# Patient Record
Sex: Male | Born: 1940
Health system: Southern US, Community
[De-identification: ages and names within clinical notes are randomized; demographics above are authoritative.]

## PROBLEM LIST (undated history)

## (undated) DIAGNOSIS — R030 Elevated blood-pressure reading, without diagnosis of hypertension: Secondary | ICD-10-CM

## (undated) DIAGNOSIS — J45909 Unspecified asthma, uncomplicated: Secondary | ICD-10-CM

## (undated) DIAGNOSIS — IMO0001 Reserved for inherently not codable concepts without codable children: Secondary | ICD-10-CM

## (undated) DIAGNOSIS — Z860100 Personal history of colon polyps, unspecified: Secondary | ICD-10-CM

## (undated) DIAGNOSIS — H409 Unspecified glaucoma: Secondary | ICD-10-CM

## (undated) DIAGNOSIS — R0600 Dyspnea, unspecified: Secondary | ICD-10-CM

## (undated) DIAGNOSIS — D72819 Decreased white blood cell count, unspecified: Secondary | ICD-10-CM

## (undated) DIAGNOSIS — Z860101 Personal history of adenomatous and serrated colon polyps: Secondary | ICD-10-CM

## (undated) DIAGNOSIS — K219 Gastro-esophageal reflux disease without esophagitis: Secondary | ICD-10-CM

## (undated) DIAGNOSIS — N400 Enlarged prostate without lower urinary tract symptoms: Secondary | ICD-10-CM

## (undated) DIAGNOSIS — E049 Nontoxic goiter, unspecified: Secondary | ICD-10-CM

## (undated) DIAGNOSIS — I10 Essential (primary) hypertension: Secondary | ICD-10-CM

## (undated) DIAGNOSIS — H269 Unspecified cataract: Secondary | ICD-10-CM

## (undated) DIAGNOSIS — D1803 Hemangioma of intra-abdominal structures: Secondary | ICD-10-CM

## (undated) HISTORY — DX: Nontoxic goiter, unspecified: E04.9

## (undated) HISTORY — DX: Personal history of colon polyps, unspecified: Z86.0100

## (undated) HISTORY — DX: Decreased white blood cell count, unspecified: D72.819

## (undated) HISTORY — DX: Unspecified cataract: H26.9

## (undated) HISTORY — PX: ESOPHAGOGASTRODUODENOSCOPY: SHX1529

## (undated) HISTORY — DX: Unspecified glaucoma: H40.9

## (undated) HISTORY — DX: Personal history of colonic polyps: Z86.010

## (undated) HISTORY — DX: Benign prostatic hyperplasia without lower urinary tract symptoms: N40.0

## (undated) HISTORY — PX: COLONOSCOPY W/ POLYPECTOMY: SHX1380

## (undated) HISTORY — DX: Gastro-esophageal reflux disease without esophagitis: K21.9

## (undated) HISTORY — DX: Elevated blood-pressure reading, without diagnosis of hypertension: R03.0

## (undated) HISTORY — DX: Reserved for inherently not codable concepts without codable children: IMO0001

---

## 1966-10-09 HISTORY — PX: THYROID SURGERY: SHX805

## 2000-10-09 HISTORY — PX: BACK SURGERY: SHX140

## 2007-10-10 HISTORY — PX: EYE SURGERY: SHX253

## 2008-10-27 ENCOUNTER — Emergency Department: Payer: Self-pay | Admitting: Emergency Medicine

## 2010-07-06 ENCOUNTER — Ambulatory Visit: Payer: Self-pay | Admitting: Unknown Physician Specialty

## 2010-07-11 LAB — PATHOLOGY REPORT

## 2011-10-11 DIAGNOSIS — N401 Enlarged prostate with lower urinary tract symptoms: Secondary | ICD-10-CM | POA: Diagnosis not present

## 2011-10-11 DIAGNOSIS — R972 Elevated prostate specific antigen [PSA]: Secondary | ICD-10-CM | POA: Diagnosis not present

## 2011-10-11 DIAGNOSIS — R310 Gross hematuria: Secondary | ICD-10-CM | POA: Diagnosis not present

## 2011-10-11 DIAGNOSIS — R339 Retention of urine, unspecified: Secondary | ICD-10-CM | POA: Diagnosis not present

## 2011-10-19 ENCOUNTER — Ambulatory Visit: Payer: Self-pay | Admitting: Urology

## 2011-10-19 DIAGNOSIS — N281 Cyst of kidney, acquired: Secondary | ICD-10-CM | POA: Diagnosis not present

## 2011-10-19 DIAGNOSIS — N400 Enlarged prostate without lower urinary tract symptoms: Secondary | ICD-10-CM | POA: Diagnosis not present

## 2011-10-19 DIAGNOSIS — R310 Gross hematuria: Secondary | ICD-10-CM | POA: Diagnosis not present

## 2011-11-20 DIAGNOSIS — K219 Gastro-esophageal reflux disease without esophagitis: Secondary | ICD-10-CM | POA: Diagnosis not present

## 2011-11-21 DIAGNOSIS — K219 Gastro-esophageal reflux disease without esophagitis: Secondary | ICD-10-CM | POA: Diagnosis not present

## 2011-11-21 DIAGNOSIS — N400 Enlarged prostate without lower urinary tract symptoms: Secondary | ICD-10-CM | POA: Diagnosis not present

## 2011-11-21 DIAGNOSIS — E049 Nontoxic goiter, unspecified: Secondary | ICD-10-CM | POA: Diagnosis not present

## 2011-11-21 DIAGNOSIS — H409 Unspecified glaucoma: Secondary | ICD-10-CM | POA: Diagnosis not present

## 2011-11-22 DIAGNOSIS — N400 Enlarged prostate without lower urinary tract symptoms: Secondary | ICD-10-CM | POA: Diagnosis not present

## 2011-11-22 DIAGNOSIS — E039 Hypothyroidism, unspecified: Secondary | ICD-10-CM | POA: Diagnosis not present

## 2011-11-27 DIAGNOSIS — H02209 Unspecified lagophthalmos unspecified eye, unspecified eyelid: Secondary | ICD-10-CM | POA: Diagnosis not present

## 2011-12-04 DIAGNOSIS — M129 Arthropathy, unspecified: Secondary | ICD-10-CM | POA: Diagnosis not present

## 2011-12-04 DIAGNOSIS — K2990 Gastroduodenitis, unspecified, without bleeding: Secondary | ICD-10-CM | POA: Diagnosis not present

## 2011-12-04 DIAGNOSIS — I10 Essential (primary) hypertension: Secondary | ICD-10-CM | POA: Diagnosis not present

## 2011-12-04 DIAGNOSIS — K2970 Gastritis, unspecified, without bleeding: Secondary | ICD-10-CM | POA: Diagnosis not present

## 2011-12-11 DIAGNOSIS — N400 Enlarged prostate without lower urinary tract symptoms: Secondary | ICD-10-CM | POA: Diagnosis not present

## 2011-12-19 DIAGNOSIS — K2940 Chronic atrophic gastritis without bleeding: Secondary | ICD-10-CM | POA: Diagnosis not present

## 2011-12-19 DIAGNOSIS — R198 Other specified symptoms and signs involving the digestive system and abdomen: Secondary | ICD-10-CM | POA: Diagnosis not present

## 2012-01-01 DIAGNOSIS — I10 Essential (primary) hypertension: Secondary | ICD-10-CM | POA: Diagnosis not present

## 2012-01-01 DIAGNOSIS — R079 Chest pain, unspecified: Secondary | ICD-10-CM | POA: Diagnosis not present

## 2012-01-03 DIAGNOSIS — H43399 Other vitreous opacities, unspecified eye: Secondary | ICD-10-CM | POA: Diagnosis not present

## 2012-01-03 DIAGNOSIS — H25099 Other age-related incipient cataract, unspecified eye: Secondary | ICD-10-CM | POA: Diagnosis not present

## 2012-01-03 DIAGNOSIS — H4011X0 Primary open-angle glaucoma, stage unspecified: Secondary | ICD-10-CM | POA: Diagnosis not present

## 2012-01-10 DIAGNOSIS — R972 Elevated prostate specific antigen [PSA]: Secondary | ICD-10-CM | POA: Diagnosis not present

## 2012-01-10 DIAGNOSIS — N138 Other obstructive and reflux uropathy: Secondary | ICD-10-CM | POA: Diagnosis not present

## 2012-01-10 DIAGNOSIS — R339 Retention of urine, unspecified: Secondary | ICD-10-CM | POA: Diagnosis not present

## 2012-01-10 DIAGNOSIS — R310 Gross hematuria: Secondary | ICD-10-CM | POA: Diagnosis not present

## 2012-04-05 DIAGNOSIS — H4010X0 Unspecified open-angle glaucoma, stage unspecified: Secondary | ICD-10-CM | POA: Diagnosis not present

## 2012-07-24 DIAGNOSIS — R339 Retention of urine, unspecified: Secondary | ICD-10-CM | POA: Insufficient documentation

## 2012-07-24 DIAGNOSIS — N529 Male erectile dysfunction, unspecified: Secondary | ICD-10-CM | POA: Insufficient documentation

## 2012-07-24 DIAGNOSIS — R972 Elevated prostate specific antigen [PSA]: Secondary | ICD-10-CM | POA: Insufficient documentation

## 2012-07-24 DIAGNOSIS — R310 Gross hematuria: Secondary | ICD-10-CM | POA: Diagnosis not present

## 2012-07-24 DIAGNOSIS — N138 Other obstructive and reflux uropathy: Secondary | ICD-10-CM | POA: Diagnosis not present

## 2012-07-24 DIAGNOSIS — N401 Enlarged prostate with lower urinary tract symptoms: Secondary | ICD-10-CM | POA: Diagnosis not present

## 2012-08-02 DIAGNOSIS — J301 Allergic rhinitis due to pollen: Secondary | ICD-10-CM | POA: Diagnosis not present

## 2012-08-02 DIAGNOSIS — H6120 Impacted cerumen, unspecified ear: Secondary | ICD-10-CM | POA: Diagnosis not present

## 2012-08-02 DIAGNOSIS — H9319 Tinnitus, unspecified ear: Secondary | ICD-10-CM | POA: Diagnosis not present

## 2012-08-30 DIAGNOSIS — H0019 Chalazion unspecified eye, unspecified eyelid: Secondary | ICD-10-CM | POA: Diagnosis not present

## 2012-09-26 DIAGNOSIS — H4010X0 Unspecified open-angle glaucoma, stage unspecified: Secondary | ICD-10-CM | POA: Diagnosis not present

## 2012-10-22 DIAGNOSIS — R12 Heartburn: Secondary | ICD-10-CM | POA: Diagnosis not present

## 2012-11-22 DIAGNOSIS — H04129 Dry eye syndrome of unspecified lacrimal gland: Secondary | ICD-10-CM | POA: Diagnosis not present

## 2012-11-25 DIAGNOSIS — N400 Enlarged prostate without lower urinary tract symptoms: Secondary | ICD-10-CM | POA: Diagnosis not present

## 2012-11-25 DIAGNOSIS — Z0000 Encounter for general adult medical examination without abnormal findings: Secondary | ICD-10-CM | POA: Diagnosis not present

## 2012-11-25 DIAGNOSIS — J392 Other diseases of pharynx: Secondary | ICD-10-CM | POA: Diagnosis not present

## 2012-11-25 DIAGNOSIS — E079 Disorder of thyroid, unspecified: Secondary | ICD-10-CM | POA: Diagnosis not present

## 2012-11-25 DIAGNOSIS — H409 Unspecified glaucoma: Secondary | ICD-10-CM | POA: Diagnosis not present

## 2012-11-25 DIAGNOSIS — D709 Neutropenia, unspecified: Secondary | ICD-10-CM | POA: Diagnosis not present

## 2012-12-04 DIAGNOSIS — L723 Sebaceous cyst: Secondary | ICD-10-CM | POA: Diagnosis not present

## 2013-01-08 DIAGNOSIS — H4011X0 Primary open-angle glaucoma, stage unspecified: Secondary | ICD-10-CM | POA: Diagnosis not present

## 2013-01-08 DIAGNOSIS — H43399 Other vitreous opacities, unspecified eye: Secondary | ICD-10-CM | POA: Diagnosis not present

## 2013-01-20 DIAGNOSIS — K120 Recurrent oral aphthae: Secondary | ICD-10-CM | POA: Diagnosis not present

## 2013-01-20 DIAGNOSIS — J301 Allergic rhinitis due to pollen: Secondary | ICD-10-CM | POA: Diagnosis not present

## 2013-01-20 DIAGNOSIS — J060 Acute laryngopharyngitis: Secondary | ICD-10-CM | POA: Diagnosis not present

## 2013-02-03 DIAGNOSIS — J060 Acute laryngopharyngitis: Secondary | ICD-10-CM | POA: Diagnosis not present

## 2013-02-03 DIAGNOSIS — J301 Allergic rhinitis due to pollen: Secondary | ICD-10-CM | POA: Diagnosis not present

## 2013-02-12 DIAGNOSIS — K219 Gastro-esophageal reflux disease without esophagitis: Secondary | ICD-10-CM | POA: Diagnosis not present

## 2013-03-04 ENCOUNTER — Ambulatory Visit: Payer: Self-pay | Admitting: Gastroenterology

## 2013-03-04 DIAGNOSIS — N400 Enlarged prostate without lower urinary tract symptoms: Secondary | ICD-10-CM | POA: Diagnosis not present

## 2013-03-04 DIAGNOSIS — H409 Unspecified glaucoma: Secondary | ICD-10-CM | POA: Diagnosis not present

## 2013-03-04 DIAGNOSIS — K2900 Acute gastritis without bleeding: Secondary | ICD-10-CM | POA: Diagnosis not present

## 2013-03-04 DIAGNOSIS — K2990 Gastroduodenitis, unspecified, without bleeding: Secondary | ICD-10-CM | POA: Diagnosis not present

## 2013-03-04 DIAGNOSIS — Z79899 Other long term (current) drug therapy: Secondary | ICD-10-CM | POA: Diagnosis not present

## 2013-03-04 DIAGNOSIS — R12 Heartburn: Secondary | ICD-10-CM | POA: Diagnosis not present

## 2013-03-04 DIAGNOSIS — K2970 Gastritis, unspecified, without bleeding: Secondary | ICD-10-CM | POA: Diagnosis not present

## 2013-03-04 DIAGNOSIS — H9190 Unspecified hearing loss, unspecified ear: Secondary | ICD-10-CM | POA: Diagnosis not present

## 2013-03-04 DIAGNOSIS — Z8249 Family history of ischemic heart disease and other diseases of the circulatory system: Secondary | ICD-10-CM | POA: Diagnosis not present

## 2013-03-04 DIAGNOSIS — Z8489 Family history of other specified conditions: Secondary | ICD-10-CM | POA: Diagnosis not present

## 2013-03-05 LAB — PATHOLOGY REPORT

## 2013-03-27 DIAGNOSIS — H4010X0 Unspecified open-angle glaucoma, stage unspecified: Secondary | ICD-10-CM | POA: Diagnosis not present

## 2013-05-08 DIAGNOSIS — D1039 Benign neoplasm of other parts of mouth: Secondary | ICD-10-CM | POA: Diagnosis not present

## 2013-05-08 DIAGNOSIS — J060 Acute laryngopharyngitis: Secondary | ICD-10-CM | POA: Diagnosis not present

## 2013-05-08 DIAGNOSIS — D367 Benign neoplasm of other specified sites: Secondary | ICD-10-CM | POA: Diagnosis not present

## 2013-05-08 DIAGNOSIS — K1370 Unspecified lesions of oral mucosa: Secondary | ICD-10-CM | POA: Diagnosis not present

## 2013-05-26 DIAGNOSIS — R220 Localized swelling, mass and lump, head: Secondary | ICD-10-CM | POA: Diagnosis not present

## 2013-05-26 DIAGNOSIS — L723 Sebaceous cyst: Secondary | ICD-10-CM | POA: Diagnosis not present

## 2013-06-02 DIAGNOSIS — D1039 Benign neoplasm of other parts of mouth: Secondary | ICD-10-CM | POA: Diagnosis not present

## 2013-06-02 DIAGNOSIS — K120 Recurrent oral aphthae: Secondary | ICD-10-CM | POA: Diagnosis not present

## 2013-06-19 DIAGNOSIS — N138 Other obstructive and reflux uropathy: Secondary | ICD-10-CM | POA: Diagnosis not present

## 2013-06-19 DIAGNOSIS — T8389XA Other specified complication of genitourinary prosthetic devices, implants and grafts, initial encounter: Secondary | ICD-10-CM | POA: Diagnosis not present

## 2013-06-19 DIAGNOSIS — R972 Elevated prostate specific antigen [PSA]: Secondary | ICD-10-CM | POA: Diagnosis not present

## 2013-06-19 DIAGNOSIS — R339 Retention of urine, unspecified: Secondary | ICD-10-CM | POA: Diagnosis not present

## 2013-06-30 DIAGNOSIS — D709 Neutropenia, unspecified: Secondary | ICD-10-CM | POA: Diagnosis not present

## 2013-06-30 DIAGNOSIS — Z23 Encounter for immunization: Secondary | ICD-10-CM | POA: Diagnosis not present

## 2013-06-30 DIAGNOSIS — W458XXA Other foreign body or object entering through skin, initial encounter: Secondary | ICD-10-CM | POA: Diagnosis not present

## 2013-06-30 DIAGNOSIS — Z0000 Encounter for general adult medical examination without abnormal findings: Secondary | ICD-10-CM | POA: Diagnosis not present

## 2013-08-07 DIAGNOSIS — H4010X0 Unspecified open-angle glaucoma, stage unspecified: Secondary | ICD-10-CM | POA: Diagnosis not present

## 2013-10-13 DIAGNOSIS — H6120 Impacted cerumen, unspecified ear: Secondary | ICD-10-CM | POA: Diagnosis not present

## 2013-10-13 DIAGNOSIS — D106 Benign neoplasm of nasopharynx: Secondary | ICD-10-CM | POA: Diagnosis not present

## 2013-11-10 DIAGNOSIS — D1039 Benign neoplasm of other parts of mouth: Secondary | ICD-10-CM | POA: Diagnosis not present

## 2014-01-21 DIAGNOSIS — H2510 Age-related nuclear cataract, unspecified eye: Secondary | ICD-10-CM | POA: Diagnosis not present

## 2014-01-21 DIAGNOSIS — H4011X0 Primary open-angle glaucoma, stage unspecified: Secondary | ICD-10-CM | POA: Diagnosis not present

## 2014-01-21 DIAGNOSIS — H43399 Other vitreous opacities, unspecified eye: Secondary | ICD-10-CM | POA: Diagnosis not present

## 2014-01-21 DIAGNOSIS — H409 Unspecified glaucoma: Secondary | ICD-10-CM | POA: Diagnosis not present

## 2014-01-29 DIAGNOSIS — D1039 Benign neoplasm of other parts of mouth: Secondary | ICD-10-CM | POA: Diagnosis not present

## 2014-01-29 DIAGNOSIS — J301 Allergic rhinitis due to pollen: Secondary | ICD-10-CM | POA: Diagnosis not present

## 2014-02-13 DIAGNOSIS — H4010X0 Unspecified open-angle glaucoma, stage unspecified: Secondary | ICD-10-CM | POA: Diagnosis not present

## 2014-02-19 DIAGNOSIS — K219 Gastro-esophageal reflux disease without esophagitis: Secondary | ICD-10-CM | POA: Diagnosis not present

## 2014-02-19 DIAGNOSIS — D1039 Benign neoplasm of other parts of mouth: Secondary | ICD-10-CM | POA: Diagnosis not present

## 2014-03-22 DIAGNOSIS — R509 Fever, unspecified: Secondary | ICD-10-CM | POA: Diagnosis not present

## 2014-03-22 DIAGNOSIS — R5381 Other malaise: Secondary | ICD-10-CM | POA: Diagnosis not present

## 2014-03-22 DIAGNOSIS — R5383 Other fatigue: Secondary | ICD-10-CM | POA: Diagnosis not present

## 2014-03-22 DIAGNOSIS — N390 Urinary tract infection, site not specified: Secondary | ICD-10-CM | POA: Diagnosis not present

## 2014-03-22 LAB — CBC WITH DIFFERENTIAL/PLATELET
Basophil #: 0 10*3/uL (ref 0.0–0.1)
Basophil %: 0.5 %
Eosinophil #: 0.1 10*3/uL (ref 0.0–0.7)
Eosinophil %: 0.9 %
HCT: 40.3 % (ref 40.0–52.0)
HGB: 13.1 g/dL (ref 13.0–18.0)
Lymphocyte #: 0.6 10*3/uL — ABNORMAL LOW (ref 1.0–3.6)
Lymphocyte %: 9.2 %
MCH: 27.5 pg (ref 26.0–34.0)
MCHC: 32.5 g/dL (ref 32.0–36.0)
MCV: 85 fL (ref 80–100)
MONOS PCT: 8.5 %
Monocyte #: 0.6 x10 3/mm (ref 0.2–1.0)
Neutrophil #: 5.7 10*3/uL (ref 1.4–6.5)
Neutrophil %: 80.9 %
Platelet: 130 10*3/uL — ABNORMAL LOW (ref 150–440)
RBC: 4.77 10*6/uL (ref 4.40–5.90)
RDW: 15 % — ABNORMAL HIGH (ref 11.5–14.5)
WBC: 7 10*3/uL (ref 3.8–10.6)

## 2014-03-22 LAB — BASIC METABOLIC PANEL
Anion Gap: 5 — ABNORMAL LOW (ref 7–16)
BUN: 17 mg/dL (ref 7–18)
CALCIUM: 9.5 mg/dL (ref 8.5–10.1)
CHLORIDE: 103 mmol/L (ref 98–107)
Co2: 27 mmol/L (ref 21–32)
Creatinine: 1.56 mg/dL — ABNORMAL HIGH (ref 0.60–1.30)
EGFR (African American): 51 — ABNORMAL LOW
EGFR (Non-African Amer.): 44 — ABNORMAL LOW
GLUCOSE: 103 mg/dL — AB (ref 65–99)
Osmolality: 272 (ref 275–301)
Potassium: 3.8 mmol/L (ref 3.5–5.1)
Sodium: 135 mmol/L — ABNORMAL LOW (ref 136–145)

## 2014-03-22 LAB — TROPONIN I

## 2014-03-22 LAB — URINALYSIS, COMPLETE
Bilirubin,UR: NEGATIVE
Glucose,UR: NEGATIVE mg/dL (ref 0–75)
NITRITE: POSITIVE
Ph: 5 (ref 4.5–8.0)
Protein: NEGATIVE
RBC,UR: 2 /HPF (ref 0–5)
Specific Gravity: 1.016 (ref 1.003–1.030)
Squamous Epithelial: NONE SEEN

## 2014-03-23 ENCOUNTER — Inpatient Hospital Stay: Payer: Self-pay | Admitting: Internal Medicine

## 2014-03-23 DIAGNOSIS — N400 Enlarged prostate without lower urinary tract symptoms: Secondary | ICD-10-CM | POA: Diagnosis not present

## 2014-03-23 DIAGNOSIS — A419 Sepsis, unspecified organism: Secondary | ICD-10-CM | POA: Diagnosis present

## 2014-03-23 DIAGNOSIS — R5383 Other fatigue: Secondary | ICD-10-CM | POA: Diagnosis not present

## 2014-03-23 DIAGNOSIS — R5381 Other malaise: Secondary | ICD-10-CM | POA: Diagnosis not present

## 2014-03-23 DIAGNOSIS — N390 Urinary tract infection, site not specified: Secondary | ICD-10-CM | POA: Diagnosis present

## 2014-03-23 DIAGNOSIS — K219 Gastro-esophageal reflux disease without esophagitis: Secondary | ICD-10-CM | POA: Diagnosis not present

## 2014-03-23 DIAGNOSIS — H409 Unspecified glaucoma: Secondary | ICD-10-CM | POA: Diagnosis not present

## 2014-03-23 LAB — HEPATIC FUNCTION PANEL A (ARMC)
ALBUMIN: 3.7 g/dL (ref 3.4–5.0)
ALK PHOS: 57 U/L
ALT: 30 U/L (ref 12–78)
Bilirubin, Direct: 0.1 mg/dL (ref 0.00–0.20)
Bilirubin,Total: 0.6 mg/dL (ref 0.2–1.0)
SGOT(AST): 43 U/L — ABNORMAL HIGH (ref 15–37)
Total Protein: 7.2 g/dL (ref 6.4–8.2)

## 2014-03-25 LAB — URINE CULTURE

## 2014-03-28 LAB — CULTURE, BLOOD (SINGLE)

## 2014-04-15 DIAGNOSIS — N390 Urinary tract infection, site not specified: Secondary | ICD-10-CM | POA: Diagnosis not present

## 2014-04-15 DIAGNOSIS — R300 Dysuria: Secondary | ICD-10-CM | POA: Diagnosis not present

## 2014-04-16 ENCOUNTER — Emergency Department: Payer: Self-pay | Admitting: Emergency Medicine

## 2014-04-16 DIAGNOSIS — R319 Hematuria, unspecified: Secondary | ICD-10-CM | POA: Diagnosis not present

## 2014-04-16 DIAGNOSIS — Z79899 Other long term (current) drug therapy: Secondary | ICD-10-CM | POA: Diagnosis not present

## 2014-04-16 LAB — COMPREHENSIVE METABOLIC PANEL
ALK PHOS: 62 U/L
ALT: 38 U/L (ref 12–78)
Albumin: 3.5 g/dL (ref 3.4–5.0)
Anion Gap: 9 (ref 7–16)
BUN: 16 mg/dL (ref 7–18)
Bilirubin,Total: 1.2 mg/dL — ABNORMAL HIGH (ref 0.2–1.0)
CALCIUM: 9.8 mg/dL (ref 8.5–10.1)
CO2: 26 mmol/L (ref 21–32)
Chloride: 95 mmol/L — ABNORMAL LOW (ref 98–107)
Creatinine: 1.6 mg/dL — ABNORMAL HIGH (ref 0.60–1.30)
EGFR (Non-African Amer.): 42 — ABNORMAL LOW
GFR CALC AF AMER: 49 — AB
GLUCOSE: 117 mg/dL — AB (ref 65–99)
Osmolality: 263 (ref 275–301)
Potassium: 3.8 mmol/L (ref 3.5–5.1)
SGOT(AST): 42 U/L — ABNORMAL HIGH (ref 15–37)
Sodium: 130 mmol/L — ABNORMAL LOW (ref 136–145)
TOTAL PROTEIN: 7.3 g/dL (ref 6.4–8.2)

## 2014-04-16 LAB — URINALYSIS, COMPLETE
BACTERIA: NONE SEEN
Bilirubin,UR: NEGATIVE
Glucose,UR: NEGATIVE mg/dL (ref 0–75)
KETONE: NEGATIVE
LEUKOCYTE ESTERASE: NEGATIVE
Nitrite: NEGATIVE
PH: 6 (ref 4.5–8.0)
Protein: NEGATIVE
RBC,UR: 1 /HPF (ref 0–5)
SQUAMOUS EPITHELIAL: NONE SEEN
Specific Gravity: 1.002 (ref 1.003–1.030)

## 2014-04-16 LAB — CBC
HCT: 39.8 % — ABNORMAL LOW (ref 40.0–52.0)
HGB: 12.8 g/dL — AB (ref 13.0–18.0)
MCH: 26.9 pg (ref 26.0–34.0)
MCHC: 32.2 g/dL (ref 32.0–36.0)
MCV: 84 fL (ref 80–100)
Platelet: 91 10*3/uL — ABNORMAL LOW (ref 150–440)
RBC: 4.76 10*6/uL (ref 4.40–5.90)
RDW: 15.1 % — AB (ref 11.5–14.5)
WBC: 5.3 10*3/uL (ref 3.8–10.6)

## 2014-04-18 LAB — URINE CULTURE

## 2014-04-22 DIAGNOSIS — N401 Enlarged prostate with lower urinary tract symptoms: Secondary | ICD-10-CM | POA: Diagnosis not present

## 2014-04-22 DIAGNOSIS — R310 Gross hematuria: Secondary | ICD-10-CM | POA: Diagnosis not present

## 2014-04-22 DIAGNOSIS — N138 Other obstructive and reflux uropathy: Secondary | ICD-10-CM | POA: Diagnosis not present

## 2014-04-22 DIAGNOSIS — N390 Urinary tract infection, site not specified: Secondary | ICD-10-CM | POA: Diagnosis not present

## 2014-04-22 DIAGNOSIS — R339 Retention of urine, unspecified: Secondary | ICD-10-CM | POA: Diagnosis not present

## 2014-05-13 DIAGNOSIS — S90569A Insect bite (nonvenomous), unspecified ankle, initial encounter: Secondary | ICD-10-CM | POA: Diagnosis not present

## 2014-05-13 DIAGNOSIS — I10 Essential (primary) hypertension: Secondary | ICD-10-CM | POA: Diagnosis not present

## 2014-05-13 DIAGNOSIS — W57XXXA Bitten or stung by nonvenomous insect and other nonvenomous arthropods, initial encounter: Secondary | ICD-10-CM | POA: Diagnosis not present

## 2014-05-13 DIAGNOSIS — Y929 Unspecified place or not applicable: Secondary | ICD-10-CM | POA: Diagnosis not present

## 2014-05-19 DIAGNOSIS — R030 Elevated blood-pressure reading, without diagnosis of hypertension: Secondary | ICD-10-CM | POA: Diagnosis not present

## 2014-06-10 DIAGNOSIS — K2100 Gastro-esophageal reflux disease with esophagitis, without bleeding: Secondary | ICD-10-CM | POA: Diagnosis not present

## 2014-06-10 DIAGNOSIS — K5900 Constipation, unspecified: Secondary | ICD-10-CM | POA: Diagnosis not present

## 2014-06-10 DIAGNOSIS — D126 Benign neoplasm of colon, unspecified: Secondary | ICD-10-CM | POA: Diagnosis not present

## 2014-06-10 DIAGNOSIS — Z86010 Personal history of colonic polyps: Secondary | ICD-10-CM | POA: Diagnosis not present

## 2014-06-24 ENCOUNTER — Encounter (INDEPENDENT_AMBULATORY_CARE_PROVIDER_SITE_OTHER): Payer: Self-pay

## 2014-06-24 ENCOUNTER — Encounter: Payer: Self-pay | Admitting: Internal Medicine

## 2014-06-24 ENCOUNTER — Ambulatory Visit (INDEPENDENT_AMBULATORY_CARE_PROVIDER_SITE_OTHER): Payer: Medicare Other | Admitting: Internal Medicine

## 2014-06-24 VITALS — BP 110/80 | HR 53 | Temp 97.9°F | Ht 70.0 in | Wt 162.2 lb

## 2014-06-24 DIAGNOSIS — R030 Elevated blood-pressure reading, without diagnosis of hypertension: Secondary | ICD-10-CM

## 2014-06-24 DIAGNOSIS — K219 Gastro-esophageal reflux disease without esophagitis: Secondary | ICD-10-CM | POA: Diagnosis not present

## 2014-06-24 DIAGNOSIS — Z86010 Personal history of colonic polyps: Secondary | ICD-10-CM | POA: Diagnosis not present

## 2014-06-24 DIAGNOSIS — E049 Nontoxic goiter, unspecified: Secondary | ICD-10-CM | POA: Diagnosis not present

## 2014-06-24 DIAGNOSIS — D72819 Decreased white blood cell count, unspecified: Secondary | ICD-10-CM

## 2014-06-24 DIAGNOSIS — K5900 Constipation, unspecified: Secondary | ICD-10-CM

## 2014-06-24 DIAGNOSIS — IMO0001 Reserved for inherently not codable concepts without codable children: Secondary | ICD-10-CM

## 2014-06-24 DIAGNOSIS — N400 Enlarged prostate without lower urinary tract symptoms: Secondary | ICD-10-CM | POA: Diagnosis not present

## 2014-06-24 NOTE — Progress Notes (Signed)
Pre visit review using our clinic review tool, if applicable. No additional management support is needed unless otherwise documented below in the visit note. 

## 2014-06-26 DIAGNOSIS — R972 Elevated prostate specific antigen [PSA]: Secondary | ICD-10-CM | POA: Diagnosis not present

## 2014-06-26 DIAGNOSIS — N139 Obstructive and reflux uropathy, unspecified: Secondary | ICD-10-CM | POA: Diagnosis not present

## 2014-06-26 DIAGNOSIS — Z79899 Other long term (current) drug therapy: Secondary | ICD-10-CM | POA: Diagnosis not present

## 2014-06-26 DIAGNOSIS — N138 Other obstructive and reflux uropathy: Secondary | ICD-10-CM | POA: Diagnosis not present

## 2014-06-26 DIAGNOSIS — Z86010 Personal history of colonic polyps: Secondary | ICD-10-CM | POA: Insufficient documentation

## 2014-06-26 DIAGNOSIS — E119 Type 2 diabetes mellitus without complications: Secondary | ICD-10-CM | POA: Diagnosis not present

## 2014-06-26 DIAGNOSIS — N401 Enlarged prostate with lower urinary tract symptoms: Secondary | ICD-10-CM | POA: Diagnosis not present

## 2014-06-26 DIAGNOSIS — K289 Gastrojejunal ulcer, unspecified as acute or chronic, without hemorrhage or perforation: Secondary | ICD-10-CM

## 2014-06-26 DIAGNOSIS — M545 Low back pain, unspecified: Secondary | ICD-10-CM | POA: Diagnosis not present

## 2014-06-26 DIAGNOSIS — R339 Retention of urine, unspecified: Secondary | ICD-10-CM | POA: Diagnosis not present

## 2014-06-26 DIAGNOSIS — R310 Gross hematuria: Secondary | ICD-10-CM | POA: Diagnosis not present

## 2014-06-26 DIAGNOSIS — B9681 Helicobacter pylori [H. pylori] as the cause of diseases classified elsewhere: Secondary | ICD-10-CM | POA: Insufficient documentation

## 2014-06-28 ENCOUNTER — Encounter: Payer: Self-pay | Admitting: Internal Medicine

## 2014-06-28 DIAGNOSIS — K5900 Constipation, unspecified: Secondary | ICD-10-CM | POA: Insufficient documentation

## 2014-06-28 DIAGNOSIS — N400 Enlarged prostate without lower urinary tract symptoms: Secondary | ICD-10-CM | POA: Insufficient documentation

## 2014-06-28 DIAGNOSIS — D72819 Decreased white blood cell count, unspecified: Secondary | ICD-10-CM | POA: Insufficient documentation

## 2014-06-28 DIAGNOSIS — I10 Essential (primary) hypertension: Secondary | ICD-10-CM | POA: Insufficient documentation

## 2014-06-28 DIAGNOSIS — E049 Nontoxic goiter, unspecified: Secondary | ICD-10-CM | POA: Insufficient documentation

## 2014-06-28 DIAGNOSIS — K219 Gastro-esophageal reflux disease without esophagitis: Secondary | ICD-10-CM | POA: Insufficient documentation

## 2014-06-28 DIAGNOSIS — Z86010 Personal history of colonic polyps: Secondary | ICD-10-CM | POA: Insufficient documentation

## 2014-06-28 NOTE — Assessment & Plan Note (Signed)
Worked up by hematology.  Had bone marrow, etc.  W/up unrevealing.  Follow.

## 2014-06-28 NOTE — Progress Notes (Signed)
Subjective:    Patient ID: Jose Perry, male    DOB: 07/18/1941, 73 y.o.   MRN: [id number]  HPI 73 year old male with past history of GERD, leukopenia, colon polyps, enlarged prostate and thyroid goiter.  He comes in today to follow up on these issues as well as to establish care.  States moved here 5 years ago.  Has been seen at [facility].  Had a thyroid goiter.  Had 80% of thyroid removed - 30 years ago.  Was on thyroid replacement for 15 years and felt worse.  Labs checked and found to be not needed.  Blood pressure borderline previously.  Has exercised and adjusted diet (decreased salt intake).  Blood pressure doing better after this change on no meds.  Has a history of low white blood cell count.  Worked up by hematology.  Had extensive w/up including bone marrow.  Has had two uti's recently.  Was on abx.  Was constipated.  Laxative not working.  Saw Dawson Bills.  Recommended yogurt and fiber.  This has helped.  Bowels better.  Planning for colonoscopy at the end of this month.  Has had a few scopes.  Has a history of polyps.  Sees urology for his enlarged prostate.  He has been following his blood pressure.  Blood pressure in the am 140/75 and pm blood pressure 137/71-75.  He exercises daily.  Overall feels he is doing well.     Past Medical History  Diagnosis Date  . GERD (gastroesophageal reflux disease)   . Glaucoma   . Thyroid goiter   . Hx of colonic polyps   . Enlarged prostate   . Elevated blood pressure   . Leukopenia     has had an extensive w/up.      Outpatient Encounter Prescriptions as of 06/24/2014  Medication Sig  . bimatoprost (LUMIGAN) 0.03 % ophthalmic solution Place 1 drop into the right eye at bedtime.   Marland Kitchen omeprazole (PRILOSEC) 40 MG capsule Take 40 mg by mouth daily.  Marland Kitchen TAMSULOSIN HCL PO Take 0.4 mg by mouth daily.     Review of Systems Patient denies any headache, lightheadedness or dizziness.  No sinus or allergy issues.  No chest pain, tightness or  palpatations.  No increased shortness of breath, cough or congestion.  No nausea or vomiting.  No acid reflux.  No abdominal pain or cramping.  No bowel change, such as diarrhea, constipation, BRBPR or melana now.  Bowels better.  No urine change.  Exercises regularly.  Blood pressure as outlined.  Has been worked up by hematology for low wbc's.        Objective:   Physical Exam Filed Vitals:   06/24/14 1003  BP: 110/80  Pulse: 53  Temp: 97.9 F (36.6 C)   Blood pressure recheck:  17/44  73 year old male in no acute distress.  HEENT:  Nares - clear.  Oropharynx - without lesions. NECK:  Supple.  Nontender.  No audible carotid bruit.  HEART:  Appears to be regular.   LUNGS:  No crackles or wheezing audible.  Respirations even and unlabored.   RADIAL PULSE:  Equal bilaterally.  ABDOMEN:  Soft.  Nontender.  Bowel sounds present and normal.  No audible abdominal bruit.    EXTREMITIES:  No increased edema present.  DP pulses palpable and equal bilaterally.      Assessment & Plan:  HEALTH MAINTENANCE.  Schedule him for a physical.  Has had colonoscopy.  Is followed by  Dr Tiffany Kocher.  Obtain records.  Prostate and psa followed by urology.    I spent 45 minutes with the patient and more than 50% of the time was spent in consultation regarding the above.

## 2014-06-28 NOTE — Assessment & Plan Note (Signed)
Had 80% of thyroid removed.  On no medications.  Follow tsh.

## 2014-06-28 NOTE — Assessment & Plan Note (Signed)
Followed by urology.  Currently doing well.

## 2014-06-28 NOTE — Assessment & Plan Note (Signed)
Reflux controlled on no medications.  Follow.

## 2014-06-28 NOTE — Assessment & Plan Note (Addendum)
Blood pressure as outlined.  Elevated.  Have him spot check his pressure.  Continue diet adjustment and exercise.  Follow.  Get him back in soon to reassess.

## 2014-06-28 NOTE — Assessment & Plan Note (Signed)
Better since starting fiber and eating yogurt.  Planning for colonoscopy in 9/15.

## 2014-06-28 NOTE — Assessment & Plan Note (Signed)
Followed by Dr Tiffany Kocher.  Up to date.

## 2014-07-06 ENCOUNTER — Telehealth: Payer: Self-pay | Admitting: *Deleted

## 2014-07-06 ENCOUNTER — Other Ambulatory Visit: Payer: Self-pay | Admitting: Internal Medicine

## 2014-07-06 NOTE — Telephone Encounter (Signed)
Pt states that he dropped off BP readings on Friday. He is scheduled for a colonoscopy on Wed. morning & was told that if his BP was high the morning of, they would cancel the procedure. Pt wants to know if you feels that he should go ahead & cancel now based on his BP readings. He states that his BP is always high in the mornings. This mornings reading was: 156/91 & today @ 2pm: 134/75. Please advise

## 2014-07-06 NOTE — Telephone Encounter (Signed)
Pt.notified

## 2014-07-06 NOTE — Telephone Encounter (Signed)
I spoke to GI.  I reviewed blood pressures with them  If this is the highest, this should not keep him from having his colonoscopy.  They will monitor his blood pressure.  They did ask if he would bring a list of his readings (like he gave to me) - to the appt.  Let me know if any other questions.

## 2014-07-08 ENCOUNTER — Ambulatory Visit: Payer: Self-pay | Admitting: Unknown Physician Specialty

## 2014-07-08 DIAGNOSIS — N400 Enlarged prostate without lower urinary tract symptoms: Secondary | ICD-10-CM | POA: Diagnosis not present

## 2014-07-08 DIAGNOSIS — K5900 Constipation, unspecified: Secondary | ICD-10-CM | POA: Diagnosis not present

## 2014-07-08 DIAGNOSIS — D126 Benign neoplasm of colon, unspecified: Secondary | ICD-10-CM | POA: Diagnosis not present

## 2014-07-08 DIAGNOSIS — Z860100 Personal history of colon polyps, unspecified: Secondary | ICD-10-CM | POA: Diagnosis not present

## 2014-07-08 DIAGNOSIS — R198 Other specified symptoms and signs involving the digestive system and abdomen: Secondary | ICD-10-CM | POA: Diagnosis not present

## 2014-07-08 DIAGNOSIS — H409 Unspecified glaucoma: Secondary | ICD-10-CM | POA: Diagnosis not present

## 2014-07-08 DIAGNOSIS — K648 Other hemorrhoids: Secondary | ICD-10-CM | POA: Diagnosis not present

## 2014-07-08 DIAGNOSIS — Z09 Encounter for follow-up examination after completed treatment for conditions other than malignant neoplasm: Secondary | ICD-10-CM | POA: Diagnosis not present

## 2014-07-08 DIAGNOSIS — K5730 Diverticulosis of large intestine without perforation or abscess without bleeding: Secondary | ICD-10-CM | POA: Diagnosis not present

## 2014-07-08 LAB — HM COLONOSCOPY: HM Colonoscopy: 2

## 2014-07-11 LAB — PATHOLOGY REPORT

## 2014-07-15 ENCOUNTER — Encounter: Payer: Self-pay | Admitting: Internal Medicine

## 2014-08-06 ENCOUNTER — Ambulatory Visit (INDEPENDENT_AMBULATORY_CARE_PROVIDER_SITE_OTHER): Payer: Medicare Other | Admitting: Internal Medicine

## 2014-08-06 ENCOUNTER — Encounter: Payer: Self-pay | Admitting: Internal Medicine

## 2014-08-06 VITALS — BP 120/80 | HR 61 | Temp 98.0°F | Ht 70.0 in | Wt 163.5 lb

## 2014-08-06 DIAGNOSIS — K219 Gastro-esophageal reflux disease without esophagitis: Secondary | ICD-10-CM | POA: Diagnosis not present

## 2014-08-06 DIAGNOSIS — IMO0001 Reserved for inherently not codable concepts without codable children: Secondary | ICD-10-CM

## 2014-08-06 DIAGNOSIS — N400 Enlarged prostate without lower urinary tract symptoms: Secondary | ICD-10-CM | POA: Diagnosis not present

## 2014-08-06 DIAGNOSIS — R030 Elevated blood-pressure reading, without diagnosis of hypertension: Secondary | ICD-10-CM | POA: Diagnosis not present

## 2014-08-06 DIAGNOSIS — Z86010 Personal history of colonic polyps: Secondary | ICD-10-CM | POA: Diagnosis not present

## 2014-08-06 DIAGNOSIS — D72819 Decreased white blood cell count, unspecified: Secondary | ICD-10-CM

## 2014-08-06 NOTE — Progress Notes (Signed)
Pre visit review using our clinic review tool, if applicable. No additional management support is needed unless otherwise documented below in the visit note. 

## 2014-08-10 ENCOUNTER — Telehealth: Payer: Self-pay | Admitting: *Deleted

## 2014-08-10 DIAGNOSIS — E049 Nontoxic goiter, unspecified: Secondary | ICD-10-CM

## 2014-08-10 DIAGNOSIS — D72819 Decreased white blood cell count, unspecified: Secondary | ICD-10-CM

## 2014-08-10 DIAGNOSIS — Z13220 Encounter for screening for lipoid disorders: Secondary | ICD-10-CM

## 2014-08-10 DIAGNOSIS — N400 Enlarged prostate without lower urinary tract symptoms: Secondary | ICD-10-CM

## 2014-08-10 DIAGNOSIS — K1233 Oral mucositis (ulcerative) due to radiation: Secondary | ICD-10-CM | POA: Diagnosis not present

## 2014-08-10 DIAGNOSIS — IMO0001 Reserved for inherently not codable concepts without codable children: Secondary | ICD-10-CM

## 2014-08-10 DIAGNOSIS — K219 Gastro-esophageal reflux disease without esophagitis: Secondary | ICD-10-CM

## 2014-08-10 DIAGNOSIS — R030 Elevated blood-pressure reading, without diagnosis of hypertension: Secondary | ICD-10-CM

## 2014-08-10 DIAGNOSIS — H6123 Impacted cerumen, bilateral: Secondary | ICD-10-CM | POA: Diagnosis not present

## 2014-08-10 NOTE — Telephone Encounter (Signed)
Pt is coming in tomorrow what labs and dx?  

## 2014-08-10 NOTE — Telephone Encounter (Signed)
Orders placed for labs

## 2014-08-11 ENCOUNTER — Other Ambulatory Visit (INDEPENDENT_AMBULATORY_CARE_PROVIDER_SITE_OTHER): Payer: Medicare Other

## 2014-08-11 DIAGNOSIS — E049 Nontoxic goiter, unspecified: Secondary | ICD-10-CM

## 2014-08-11 DIAGNOSIS — IMO0001 Reserved for inherently not codable concepts without codable children: Secondary | ICD-10-CM

## 2014-08-11 DIAGNOSIS — N400 Enlarged prostate without lower urinary tract symptoms: Secondary | ICD-10-CM | POA: Diagnosis not present

## 2014-08-11 DIAGNOSIS — D72819 Decreased white blood cell count, unspecified: Secondary | ICD-10-CM | POA: Diagnosis not present

## 2014-08-11 DIAGNOSIS — R030 Elevated blood-pressure reading, without diagnosis of hypertension: Secondary | ICD-10-CM

## 2014-08-11 DIAGNOSIS — Z13220 Encounter for screening for lipoid disorders: Secondary | ICD-10-CM

## 2014-08-11 LAB — CBC WITH DIFFERENTIAL/PLATELET
BASOS ABS: 0 10*3/uL (ref 0.0–0.1)
BASOS PCT: 0.9 % (ref 0.0–3.0)
EOS ABS: 0.2 10*3/uL (ref 0.0–0.7)
Eosinophils Relative: 7.1 % — ABNORMAL HIGH (ref 0.0–5.0)
HCT: 41.9 % (ref 39.0–52.0)
HEMOGLOBIN: 13.6 g/dL (ref 13.0–17.0)
LYMPHS ABS: 1 10*3/uL (ref 0.7–4.0)
Lymphocytes Relative: 39.2 % (ref 12.0–46.0)
MCHC: 32.4 g/dL (ref 30.0–36.0)
MCV: 83.9 fl (ref 78.0–100.0)
Monocytes Absolute: 0.2 10*3/uL (ref 0.1–1.0)
Monocytes Relative: 8.8 % (ref 3.0–12.0)
NEUTROS ABS: 1.1 10*3/uL — AB (ref 1.4–7.7)
Neutrophils Relative %: 44 % (ref 43.0–77.0)
Platelets: 129 10*3/uL — ABNORMAL LOW (ref 150.0–400.0)
RBC: 4.99 Mil/uL (ref 4.22–5.81)
RDW: 15 % (ref 11.5–15.5)
WBC: 2.6 10*3/uL — ABNORMAL LOW (ref 4.0–10.5)

## 2014-08-11 LAB — COMPREHENSIVE METABOLIC PANEL
ALT: 25 U/L (ref 0–53)
AST: 40 U/L — ABNORMAL HIGH (ref 0–37)
Albumin: 3.7 g/dL (ref 3.5–5.2)
Alkaline Phosphatase: 60 U/L (ref 39–117)
BILIRUBIN TOTAL: 0.5 mg/dL (ref 0.2–1.2)
BUN: 18 mg/dL (ref 6–23)
CO2: 24 mEq/L (ref 19–32)
Calcium: 9.6 mg/dL (ref 8.4–10.5)
Chloride: 109 mEq/L (ref 96–112)
Creatinine, Ser: 1.4 mg/dL (ref 0.4–1.5)
GFR: 62.42 mL/min (ref >60.00)
Glucose, Bld: 82 mg/dL (ref 70–99)
Potassium: 4.3 mEq/L (ref 3.5–5.1)
Sodium: 142 mEq/L (ref 135–145)
Total Protein: 6.7 g/dL (ref 6.0–8.3)

## 2014-08-11 LAB — LIPID PANEL
CHOL/HDL RATIO: 2
CHOLESTEROL: 196 mg/dL (ref 0–200)
HDL: 84.2 mg/dL (ref >39.00)
LDL Cholesterol: 107 mg/dL — ABNORMAL HIGH (ref 0–99)
NonHDL: 111.8
TRIGLYCERIDES: 22 mg/dL (ref 0.0–149.0)
VLDL: 4.4 mg/dL (ref 0.0–40.0)

## 2014-08-11 LAB — TSH: TSH: 1.95 u[IU]/mL (ref 0.35–4.50)

## 2014-08-11 LAB — PSA, MEDICARE: PSA: 2.75 ng/ml (ref 0.10–4.00)

## 2014-08-12 ENCOUNTER — Other Ambulatory Visit: Payer: Self-pay | Admitting: Internal Medicine

## 2014-08-12 DIAGNOSIS — R7989 Other specified abnormal findings of blood chemistry: Secondary | ICD-10-CM

## 2014-08-12 DIAGNOSIS — D696 Thrombocytopenia, unspecified: Secondary | ICD-10-CM

## 2014-08-12 DIAGNOSIS — D72819 Decreased white blood cell count, unspecified: Secondary | ICD-10-CM

## 2014-08-12 DIAGNOSIS — R945 Abnormal results of liver function studies: Secondary | ICD-10-CM

## 2014-08-12 NOTE — Progress Notes (Signed)
Order placed for f/u labs.  

## 2014-08-13 DIAGNOSIS — H4011X3 Primary open-angle glaucoma, severe stage: Secondary | ICD-10-CM | POA: Diagnosis not present

## 2014-08-15 ENCOUNTER — Encounter: Payer: Self-pay | Admitting: Internal Medicine

## 2014-08-15 NOTE — Progress Notes (Signed)
Subjective:    Patient ID: Jose Perry, male    DOB: April 17, 1941, 73 y.o.   MRN: [id number]  HPI 73 year old male with past history of GERD, leukopenia, colon polyps, enlarged prostate and thyroid goiter.  He comes in today for a scheduled follow up.  Had a thyroid goiter.  Had 80% of thyroid removed - 30 years ago.  Was on thyroid replacement for 15 years and felt worse.  Labs checked and found to be not needed.  Blood pressure has been borderline previously.  Elevated last visit.  Comes in today to f/u on his blood pressure. See attached list.   Has a history of low white blood cell count.  Worked up by hematology.  Had extensive w/up including bone marrow.   Bowels better.  Has a history of polyps.  Sees urology for his enlarged prostate.      Past Medical History  Diagnosis Date  . GERD (gastroesophageal reflux disease)   . Glaucoma   . Thyroid goiter   . Hx of colonic polyps   . Enlarged prostate   . Elevated blood pressure   . Leukopenia     has had an extensive w/up.      Outpatient Encounter Prescriptions as of 08/06/2014  Medication Sig  . bimatoprost (LUMIGAN) 0.03 % ophthalmic solution Place 1 drop into the right eye at bedtime.   Marland Kitchen omeprazole (PRILOSEC) 40 MG capsule Take 40 mg by mouth daily.  Marland Kitchen TAMSULOSIN HCL PO Take 0.4 mg by mouth daily.     Review of Systems Patient denies any headache, lightheadedness or dizziness.  No sinus or allergy issues.  No chest pain, tightness or palpatations.  No increased shortness of breath, cough or congestion.  No nausea or vomiting.  No acid reflux.  No abdominal pain or cramping.  No bowel change, such as diarrhea, constipation, BRBPR or melana now.  Bowels better.  No urine change.  Exercises regularly.  Blood pressures attached.  Recently blood pressures better - averaging 130-140/70s.   Has been worked up by hematology for low wbc's. Previous sciatic pain better.         Objective:   Physical Exam  Filed Vitals:   08/06/14  1335  BP: 120/80  Pulse: 61  Temp: 98 F (82.58 C)   73 year old male in no acute distress.  HEENT:  Nares - clear.  Oropharynx - without lesions. NECK:  Supple.  Nontender.  No audible carotid bruit.  HEART:  Appears to be regular.   LUNGS:  No crackles or wheezing audible.  Respirations even and unlabored.   RADIAL PULSE:  Equal bilaterally.  ABDOMEN:  Soft.  Nontender.  Bowel sounds present and normal.  No audible abdominal bruit.    EXTREMITIES:  No increased edema present.  DP pulses palpable and equal bilaterally.      Assessment & Plan:  1. Gastroesophageal reflux disease, esophagitis presence not specified Controlled on omeprazole.  Follow.   2. Elevated blood pressure Blood pressure as outlined.  Doing better.  Follow.   3. Leukopenia Has been worked up extensively.  See above.  Recheck cbc.    4. History of colonic polyps Colonoscopy as outlined.  See above.  Recommended f/u colonoscopy 06/2019.    5. Enlarged prostate Followed by urology.    HEALTH MAINTENANCE.  Schedule him for a physical.  Has had colonoscopy.  Is followed by Dr Tiffany Kocher.  Colonoscopy 07/08/14 - two small polyps and internal hemorrhoids. Recommended  f/u 06/2019.   Prostate and psa followed by urology.    I spent 25 minutes with the patient and more than 50% of the time was spent in consultation regarding the above.

## 2014-08-24 ENCOUNTER — Telehealth: Payer: Self-pay | Admitting: *Deleted

## 2014-08-24 NOTE — Telephone Encounter (Signed)
Pt called states yesterday his BP was 150/87 and this morning his BP is 160/94.  Pt further states he is out of town and will return on Wed. 08/26/14 unless he needs to be seen.  Please advise

## 2014-08-24 NOTE — Telephone Encounter (Signed)
Confirm he is doing ok - no acute problems.  Have him continue to monitor bp.  It had been better.  I can see him on 08/26/14 12:30 - work in for blood pressure.  If any acute problems or issues, I recommend evaluation wherever he is.

## 2014-08-24 NOTE — Telephone Encounter (Signed)
Spoke with pt, he is not having any acute issues just elevated BP in the mornings.  Appoint scheduled.

## 2014-08-26 ENCOUNTER — Encounter: Payer: Self-pay | Admitting: Internal Medicine

## 2014-08-26 ENCOUNTER — Encounter (INDEPENDENT_AMBULATORY_CARE_PROVIDER_SITE_OTHER): Payer: Self-pay

## 2014-08-26 ENCOUNTER — Ambulatory Visit (INDEPENDENT_AMBULATORY_CARE_PROVIDER_SITE_OTHER): Payer: Medicare Other | Admitting: Internal Medicine

## 2014-08-26 ENCOUNTER — Other Ambulatory Visit (INDEPENDENT_AMBULATORY_CARE_PROVIDER_SITE_OTHER): Payer: Medicare Other

## 2014-08-26 VITALS — BP 118/78 | HR 56 | Temp 97.6°F | Ht 70.0 in | Wt 163.2 lb

## 2014-08-26 DIAGNOSIS — R7989 Other specified abnormal findings of blood chemistry: Secondary | ICD-10-CM

## 2014-08-26 DIAGNOSIS — K219 Gastro-esophageal reflux disease without esophagitis: Secondary | ICD-10-CM | POA: Diagnosis not present

## 2014-08-26 DIAGNOSIS — IMO0001 Reserved for inherently not codable concepts without codable children: Secondary | ICD-10-CM

## 2014-08-26 DIAGNOSIS — D696 Thrombocytopenia, unspecified: Secondary | ICD-10-CM

## 2014-08-26 DIAGNOSIS — D72819 Decreased white blood cell count, unspecified: Secondary | ICD-10-CM | POA: Diagnosis not present

## 2014-08-26 DIAGNOSIS — N400 Enlarged prostate without lower urinary tract symptoms: Secondary | ICD-10-CM

## 2014-08-26 DIAGNOSIS — R030 Elevated blood-pressure reading, without diagnosis of hypertension: Secondary | ICD-10-CM

## 2014-08-26 DIAGNOSIS — R945 Abnormal results of liver function studies: Secondary | ICD-10-CM

## 2014-08-26 LAB — HEPATIC FUNCTION PANEL
ALBUMIN: 4.3 g/dL (ref 3.5–5.2)
ALT: 28 U/L (ref 0–53)
AST: 46 U/L — ABNORMAL HIGH (ref 0–37)
Alkaline Phosphatase: 64 U/L (ref 39–117)
BILIRUBIN DIRECT: 0.1 mg/dL (ref 0.0–0.3)
Total Bilirubin: 0.8 mg/dL (ref 0.2–1.2)
Total Protein: 6.9 g/dL (ref 6.0–8.3)

## 2014-08-26 LAB — CBC WITH DIFFERENTIAL/PLATELET
BASOS PCT: 0.7 % (ref 0.0–3.0)
Basophils Absolute: 0 10*3/uL (ref 0.0–0.1)
Eosinophils Absolute: 0.2 10*3/uL (ref 0.0–0.7)
Eosinophils Relative: 5.9 % — ABNORMAL HIGH (ref 0.0–5.0)
HEMATOCRIT: 43.4 % (ref 39.0–52.0)
Hemoglobin: 13.9 g/dL (ref 13.0–17.0)
Lymphocytes Relative: 40.5 % (ref 12.0–46.0)
Lymphs Abs: 1.3 10*3/uL (ref 0.7–4.0)
MCHC: 32 g/dL (ref 30.0–36.0)
MCV: 83.8 fl (ref 78.0–100.0)
MONO ABS: 0.3 10*3/uL (ref 0.1–1.0)
Monocytes Relative: 8.5 % (ref 3.0–12.0)
Neutro Abs: 1.4 10*3/uL (ref 1.4–7.7)
Neutrophils Relative %: 44.4 % (ref 43.0–77.0)
Platelets: 139 10*3/uL — ABNORMAL LOW (ref 150.0–400.0)
RBC: 5.18 Mil/uL (ref 4.22–5.81)
RDW: 14.9 % (ref 11.5–15.5)
WBC: 3.2 10*3/uL — AB (ref 4.0–10.5)

## 2014-08-26 LAB — CREATININE, SERUM: Creatinine, Ser: 1.3 mg/dL (ref 0.4–1.5)

## 2014-08-26 NOTE — Progress Notes (Signed)
Pre visit review using our clinic review tool, if applicable. No additional management support is needed unless otherwise documented below in the visit note. 

## 2014-08-27 ENCOUNTER — Other Ambulatory Visit: Payer: Self-pay | Admitting: Internal Medicine

## 2014-08-27 ENCOUNTER — Encounter: Payer: Self-pay | Admitting: *Deleted

## 2014-08-27 DIAGNOSIS — R7989 Other specified abnormal findings of blood chemistry: Secondary | ICD-10-CM

## 2014-08-27 DIAGNOSIS — R945 Abnormal results of liver function studies: Secondary | ICD-10-CM

## 2014-08-27 NOTE — Progress Notes (Signed)
Order placed for f/u liver panel.  

## 2014-08-30 ENCOUNTER — Encounter: Payer: Self-pay | Admitting: Internal Medicine

## 2014-08-30 NOTE — Progress Notes (Signed)
   Subjective:    Patient ID: Jose Perry, male    DOB: Oct 16, 1940, 73 y.o.   MRN: [id number]  Hypertension  73 year old male with past history of GERD, leukopenia, colon polyps, enlarged prostate and thyroid goiter.  He comes in today for a scheduled follow up.  Had a thyroid goiter.  Had 80% of thyroid removed - 30 years ago.  Was on thyroid replacement for 15 years and felt worse.  Labs checked and found to be not needed.  Blood pressure has been elevated recently.  Comes in today to f/u on his blood pressure.  Has noticed in the am - elevated more.  States >150/90.   Has a history of low white blood cell count.  Worked up by hematology.  Had extensive w/up including bone marrow.   Sees urology for his enlarged prostate.      Past Medical History  Diagnosis Date  . GERD (gastroesophageal reflux disease)   . Glaucoma   . Thyroid goiter   . Hx of colonic polyps   . Enlarged prostate   . Elevated blood pressure   . Leukopenia     has had an extensive w/up.      Outpatient Encounter Prescriptions as of 08/26/2014  Medication Sig  . bimatoprost (LUMIGAN) 0.03 % ophthalmic solution Place 1 drop into the right eye at bedtime.   Marland Kitchen omeprazole (PRILOSEC) 40 MG capsule Take 40 mg by mouth daily.  Marland Kitchen TAMSULOSIN HCL PO Take 0.4 mg by mouth daily.     Review of Systems Patient denies any headache, lightheadedness or dizziness.  No sinus or allergy issues.  No chest pain, tightness or palpitations.  No increased shortness of breath, cough or congestion.  No nausea or vomiting.  No acid reflux.  No abdominal pain or cramping.   No urine change.  Exercises regularly.  Blood pressures as outlined.           Objective:   Physical Exam  Filed Vitals:   08/26/14 1152  BP: 118/78  Pulse: 56  Temp: 97.6 F (36.4 C)   Blood pressure recheck:  130/72, 20/46  73 year old male in no acute distress.  HEENT:  Nares - clear.  Oropharynx - without lesions. NECK:  Supple.  Nontender.  No audible  carotid bruit.  HEART:  Appears to be regular.   LUNGS:  No crackles or wheezing audible.  Respirations even and unlabored.   RADIAL PULSE:  Equal bilaterally.  ABDOMEN:  Soft.  Nontender.  Bowel sounds present and normal.  No audible abdominal bruit.    EXTREMITIES:  No increased edema present.  DP pulses palpable and equal bilaterally.      Assessment & Plan:  1. Elevated blood pressure Blood pressure as outlined.  Doing better.  Follow.  He desires not to take medication.  Follow.    2. Leukopenia Has been worked up extensively.  See above.  Follow cbc.   3. Enlarged prostate Followed by urology.  Forward PSA.    HEALTH MAINTENANCE.  Scheduled him for a physical.  Has had colonoscopy.  Is followed by Dr Tiffany Kocher.  Colonoscopy 07/08/14 - two small polyps and internal hemorrhoids. Recommended f/u 06/2019.   Prostate and psa followed by urology.

## 2014-08-31 ENCOUNTER — Other Ambulatory Visit: Payer: Commercial Managed Care - PPO

## 2014-09-17 ENCOUNTER — Other Ambulatory Visit: Payer: Commercial Managed Care - PPO

## 2014-09-21 ENCOUNTER — Encounter: Payer: Self-pay | Admitting: Nurse Practitioner

## 2014-09-21 ENCOUNTER — Ambulatory Visit (INDEPENDENT_AMBULATORY_CARE_PROVIDER_SITE_OTHER): Payer: Medicare Other | Admitting: Nurse Practitioner

## 2014-09-21 VITALS — BP 161/93 | HR 58 | Temp 97.8°F | Resp 14 | Ht 70.0 in | Wt 165.0 lb

## 2014-09-21 DIAGNOSIS — R972 Elevated prostate specific antigen [PSA]: Secondary | ICD-10-CM | POA: Diagnosis not present

## 2014-09-21 DIAGNOSIS — H409 Unspecified glaucoma: Secondary | ICD-10-CM | POA: Diagnosis not present

## 2014-09-21 DIAGNOSIS — R030 Elevated blood-pressure reading, without diagnosis of hypertension: Secondary | ICD-10-CM | POA: Diagnosis not present

## 2014-09-21 DIAGNOSIS — IMO0001 Reserved for inherently not codable concepts without codable children: Secondary | ICD-10-CM

## 2014-09-21 NOTE — Progress Notes (Signed)
Subjective:    Patient ID: Jose Perry, male    DOB: 1941-04-27, 73 y.o.   MRN: [id number]  HPI Jose Perry is a 73 yo male here with a CC of elevated BP.   1) Elevated BP: 1-2 days of spiking in am and then goes down during daytime and is borderline to normal. Reading at home this am was 138/91. Pt states is was lower after working out 116/72.  3 days of consistent elevation was concerning to pt. Dr. Nicki Reaper and he had discussed watching and seeing any differences with diet and exercise.  Diet- low salt diet, no pork or beef, healthy Exercise- 7 days a week  Eye- Glaucoma hx  Pt would not like to try medication at this time. He is not opposed, but would like to see if this trend changes.   Review of Systems  Constitutional: Negative for fever, chills and diaphoresis.  Cardiovascular: Negative for chest pain, palpitations and leg swelling.  Gastrointestinal: Negative for nausea, vomiting and diarrhea.  Musculoskeletal: Negative for neck pain and neck stiffness.  Skin: Negative for rash.  Neurological: Negative for dizziness, syncope, weakness and headaches.   Past Medical History  Diagnosis Date  . GERD (gastroesophageal reflux disease)   . Glaucoma   . Thyroid goiter   . Hx of colonic polyps   . Enlarged prostate   . Elevated blood pressure   . Leukopenia     has had an extensive w/up.      History   Social History  . Marital Status: Married    Spouse Name: N/A    Number of Children: N/A  . Years of Education: N/A   Occupational History  . Not on file.   Social History Main Topics  . Smoking status: Never Smoker   . Smokeless tobacco: Never Used  . Alcohol Use: No  . Drug Use: No  . Sexual Activity: Not on file   Other Topics Concern  . Not on file   Social History Narrative    Past Surgical History  Procedure Laterality Date  . Eye surgery  2009    relieve pressure  . Back surgery  2002    ruptured disc  . Thyroid surgery  1968    goiter     Family History  Problem Relation Age of Onset  . Heart disease Mother   . Alcohol abuse Father   . Hyperlipidemia Father   . Stroke Father     No Known Allergies  Current Outpatient Prescriptions on File Prior to Visit  Medication Sig Dispense Refill  . bimatoprost (LUMIGAN) 0.03 % ophthalmic solution Place 1 drop into the right eye at bedtime.     Marland Kitchen omeprazole (PRILOSEC) 40 MG capsule Take 40 mg by mouth daily.    Marland Kitchen TAMSULOSIN HCL PO Take 0.4 mg by mouth daily.      No current facility-administered medications on file prior to visit.       Objective:   Physical Exam  Constitutional: He is oriented to person, place, and time. He appears well-developed and well-nourished. No distress.  HENT:  Head: Normocephalic and atraumatic.  Eyes: Conjunctivae are normal. Pupils are equal, round, and reactive to light. Right eye exhibits no discharge. Left eye exhibits no discharge. No scleral icterus.  Neck: Normal range of motion.  Cardiovascular: Normal rate and regular rhythm.   Pulmonary/Chest: Effort normal and breath sounds normal.  Musculoskeletal: He exhibits no edema or tenderness.  Neurological: He is alert and oriented  to person, place, and time.  Skin: Skin is warm and dry. He is not diaphoretic.  Psychiatric: He has a normal mood and affect. His behavior is normal. Judgment and thought content normal.    BP 161/93 mmHg  Pulse 58  Temp(Src) 97.8 F (36.6 C) (Oral)  Resp 14  Ht 5\' 10"  (1.778 m)  Wt 165 lb (74.844 kg)  BMI 23.68 kg/m2  SpO2 100%      Assessment & Plan:

## 2014-09-21 NOTE — Patient Instructions (Signed)
Please call [phone] and give me your before and after BP readings.   I can call you in a medication tomorrow if we need.   Follow up with Dr. Nicki Reaper in March.   Happy Holidays!

## 2014-09-21 NOTE — Assessment & Plan Note (Signed)
Reassessed BP today. Pt has elevated BP continuing and becoming more frequent. He is not thrilled at the possibility of starting a BP medication, but understands what continued HTN would do to his body. Discussed and pt plans to call tomorrow with a pre and post work out BP. We can start him on a med tomorrow if still high. He would like something that would keep him from going to the bathroom frequently due to existing prostate issues.

## 2014-09-22 ENCOUNTER — Telehealth: Payer: Self-pay

## 2014-09-22 ENCOUNTER — Other Ambulatory Visit (INDEPENDENT_AMBULATORY_CARE_PROVIDER_SITE_OTHER): Payer: Medicare Other

## 2014-09-22 DIAGNOSIS — R945 Abnormal results of liver function studies: Secondary | ICD-10-CM

## 2014-09-22 DIAGNOSIS — R7989 Other specified abnormal findings of blood chemistry: Secondary | ICD-10-CM

## 2014-09-22 LAB — HEPATIC FUNCTION PANEL
ALT: 19 U/L (ref 0–53)
AST: 39 U/L — ABNORMAL HIGH (ref 0–37)
Albumin: 4.2 g/dL (ref 3.5–5.2)
Alkaline Phosphatase: 58 U/L (ref 39–117)
BILIRUBIN TOTAL: 0.4 mg/dL (ref 0.2–1.2)
Bilirubin, Direct: 0 mg/dL (ref 0.0–0.3)
Total Protein: 6.9 g/dL (ref 6.0–8.3)

## 2014-09-22 NOTE — Telephone Encounter (Signed)
The patient called and is calling back. He stated he was instructed to call back and report his blood pressure.   Pt callback [phone]

## 2014-09-23 ENCOUNTER — Encounter: Payer: Self-pay | Admitting: *Deleted

## 2014-09-23 ENCOUNTER — Other Ambulatory Visit: Payer: Self-pay | Admitting: Internal Medicine

## 2014-09-23 DIAGNOSIS — R7989 Other specified abnormal findings of blood chemistry: Secondary | ICD-10-CM

## 2014-09-23 DIAGNOSIS — R945 Abnormal results of liver function studies: Secondary | ICD-10-CM

## 2014-09-23 NOTE — Progress Notes (Signed)
Order placed for f/u lab.   

## 2014-10-21 ENCOUNTER — Other Ambulatory Visit (INDEPENDENT_AMBULATORY_CARE_PROVIDER_SITE_OTHER): Payer: Medicare Other

## 2014-10-21 DIAGNOSIS — R945 Abnormal results of liver function studies: Secondary | ICD-10-CM

## 2014-10-21 DIAGNOSIS — R7989 Other specified abnormal findings of blood chemistry: Secondary | ICD-10-CM

## 2014-10-21 LAB — HEPATIC FUNCTION PANEL
ALBUMIN: 4.1 g/dL (ref 3.5–5.2)
ALK PHOS: 58 U/L (ref 39–117)
ALT: 20 U/L (ref 0–53)
AST: 36 U/L (ref 0–37)
Bilirubin, Direct: 0.1 mg/dL (ref 0.0–0.3)
TOTAL PROTEIN: 7 g/dL (ref 6.0–8.3)
Total Bilirubin: 0.6 mg/dL (ref 0.2–1.2)

## 2014-10-22 ENCOUNTER — Encounter: Payer: Self-pay | Admitting: *Deleted

## 2014-11-03 ENCOUNTER — Encounter: Payer: Self-pay | Admitting: Internal Medicine

## 2014-12-08 ENCOUNTER — Ambulatory Visit (INDEPENDENT_AMBULATORY_CARE_PROVIDER_SITE_OTHER): Payer: Medicare Other | Admitting: Internal Medicine

## 2014-12-08 ENCOUNTER — Encounter: Payer: Self-pay | Admitting: Internal Medicine

## 2014-12-08 VITALS — BP 110/80 | HR 63 | Temp 97.5°F | Ht 70.0 in | Wt 158.0 lb

## 2014-12-08 DIAGNOSIS — IMO0001 Reserved for inherently not codable concepts without codable children: Secondary | ICD-10-CM

## 2014-12-08 DIAGNOSIS — K219 Gastro-esophageal reflux disease without esophagitis: Secondary | ICD-10-CM | POA: Diagnosis not present

## 2014-12-08 DIAGNOSIS — Z86010 Personal history of colonic polyps: Secondary | ICD-10-CM | POA: Diagnosis not present

## 2014-12-08 DIAGNOSIS — Z0000 Encounter for general adult medical examination without abnormal findings: Secondary | ICD-10-CM

## 2014-12-08 DIAGNOSIS — N400 Enlarged prostate without lower urinary tract symptoms: Secondary | ICD-10-CM

## 2014-12-08 DIAGNOSIS — D72819 Decreased white blood cell count, unspecified: Secondary | ICD-10-CM | POA: Diagnosis not present

## 2014-12-08 DIAGNOSIS — E049 Nontoxic goiter, unspecified: Secondary | ICD-10-CM

## 2014-12-08 DIAGNOSIS — R030 Elevated blood-pressure reading, without diagnosis of hypertension: Secondary | ICD-10-CM | POA: Diagnosis not present

## 2014-12-08 DIAGNOSIS — K5900 Constipation, unspecified: Secondary | ICD-10-CM | POA: Diagnosis not present

## 2014-12-08 NOTE — Progress Notes (Signed)
Pre visit review using our clinic review tool, if applicable. No additional management support is needed unless otherwise documented below in the visit note. 

## 2014-12-08 NOTE — Progress Notes (Signed)
Patient ID: Jose Perry, male   DOB: 1941/08/29, 74 y.o.   MRN: [id number]   Subjective:    Patient ID: Jose Perry, male    DOB: [birth year], 74 y.o.   MRN: [id number]  HPI  Patient here for a scheduled physical.  States he gets his prostate checks and psa's through urology.  States he is doing well.  Staying active.  No cardiac symptoms with increased activity or exertion.  State his blood pressure is averaging 130/80.  Up to date with prostate checks.  States since his colonoscopy in 06/2014, his bowels have been doing well.  Has been taking fiber and eating a specific yogurt.  States this past week, bowel movements weren't as regular.  Actually he states, he has had a bowel movement daily, just the amount is less.  Took a laxative yesterday.  Had a good bowel movement after this.  No abdominal pain or cramping.     Past Medical History  Diagnosis Date  . GERD (gastroesophageal reflux disease)   . Glaucoma   . Thyroid goiter   . Hx of colonic polyps   . Enlarged prostate   . Elevated blood pressure   . Leukopenia     has had an extensive w/up.      Current Outpatient Prescriptions on File Prior to Visit  Medication Sig Dispense Refill  . bimatoprost (LUMIGAN) 0.03 % ophthalmic solution Place 1 drop into the right eye at bedtime.     . TAMSULOSIN HCL PO Take 0.4 mg by mouth daily.     Marland Kitchen omeprazole (PRILOSEC) 40 MG capsule Take 40 mg by mouth daily.     No current facility-administered medications on file prior to visit.    Review of Systems  Constitutional: Negative for appetite change and unexpected weight change.  HENT: Negative for congestion and sinus pressure.   Respiratory: Negative for cough, chest tightness and shortness of breath.   Cardiovascular: Negative for chest pain, palpitations and leg swelling.  Gastrointestinal: Negative for nausea, vomiting, abdominal pain, diarrhea and constipation.       See above for bowel change.   Genitourinary: Negative for dysuria  and difficulty urinating.  Musculoskeletal: Negative for back pain and joint swelling.  Neurological: Negative for dizziness, light-headedness and headaches.  Psychiatric/Behavioral: Negative for behavioral problems and dysphoric mood.       Objective:     Blood pressure recheck:  124/72, pulse 56  Physical Exam  Constitutional: He is oriented to person, place, and time. He appears well-developed and well-nourished. No distress.  HENT:  Nose: Nose normal.  Mouth/Throat: Oropharynx is clear and moist.  Neck: Neck supple. No thyromegaly present.  Cardiovascular: Normal rate and regular rhythm.   Pulmonary/Chest: Effort normal and breath sounds normal. No respiratory distress.  Abdominal: Soft. Bowel sounds are normal. There is no tenderness.  Genitourinary:  Performed by Dr Jacqlyn Larsen.   Musculoskeletal: He exhibits no edema.  Lymphadenopathy:    He has no cervical adenopathy.  Neurological: He is alert and oriented to person, place, and time.  Skin: No rash noted. No erythema.  Psychiatric: He has a normal mood and affect. His behavior is normal.    BP 110/80 mmHg  Pulse 63  Temp(Src) 97.5 F (36.4 C) (Oral)  Ht 5\' 10"  (1.778 m)  Wt 158 lb (71.668 kg)  BMI 22.67 kg/m2  SpO2 99% Wt Readings from Last 3 Encounters:  12/08/14 158 lb (71.668 kg)  09/21/14 165 lb (74.844 kg)  08/26/14 163 lb  4 oz (74.05 kg)     Lab Results  Component Value Date   WBC 3.2* 08/26/2014   HGB 13.9 08/26/2014   HCT 43.4 08/26/2014   PLT 139.0* 08/26/2014   GLUCOSE 82 08/11/2014   CHOL 196 08/11/2014   TRIG 22.0 08/11/2014   HDL 84.20 08/11/2014   LDLCALC 107* 08/11/2014   ALT 20 10/21/2014   AST 36 10/21/2014   NA 142 08/11/2014   K 4.3 08/11/2014   CL 109 08/11/2014   CREATININE 1.3 08/26/2014   BUN 18 08/11/2014   CO2 24 08/11/2014   TSH 1.95 08/11/2014   PSA 2.75 08/11/2014       Assessment & Plan:   Problem List Items Addressed This Visit    Constipation - Primary    Bowels  as outlined.  Continue to monitor.  Just had colonoscopy 06/11/14.  Recommended f/u colonoscopy in 06/2019.        Elevated blood pressure    Blood pressure appears to be doing better.  Follow pressures.        Enlarged prostate    Followed by urology.  Sees Dr Jacqlyn Larsen.        GERD (gastroesophageal reflux disease)    Reflux controlled on no medication.  Follow.       Goiter    Had 80% of thyroid removed.  Follow tsh.  On no medication.        Health care maintenance    Physical today.  PSA and prostate checks followed by Dr Jacqlyn Larsen.  Colonoscopy 07/08/14 as outlined.        History of colonic polyps    Colonoscopy 07/08/14 as outlined in overview.  Recommended f/u colonoscopy in 06/2019.        Leukopenia    Has been worked up by hematology.  Had bone marrow, etc.  W/up unrevealing.  White count has been stable.  Follow.          I spent 25 minutes with the patient and more than 50% of the time was spent in consultation regarding the above.     Einar Pheasant, MD

## 2014-12-13 ENCOUNTER — Encounter: Payer: Self-pay | Admitting: Internal Medicine

## 2014-12-13 DIAGNOSIS — Z0000 Encounter for general adult medical examination without abnormal findings: Secondary | ICD-10-CM | POA: Insufficient documentation

## 2014-12-13 NOTE — Assessment & Plan Note (Signed)
Has been worked up by hematology.  Had bone marrow, etc.  W/up unrevealing.  White count has been stable.  Follow.

## 2014-12-13 NOTE — Assessment & Plan Note (Signed)
Bowels as outlined.  Continue to monitor.  Just had colonoscopy 06/11/14.  Recommended f/u colonoscopy in 06/2019.

## 2014-12-13 NOTE — Assessment & Plan Note (Signed)
Colonoscopy 07/08/14 as outlined in overview.  Recommended f/u colonoscopy in 06/2019.

## 2014-12-13 NOTE — Assessment & Plan Note (Signed)
Had 80% of thyroid removed.  Follow tsh.  On no medication.

## 2014-12-13 NOTE — Assessment & Plan Note (Signed)
Blood pressure appears to be doing better.  Follow pressures.

## 2014-12-13 NOTE — Assessment & Plan Note (Signed)
Followed by urology.  Sees Dr Jacqlyn Larsen.

## 2014-12-13 NOTE — Assessment & Plan Note (Signed)
Reflux controlled on no medication.  Follow.

## 2014-12-13 NOTE — Assessment & Plan Note (Signed)
Physical today.  PSA and prostate checks followed by Dr Jacqlyn Larsen.  Colonoscopy 07/08/14 as outlined.

## 2015-01-20 DIAGNOSIS — H50112 Monocular exotropia, left eye: Secondary | ICD-10-CM | POA: Diagnosis not present

## 2015-01-20 DIAGNOSIS — H4011X3 Primary open-angle glaucoma, severe stage: Secondary | ICD-10-CM | POA: Diagnosis not present

## 2015-01-20 DIAGNOSIS — H43393 Other vitreous opacities, bilateral: Secondary | ICD-10-CM | POA: Diagnosis not present

## 2015-01-20 DIAGNOSIS — H2513 Age-related nuclear cataract, bilateral: Secondary | ICD-10-CM | POA: Diagnosis not present

## 2015-01-30 NOTE — H&P (Signed)
PATIENT NAME:  Jose Perry, Jose Perry MR#:  [id number] DATE OF BIRTH:  27-Apr-1941  DATE OF ADMISSION:  03/23/2014  REFERRING PHYSICIAN:  Dr. Lavonia Drafts   PRIMARY CARE PHYSICIAN: Currently, patient is transitioning to [facility] internal medicine.   CHIEF COMPLAINT: Weakness, fever, chills.   HISTORY OF PRESENT ILLNESS: This is a 74 year old male with known history of gastroesophageal reflux disease, BPH, and glaucoma who presents with complaints of generalized weakness, fever, and chills for the last 24 hours. He denies any cough, any productive sputum , any rash, but reports mild polyuria. Upon presentation, the patient's maximum temperature was at 100.9.  He was tachycardic at 104. The patient had basic workup done in ED which did show evidence of UTI with no leukocytosis. He had +1 leukocyte esterase with 30 white blood cells as well as creatinine was at 1.56.  The patient denies any focal deficits, any slurred speech, just reports generalized weakness. Hospitalist requested to admit the patient to treat his UTI.   PAST MEDICAL HISTORY:  1. Benign prostatic hypertrophy.  2. GERD.  3. Glaucoma.   SURGICAL HISTORY:  1. Partial thyroidectomy 40 years ago.  2. Back surgery.  3. Glaucoma surgery 10 years ago on the left eye.   SOCIAL HISTORY: The patient denies any smoking, any alcohol, any illicit drug use.   FAMILY HISTORY: Denies any history of coronary artery disease at young age.   ALLERGIES: No known drug allergies.   HOME MEDICATIONS:  1. Tamsulosin 0.4 mg oral daily.  2. Lumigan to the right eye 0.01% one drop. 3. Omeprazole 40 mg oral daily.   REVIEW OF SYSTEMS:  CONSTITUTIONAL:  The patient reports fever, chills, fatigue, weakness.  EYES: Denies blurred vision, double vision, inflammation.  Reports history of glaucoma.  EARS, NOSE, AND THROAT:  Denies tinnitus, ear pain, hearing loss, epistaxis.  RESPIRATORY: Denies cough, wheezing, hemoptysis, dyspnea.  CARDIOVASCULAR: Denies  chest pain, edema, or history of palpitations.  GASTROINTESTINAL: Denies nausea, vomiting, diarrhea, abdominal pain.  GENITOURINARY: Denies dysuria, hematuria, renal colic.  ENDOCRINE: Denies polyuria, polydipsia, heat or cold intolerance.  HEMATOLOGY AND LYMPHATIC: Denies anemia, easy bruising, bleeding diathesis.  INTEGUMENT: Denies any acne, rash or skin lesion.  MUSCULOSKELETAL: Denies any swelling, gout, cramps or arthritis.  NEUROLOGIC: Denies CVA, TIA, tremors, vertigo, focal deficits. Reports generalized weakness.  PSYCHIATRIC: Denies anxiety, insomnia, or depression.   PHYSICAL EXAMINATION:  VITAL SIGNS: Temperature, T max 100.9. Pulse 9, highest pulse was 104. Respiratory rate 20, blood pressure 141/72, saturating 98% percent on room air.  GENERAL: Elderly male sitting on bed, in no apparent distress.  HEENT: Head atraumatic, normocephalic. Pupils equal, reactive to light. Pink conjunctivae. . Moist oral mucosa.  NECK: Supple. No thyromegaly. No JVD.  CHEST: Good air entry bilaterally. No wheezing, rales, rhonchi.  CARDIOVASCULAR: S1, S2 heard. No rubs, murmur or gallops.  ABDOMEN: Soft, nontender, nondistended. Bowel sounds present. No CVA tenderness.  EXTREMITIES: No edema. No clubbing. No cyanosis. Pedal pulses felt bilaterally.  PSYCHIATRIC: Appropriate affect. Awake, alert x 3. Intact judgment and insight.  NEUROLOGIC: Cranial nerves grossly intact. Motor 5/5. No focal deficits.  MUSCULOSKELETAL: No joint effusion or erythema.  LYMPHATIC: No cervical lymphadenopathy could be appreciated.   PERTINENT LABORATORY DATA: Glucose 103, BUN 17, creatinine 1.56, sodium 135, potassium 3.8, chloride 103, CO2 of 27, ALT 30, AST 43, alkaline phosphatase 57. Troponin less than 0.02. White blood cells 7, hemoglobin 13.1, hematocrit 40.3, platelets 130,000. Urinalysis: 30 white blood cells and +1 leukocyte esterase.  ASSESSMENT AND PLAN:  1. Sepsis due to urinary tract infection. The  patient may meet sepsis criteria as his temperature is 100.9 and heart rate is 104. This is most likely related to his urinary tract infection. He will be started on Rocephin for that.  2. Benign prostatic hypertrophy. Continue with Flomax.  3. Glaucoma. Continue with Lumigan eyedrops.  4. Gastroesophageal reflux disease. Continue with PPI.  5. Deep vein thrombosis prophylaxis. Subcutaneous heparin.   CODE STATUS: Full code.   TOTAL TIME SPENT ON ADMISSION AND PATIENT CARE: 45 minutes.     ____________________________ Albertine Patricia, MD dse:dd D: 03/23/2014 03:40:16 ET T: 03/23/2014 04:24:41 ET JOB#: [id number]  cc: Albertine Patricia, MD, <Dictator> Jeter Tomey Graciela Husbands MD ELECTRONICALLY SIGNED 03/24/2014 2:02

## 2015-01-30 NOTE — Discharge Summary (Signed)
PATIENT NAME:  Jose Perry, Jose Perry MR#:  [id number] DATE OF BIRTH:  05-13-1941  DATE OF ADMISSION:  03/23/2014 DATE OF DISCHARGE:  03/23/2014  ADMISSION DIAGNOSES: 1.  Sepsis. 2.  Urinary tract infection.   DISCHARGE DIAGNOSES: 1.  Sepsis secondary to urinary tract infection.  2.  Benign prostatic hyperplasia.   CONSULTATIONS:  None.   Blood cultures negative to date.   Urine culture pending.   HOSPITAL COURSE:  A 74 year old male presented with a fever and chills, was found to have urinary tract infection. For further details, please refer to H and P. 1.  Sepsis.  The patient was admitted for fever, as well as chills and tachycardia. He was afebrile during the hospitalization. His tachycardia resolved after fluids and antibiotics. He feels much better. He is not complaining of any dysuria.  He was started on Rocephin and will be discharged with Keflex.  2.  Urinary tract infection. Plan as outlined below. The patient will need 14 days total of antibiotics due to his history of BPH and UTI in a male. He will follow up with his urologist in 2 weeks.  3.  Benign prostatic hyperplasia.  The patient was continued on tamsulosin and will follow up with Dr. Jacqlyn Larsen as an outpatient.   DISCHARGE MEDICATIONS: 1.  Keflex 500 mg p.o. t.i.d. x 13 days.  2.  Tamsulosin 0.4 mg daily.  3.  Omeprazole 40 mg daily as needed.  4.  Lumigan eye drops 1 drop in affected eye daily, to right eye.  DISCHARGE DIET: Regular diet.   DISCHARGE ACTIVITY:  As tolerated.  DISCHARGE FOLLOWUP:  In 2 weeks with Dr. Jacqlyn Larsen.   TIME SPENT:  35 minutes.  The patient is medically stable for discharge.     ____________________________ Viral Schramm P. Benjie Karvonen, MD spm:dmm D: 03/23/2014 12:16:47 ET T: 03/23/2014 12:28:07 ET JOB#: [id number]  cc: Roshard Rezabek P. Benjie Karvonen, MD, <Dictator> Denice Bors. Jacqlyn Larsen, MD Donell Beers Francina Beery MD ELECTRONICALLY SIGNED 03/24/2014 11:17

## 2015-03-02 DIAGNOSIS — H4011X3 Primary open-angle glaucoma, severe stage: Secondary | ICD-10-CM | POA: Diagnosis not present

## 2015-06-10 ENCOUNTER — Ambulatory Visit (INDEPENDENT_AMBULATORY_CARE_PROVIDER_SITE_OTHER): Payer: Medicare Other | Admitting: Internal Medicine

## 2015-06-10 ENCOUNTER — Encounter (INDEPENDENT_AMBULATORY_CARE_PROVIDER_SITE_OTHER): Payer: Self-pay

## 2015-06-10 ENCOUNTER — Encounter: Payer: Self-pay | Admitting: Internal Medicine

## 2015-06-10 VITALS — BP 110/80 | HR 72 | Temp 97.9°F | Ht 70.0 in | Wt 158.1 lb

## 2015-06-10 DIAGNOSIS — E049 Nontoxic goiter, unspecified: Secondary | ICD-10-CM

## 2015-06-10 DIAGNOSIS — N400 Enlarged prostate without lower urinary tract symptoms: Secondary | ICD-10-CM | POA: Diagnosis not present

## 2015-06-10 DIAGNOSIS — D72819 Decreased white blood cell count, unspecified: Secondary | ICD-10-CM

## 2015-06-10 DIAGNOSIS — Z86010 Personal history of colonic polyps: Secondary | ICD-10-CM

## 2015-06-10 DIAGNOSIS — K219 Gastro-esophageal reflux disease without esophagitis: Secondary | ICD-10-CM

## 2015-06-10 DIAGNOSIS — Z23 Encounter for immunization: Secondary | ICD-10-CM

## 2015-06-10 DIAGNOSIS — R0989 Other specified symptoms and signs involving the circulatory and respiratory systems: Secondary | ICD-10-CM

## 2015-06-10 NOTE — Progress Notes (Signed)
Pre-visit discussion using our clinic review tool. No additional management support is needed unless otherwise documented below in the visit note.  

## 2015-06-10 NOTE — Progress Notes (Signed)
Patient ID: Jose Perry, male   DOB: July 14, 1941, 74 y.o.   MRN: [id number]   Subjective:    Patient ID: Jose Perry, male    DOB: [birth year], 74 y.o.   MRN: [id number]  HPI  Patient here for a scheduled follow up.  States he has been doing well.  Stays active.  No cardiac symptoms with increased activity or exertion.  No sob.  States blood pressure has been doing well.  Highest blood pressure 137/79.  Has adjusted his diet.  Bowels stable.  Has f/u with Dr Jacqlyn Larsen 06/26/15.     Past Medical History  Diagnosis Date  . GERD (gastroesophageal reflux disease)   . Glaucoma   . Thyroid goiter   . Hx of colonic polyps   . Enlarged prostate   . Elevated blood pressure   . Leukopenia     has had an extensive w/up.     Past Surgical History  Procedure Laterality Date  . Eye surgery  2009    relieve pressure  . Back surgery  2002    ruptured disc  . Thyroid surgery  1968    goiter   Family History  Problem Relation Age of Onset  . Heart disease Mother   . Alcohol abuse Father   . Hyperlipidemia Father   . Stroke Father    Social History   Social History  . Marital Status: Married    Spouse Name: N/A  . Number of Children: N/A  . Years of Education: N/A   Social History Main Topics  . Smoking status: Never Smoker   . Smokeless tobacco: Never Used  . Alcohol Use: No  . Drug Use: No  . Sexual Activity: Not Asked   Other Topics Concern  . None   Social History Narrative    Outpatient Encounter Prescriptions as of 06/10/2015  Medication Sig  . bimatoprost (LUMIGAN) 0.03 % ophthalmic solution Place 1 drop into the right eye at bedtime.   Marland Kitchen omeprazole (PRILOSEC) 40 MG capsule Take 40 mg by mouth daily.  Marland Kitchen TAMSULOSIN HCL PO Take 0.4 mg by mouth daily.    No facility-administered encounter medications on file as of 06/10/2015.    Review of Systems  Constitutional: Negative for appetite change and unexpected weight change.  HENT: Negative for congestion and sinus pressure.    Respiratory: Negative for cough, chest tightness and shortness of breath.   Cardiovascular: Negative for chest pain, palpitations and leg swelling.  Gastrointestinal: Negative for nausea, vomiting, abdominal pain and diarrhea.  Neurological: Negative for dizziness, light-headedness and headaches.  Psychiatric/Behavioral: Negative for confusion, decreased concentration and agitation.       Objective:     Blood pressure rechecked by me:  122/76  Physical Exam  Constitutional: He appears well-developed and well-nourished. No distress.  HENT:  Nose: Nose normal.  Mouth/Throat: Oropharynx is clear and moist.  Eyes: Conjunctivae are normal. Right eye exhibits no discharge. Left eye exhibits no discharge.  Neck: Neck supple. No thyromegaly present.  Cardiovascular: Normal rate and regular rhythm.   Aortic bruit audible.   Pulmonary/Chest: Effort normal and breath sounds normal. No respiratory distress.  Abdominal: Soft. Bowel sounds are normal. There is no tenderness.  Musculoskeletal: He exhibits no edema or tenderness.  Lymphadenopathy:    He has no cervical adenopathy.  Skin: No rash noted. No erythema.  Psychiatric: He has a normal mood and affect. His behavior is normal.    BP 110/80 mmHg  Pulse 72  Temp(Src) 97.9  F (36.6 C) (Oral)  Ht 5\' 10"  (1.778 m)  Wt 158 lb 2 oz (71.725 kg)  BMI 22.69 kg/m2  SpO2 98% Wt Readings from Last 3 Encounters:  06/10/15 158 lb 2 oz (71.725 kg)  12/08/14 158 lb (71.668 kg)  09/21/14 165 lb (74.844 kg)     Lab Results  Component Value Date   WBC 3.2* 08/26/2014   HGB 13.9 08/26/2014   HCT 43.4 08/26/2014   PLT 139.0* 08/26/2014   GLUCOSE 82 08/11/2014   CHOL 196 08/11/2014   TRIG 22.0 08/11/2014   HDL 84.20 08/11/2014   LDLCALC 107* 08/11/2014   ALT 20 10/21/2014   AST 36 10/21/2014   NA 142 08/11/2014   K 4.3 08/11/2014   CL 109 08/11/2014   CREATININE 1.3 08/26/2014   BUN 18 08/11/2014   CO2 24 08/11/2014   TSH 1.95  08/11/2014   PSA 2.75 08/11/2014       Assessment & Plan:   Problem List Items Addressed This Visit    Bruit    Audible aortic bruit.  Check aortic ultrasound.        Enlarged prostate    Followed by urology - Dr Jacqlyn Larsen.  Has appt 06/26/15.        GERD (gastroesophageal reflux disease)    No upper symptoms reported.        Goiter    S/p 80% thyroid removed.  Follow tsh.  On no medication.       History of colonic polyps    Colonoscopy 07/08/14 - tubular adenoma x 2.  Recommended f/u colonoscopy in 06/2019.        Leukopenia    Has been worked up by hematology.  Had bone marrow, etc.  W/up unrevealing.  White count has been stable.  Follow.         Other Visit Diagnoses    Abdominal bruit    -  Primary    Relevant Orders    Ambulatory referral to Vascular Surgery    Need for prophylactic vaccination against Streptococcus pneumoniae (pneumococcus)        Relevant Orders    Pneumococcal conjugate vaccine 13-valent (Completed)        Einar Pheasant, MD

## 2015-06-14 ENCOUNTER — Encounter: Payer: Self-pay | Admitting: Internal Medicine

## 2015-06-14 DIAGNOSIS — R0989 Other specified symptoms and signs involving the circulatory and respiratory systems: Secondary | ICD-10-CM | POA: Insufficient documentation

## 2015-06-14 NOTE — Assessment & Plan Note (Signed)
Has been worked up by hematology.  Had bone marrow, etc.  W/up unrevealing.  White count has been stable.  Follow.   

## 2015-06-14 NOTE — Assessment & Plan Note (Signed)
Audible aortic bruit.  Check aortic ultrasound.

## 2015-06-14 NOTE — Assessment & Plan Note (Signed)
Followed by urology - Dr Jacqlyn Larsen.  Has appt 06/26/15.

## 2015-06-14 NOTE — Assessment & Plan Note (Signed)
S/p 80% thyroid removed.  Follow tsh.  On no medication.

## 2015-06-14 NOTE — Assessment & Plan Note (Signed)
No upper symptoms reported.   

## 2015-06-14 NOTE — Assessment & Plan Note (Signed)
Colonoscopy 07/08/14 - tubular adenoma x 2.  Recommended f/u colonoscopy in 06/2019.

## 2015-06-18 DIAGNOSIS — I714 Abdominal aortic aneurysm, without rupture: Secondary | ICD-10-CM | POA: Diagnosis not present

## 2015-07-20 ENCOUNTER — Ambulatory Visit (INDEPENDENT_AMBULATORY_CARE_PROVIDER_SITE_OTHER): Payer: Medicare Other

## 2015-07-20 DIAGNOSIS — Z23 Encounter for immunization: Secondary | ICD-10-CM

## 2015-07-28 DIAGNOSIS — N529 Male erectile dysfunction, unspecified: Secondary | ICD-10-CM | POA: Diagnosis not present

## 2015-07-28 DIAGNOSIS — R3914 Feeling of incomplete bladder emptying: Secondary | ICD-10-CM | POA: Diagnosis not present

## 2015-07-28 DIAGNOSIS — Z79899 Other long term (current) drug therapy: Secondary | ICD-10-CM | POA: Diagnosis not present

## 2015-07-28 DIAGNOSIS — R339 Retention of urine, unspecified: Secondary | ICD-10-CM | POA: Diagnosis not present

## 2015-07-28 DIAGNOSIS — N401 Enlarged prostate with lower urinary tract symptoms: Secondary | ICD-10-CM | POA: Diagnosis not present

## 2015-07-28 DIAGNOSIS — R3915 Urgency of urination: Secondary | ICD-10-CM | POA: Diagnosis not present

## 2015-07-28 DIAGNOSIS — R972 Elevated prostate specific antigen [PSA]: Secondary | ICD-10-CM | POA: Diagnosis not present

## 2015-08-26 DIAGNOSIS — H401123 Primary open-angle glaucoma, left eye, severe stage: Secondary | ICD-10-CM | POA: Diagnosis not present

## 2015-08-30 ENCOUNTER — Emergency Department
Admission: EM | Admit: 2015-08-30 | Discharge: 2015-08-30 | Disposition: A | Discharge status: Home / Self Care | Payer: Medicare Other | Attending: Emergency Medicine | Admitting: Emergency Medicine

## 2015-08-30 ENCOUNTER — Encounter: Payer: Self-pay | Admitting: Medical Oncology

## 2015-08-30 ENCOUNTER — Emergency Department: Payer: Medicare Other

## 2015-08-30 ENCOUNTER — Encounter: Payer: Self-pay | Admitting: Family Medicine

## 2015-08-30 ENCOUNTER — Ambulatory Visit (INDEPENDENT_AMBULATORY_CARE_PROVIDER_SITE_OTHER): Payer: Medicare Other | Admitting: Family Medicine

## 2015-08-30 VITALS — BP 119/80 | HR 54 | Temp 98.8°F | Resp 16 | Wt 158.8 lb

## 2015-08-30 DIAGNOSIS — R079 Chest pain, unspecified: Secondary | ICD-10-CM | POA: Diagnosis not present

## 2015-08-30 DIAGNOSIS — R0602 Shortness of breath: Secondary | ICD-10-CM

## 2015-08-30 DIAGNOSIS — R0600 Dyspnea, unspecified: Secondary | ICD-10-CM | POA: Diagnosis not present

## 2015-08-30 LAB — COMPREHENSIVE METABOLIC PANEL
ALT: 25 U/L (ref 17–63)
AST: 44 U/L — AB (ref 15–41)
Albumin: 4.3 g/dL (ref 3.5–5.0)
Alkaline Phosphatase: 58 U/L (ref 38–126)
Anion gap: 7 (ref 5–15)
BUN: 15 mg/dL (ref 6–20)
CHLORIDE: 104 mmol/L (ref 101–111)
CO2: 28 mmol/L (ref 22–32)
CREATININE: 1.34 mg/dL — AB (ref 0.61–1.24)
Calcium: 10.9 mg/dL — ABNORMAL HIGH (ref 8.9–10.3)
GFR calc non Af Amer: 51 mL/min — ABNORMAL LOW (ref >60)
GFR, EST AFRICAN AMERICAN: 59 mL/min — AB (ref >60)
Glucose, Bld: 74 mg/dL (ref 65–99)
POTASSIUM: 3.6 mmol/L (ref 3.5–5.1)
SODIUM: 139 mmol/L (ref 135–145)
Total Bilirubin: 0.7 mg/dL (ref 0.3–1.2)
Total Protein: 7.3 g/dL (ref 6.5–8.1)

## 2015-08-30 LAB — CBC WITH DIFFERENTIAL/PLATELET
BASOS ABS: 0 10*3/uL (ref 0–0.1)
BASOS PCT: 1 %
Eosinophils Absolute: 0.2 10*3/uL (ref 0–0.7)
Eosinophils Relative: 6 %
HEMATOCRIT: 46.1 % (ref 40.0–52.0)
HEMOGLOBIN: 14.9 g/dL (ref 13.0–18.0)
Lymphocytes Relative: 40 %
Lymphs Abs: 1.4 10*3/uL (ref 1.0–3.6)
MCH: 27.3 pg (ref 26.0–34.0)
MCHC: 32.3 g/dL (ref 32.0–36.0)
MCV: 84.5 fL (ref 80.0–100.0)
Monocytes Absolute: 0.3 10*3/uL (ref 0.2–1.0)
Monocytes Relative: 10 %
NEUTROS ABS: 1.5 10*3/uL (ref 1.4–6.5)
NEUTROS PCT: 43 %
Platelets: 111 10*3/uL — ABNORMAL LOW (ref 150–440)
RBC: 5.46 MIL/uL (ref 4.40–5.90)
RDW: 14.7 % — AB (ref 11.5–14.5)
WBC: 3.4 10*3/uL — ABNORMAL LOW (ref 3.8–10.6)

## 2015-08-30 LAB — BRAIN NATRIURETIC PEPTIDE: B Natriuretic Peptide: 29 pg/mL (ref 0.0–100.0)

## 2015-08-30 LAB — T4, FREE: Free T4: 0.71 ng/dL (ref 0.61–1.12)

## 2015-08-30 LAB — TROPONIN I

## 2015-08-30 MED ORDER — IOHEXOL 350 MG/ML SOLN
100.0000 mL | Freq: Once | INTRAVENOUS | Status: AC | PRN
  Administered 2015-08-30: 100 mL via INTRAVENOUS

## 2015-08-30 NOTE — Progress Notes (Signed)
Pre visit review using our clinic review tool, if applicable. No additional management support is needed unless otherwise documented below in the visit note. 

## 2015-08-30 NOTE — ED Provider Notes (Signed)
[facility] Emergency Department Provider Note     Time seen: ----------------------------------------- 11:09 AM on 08/30/2015 -----------------------------------------    I have reviewed the triage vital signs and the nursing notes.   HISTORY  Chief Complaint No chief complaint on file.    HPI Jose Perry is a 74 y.o. male who presents ER with shortness of breath on exertion. Patient is not had any fevers, chills, chest pain, nausea vomiting or diarrhea. Reportedly he was recently seen by his doctor and had EKG changes as well as the shortness of breath. Was sent to the ER for further evaluation. Patient states shortness of breath is worse when he lies back but is also present on exertion.   Past Medical History  Diagnosis Date  . GERD (gastroesophageal reflux disease)   . Glaucoma   . Thyroid goiter   . Hx of colonic polyps   . Enlarged prostate   . Elevated blood pressure   . Leukopenia     has had an extensive w/up.      Patient Active Problem List   Diagnosis Date Noted  . Shortness of breath 08/30/2015  . Bruit 06/14/2015  . Health care maintenance 12/13/2014  . Glaucoma 09/21/2014  . Goiter 06/28/2014  . Elevated blood pressure 06/28/2014  . Leukopenia 06/28/2014  . Constipation 06/28/2014  . History of colonic polyps 06/28/2014  . Enlarged prostate 06/28/2014  . GERD (gastroesophageal reflux disease) 06/28/2014    Past Surgical History  Procedure Laterality Date  . Eye surgery  2009    relieve pressure  . Back surgery  2002    ruptured disc  . Thyroid surgery  1968    goiter    Allergies Review of patient's allergies indicates no known allergies.  Social History Social History  Substance Use Topics  . Smoking status: Never Smoker   . Smokeless tobacco: Never Used  . Alcohol Use: No    Review of Systems Constitutional: Negative for fever. Eyes: Negative for visual changes. ENT: Negative for sore  throat. Cardiovascular: Negative for chest pain. Respiratory: Positive shortness of breath Gastrointestinal: Negative for abdominal pain, vomiting and diarrhea. Genitourinary: Negative for dysuria. Musculoskeletal: Negative for back pain. Skin: Negative for rash. Neurological: Negative for headaches, focal weakness or numbness.  10-point ROS otherwise negative.  ____________________________________________   PHYSICAL EXAM:  VITAL SIGNS: ED Triage Vitals  Enc Vitals Group     BP --      Pulse --      Resp --      Temp --      Temp src --      SpO2 --      Weight --      Height --      Head Cir --      Peak Flow --      Pain Score --      Pain Loc --      Pain Edu? --      Excl. in [place]? --     Constitutional: Alert and oriented. Well appearing and in no distress. Eyes: Conjunctivae are normal. PERRL. Normal extraocular movements. ENT   Head: Normocephalic and atraumatic.   Nose: No congestion/rhinnorhea.   Mouth/Throat: Mucous membranes are moist.   Neck: No stridor. Cardiovascular: Normal rate, regular rhythm. Normal and symmetric distal pulses are present in all extremities. No murmurs, rubs, or gallops. Respiratory: Normal respiratory effort without tachypnea nor retractions. Breath sounds are clear and equal bilaterally. No wheezes/rales/rhonchi. Gastrointestinal: Soft  and nontender. No distention. No abdominal bruits.  Musculoskeletal: Nontender with normal range of motion in all extremities. No joint effusions.  No lower extremity tenderness nor edema. Neurologic:  Normal speech and language. No gross focal neurologic deficits are appreciated. Speech is normal. No gait instability. Skin:  Skin is warm, dry and intact. No rash noted. Psychiatric: Mood and affect are normal. Speech and behavior are normal. Patient exhibits appropriate insight and judgment. ____________________________________________  EKG: Interpreted by me. Sinus bradycardia with rate  of 49 bpm, mild ST and T-wave changes in the inferior leads, normal PR interval, with, normal QT interval. Normal axis.  ____________________________________________  ED COURSE:  Pertinent labs & imaging results that were available during my care of the patient were reviewed by me and considered in my medical decision making (see chart for details). Patient is in no acute distress, will check basic labs and reevaluate. ____________________________________________    LABS (pertinent positives/negatives)  Labs Reviewed  CBC WITH DIFFERENTIAL/PLATELET - Abnormal; Notable for the following:    WBC 3.4 (*)    RDW 14.7 (*)    Platelets 111 (*)    All other components within normal limits  COMPREHENSIVE METABOLIC PANEL - Abnormal; Notable for the following:    Creatinine, Ser 1.34 (*)    Calcium 10.9 (*)    AST 44 (*)    GFR calc non Af Amer 51 (*)    GFR calc Af Amer 59 (*)    All other components within normal limits  BRAIN NATRIURETIC PEPTIDE  TROPONIN I  T4, FREE    RADIOLOGY Images were viewed by me  Chest x-ray/CT angiogram IMPRESSION: No evidence of pulmonary emboli.  Enlarged left thyroid gland with apparent dominant nodule as described. Nonemergent ultrasound is recommended for further evaluation as clinically indicated.  Large hypervascular area within the left lobe of the liver. 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 ____________________________________________  FINAL ASSESSMENT AND PLAN  Dyspnea  Plan: Patient with labs and imaging as dictated above. No clear etiology for patient's dyspnea. Repeat EKG does not reveal any acute changes. CT angiogram as dictated above. He'll be discharge with close follow-up with cardiology for reevaluation. At this point not sure what caused his symptoms, workup to this point and negative.   Earleen Newport, MD   Earleen Newport, MD 08/30/15 [phone]

## 2015-08-30 NOTE — Progress Notes (Signed)
Patient ID: JAETYN WIGGANS, male   DOB: Dec 07, 1940, 74 y.o.   MRN: [id number]  Tommi Rumps, MD Phone: [phone]  HARUN CHAFFINS is a 74 y.o. male who presents today for same-day visit.  Shortness of breath: Patient notes 2 days ago he woke up and was not breathing normally. He notes it felt like he had just done some heavy lifting and couldn't catch his breath. He notes it feels different in his chest and he feels different overall but he does not have any chest pain. There is some radiation to his shoulders of this sensation. He has only had intermittent cough. It is not been productive. He has not had any fevers or palpitations. He has no history of blood clot. He has not had any surgeries or long trips recently. He does note yesterday he had to stop exercising because he got so short of breath. He has had orthopnea as well. Did not exercise today. He notes he does feel short of breath at this time, though he does not feel under stress. He notes he typically exercises every morning and has no issues with this.  PMH: nonsmoker.   ROS see history of present illness  Objective  Physical Exam Filed Vitals:   08/30/15 1000  BP: 119/80  Pulse: 54  Temp: 98.8 F (37.1 C)  Resp: 16    Physical Exam  Constitutional:  No acute distress, he does report he feels short of breath  HENT:  Head: Normocephalic and atraumatic.  Right Ear: External ear normal.  Left Ear: External ear normal.  Mouth/Throat: Oropharynx is clear and moist. No oropharyngeal exudate.  Eyes: Conjunctivae are normal. Pupils are equal, round, and reactive to light.  Cardiovascular: Normal heart sounds.  Exam reveals no gallop and no friction rub.   No murmur heard. Bradycardic  Pulmonary/Chest: Effort normal and breath sounds normal. No respiratory distress. He has no wheezes. He has no rales.  Musculoskeletal: He exhibits no edema.  Neurological: He is alert. Gait normal.  Skin: Skin is warm and dry.   no calf  swelling. EKG: Sinus bradycardia, rate 50, inverted T-wave in aVL, ST changes of 2 mm in V3 and V4  Assessment/Plan: Please see individual problem list.  Shortness of breath Patient notes acute onset of shortness of breath 2 days ago. He denies any chest pain, though does note his chest and shoulders do not feel right. He has no known cardiac history. His no abnormal lung findings on exam. His wells score is 0. EKG does have changes from prior. Given his constellation of symptoms and EKG changes this is most concerning for cardiac cause of symptoms. Could be ischemic in nature or CHF. Given the acute onset and EKG changes he needs evaluation in the emergency room for further workup. He will be transported by EMS given that he is symptomatic at this time and with EKG changes. CMA called the ED charge nurse and let them know the patient was on his way.    Orders Placed This Encounter  Procedures  . EKG 12-Lead    Tommi Rumps

## 2015-08-30 NOTE — Assessment & Plan Note (Addendum)
Patient notes acute onset of shortness of breath 2 days ago. He denies any chest pain, though does note his chest and shoulders do not feel right. He has no known cardiac history. His no abnormal lung findings on exam. His wells score is 0. EKG does have changes from prior. Given his constellation of symptoms and EKG changes this is most concerning for cardiac cause of symptoms. Could be ischemic in nature or CHF. Given the acute onset and EKG changes he needs evaluation in the emergency room for further workup. He will be transported by EMS given that he is symptomatic at this time and with EKG changes. CMA called the ED charge nurse and let them know the patient was on his way.

## 2015-08-30 NOTE — ED Notes (Signed)
Pt from [place] via ems with reports that pt has been feeling sob since yesterday, denies chest pain/pain. Pt states sob worsens with laying flat. NAD noted at this time.

## 2015-08-30 NOTE — Discharge Instructions (Signed)

## 2015-09-10 DIAGNOSIS — J301 Allergic rhinitis due to pollen: Secondary | ICD-10-CM | POA: Diagnosis not present

## 2015-09-10 DIAGNOSIS — H6123 Impacted cerumen, bilateral: Secondary | ICD-10-CM | POA: Diagnosis not present

## 2015-10-27 ENCOUNTER — Telehealth: Payer: Self-pay | Admitting: Internal Medicine

## 2015-10-27 NOTE — Telephone Encounter (Signed)
Pt called and asked for a referral to Neurosurgery for his sciatica. Thanks!

## 2015-10-28 NOTE — Telephone Encounter (Signed)
Spoke with the patient.  His pain is on the right side.  It started on January 1st when he was cleaning his yard (constant bending picking up leaves and then went inside to vacuum).  He previously saw a neurosurgeon in [place] and had surgery on the left side approximately 10 years ago.  He doesn't have a preference in neurosurgeon here, would like your recommendation and is willing to see you first if needed.

## 2015-10-28 NOTE — Telephone Encounter (Signed)
Need more info.  What side is pain?  When flared?  Has he seen neurosurgery previously for this?  If so, who and if now who does he prefer to see?  May need to be seen here before they will see.  Will have to see based on above info.

## 2015-10-28 NOTE — Telephone Encounter (Signed)
I can see him at 4:00 on 11/02/15 or 12:00 on 11/05/15.  Thanks.  Let me know if this is a problem.

## 2015-10-28 NOTE — Telephone Encounter (Signed)
Spoke with patient and scheduled with you on the 24th at 4pm.

## 2015-10-28 NOTE — Telephone Encounter (Signed)
Please advise, I don't see any notes on this in his chart.

## 2015-11-02 ENCOUNTER — Encounter: Payer: Self-pay | Admitting: Internal Medicine

## 2015-11-02 ENCOUNTER — Ambulatory Visit (INDEPENDENT_AMBULATORY_CARE_PROVIDER_SITE_OTHER): Payer: Medicare Other | Admitting: Internal Medicine

## 2015-11-02 VITALS — BP 110/80 | HR 64 | Temp 97.8°F | Resp 18 | Ht 70.0 in | Wt 161.5 lb

## 2015-11-02 DIAGNOSIS — M5441 Lumbago with sciatica, right side: Secondary | ICD-10-CM

## 2015-11-02 DIAGNOSIS — Z13220 Encounter for screening for lipoid disorders: Secondary | ICD-10-CM | POA: Diagnosis not present

## 2015-11-02 DIAGNOSIS — E049 Nontoxic goiter, unspecified: Secondary | ICD-10-CM | POA: Diagnosis not present

## 2015-11-02 DIAGNOSIS — D72819 Decreased white blood cell count, unspecified: Secondary | ICD-10-CM

## 2015-11-02 DIAGNOSIS — R030 Elevated blood-pressure reading, without diagnosis of hypertension: Secondary | ICD-10-CM

## 2015-11-02 DIAGNOSIS — E7800 Pure hypercholesterolemia, unspecified: Secondary | ICD-10-CM

## 2015-11-02 DIAGNOSIS — IMO0001 Reserved for inherently not codable concepts without codable children: Secondary | ICD-10-CM

## 2015-11-02 NOTE — Progress Notes (Signed)
Patient ID: Jose Perry, male   DOB: 06-17-1941, 75 y.o.   MRN: [id number]   Subjective:    Patient ID: Jose Perry, male    DOB: March 26, 1941, 75 y.o.   MRN: [id number]  HPI  Patient with past history of GERD and previous documented elevated blood pressure.  He comes in today to discuss recent right hip and right leg pain.  States was working [employer] Years Day.  Was up and down.  Developed increased pain right hip and pain radiating down his right leg.  No numbness.  Is better now.  Back is stiff.  States has had problems previously with similar pain.  Saw [facility].  Request referral back to neurosurgery.     Past Medical History  Diagnosis Date  . GERD (gastroesophageal reflux disease)   . Glaucoma   . Thyroid goiter   . Hx of colonic polyps   . Enlarged prostate   . Elevated blood pressure   . Leukopenia     has had an extensive w/up.     Past Surgical History  Procedure Laterality Date  . Eye surgery  2009    relieve pressure  . Back surgery  2002    ruptured disc  . Thyroid surgery  1968    goiter   Family History  Problem Relation Age of Onset  . Heart disease Mother   . Alcohol abuse Father   . Hyperlipidemia Father   . Stroke Father    Social History   Social History  . Marital Status: Married    Spouse Name: N/A  . Number of Children: N/A  . Years of Education: N/A   Social History Main Topics  . Smoking status: Never Smoker   . Smokeless tobacco: Never Used  . Alcohol Use: No  . Drug Use: No  . Sexual Activity: Not Asked   Other Topics Concern  . None   Social History Narrative    Outpatient Encounter Prescriptions as of 11/02/2015  Medication Sig  . bimatoprost (LUMIGAN) 0.03 % ophthalmic solution Place 1 drop into the right eye at bedtime.   . TAMSULOSIN HCL PO Take 0.4 mg by mouth daily.   Marland Kitchen omeprazole (PRILOSEC) 40 MG capsule Take 40 mg by mouth daily. Reported on 11/02/2015   No facility-administered encounter medications on file as of 11/02/2015.      Review of Systems  Constitutional: Negative for appetite change and unexpected weight change.  Respiratory: Negative for cough, chest tightness and shortness of breath.   Cardiovascular: Negative for chest pain, palpitations and leg swelling.  Gastrointestinal: Negative for nausea, vomiting and abdominal pain.  Genitourinary: Negative for dysuria and difficulty urinating.  Musculoskeletal: Positive for back pain.       Right hip and right leg pain.    Skin: Negative for color change and rash.       Objective:    Physical Exam  Constitutional: He appears well-developed and well-nourished. No distress.  HENT:  Nose: Nose normal.  Mouth/Throat: Oropharynx is clear and moist.  Eyes: Conjunctivae are normal. Right eye exhibits no discharge. Left eye exhibits no discharge.  Neck: Neck supple.  Cardiovascular: Normal rate and regular rhythm.   Pulmonary/Chest: Effort normal and breath sounds normal. No respiratory distress.  Abdominal: Soft. Bowel sounds are normal. There is no tenderness.  Musculoskeletal: He exhibits no edema or tenderness.  No pain with straight leg raise.  No pain with resistance against flexion and extension.    Lymphadenopathy:  He has no cervical adenopathy.  Psychiatric: He has a normal mood and affect. His behavior is normal.    BP 110/80 mmHg  Pulse 64  Temp(Src) 97.8 F (36.6 C) (Oral)  Resp 18  Ht 5\' 10"  (1.778 m)  Wt 161 lb 8 oz (73.256 kg)  BMI 23.17 kg/m2  SpO2 97% Wt Readings from Last 3 Encounters:  11/02/15 161 lb 8 oz (73.256 kg)  08/30/15 158 lb (71.668 kg)  08/30/15 158 lb 12.8 oz (72.031 kg)     Lab Results  Component Value Date   WBC 3.4* 08/30/2015   HGB 14.9 08/30/2015   HCT 46.1 08/30/2015   PLT 111* 08/30/2015   GLUCOSE 74 08/30/2015   CHOL 196 08/11/2014   TRIG 22.0 08/11/2014   HDL 84.20 08/11/2014   LDLCALC 107* 08/11/2014   ALT 25 08/30/2015   AST 44* 08/30/2015   NA 139 08/30/2015   K 3.6 08/30/2015   CL  104 08/30/2015   CREATININE 1.34* 08/30/2015   BUN 15 08/30/2015   CO2 28 08/30/2015   TSH 1.95 08/11/2014   PSA 2.75 08/11/2014    Dg Chest 2 View  08/30/2015  CLINICAL DATA:  Shortness of Breath EXAM: CHEST  2 VIEW COMPARISON:  03/22/2014 FINDINGS: The heart size and mediastinal contours are within normal limits. Both lungs are clear. The visualized skeletal structures are unremarkable. IMPRESSION: No active cardiopulmonary disease. Electronically Signed   By: Lahoma Crocker [name]   On: 08/30/2015 11:41   Ct Angio Chest Pe W/cm &/or Wo Cm  08/30/2015  CLINICAL DATA:  Shortness of breath which increases while recumbent EXAM: CT ANGIOGRAPHY CHEST WITH CONTRAST TECHNIQUE: Multidetector CT imaging of the chest was performed using the standard protocol during bolus administration of intravenous contrast. Multiplanar CT image reconstructions and MIPs were obtained to evaluate the vascular anatomy. CONTRAST:  1105mL OMNIPAQUE IOHEXOL 350 MG/ML SOLN COMPARISON:  10/19/2011. FINDINGS: Lungs are well aerated bilaterally without focal infiltrate or sizable effusion. The thoracic inlet shows enlargement of the left lobe of the thyroid with a dominant nodule within which measures at least 3.7 cm. It has an intrathoracic component. The thoracic aorta and its branches are within normal limits. No dissection or aneurysmal dilatation is seen. Mild coronary calcifications are noted. The pulmonary artery is well visualized and shows a normal branching pattern. No filling defects to suggest pulmonary emboli are identified. No significant hilar or mediastinal adenopathy is noted. Scanning into the upper abdomen demonstrates some hypervascularity of the left lobe of the liver which is stable from a previous exam. Some peripheral nodular enhancement is seen most consistent with a large hemangioma. This has increased in size when compared with the prior CT examination and now measures approximately 7.7 by 4.1 cm. The remainder of  the upper abdomen is within normal limits. No acute bony abnormality is seen. Review of the MIP images confirms the above findings. IMPRESSION: No evidence of pulmonary emboli. Enlarged left thyroid gland with apparent dominant nodule as described. Nonemergent ultrasound is recommended for further evaluation as clinically indicated. Large hypervascular area within the left lobe of the liver. 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 Electronically Signed   By: Inez Catalina [name]   On: 08/30/2015 14:15       Assessment & Plan:   Problem List Items Addressed This Visit    Back pain - Primary    Right sided back pain and pain down leg as outlined.  Pain better.  Check  L-S spine xray.  Refer to [facility] per pts request.  Has seen previously.        Relevant Orders   DG Lumbar Spine 2-3 Views (Completed)   Comprehensive metabolic panel   Elevated blood pressure   Goiter    S/p 80% thyroid removed.  Follow tsh.  CT as outlined.  Will need thyroid ultrasound.        Relevant Orders   TSH   Leukopenia    Has been worked up by hematology.  Had bone marrow, etc.  Follow cbc.        Relevant Orders   CBC with Differential/Platelet    Other Visit Diagnoses    Screening cholesterol level        Pure hypercholesterolemia        Relevant Orders    Lipid panel        Einar Pheasant, MD

## 2015-11-02 NOTE — Progress Notes (Signed)
Pre-visit discussion using our clinic review tool. No additional management support is needed unless otherwise documented below in the visit note.  

## 2015-11-03 ENCOUNTER — Ambulatory Visit
Admission: RE | Admit: 2015-11-03 | Discharge: 2015-11-03 | Disposition: A | Discharge status: Home / Self Care | Payer: Medicare Other | Source: Ambulatory Visit | Attending: Internal Medicine | Admitting: Internal Medicine

## 2015-11-03 DIAGNOSIS — M5136 Other intervertebral disc degeneration, lumbar region: Secondary | ICD-10-CM | POA: Diagnosis not present

## 2015-11-03 DIAGNOSIS — M545 Low back pain: Secondary | ICD-10-CM | POA: Insufficient documentation

## 2015-11-03 DIAGNOSIS — M47816 Spondylosis without myelopathy or radiculopathy, lumbar region: Secondary | ICD-10-CM | POA: Insufficient documentation

## 2015-11-03 DIAGNOSIS — M5441 Lumbago with sciatica, right side: Secondary | ICD-10-CM

## 2015-11-07 ENCOUNTER — Encounter: Payer: Self-pay | Admitting: Internal Medicine

## 2015-11-07 DIAGNOSIS — M549 Dorsalgia, unspecified: Secondary | ICD-10-CM | POA: Insufficient documentation

## 2015-11-07 NOTE — Assessment & Plan Note (Signed)
Right sided back pain and pain down leg as outlined.  Pain better.  Check L-S spine xray.  Refer to [facility] per pts request.  Has seen previously.

## 2015-11-07 NOTE — Assessment & Plan Note (Signed)
Has been worked up by hematology.  Had bone marrow, etc.  Follow cbc.

## 2015-11-07 NOTE — Assessment & Plan Note (Signed)
S/p 80% thyroid removed.  Follow tsh.  CT as outlined.  Will need thyroid ultrasound.

## 2015-11-09 ENCOUNTER — Other Ambulatory Visit (INDEPENDENT_AMBULATORY_CARE_PROVIDER_SITE_OTHER): Payer: Medicare Other

## 2015-11-09 DIAGNOSIS — H16212 Exposure keratoconjunctivitis, left eye: Secondary | ICD-10-CM | POA: Diagnosis not present

## 2015-11-09 DIAGNOSIS — M5441 Lumbago with sciatica, right side: Secondary | ICD-10-CM

## 2015-11-09 DIAGNOSIS — E049 Nontoxic goiter, unspecified: Secondary | ICD-10-CM | POA: Diagnosis not present

## 2015-11-09 DIAGNOSIS — D72819 Decreased white blood cell count, unspecified: Secondary | ICD-10-CM | POA: Diagnosis not present

## 2015-11-09 DIAGNOSIS — E7800 Pure hypercholesterolemia, unspecified: Secondary | ICD-10-CM | POA: Diagnosis not present

## 2015-11-09 LAB — COMPREHENSIVE METABOLIC PANEL
ALT: 20 U/L (ref 0–53)
AST: 35 U/L (ref 0–37)
Albumin: 4.2 g/dL (ref 3.5–5.2)
Alkaline Phosphatase: 54 U/L (ref 39–117)
BILIRUBIN TOTAL: 0.6 mg/dL (ref 0.2–1.2)
BUN: 23 mg/dL (ref 6–23)
CALCIUM: 10.5 mg/dL (ref 8.4–10.5)
CO2: 28 meq/L (ref 19–32)
Chloride: 103 mEq/L (ref 96–112)
Creatinine, Ser: 1.44 mg/dL (ref 0.40–1.50)
GFR: 61.71 mL/min (ref >60.00)
GLUCOSE: 84 mg/dL (ref 70–99)
POTASSIUM: 4.2 meq/L (ref 3.5–5.1)
Sodium: 138 mEq/L (ref 135–145)
Total Protein: 7.2 g/dL (ref 6.0–8.3)

## 2015-11-09 LAB — CBC WITH DIFFERENTIAL/PLATELET
BASOS ABS: 0 10*3/uL (ref 0.0–0.1)
Basophils Relative: 0.7 % (ref 0.0–3.0)
EOS PCT: 2.7 % (ref 0.0–5.0)
Eosinophils Absolute: 0.1 10*3/uL (ref 0.0–0.7)
HEMATOCRIT: 41.9 % (ref 39.0–52.0)
HEMOGLOBIN: 13.6 g/dL (ref 13.0–17.0)
LYMPHS ABS: 0.9 10*3/uL (ref 0.7–4.0)
LYMPHS PCT: 33.6 % (ref 12.0–46.0)
MCHC: 32.3 g/dL (ref 30.0–36.0)
MCV: 83.6 fl (ref 78.0–100.0)
MONOS PCT: 8.5 % (ref 3.0–12.0)
Monocytes Absolute: 0.2 10*3/uL (ref 0.1–1.0)
NEUTROS PCT: 54.5 % (ref 43.0–77.0)
Neutro Abs: 1.4 10*3/uL (ref 1.4–7.7)
Platelets: 129 10*3/uL — ABNORMAL LOW (ref 150.0–400.0)
RBC: 5.02 Mil/uL (ref 4.22–5.81)
RDW: 14.6 % (ref 11.5–15.5)
WBC: 2.6 10*3/uL — AB (ref 4.0–10.5)

## 2015-11-09 LAB — LIPID PANEL
CHOL/HDL RATIO: 2
Cholesterol: 185 mg/dL (ref 0–200)
HDL: 81.6 mg/dL (ref >39.00)
LDL CALC: 95 mg/dL (ref 0–99)
NONHDL: 103.37
Triglycerides: 43 mg/dL (ref 0.0–149.0)
VLDL: 8.6 mg/dL (ref 0.0–40.0)

## 2015-11-09 LAB — TSH: TSH: 1.2 u[IU]/mL (ref 0.35–4.50)

## 2015-11-10 ENCOUNTER — Other Ambulatory Visit: Payer: Self-pay | Admitting: Internal Medicine

## 2015-11-10 DIAGNOSIS — D72819 Decreased white blood cell count, unspecified: Secondary | ICD-10-CM

## 2015-11-10 DIAGNOSIS — M545 Low back pain: Secondary | ICD-10-CM | POA: Diagnosis not present

## 2015-11-10 NOTE — Progress Notes (Signed)
Order placed for f/u [facility].   

## 2015-12-02 ENCOUNTER — Other Ambulatory Visit (INDEPENDENT_AMBULATORY_CARE_PROVIDER_SITE_OTHER): Payer: Medicare Other

## 2015-12-02 DIAGNOSIS — D72819 Decreased white blood cell count, unspecified: Secondary | ICD-10-CM | POA: Diagnosis not present

## 2015-12-02 LAB — CBC WITH DIFFERENTIAL/PLATELET
BASOS ABS: 0 10*3/uL (ref 0.0–0.1)
BASOS PCT: 0.7 % (ref 0.0–3.0)
Eosinophils Absolute: 0.1 10*3/uL (ref 0.0–0.7)
Eosinophils Relative: 5.1 % — ABNORMAL HIGH (ref 0.0–5.0)
HCT: 41.3 % (ref 39.0–52.0)
HEMOGLOBIN: 13.7 g/dL (ref 13.0–17.0)
LYMPHS ABS: 0.8 10*3/uL (ref 0.7–4.0)
Lymphocytes Relative: 32.6 % (ref 12.0–46.0)
MCHC: 33 g/dL (ref 30.0–36.0)
MCV: 82.2 fl (ref 78.0–100.0)
Monocytes Absolute: 0.2 10*3/uL (ref 0.1–1.0)
Monocytes Relative: 8.7 % (ref 3.0–12.0)
NEUTROS PCT: 52.9 % (ref 43.0–77.0)
Neutro Abs: 1.3 10*3/uL — ABNORMAL LOW (ref 1.4–7.7)
Platelets: 131 10*3/uL — ABNORMAL LOW (ref 150.0–400.0)
RBC: 5.03 Mil/uL (ref 4.22–5.81)
RDW: 14.7 % (ref 11.5–15.5)

## 2015-12-09 ENCOUNTER — Encounter: Payer: Medicare Other | Admitting: Internal Medicine

## 2015-12-09 ENCOUNTER — Ambulatory Visit (INDEPENDENT_AMBULATORY_CARE_PROVIDER_SITE_OTHER): Payer: Medicare Other | Admitting: Internal Medicine

## 2015-12-09 ENCOUNTER — Encounter: Payer: Self-pay | Admitting: Internal Medicine

## 2015-12-09 VITALS — BP 128/80 | HR 55 | Temp 97.4°F | Resp 18 | Ht 70.0 in | Wt 161.5 lb

## 2015-12-09 DIAGNOSIS — D72819 Decreased white blood cell count, unspecified: Secondary | ICD-10-CM

## 2015-12-09 DIAGNOSIS — Z86010 Personal history of colonic polyps: Secondary | ICD-10-CM

## 2015-12-09 DIAGNOSIS — R030 Elevated blood-pressure reading, without diagnosis of hypertension: Secondary | ICD-10-CM

## 2015-12-09 DIAGNOSIS — R0989 Other specified symptoms and signs involving the circulatory and respiratory systems: Secondary | ICD-10-CM

## 2015-12-09 DIAGNOSIS — E049 Nontoxic goiter, unspecified: Secondary | ICD-10-CM

## 2015-12-09 DIAGNOSIS — M5441 Lumbago with sciatica, right side: Secondary | ICD-10-CM

## 2015-12-09 DIAGNOSIS — IMO0001 Reserved for inherently not codable concepts without codable children: Secondary | ICD-10-CM

## 2015-12-09 DIAGNOSIS — Z0000 Encounter for general adult medical examination without abnormal findings: Secondary | ICD-10-CM

## 2015-12-09 DIAGNOSIS — K219 Gastro-esophageal reflux disease without esophagitis: Secondary | ICD-10-CM | POA: Diagnosis not present

## 2015-12-09 NOTE — Progress Notes (Signed)
Pre-visit discussion using our clinic review tool. No additional management support is needed unless otherwise documented below in the visit note.  

## 2015-12-09 NOTE — Progress Notes (Signed)
Patient ID: Jose Perry, male   DOB: [birth year], 75 y.o.   MRN: [id number]   Subjective:    Patient ID: Jose Perry, male    DOB: 11/11/1940, 75 y.o.   MRN: [id number]  HPI  Patient her for scheduled physical.  He sees urology for prostate checks.  Doing well.  Feels good.  Exercises regularly.  Stays active.  Back is better.  Saw [facility].  Felt no further w/up warranted.  No chest pain or tightness.  No sob.  No acid reflux.  No abdominal pain or cramping.  Bowels stable.  Blood pressure on outside checks - 119-125/79.     Past Medical History  Diagnosis Date  . GERD (gastroesophageal reflux disease)   . Glaucoma   . Thyroid goiter   . Hx of colonic polyps   . Enlarged prostate   . Elevated blood pressure   . Leukopenia     has had an extensive w/up.     Past Surgical History  Procedure Laterality Date  . Eye surgery  2009    relieve pressure  . Back surgery  2002    ruptured disc  . Thyroid surgery  1968    goiter   Family History  Problem Relation Age of Onset  . Heart disease Mother   . Alcohol abuse Father   . Hyperlipidemia Father   . Stroke Father    Social History   Social History  . Marital Status: Married    Spouse Name: N/A  . Number of Children: N/A  . Years of Education: N/A   Social History Main Topics  . Smoking status: Never Smoker   . Smokeless tobacco: Never Used  . Alcohol Use: No  . Drug Use: No  . Sexual Activity: Not Asked   Other Topics Concern  . None   Social History Narrative     Review of Systems  Constitutional: Negative for appetite change and unexpected weight change.  HENT: Negative for congestion and sinus pressure.   Eyes: Negative for pain and visual disturbance.  Respiratory: Negative for cough, chest tightness and shortness of breath.   Cardiovascular: Negative for chest pain, palpitations and leg swelling.  Gastrointestinal: Negative for nausea, vomiting, abdominal pain and diarrhea.  Genitourinary: Negative for  dysuria and difficulty urinating.  Musculoskeletal: Negative for joint swelling.       Back pain better.   Skin: Negative for color change and rash.  Neurological: Negative for dizziness, light-headedness and headaches.  Hematological: Negative for adenopathy. Does not bruise/bleed easily.  Psychiatric/Behavioral: Negative for dysphoric mood and agitation.       Objective:    Physical Exam  Constitutional: He is oriented to person, place, and time. He appears well-developed and well-nourished. No distress.  HENT:  Head: Normocephalic and atraumatic.  Nose: Nose normal.  Mouth/Throat: Oropharynx is clear and moist. No oropharyngeal exudate.  Eyes: Conjunctivae are normal. Right eye exhibits no discharge. Left eye exhibits no discharge.  Neck: Neck supple. No thyromegaly present.  Cardiovascular: Normal rate and regular rhythm.   Pulmonary/Chest: Effort normal and breath sounds normal. No respiratory distress. He has no wheezes.  Abdominal: Soft. Bowel sounds are normal. There is no tenderness.  Genitourinary:  Sees urology.   Musculoskeletal: He exhibits no edema or tenderness.  Lymphadenopathy:    He has no cervical adenopathy.  Neurological: He is alert and oriented to person, place, and time.  Skin: Skin is warm and dry. No rash noted. No erythema.  Psychiatric: He  has a normal mood and affect. His behavior is normal.    BP 128/80 mmHg  Pulse 55  Temp(Src) 97.4 F (36.3 C) (Oral)  Resp 18  Ht 5\' 10"  (1.778 m)  Wt 161 lb 8 oz (73.256 kg)  BMI 23.17 kg/m2  SpO2 93% Wt Readings from Last 3 Encounters:  12/09/15 161 lb 8 oz (73.256 kg)  11/02/15 161 lb 8 oz (73.256 kg)  08/30/15 158 lb (71.668 kg)     Lab Results  Component Value Date   WBC 2.4 Repeated and verified X2.* 12/02/2015   HGB 13.7 12/02/2015   HCT 41.3 12/02/2015   PLT 131.0* 12/02/2015   GLUCOSE 84 11/09/2015   CHOL 185 11/09/2015   TRIG 43.0 11/09/2015   HDL 81.60 11/09/2015   LDLCALC 95  11/09/2015   ALT 20 11/09/2015   AST 35 11/09/2015   NA 138 11/09/2015   K 4.2 11/09/2015   CL 103 11/09/2015   CREATININE 1.44 11/09/2015   BUN 23 11/09/2015   CO2 28 11/09/2015   TSH 1.20 11/09/2015   PSA 2.75 08/11/2014    Dg Lumbar Spine 2-3 Views  11/03/2015  CLINICAL DATA:  Low back pain radiating down the right leg. EXAM: LUMBAR SPINE - 2-3 VIEW COMPARISON:  None. FINDINGS: There is no evidence of lumbar spine fracture. There is straightening of the cervical lordosis with 1-2 mm retropulsion of L5 on L4. Multilevel osteoarthritic changes worse in the lower lumbosacral spine are noted with disc space narrowing, endplate sclerosis and cyst formation, and small osteophyte formation. There worse affected levels are L4-L5 and L5-S1, where there is an associated advanced posterior facet arthropathy. IMPRESSION: No evidence of fracture of the lumbosacral spine. Moderate to severe osteoarthritic changes, worse in the lower lumbosacral spine. Likely degenerative 1-2 mm retropulsion of L5 on L4. Electronically Signed   By: Fidela Salisbury [name]   On: 11/03/2015 16:25       Assessment & Plan:   Problem List Items Addressed This Visit    Back pain    Back better.  Xray as outlined.  Saw [facility].  Follow.        Bruit    Had aortic ultrasound.  Need results.        Elevated blood pressure    Has a history of elevated blood pressure.  Blood pressure doing well.  Follow.        GERD (gastroesophageal reflux disease) - Primary    On omeprazole.  No upper symptoms reported.        Goiter    S/p 80% thyroid removed.  Follow tsh.  Previous CT with enlarged left thyroid lobe and what appeared to be nodule present.  Needs thyroid ultrasound.        Health care maintenance    PSA and prostate checks - Dr Jacqlyn Larsen.  Colonoscopy 06/2014.  Recommended f/u in 2020.        History of colonic polyps    Colonoscopy 07/08/14 - tubular adenoma x 2.  Recommended f/u colonoscopy in 06/2019.         Leukopenia    Has been worked up by hematology.  Had bone marrow, etc.  Recheck counts stable.  Follow.            Einar Pheasant, MD

## 2015-12-12 ENCOUNTER — Encounter: Payer: Self-pay | Admitting: Internal Medicine

## 2015-12-12 NOTE — Assessment & Plan Note (Signed)
Back better.  Xray as outlined.  Saw [facility].  Follow.

## 2015-12-12 NOTE — Assessment & Plan Note (Signed)
Colonoscopy 07/08/14 - tubular adenoma x 2.  Recommended f/u colonoscopy in 06/2019.   

## 2015-12-12 NOTE — Assessment & Plan Note (Signed)
Had aortic ultrasound.  Need results.

## 2015-12-12 NOTE — Assessment & Plan Note (Signed)
Has been worked up by hematology.  Had bone marrow, etc.  Recheck counts stable.  Follow.

## 2015-12-12 NOTE — Assessment & Plan Note (Signed)
PSA and prostate checks - Dr Jacqlyn Larsen.  Colonoscopy 06/2014.  Recommended f/u in 2020.

## 2015-12-12 NOTE — Assessment & Plan Note (Signed)
Has a history of elevated blood pressure.  Blood pressure doing well.  Follow.

## 2015-12-12 NOTE — Assessment & Plan Note (Signed)
S/p 80% thyroid removed.  Follow tsh.  Previous CT with enlarged left thyroid lobe and what appeared to be nodule present.  Needs thyroid ultrasound.

## 2015-12-12 NOTE — Assessment & Plan Note (Signed)
On omeprazole.  No upper symptoms reported.   

## 2015-12-15 ENCOUNTER — Telehealth: Payer: Self-pay | Admitting: *Deleted

## 2015-12-15 NOTE — Telephone Encounter (Signed)
-----   Message from Einar Pheasant, MD sent at 12/12/2015  7:30 AM EST ----- Regarding: aortic ultrasound Need results of aortic ultrasound - performed at [facility].   Thanks    Dr Nicki Reaper

## 2015-12-15 NOTE — Telephone Encounter (Signed)
Requested results via epic

## 2015-12-20 ENCOUNTER — Telehealth: Payer: Self-pay | Admitting: *Deleted

## 2015-12-20 NOTE — Telephone Encounter (Signed)
Request faxed to [facility]

## 2015-12-20 NOTE — Telephone Encounter (Signed)
Ultrasound received & placed in yellow folder

## 2015-12-20 NOTE — Telephone Encounter (Signed)
-----   Message from Einar Pheasant, MD sent at 12/12/2015  7:30 AM EST ----- Regarding: aortic ultrasound Need results of aortic ultrasound - performed at [facility].   Thanks    Dr Nicki Reaper

## 2015-12-21 NOTE — Telephone Encounter (Signed)
Pt already aware ultrasound results.

## 2015-12-30 ENCOUNTER — Other Ambulatory Visit: Payer: Medicare Other

## 2016-01-19 ENCOUNTER — Encounter: Payer: Self-pay | Admitting: *Deleted

## 2016-02-24 DIAGNOSIS — H401111 Primary open-angle glaucoma, right eye, mild stage: Secondary | ICD-10-CM | POA: Diagnosis not present

## 2016-04-10 DIAGNOSIS — H401112 Primary open-angle glaucoma, right eye, moderate stage: Secondary | ICD-10-CM | POA: Insufficient documentation

## 2016-04-10 DIAGNOSIS — H50112 Monocular exotropia, left eye: Secondary | ICD-10-CM | POA: Diagnosis not present

## 2016-04-10 DIAGNOSIS — H401132 Primary open-angle glaucoma, bilateral, moderate stage: Secondary | ICD-10-CM | POA: Diagnosis not present

## 2016-04-10 DIAGNOSIS — H43393 Other vitreous opacities, bilateral: Secondary | ICD-10-CM | POA: Diagnosis not present

## 2016-04-17 DIAGNOSIS — H401111 Primary open-angle glaucoma, right eye, mild stage: Secondary | ICD-10-CM | POA: Diagnosis not present

## 2016-05-23 DIAGNOSIS — H401123 Primary open-angle glaucoma, left eye, severe stage: Secondary | ICD-10-CM | POA: Diagnosis not present

## 2016-06-13 ENCOUNTER — Encounter: Payer: Self-pay | Admitting: Internal Medicine

## 2016-06-13 ENCOUNTER — Ambulatory Visit (INDEPENDENT_AMBULATORY_CARE_PROVIDER_SITE_OTHER): Payer: Medicare Other | Admitting: Internal Medicine

## 2016-06-13 VITALS — BP 98/68 | HR 56 | Temp 98.0°F | Ht 70.0 in | Wt 159.0 lb

## 2016-06-13 DIAGNOSIS — Z23 Encounter for immunization: Secondary | ICD-10-CM

## 2016-06-13 DIAGNOSIS — D72819 Decreased white blood cell count, unspecified: Secondary | ICD-10-CM | POA: Diagnosis not present

## 2016-06-13 DIAGNOSIS — E041 Nontoxic single thyroid nodule: Secondary | ICD-10-CM

## 2016-06-13 DIAGNOSIS — R030 Elevated blood-pressure reading, without diagnosis of hypertension: Secondary | ICD-10-CM

## 2016-06-13 DIAGNOSIS — IMO0001 Reserved for inherently not codable concepts without codable children: Secondary | ICD-10-CM

## 2016-06-13 LAB — CBC WITH DIFFERENTIAL/PLATELET
BASOS ABS: 0 10*3/uL (ref 0.0–0.1)
BASOS PCT: 0.8 % (ref 0.0–3.0)
EOS ABS: 0.1 10*3/uL (ref 0.0–0.7)
Eosinophils Relative: 4.7 % (ref 0.0–5.0)
HCT: 39.7 % (ref 39.0–52.0)
Hemoglobin: 13.1 g/dL (ref 13.0–17.0)
LYMPHS ABS: 1.1 10*3/uL (ref 0.7–4.0)
Lymphocytes Relative: 36.6 % (ref 12.0–46.0)
MCHC: 33.1 g/dL (ref 30.0–36.0)
MCV: 82 fl (ref 78.0–100.0)
MONOS PCT: 9.5 % (ref 3.0–12.0)
Monocytes Absolute: 0.3 10*3/uL (ref 0.1–1.0)
NEUTROS ABS: 1.5 10*3/uL (ref 1.4–7.7)
NEUTROS PCT: 48.4 % (ref 43.0–77.0)
PLATELETS: 132 10*3/uL — AB (ref 150.0–400.0)
RBC: 4.84 Mil/uL (ref 4.22–5.81)
RDW: 15.3 % (ref 11.5–15.5)
WBC: 3.1 10*3/uL — ABNORMAL LOW (ref 4.0–10.5)

## 2016-06-13 LAB — TSH: TSH: 1.13 u[IU]/mL (ref 0.35–4.50)

## 2016-06-13 NOTE — Progress Notes (Signed)
Patient ID: Jose Perry, male   DOB: 1941-09-24, 75 y.o.   MRN: [id number]   Subjective:    Patient ID: Jose Perry, male    DOB: October 04, 1941, 75 y.o.   MRN: [id number]  HPI  Patient here for a scheduled follow up.  States he is doing well.  Stays active.  Sees Dr Jacqlyn Larsen.  Last check 07/2015.  PSA - 2.14.  No chest pain.  No sob.  No acid reflux.  No abdominal pain or cramping.  Bowels stable.  Discussed the need for thyroid ultrasound.  No swallowing problems.     Past Medical History:  Diagnosis Date  . Elevated blood pressure   . Enlarged prostate   . GERD (gastroesophageal reflux disease)   . Glaucoma   . Hx of colonic polyps   . Leukopenia    has had an extensive w/up.    . Thyroid goiter    Past Surgical History:  Procedure Laterality Date  . BACK SURGERY  2002   ruptured disc  . EYE SURGERY  2009   relieve pressure  . THYROID SURGERY  1968   goiter   Family History  Problem Relation Age of Onset  . Heart disease Mother   . Alcohol abuse Father   . Hyperlipidemia Father   . Stroke Father    Social History   Social History  . Marital status: Married    Spouse name: N/A  . Number of children: N/A  . Years of education: N/A   Social History Main Topics  . Smoking status: Never Smoker  . Smokeless tobacco: Never Used  . Alcohol use No  . Drug use: No  . Sexual activity: Not Asked   Other Topics Concern  . None   Social History Narrative  . None    Outpatient Encounter Prescriptions as of 06/13/2016  Medication Sig  . bimatoprost (LUMIGAN) 0.03 % ophthalmic solution Place 1 drop into the right eye at bedtime.   Marland Kitchen SIMBRINZA 1-0.2 % SUSP 2 drops.  . TAMSULOSIN HCL PO Take 0.4 mg by mouth daily.   . [DISCONTINUED] omeprazole (PRILOSEC) 40 MG capsule Take 40 mg by mouth daily. Reported on 11/02/2015   No facility-administered encounter medications on file as of 06/13/2016.     Review of Systems  Constitutional: Negative for appetite change and unexpected  weight change.  HENT: Negative for congestion and sinus pressure.   Respiratory: Negative for cough, chest tightness and shortness of breath.   Cardiovascular: Negative for chest pain, palpitations and leg swelling.  Gastrointestinal: Negative for abdominal pain, diarrhea, nausea and vomiting.  Genitourinary: Negative for difficulty urinating and dysuria.  Musculoskeletal: Negative for back pain and joint swelling.  Skin: Negative for color change and rash.  Neurological: Negative for dizziness, light-headedness and headaches.  Psychiatric/Behavioral: Negative for agitation and dysphoric mood.       Objective:    Physical Exam  Constitutional: He appears well-developed and well-nourished. No distress.  HENT:  Nose: Nose normal.  Mouth/Throat: Oropharynx is clear and moist.  Neck: Neck supple.  Cardiovascular: Normal rate and regular rhythm.   Pulmonary/Chest: Effort normal and breath sounds normal. No respiratory distress.  Abdominal: Soft. Bowel sounds are normal. There is no tenderness.  Musculoskeletal: He exhibits no edema.  Lymphadenopathy:    He has no cervical adenopathy.  Skin: No rash noted. No erythema.  Psychiatric: He has a normal mood and affect. His behavior is normal.    BP 98/68   Pulse Marland Kitchen)  56   Temp 98 F (36.7 C) (Oral)   Ht 5\' 10"  (1.778 m)   Wt 159 lb (72.1 kg)   SpO2 97%   BMI 22.81 kg/m  Wt Readings from Last 3 Encounters:  06/13/16 159 lb (72.1 kg)  12/09/15 161 lb 8 oz (73.3 kg)  11/02/15 161 lb 8 oz (73.3 kg)     Lab Results  Component Value Date   WBC 3.1 (L) 06/13/2016   HGB 13.1 06/13/2016   HCT 39.7 06/13/2016   PLT 132.0 (L) 06/13/2016   GLUCOSE 84 11/09/2015   CHOL 185 11/09/2015   TRIG 43.0 11/09/2015   HDL 81.60 11/09/2015   LDLCALC 95 11/09/2015   ALT 20 11/09/2015   AST 35 11/09/2015   NA 138 11/09/2015   K 4.2 11/09/2015   CL 103 11/09/2015   CREATININE 1.44 11/09/2015   BUN 23 11/09/2015   CO2 28 11/09/2015   TSH  1.13 06/13/2016   PSA 2.75 08/11/2014    Dg Lumbar Spine 2-3 Views  Result Date: 11/03/2015 CLINICAL DATA:  Low back pain radiating down the right leg. EXAM: LUMBAR SPINE - 2-3 VIEW COMPARISON:  None. FINDINGS: There is no evidence of lumbar spine fracture. There is straightening of the cervical lordosis with 1-2 mm retropulsion of L5 on L4. Multilevel osteoarthritic changes worse in the lower lumbosacral spine are noted with disc space narrowing, endplate sclerosis and cyst formation, and small osteophyte formation. There worse affected levels are L4-L5 and L5-S1, where there is an associated advanced posterior facet arthropathy. IMPRESSION: No evidence of fracture of the lumbosacral spine. Moderate to severe osteoarthritic changes, worse in the lower lumbosacral spine. Likely degenerative 1-2 mm retropulsion of L5 on L4. Electronically Signed   By: Fidela Salisbury [name]   On: 11/03/2015 16:25       Assessment & Plan:   Problem List Items Addressed This Visit    Elevated blood pressure    Blood pressure doing well.  Follow.       Leukopenia - Primary    Has been worked up by hematology.  Had bone marrow, etc.  Follow cbc.  Recheck today.       Relevant Orders   CBC with Differential/Platelet (Completed)   Thyroid nodule    Thyroid nodule noted on previous CT scan.  Needs thyroid ultrasound.  Check tsh.       Relevant Orders   TSH (Completed)   US Soft Tissue Head/Neck    Other Visit Diagnoses    Encounter for immunization       Relevant Orders   Flu vaccine HIGH DOSE PF (Completed)       Einar Pheasant, MD

## 2016-06-13 NOTE — Progress Notes (Signed)
Pre visit review using our clinic review tool, if applicable. No additional management support is needed unless otherwise documented below in the visit note. 

## 2016-06-14 ENCOUNTER — Encounter: Payer: Self-pay | Admitting: *Deleted

## 2016-06-17 ENCOUNTER — Encounter: Payer: Self-pay | Admitting: Internal Medicine

## 2016-06-17 NOTE — Assessment & Plan Note (Signed)
Blood pressure doing well.  Follow.  

## 2016-06-17 NOTE — Assessment & Plan Note (Signed)
Has been worked up by hematology.  Had bone marrow, etc.  Follow cbc.  Recheck today.

## 2016-06-17 NOTE — Assessment & Plan Note (Signed)
Thyroid nodule noted on previous CT scan.  Needs thyroid ultrasound.  Check tsh.

## 2016-06-19 ENCOUNTER — Ambulatory Visit: Payer: Medicare Other

## 2016-06-20 ENCOUNTER — Ambulatory Visit
Admission: RE | Admit: 2016-06-20 | Discharge: 2016-06-20 | Disposition: A | Discharge status: Home / Self Care | Payer: Medicare Other | Source: Ambulatory Visit | Attending: Internal Medicine | Admitting: Internal Medicine

## 2016-06-20 DIAGNOSIS — E042 Nontoxic multinodular goiter: Secondary | ICD-10-CM | POA: Diagnosis not present

## 2016-06-20 DIAGNOSIS — E041 Nontoxic single thyroid nodule: Secondary | ICD-10-CM

## 2016-06-22 ENCOUNTER — Other Ambulatory Visit: Payer: Self-pay | Admitting: Internal Medicine

## 2016-06-22 DIAGNOSIS — E041 Nontoxic single thyroid nodule: Secondary | ICD-10-CM

## 2016-06-26 ENCOUNTER — Telehealth: Payer: Self-pay | Admitting: Internal Medicine

## 2016-06-26 NOTE — Telephone Encounter (Signed)
Pt called to follow up on what specialist pt will see after having the US done? Or the next process.   Call pt @ [phone]. Thank you!

## 2016-06-26 NOTE — Telephone Encounter (Signed)
Being referred to endocrinology for thyroid nodule with question of need for biopsy.  Dr Gabriel Carina is the endocrinologist.

## 2016-06-26 NOTE — Telephone Encounter (Signed)
Spoke with the patient, and reviewed the plan. thanks

## 2016-06-26 NOTE — Telephone Encounter (Signed)
Please advise, is this the follow up referral to Lucilla Lame? thanks

## 2016-06-26 NOTE — Telephone Encounter (Signed)
Left a VM to return my call, thanks 

## 2016-07-03 ENCOUNTER — Telehealth: Payer: Self-pay | Admitting: *Deleted

## 2016-07-03 NOTE — Telephone Encounter (Signed)
Patient has requested to have a phone consult, he hasn't had a bowel movement in 5 days. Pt contact  [phone]

## 2016-07-03 NOTE — Telephone Encounter (Signed)
Patient has been informed.

## 2016-07-03 NOTE — Telephone Encounter (Signed)
Pt was called and stated that no BM in 5 days. Pt has not started any new medications or antibiotics. He has tried miralax and senna plus. Pt is scheduled on Arnett, NP schedule at 7 a.m. For a thirty minute app on 07/04/16.

## 2016-07-03 NOTE — Telephone Encounter (Signed)
Please call with the following  Constipation plan  1) Take Miralax once to twice per day until you have a bowel movement. You will end up titrating the use of Miralax. For example, you  may find that using the medication every other day or three times a week is a good bowel regimen for you. Or perhaps, twice weekly.   2)  If you do not get results with Miralax alone, you may then add Bisacodyl suppository daily to regimen until you get desired bowel results.  3) Take Colace ( stool softener) twice daily every day.   It is MOST important to drink LOTS of water and follow a HIGH fiber diet to keep foods moving through the gut. You may add Metamucil to a beverage that you drink.  Information on prevention of constipation as well as acute treatment for constipation as included below.  If there is no improvement in your symptoms, or if there is any worsening of symptoms, or if you have any additional concerns, please return to this clinic for re-evaluation; or, if we are closed, consider going to the Emergency Room for evaluation.    Constipation Prevention What is Constipation? Constipation is hard, dry bowel movements or the inability to have a bowel movement.  You can also feel like you need to have a bowel movement but not be able to.  It can also be painful when you strain to have a bowel movement.  Taking narcotic pain medicine after surgery can make you constipated, even if you have never had a problem with constipation. What Do I Need To Do? The best thing to do for constipation is to keep it from happening.  This can be done by: Adding laxatives to your daily routine, when taking prescription pain medicines after surgery. Add 17 gm Miralax daily or 100 mg Colace once or twice daily. (Miralax is mixed in water. Colace is a pill). They soften your bowel movements to make them easier to pass and hurt less. Drink plenty of water to help flush your bowels.  (Eight, 8 ounce glasses daily) Eat  foods high in fiber such as whole grains, vegetables, cereals, fruits, and prune juice (5-7 servings a day or 25 grams).  If you do not know how much fiber a food has in it you can look on the label under dietary fiber.  If you have trouble getting enough fiber in your diet you may want to consider a fiber supplement such as Metamucil or Citrucel.  Also, be aware that eating fiber without drinking enough water can make constipation worse. If you do become constipated some medications that may help are: Bisacodyl (Dulcolax) is available in tablet form or a suppository. Glycerin suppositories are also a good choice if you need a fast acting medication. Everybody is different and may have different results.  Talk to your pharmacist or health care provider about your specific problems. They can help you choose the best product for you.  Why Is It Important for Me To Do This? Being constipated is not something you have to live with.  There are many things you can do to help.   Feeling bad can interfere with your recovery after surgery.  If constipation goes on for too long it can become a very serious medical problem. You may need to visit your doctor or go to the hospital.  That is why it is very important to drink lots of water, eat enough fiber, and keep it from happening.  Ask Questions We want to answer all of your questions and concerns.  Thats why we encourage you to use a program called Ask Me 3, created by the [employer].  By using Ask Me 3 you are encouraged to ask 3 simple (yet, potentially life saving questions) whenever you are talking with your physician, nurse or pharmacist: What is my main problem? What do I need to do? Why is it important for me to do this? By understanding the answer to these three questions and any other questions you may have, you have the knowledge necessary to manage your health. Please feel very comfortable asking any questions.  Healthcare is complicated, so if you hear an answer you do not understand, please ask your health care team to explain again.   Sources: [employer] 07-28-10 N

## 2016-07-04 ENCOUNTER — Encounter: Payer: Self-pay | Admitting: Family

## 2016-07-04 ENCOUNTER — Ambulatory Visit (INDEPENDENT_AMBULATORY_CARE_PROVIDER_SITE_OTHER): Payer: Medicare Other | Admitting: Family

## 2016-07-04 DIAGNOSIS — K5900 Constipation, unspecified: Secondary | ICD-10-CM

## 2016-07-04 NOTE — Progress Notes (Signed)
Subjective:    Patient ID: Jose Perry, male    DOB: [birth year], 75 y.o.   MRN: [id number]  CC: Jose Perry is a 75 y.o. male who presents today for an acute visit.    HPI: Patient here for chief complaint constipation, had a BM this morning. Loose, brown. Non bloody. No fever, abdominal pain. Had been constipated over the last 5 days. Hasn't eaten anything over the last 5 days due to constipation. Excited to eat today. He has been using senna.  Suspects related to thyroid as it had been in the past. He has an appointment with endocrine next month for evaluation of nodules.   Colonoscopy 2015 showed 2 small polyps & internal hemorrhoids. Exam otherwise normal     HISTORY:  Past Medical History:  Diagnosis Date  . Elevated blood pressure   . Enlarged prostate   . GERD (gastroesophageal reflux disease)   . Glaucoma   . Hx of colonic polyps   . Leukopenia    has had an extensive w/up.    . Thyroid goiter    Past Surgical History:  Procedure Laterality Date  . BACK SURGERY  2002   ruptured disc  . EYE SURGERY  2009   relieve pressure  . THYROID SURGERY  1968   goiter   Family History  Problem Relation Age of Onset  . Heart disease Mother   . Alcohol abuse Father   . Hyperlipidemia Father   . Stroke Father     Allergies: Review of patient's allergies indicates no known allergies. Current Outpatient Prescriptions on File Prior to Visit  Medication Sig Dispense Refill  . bimatoprost (LUMIGAN) 0.03 % ophthalmic solution Place 1 drop into the right eye at bedtime.     Marland Kitchen SIMBRINZA 1-0.2 % SUSP 2 drops.    . TAMSULOSIN HCL PO Take 0.4 mg by mouth daily.      No current facility-administered medications on file prior to visit.     Social History  Substance Use Topics  . Smoking status: Never Smoker  . Smokeless tobacco: Never Used  . Alcohol use No    Review of Systems  Constitutional: Negative for chills and fever.  HENT: Negative for congestion, ear pain,  rhinorrhea, sinus pressure and sore throat.   Respiratory: Negative for cough, shortness of breath and wheezing.   Cardiovascular: Negative for chest pain and palpitations.  Gastrointestinal: Positive for constipation. Negative for abdominal distention, abdominal pain, blood in stool, diarrhea, nausea and vomiting.  Genitourinary: Negative for dysuria.  Musculoskeletal: Negative for myalgias.  Skin: Negative for rash.  Neurological: Negative for headaches.  Hematological: Negative for adenopathy.      Objective:    BP 112/78   Pulse (!) 54   Temp 97.4 F (36.3 C) (Oral)   Ht 5' 10.5" (1.791 m)   Wt 152 lb (68.9 kg)   SpO2 100%   BMI 21.50 kg/m    Physical Exam  Constitutional: He appears well-developed and well-nourished.  Cardiovascular: Regular rhythm and normal heart sounds.   Pulmonary/Chest: Effort normal and breath sounds normal. No respiratory distress. He has no wheezes. He has no rhonchi. He has no rales.  Abdominal: Soft. Normal appearance. He exhibits no distension, no fluid wave, no ascites and no mass. Bowel sounds are increased. There is no tenderness. There is no rigidity, no rebound and no guarding.  Neurological: He is alert.  Skin: Skin is warm and dry.  Psychiatric: He has a normal mood and affect.  His speech is normal and behavior is normal.  Vitals reviewed.      Assessment & Plan:   Problem List Items Addressed This Visit      Digestive   Constipation    Resolved with home regimen. Provided reassurance. Afebrile. No abdominal pain. TSH 3 weeks ago normal. Education provided regarding constipation prevention.        Other Visit Diagnoses   None.        I am having Mr. Kintner maintain his bimatoprost, TAMSULOSIN HCL PO, and SIMBRINZA.   No orders of the defined types were placed in this encounter.   Return precautions given.   Risks, benefits, and alternatives of the medications and treatment plan prescribed today were discussed, and  patient expressed understanding.   Education regarding symptom management and diagnosis given to patient on AVS.  Continue to follow with Einar Pheasant, MD for routine health maintenance.   [name] and I agreed with plan.   Mable Paris, FNP

## 2016-07-04 NOTE — Assessment & Plan Note (Addendum)
Resolved with home regimen. Provided reassurance. Afebrile. No abdominal pain. TSH 3 weeks ago normal. Education provided regarding constipation prevention.

## 2016-07-04 NOTE — Progress Notes (Signed)
Pre visit review using our clinic review tool, if applicable. No additional management support is needed unless otherwise documented below in the visit note. 

## 2016-07-04 NOTE — Patient Instructions (Signed)
Glad that you were able to have a bowel movement.   For the future- here is my constipation plan.   Constipation plan  1) Take Miralax once to twice per day until you have a bowel movement. You will end up titrating the use of Miralax. For example, you  may find that using the medication every other day or three times a week is a good bowel regimen for you. Or perhaps, twice weekly.   2)  If you do not get results with Miralax alone, you may then add Bisacodyl suppository daily to regimen until you get desired bowel results.  3) Take Colace ( stool softener) twice daily every day.   It is MOST important to drink LOTS of water and follow a HIGH fiber diet to keep foods moving through the gut. You may add Metamucil to a beverage that you drink.  Information on prevention of constipation as well as acute treatment for constipation as included below.  If there is no improvement in your symptoms, or if there is any worsening of symptoms, or if you have any additional concerns, please return to this clinic for re-evaluation; or, if we are closed, consider going to the Emergency Room for evaluation.    Constipation Prevention What is Constipation? Constipation is hard, dry bowel movements or the inability to have a bowel movement.  You can also feel like you need to have a bowel movement but not be able to.  It can also be painful when you strain to have a bowel movement.  Taking narcotic pain medicine after surgery can make you constipated, even if you have never had a problem with constipation. What Do I Need To Do? The best thing to do for constipation is to keep it from happening.  This can be done by: Adding laxatives to your daily routine, when taking prescription pain medicines after surgery. Add 17 gm Miralax daily or 100 mg Colace once or twice daily. (Miralax is mixed in water. Colace is a pill). They soften your bowel movements to make them easier to pass and hurt less. Drink plenty of  water to help flush your bowels.  (Eight, 8 ounce glasses daily) Eat foods high in fiber such as whole grains, vegetables, cereals, fruits, and prune juice (5-7 servings a day or 25 grams).  If you do not know how much fiber a food has in it you can look on the label under dietary fiber.  If you have trouble getting enough fiber in your diet you may want to consider a fiber supplement such as Metamucil or Citrucel.  Also, be aware that eating fiber without drinking enough water can make constipation worse. If you do become constipated some medications that may help are: Bisacodyl (Dulcolax) is available in tablet form or a suppository. Glycerin suppositories are also a good choice if you need a fast acting medication. Everybody is different and may have different results.  Talk to your pharmacist or health care provider about your specific problems. They can help you choose the best product for you.  Why Is It Important for Me To Do This? Being constipated is not something you have to live with.  There are many things you can do to help.   Feeling bad can interfere with your recovery after surgery.  If constipation goes on for too long it can become a very serious medical problem. You may need to visit your doctor or go to the hospital.  That is why it is  very important to drink lots of water, eat enough fiber, and keep it from happening.  Ask Questions We want to answer all of your questions and concerns.  Thats why we encourage you to use a program called Ask Me 3, created by the [employer].  By using Ask Me 3 you are encouraged to ask 3 simple (yet, potentially life saving questions) whenever you are talking with your physician, nurse or pharmacist: What is my main problem? What do I need to do? Why is it important for me to do this? By understanding the answer to these three questions and any other questions you may have, you have the knowledge necessary to  manage your health. Please feel very comfortable asking any questions. Healthcare is complicated, so if you hear an answer you do not understand, please ask your health care team to explain again.   Sources: [employer] 07-28-10 N

## 2016-07-25 DIAGNOSIS — K5900 Constipation, unspecified: Secondary | ICD-10-CM | POA: Diagnosis not present

## 2016-07-25 DIAGNOSIS — K210 Gastro-esophageal reflux disease with esophagitis: Secondary | ICD-10-CM | POA: Diagnosis not present

## 2016-07-27 DIAGNOSIS — N401 Enlarged prostate with lower urinary tract symptoms: Secondary | ICD-10-CM | POA: Diagnosis not present

## 2016-07-27 DIAGNOSIS — R339 Retention of urine, unspecified: Secondary | ICD-10-CM | POA: Diagnosis not present

## 2016-07-27 DIAGNOSIS — R972 Elevated prostate specific antigen [PSA]: Secondary | ICD-10-CM | POA: Diagnosis not present

## 2016-07-27 DIAGNOSIS — N529 Male erectile dysfunction, unspecified: Secondary | ICD-10-CM | POA: Diagnosis not present

## 2016-08-01 DIAGNOSIS — K5900 Constipation, unspecified: Secondary | ICD-10-CM | POA: Diagnosis not present

## 2016-08-08 DIAGNOSIS — E042 Nontoxic multinodular goiter: Secondary | ICD-10-CM | POA: Diagnosis not present

## 2016-08-18 ENCOUNTER — Ambulatory Visit (INDEPENDENT_AMBULATORY_CARE_PROVIDER_SITE_OTHER): Payer: Medicare Other

## 2016-08-18 VITALS — BP 110/72 | HR 61 | Temp 98.1°F | Resp 12 | Ht 70.0 in | Wt 160.4 lb

## 2016-08-18 DIAGNOSIS — Z0000 Encounter for general adult medical examination without abnormal findings: Secondary | ICD-10-CM

## 2016-08-18 NOTE — Patient Instructions (Addendum)
[name] , Thank you for taking time to come for your Medicare Wellness Visit. I appreciate your ongoing commitment to your health goals. Please review the following plan we discussed and let me know if I can assist you in the future.   FOLLOW UP WITH DR. Nicki Reaper AS NEEDED.  These are the goals we discussed: Goals    . Healthy Lifestyle          Stay hydrated and continue drinking plenty of water. Stay active and maintain exercise regimen. Low carb foods.  Lean meats, vegetables.       This is a list of the screening recommended for you and due dates:  Health Maintenance  Topic Date Due  . Tetanus Vaccine  [birth year]/1961  . Colon Cancer Screening  07/08/2024  . Flu Shot  Completed  . Shingles Vaccine  Completed  . Pneumonia vaccines  Completed   Health Maintenance, Male A healthy lifestyle and preventative care can promote health and wellness.  Maintain regular health, dental, and eye exams.  Eat a healthy diet. Foods like vegetables, fruits, whole grains, low-fat dairy products, and lean protein foods contain the nutrients you need and are low in calories. Decrease your intake of foods high in solid fats, added sugars, and salt. Get information about a proper diet from your health care provider, if necessary.  Regular physical exercise is one of the most important things you can do for your health. Most adults should get at least 150 minutes of moderate-intensity exercise (any activity that increases your heart rate and causes you to sweat) each week. In addition, most adults need muscle-strengthening exercises on 2 or more days a week.   Maintain a healthy weight. The body mass index (BMI) is a screening tool to identify possible weight problems. It provides an estimate of body fat based on height and weight. Your health care provider can find your BMI and can help you achieve or maintain a healthy weight. For males 20 years and older:  A BMI below 18.5 is considered  underweight.  A BMI of 18.5 to 24.9 is normal.  A BMI of 25 to 29.9 is considered overweight.  A BMI of 30 and above is considered obese.  Maintain normal blood lipids and cholesterol by exercising and minimizing your intake of saturated fat. Eat a balanced diet with plenty of fruits and vegetables. Blood tests for lipids and cholesterol should begin at age 44 and be repeated every 5 years. If your lipid or cholesterol levels are high, you are over age 72, or you are at high risk for heart disease, you may need your cholesterol levels checked more frequently.Ongoing high lipid and cholesterol levels should be treated with medicines if diet and exercise are not working.  If you smoke, find out from your health care provider how to quit. If you do not use tobacco, do not start.  Lung cancer screening is recommended for adults aged 9-80 years who are at high risk for developing lung cancer because of a history of smoking. A yearly low-dose CT scan of the lungs is recommended for people who have at least a 30-pack-year history of smoking and are current smokers or have quit within the past 15 years. A pack year of smoking is smoking an average of 1 pack of cigarettes a day for 1 year (for example, a 30-pack-year history of smoking could mean smoking 1 pack a day for 30 years or 2 packs a day for 15 years). Yearly screening  should continue until the smoker has stopped smoking for at least 15 years. Yearly screening should be stopped for people who develop a health problem that would prevent them from having lung cancer treatment.  If you choose to drink alcohol, do not have more than 2 drinks per day. One drink is considered to be 12 oz (360 mL) of beer, 5 oz (150 mL) of wine, or 1.5 oz (45 mL) of liquor.  Avoid the use of street drugs. Do not share needles with anyone. Ask for help if you need support or instructions about stopping the use of drugs.  High blood pressure causes heart disease and  increases the risk of stroke. High blood pressure is more likely to develop in:  People who have blood pressure in the end of the normal range (100-139/85-89 mm Hg).  People who are overweight or obese.  People who are African American.  If you are 59-46 years of age, have your blood pressure checked every 3-5 years. If you are 83 years of age or older, have your blood pressure checked every year. You should have your blood pressure measured twice--once when you are at a hospital or clinic, and once when you are not at a hospital or clinic. Record the average of the two measurements. To check your blood pressure when you are not at a hospital or clinic, you can use:  An automated blood pressure machine at a pharmacy.  A home blood pressure monitor.  If you are 35-66 years old, ask your health care provider if you should take aspirin to prevent heart disease.  Diabetes screening involves taking a blood sample to check your fasting blood sugar level. This should be done once every 3 years after age 22 if you are at a normal weight and without risk factors for diabetes. Testing should be considered at a younger age or be carried out more frequently if you are overweight and have at least 1 risk factor for diabetes.  Colorectal cancer can be detected and often prevented. Most routine colorectal cancer screening begins at the age of 74 and continues through age 51. However, your health care provider may recommend screening at an earlier age if you have risk factors for colon cancer. On a yearly basis, your health care provider may provide home test kits to check for hidden blood in the stool. A small camera at the end of a tube may be used to directly examine the colon (sigmoidoscopy or colonoscopy) to detect the earliest forms of colorectal cancer. Talk to your health care provider about this at age 86 when routine screening begins. A direct exam of the colon should be repeated every 5-10 years through  age 20, unless early forms of precancerous polyps or small growths are found.  People who are at an increased risk for hepatitis B should be screened for this virus. You are considered at high risk for hepatitis B if:  You were born in a country where hepatitis B occurs often. Talk with your health care provider about which countries are considered high risk.  Your parents were born in a high-risk country and you have not received a shot to protect against hepatitis B (hepatitis B vaccine).  You have HIV or AIDS.  You use needles to inject street drugs.  You live with, or have sex with, someone who has hepatitis B.  You are a man who has sex with other men (MSM).  You get hemodialysis treatment.  You take certain  medicines for conditions like cancer, organ transplantation, and autoimmune conditions.  Hepatitis C blood testing is recommended for all people born from [birth year] through 1964 and any individual with known risk factors for hepatitis C.  Healthy men should no longer receive prostate-specific antigen (PSA) blood tests as part of routine cancer screening. Talk to your health care provider about prostate cancer screening.  Testicular cancer screening is not recommended for adolescents or adult males who have no symptoms. Screening includes self-exam, a health care provider exam, and other screening tests. Consult with your health care provider about any symptoms you have or any concerns you have about testicular cancer.  Practice safe sex. Use condoms and avoid high-risk sexual practices to reduce the spread of sexually transmitted infections (STIs).  You should be screened for STIs, including gonorrhea and chlamydia if:  You are sexually active and are younger than 24 years.  You are older than 24 years, and your health care provider tells you that you are at risk for this type of infection.  Your sexual activity has changed since you were last screened, and you are at an  increased risk for chlamydia or gonorrhea. Ask your health care provider if you are at risk.  If you are at risk of being infected with HIV, it is recommended that you take a prescription medicine daily to prevent HIV infection. This is called pre-exposure prophylaxis (PrEP). You are considered at risk if:  You are a man who has sex with other men (MSM).  You are a heterosexual man who is sexually active with multiple partners.  You take drugs by injection.  You are sexually active with a partner who has HIV.  Talk with your health care provider about whether you are at high risk of being infected with HIV. If you choose to begin PrEP, you should first be tested for HIV. You should then be tested every 3 months for as long as you are taking PrEP.  Use sunscreen. Apply sunscreen liberally and repeatedly throughout the day. You should seek shade when your shadow is shorter than you. Protect yourself by wearing long sleeves, pants, a wide-brimmed hat, and sunglasses year round whenever you are outdoors.  Tell your health care provider of new moles or changes in moles, especially if there is a change in shape or color. Also, tell your health care provider if a mole is larger than the size of a pencil eraser.  A one-time screening for abdominal aortic aneurysm (AAA) and surgical repair of large AAAs by ultrasound is recommended for men aged 34-75 years who are current or former smokers.  Stay current with your vaccines (immunizations).   This information is not intended to replace advice given to you by your health care provider. Make sure you discuss any questions you have with your health care provider.   Document Released: 03/23/2008 Document Revised: 10/16/2014 Document Reviewed: 02/20/2011 Elsevier Interactive Patient Education [employer].

## 2016-08-18 NOTE — Progress Notes (Signed)
Subjective:   Jose Perry is a 75 y.o. male who presents for an Initial Medicare Annual Wellness Visit.  Review of Systems  No ROS.  Medicare Wellness Visit.  Cardiac Risk Factors include: advanced age (>34men, >36 women);male gender    Objective:    Today's Vitals   08/18/16 1640  BP: 110/72  Pulse: 61  Resp: 12  Temp: 98.1 F (36.7 C)  TempSrc: Oral  SpO2: 98%  Weight: 160 lb 6.4 oz (72.8 kg)  Height: 5\' 10"  (1.778 m)   Body mass index is 23.02 kg/m.  Current Medications (verified) Outpatient Encounter Prescriptions as of 08/18/2016  Medication Sig  . bimatoprost (LUMIGAN) 0.03 % ophthalmic solution Place 1 drop into the right eye at bedtime.   Marland Kitchen SIMBRINZA 1-0.2 % SUSP 2 drops.  . TAMSULOSIN HCL PO Take 0.4 mg by mouth daily.    No facility-administered encounter medications on file as of 08/18/2016.     Allergies (verified) Patient has no known allergies.   History: Past Medical History:  Diagnosis Date  . Elevated blood pressure   . Enlarged prostate   . GERD (gastroesophageal reflux disease)   . Glaucoma   . Hx of colonic polyps   . Leukopenia    has had an extensive w/up.    . Thyroid goiter    Past Surgical History:  Procedure Laterality Date  . BACK SURGERY  2002   ruptured disc  . EYE SURGERY  2009   relieve pressure  . THYROID SURGERY  1968   goiter   Family History  Problem Relation Age of Onset  . Heart disease Mother   . Alcohol abuse Father   . Hyperlipidemia Father   . Stroke Father    Social History   Occupational History  . Not on file.   Social History Main Topics  . Smoking status: Never Smoker  . Smokeless tobacco: Never Used  . Alcohol use No  . Drug use: No  . Sexual activity: Not on file   Tobacco Counseling Counseling given: Not Answered   Activities of Daily Living In your present state of health, do you have any difficulty performing the following activities: 08/18/2016  Hearing? N  Vision? N    Difficulty concentrating or making decisions? N  Walking or climbing stairs? N  Dressing or bathing? N  Doing errands, shopping? N  Preparing Food and eating ? N  Using the Toilet? N  In the past six months, have you accidently leaked urine? Y  Do you have problems with loss of bowel control? N  Managing your Medications? N  Managing your Finances? N  Housekeeping or managing your Housekeeping? N  Some recent data might be hidden    Immunizations and Health Maintenance Immunization History  Administered Date(s) Administered  . Influenza, High Dose Seasonal PF 06/13/2016  . Influenza,inj,Quad PF,36+ Mos 07/20/2015  . Pneumococcal Conjugate-13 06/10/2015  . Pneumococcal Polysaccharide-23 06/30/2013  . Zoster 06/30/2013   Health Maintenance Due  Topic Date Due  . Samul Dada  01/25/1960    Patient Care Team: Einar Pheasant, MD as PCP - General (Internal Medicine)  Indicate any recent Medical Services you may have received from other than [facility] providers in the past year (date may be approximate).    Assessment:   This is a routine wellness examination for Jose Perry. The goal of the wellness visit is to assist the patient how to close the gaps in care and create a preventative care plan for the patient.  Taking calcium VIT D as appropriate/Osteoporosis risk reviewed.  Medications reviewed; taking without issues or barriers.  Safety issues reviewed; lives with wife.  Alarm with smoke and carbon monoxide detectors in the home. Firearms locked in a safe within the home. Wears seatbelts when driving or riding with others. No violence in the home.  No identified risk were noted; The patient was oriented x 3; appropriate in dress and manner and no objective failures at ADL's or IADL's.   Body mass index; discussed the importance of a healthy diet, water intake and exercise. Educational material provided.  TDAP vaccine deferred for follow up with insurance.  Educational  material provided.  Patient Concerns: None at this time. Follow up with PCP as needed.  Hearing/Vision screen Hearing Screening Comments: Passes the whisper test Vision Screening Comments: Followed by [facility] (Dr. Mordecai Rasmussen) Wears glasses Glaucoma  Dietary issues and exercise activities discussed: Current Exercise Habits: Home exercise routine, Type of exercise: calisthenics;strength training/weights, Frequency (Times/Week): 7, Intensity: Moderate  Goals    . Healthy Lifestyle          Stay hydrated and continue drinking plenty of water. Stay active and maintain exercise regimen. Low carb foods.  Lean meats, vegetables.      Depression Screen PHQ 2/9 Scores 08/18/2016 07/04/2016 06/13/2016 12/09/2015  PHQ - 2 Score 0 0 0 0    Fall Risk Fall Risk  08/18/2016 07/04/2016 06/13/2016 12/09/2015 08/30/2015  Falls in the past year? No No No No No    Cognitive Function: MMSE - Mini Mental State Exam 08/18/2016  Orientation to time 5  Orientation to Place 5  Registration 3  Attention/ Calculation 5  Recall 3  Language- name 2 objects 2  Language- repeat 1  Language- follow 3 step command 3  Language- read & follow direction 1  Write a sentence 1  Copy design 1  Total score 30     6CIT Screen 08/18/2016  What Year? 0 points  What month? 0 points  What time? 0 points  Count back from 20 0 points  Months in reverse 0 points    Screening Tests Health Maintenance  Topic Date Due  . TETANUS/TDAP  01/25/1961  . COLONOSCOPY  07/08/2024  . INFLUENZA VACCINE  Completed  . ZOSTAVAX  Completed  . PNA vac Low Risk Adult  Completed        Plan:    End of life planning; Advance aging; Advanced directives discussed. No HCPOA/Living Will.  Additional information given to help him start the conversation with his family. Follow up with PCP as needed.  Medicare Attestation I have personally reviewed: The patient's medical and social history Their use of alcohol,  tobacco or illicit drugs Their current medications and supplements The patient's functional ability including ADLs,fall risks, home safety risks, cognitive, and hearing and visual impairment Diet and physical activities Evidence for depression   The patient's weight, height, BMI, and visual acuity have been recorded in the chart.  I have made referrals and provided education to the patient based on review of the above and I have provided the patient with a written personalized care plan for preventive services.    During the course of the visit Armahn was educated and counseled about the following appropriate screening and preventive services:   Vaccines to include Pneumoccal, Influenza, Hepatitis B, Td, Zostavax, HCV  Electrocardiogram  Colorectal cancer screening  Cardiovascular disease screening  Diabetes screening  Glaucoma screening  Nutrition counseling  Prostate cancer screening  Smoking cessation counseling  Patient Instructions (the written plan) were given to the patient.   Varney Biles, LPN   [id number]

## 2016-08-20 NOTE — Progress Notes (Signed)
Care was provided under my supervision. I agree with the management as indicated in the note.  Haruka Kowaleski DO  

## 2016-08-21 DIAGNOSIS — H1013 Acute atopic conjunctivitis, bilateral: Secondary | ICD-10-CM | POA: Diagnosis not present

## 2016-09-14 DIAGNOSIS — E042 Nontoxic multinodular goiter: Secondary | ICD-10-CM | POA: Diagnosis not present

## 2016-10-16 DIAGNOSIS — H401111 Primary open-angle glaucoma, right eye, mild stage: Secondary | ICD-10-CM | POA: Diagnosis not present

## 2016-11-01 DIAGNOSIS — R310 Gross hematuria: Secondary | ICD-10-CM | POA: Diagnosis not present

## 2016-11-17 DIAGNOSIS — K121 Other forms of stomatitis: Secondary | ICD-10-CM | POA: Diagnosis not present

## 2016-11-17 DIAGNOSIS — H6123 Impacted cerumen, bilateral: Secondary | ICD-10-CM | POA: Diagnosis not present

## 2016-11-21 DIAGNOSIS — H401123 Primary open-angle glaucoma, left eye, severe stage: Secondary | ICD-10-CM | POA: Diagnosis not present

## 2016-11-25 IMAGING — CR DG LUMBAR SPINE 2-3V
1 series · 3 of 3 positions shown · non-contrast
Comparison: None.

CLINICAL DATA: Low back pain radiating down the right leg.

EXAM:
LUMBAR SPINE - 2-3 VIEW

[Series 1: dg lumbar spine 2-3 views · 0.14mm/px · 3 of 3 slices shown]
[im 1/3]
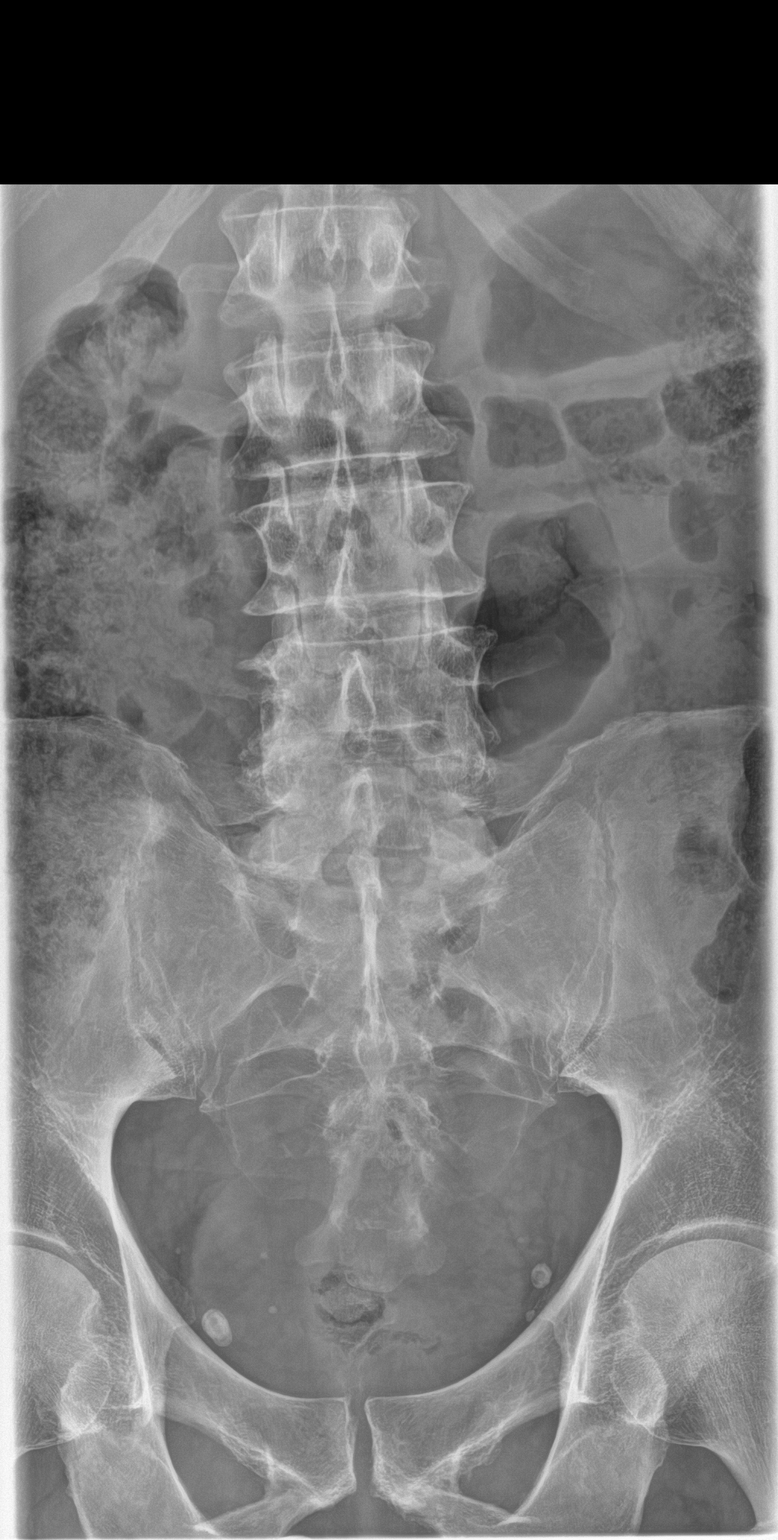
[im 2/3]
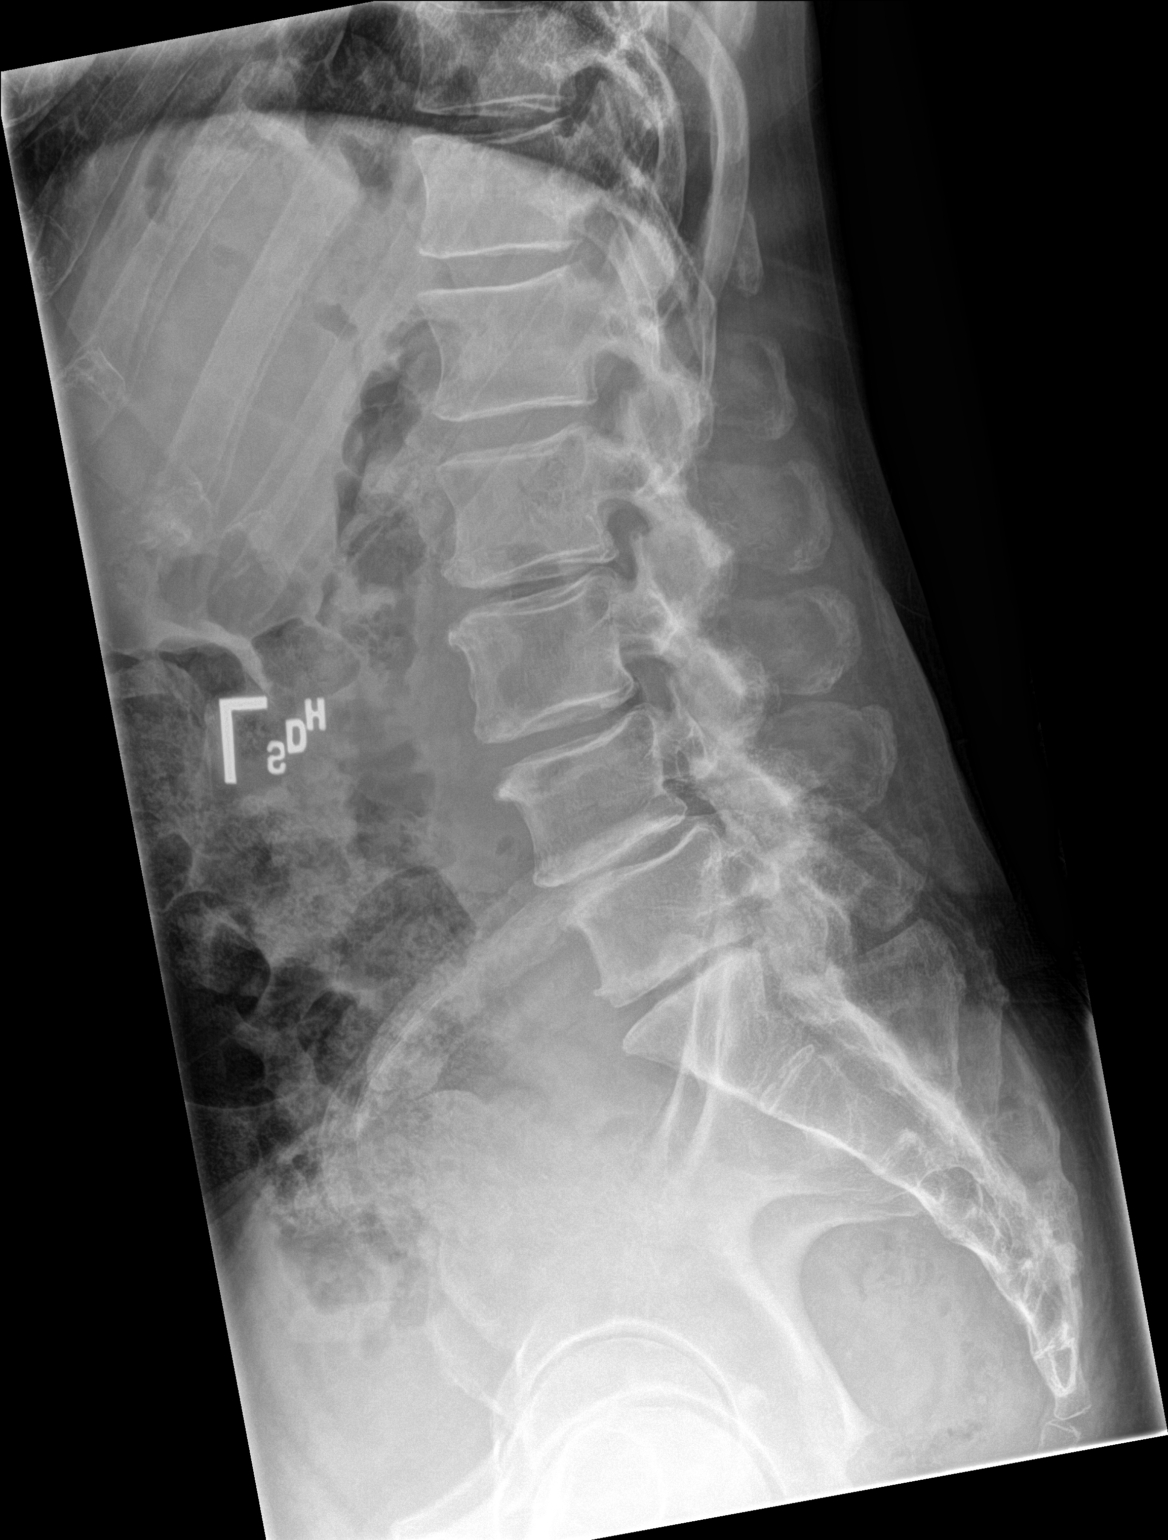
[im 3/3]
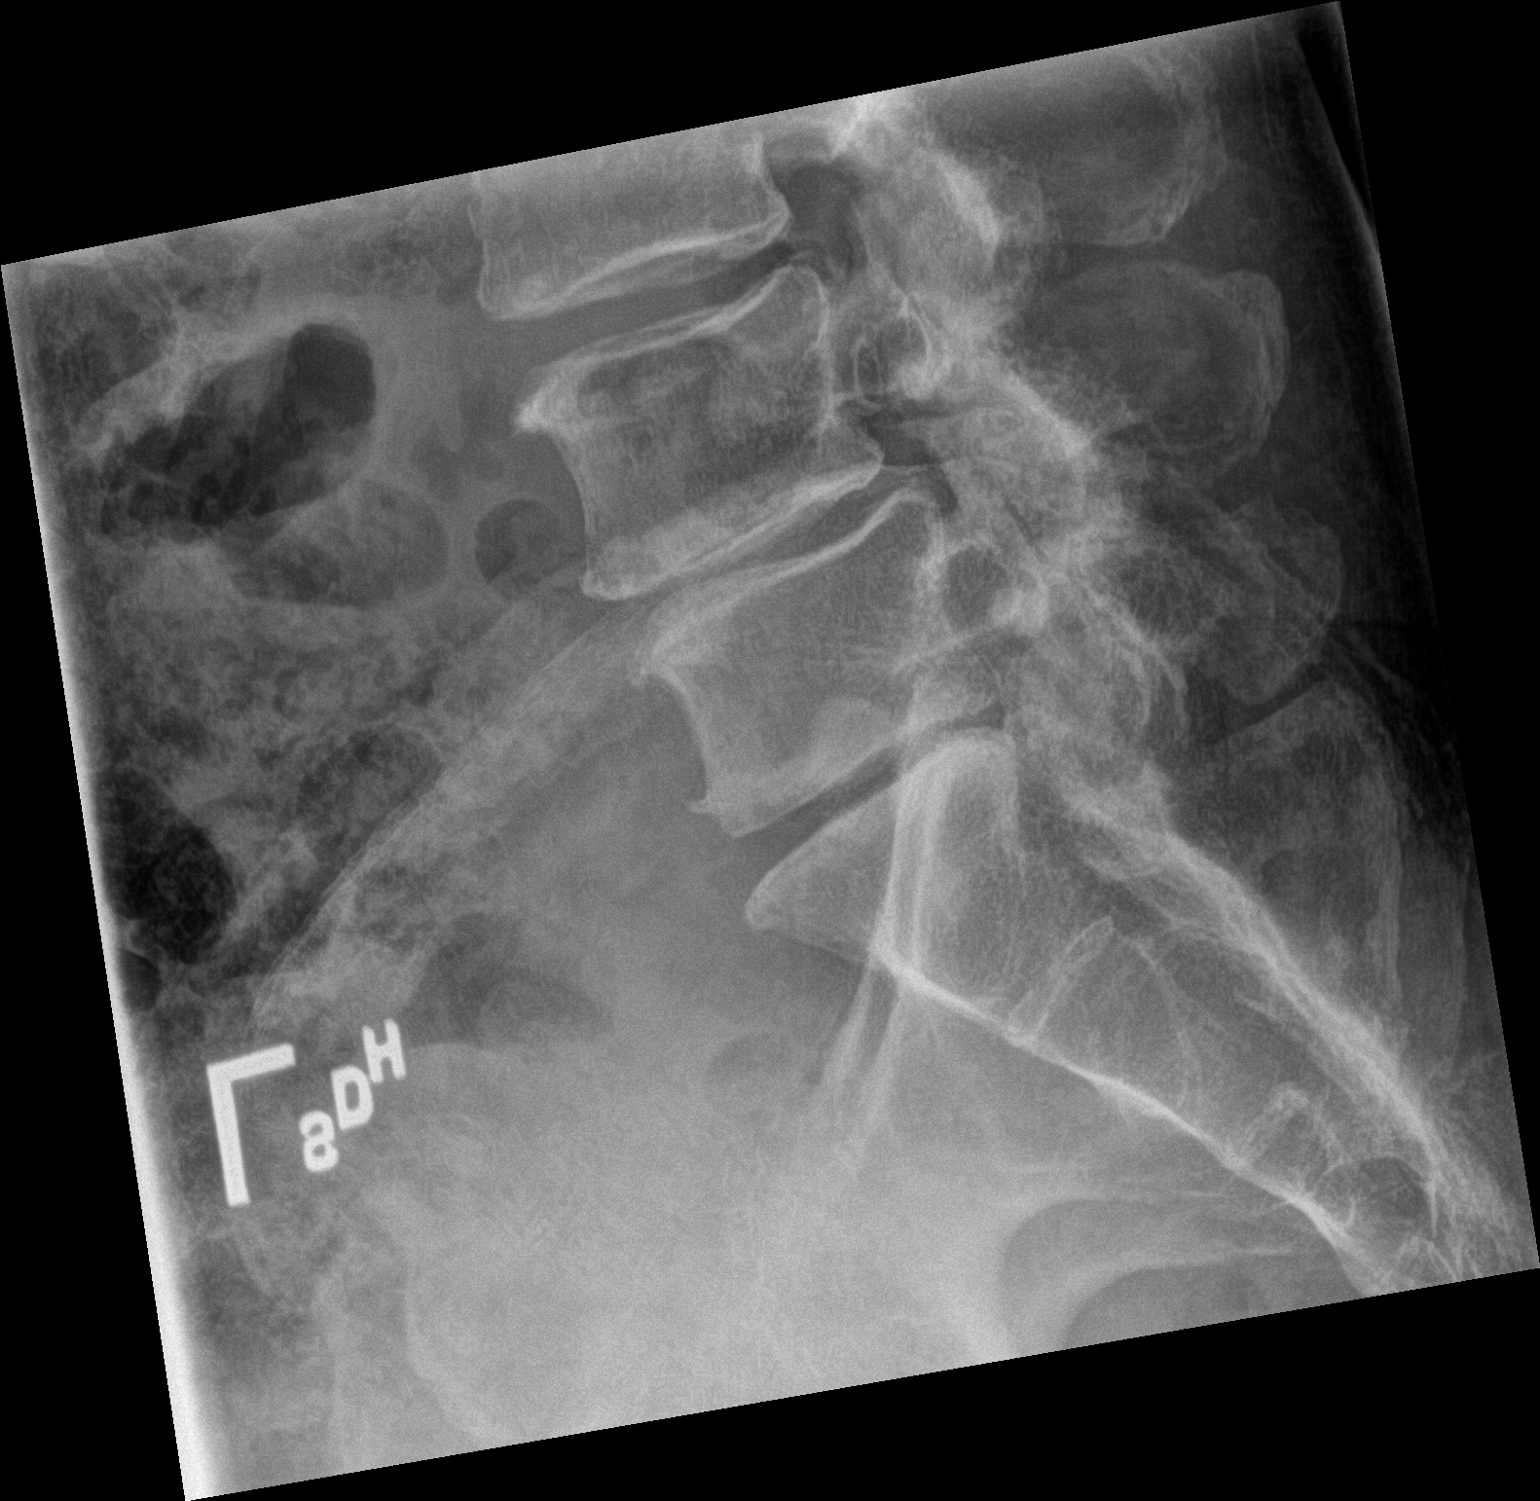

[3 of 3 positions shown; findings below may reference images not displayed]

FINDINGS: There is no evidence of lumbar spine fracture. There is
straightening of the cervical lordosis with 1-2 mm retropulsion of
L5 on L4. Multilevel osteoarthritic changes worse in the lower
lumbosacral spine are noted with disc space narrowing, endplate
sclerosis and cyst formation, and small osteophyte formation. There
worse affected levels are L4-L5 and L5-S1, where there is an
associated advanced posterior facet arthropathy.
IMPRESSION: No evidence of fracture of the lumbosacral spine.

Moderate to severe osteoarthritic changes, worse in the lower
lumbosacral spine.

Likely degenerative 1-2 mm retropulsion of L5 on L4.

## 2016-12-11 ENCOUNTER — Encounter: Payer: Self-pay | Admitting: Internal Medicine

## 2016-12-11 ENCOUNTER — Ambulatory Visit (INDEPENDENT_AMBULATORY_CARE_PROVIDER_SITE_OTHER): Payer: Medicare Other | Admitting: Internal Medicine

## 2016-12-11 VITALS — BP 118/72 | HR 59 | Temp 98.6°F | Resp 16 | Ht 69.5 in | Wt 158.8 lb

## 2016-12-11 DIAGNOSIS — R030 Elevated blood-pressure reading, without diagnosis of hypertension: Secondary | ICD-10-CM

## 2016-12-11 DIAGNOSIS — Z13220 Encounter for screening for lipoid disorders: Secondary | ICD-10-CM

## 2016-12-11 DIAGNOSIS — Z0000 Encounter for general adult medical examination without abnormal findings: Secondary | ICD-10-CM | POA: Diagnosis not present

## 2016-12-11 DIAGNOSIS — E7800 Pure hypercholesterolemia, unspecified: Secondary | ICD-10-CM

## 2016-12-11 DIAGNOSIS — K5900 Constipation, unspecified: Secondary | ICD-10-CM | POA: Diagnosis not present

## 2016-12-11 DIAGNOSIS — D72819 Decreased white blood cell count, unspecified: Secondary | ICD-10-CM

## 2016-12-11 DIAGNOSIS — E041 Nontoxic single thyroid nodule: Secondary | ICD-10-CM | POA: Diagnosis not present

## 2016-12-11 NOTE — Assessment & Plan Note (Signed)
Evaluated by endocrinology.  S/p biopsy.  Biopsy ok per report.  Recommended f/u in one year.

## 2016-12-11 NOTE — Progress Notes (Signed)
Pre-visit discussion using our clinic review tool. No additional management support is needed unless otherwise documented below in the visit note.  

## 2016-12-11 NOTE — Progress Notes (Signed)
Patient ID: Jose Perry, male   DOB: [birth year], 76 y.o.   MRN: [id number]   Subjective:    Patient ID: Jose Perry, male    DOB: 05-14-1941, 76 y.o.   MRN: [id number]  HPI  Patient with past history of elevated blood pressure, GERD and leukopenia.  He comes in today to follow up on these issues as well as for a complete physical exam.  He is doing well.  Feels good.  Still exercising.  No chest pain.  No sob.  No acid reflux.  No abdominal pain or cramping.  Bowels stable.  Was having constipation.  Better now.  Back to normal.  Saw GI.  Had heme positive stool cards.  Dr Tiffany Kocher reviewed.   Did not feel colonoscopy warranted.  Saw endocrinology.  S/p thyroid biopsy.  Ok.  Recommended f/u in one year.     Past Medical History:  Diagnosis Date  . Elevated blood pressure   . Enlarged prostate   . GERD (gastroesophageal reflux disease)   . Glaucoma   . Hx of colonic polyps   . Leukopenia    has had an extensive w/up.    . Thyroid goiter    Past Surgical History:  Procedure Laterality Date  . BACK SURGERY  2002   ruptured disc  . EYE SURGERY  2009   relieve pressure  . THYROID SURGERY  1968   goiter   Family History  Problem Relation Age of Onset  . Heart disease Mother   . Alcohol abuse Father   . Hyperlipidemia Father   . Stroke Father    Social History   Social History  . Marital status: Married    Spouse name: N/A  . Number of children: N/A  . Years of education: N/A   Social History Main Topics  . Smoking status: Never Smoker  . Smokeless tobacco: Never Used  . Alcohol use No  . Drug use: No  . Sexual activity: Not Asked   Other Topics Concern  . None   Social History Narrative  . None    Outpatient Encounter Prescriptions as of 12/11/2016  Medication Sig  . bimatoprost (LUMIGAN) 0.03 % ophthalmic solution Place 1 drop into the right eye at bedtime.   Marland Kitchen latanoprost (XALATAN) 0.005 % ophthalmic solution Apply 2 drops to eye daily. To right eye only  .  SIMBRINZA 1-0.2 % SUSP 2 drops.  . TAMSULOSIN HCL PO Take 0.4 mg by mouth daily.    No facility-administered encounter medications on file as of 12/11/2016.     Review of Systems  Constitutional: Negative for appetite change and unexpected weight change.  HENT: Negative for congestion and sinus pressure.        Minimal drainage.    Eyes: Negative for pain and visual disturbance.  Respiratory: Negative for cough, chest tightness and shortness of breath.   Cardiovascular: Negative for chest pain, palpitations and leg swelling.  Gastrointestinal: Negative for abdominal pain, diarrhea, nausea and vomiting.  Genitourinary: Negative for difficulty urinating and dysuria.  Musculoskeletal: Negative for back pain and joint swelling.  Skin: Negative for color change and rash.  Neurological: Negative for dizziness, light-headedness and headaches.  Hematological: Negative for adenopathy. Does not bruise/bleed easily.  Psychiatric/Behavioral: Negative for agitation and dysphoric mood.       Objective:    Physical Exam  Constitutional: He is oriented to person, place, and time. He appears well-developed and well-nourished. No distress.  HENT:  Head: Normocephalic and atraumatic.  Nose: Nose normal.  Mouth/Throat: Oropharynx is clear and moist. No oropharyngeal exudate.  Eyes: Conjunctivae are normal. Right eye exhibits no discharge. Left eye exhibits no discharge.  Neck: Neck supple. No thyromegaly present.  Cardiovascular: Normal rate and regular rhythm.   Pulmonary/Chest: Breath sounds normal. No respiratory distress. He has no wheezes.  Abdominal: Soft. Bowel sounds are normal. There is no tenderness.  Genitourinary:  Genitourinary Comments: Per urology.   Musculoskeletal: He exhibits no edema or tenderness.  Lymphadenopathy:    He has no cervical adenopathy.  Neurological: He is alert and oriented to person, place, and time.  Skin: Skin is warm and dry. No rash noted. No erythema.    Psychiatric: He has a normal mood and affect. His behavior is normal.    BP 118/72 (BP Location: Left Arm, Patient Position: Sitting, Cuff Size: Large)   Pulse (!) 59   Temp 98.6 F (37 C) (Oral)   Resp 16   Ht 5' 9.5" (1.765 m)   Wt 158 lb 12.8 oz (72 kg)   SpO2 98%   BMI 23.11 kg/m  Wt Readings from Last 3 Encounters:  12/11/16 158 lb 12.8 oz (72 kg)  08/18/16 160 lb 6.4 oz (72.8 kg)  07/04/16 152 lb (68.9 kg)     Lab Results  Component Value Date   WBC 3.1 (L) 06/13/2016   HGB 13.1 06/13/2016   HCT 39.7 06/13/2016   PLT 132.0 (L) 06/13/2016   GLUCOSE 84 11/09/2015   CHOL 185 11/09/2015   TRIG 43.0 11/09/2015   HDL 81.60 11/09/2015   LDLCALC 95 11/09/2015   ALT 20 11/09/2015   AST 35 11/09/2015   NA 138 11/09/2015   K 4.2 11/09/2015   CL 103 11/09/2015   CREATININE 1.44 11/09/2015   BUN 23 11/09/2015   CO2 28 11/09/2015   TSH 1.13 06/13/2016   PSA 2.75 08/11/2014    US Soft Tissue Head/neck  Result Date: 06/21/2016 CLINICAL DATA:  76 year old male with a history of prior right thyroidectomy and a thyroid nodule seen on prior CT scan. EXAM: THYROID ULTRASOUND TECHNIQUE: Ultrasound examination of the thyroid gland and adjacent soft tissues was performed. COMPARISON:  Prior CT scan of the chest 08/30/2015 FINDINGS: Parenchymal Echotexture: Markedly heterogenous Estimated total number of nodules >/= 1 cm: 2 Number of spongiform nodules > 2 cm not described below (TR1): 0 Number of mixed cystic and solid nodules > 1.5 cm not described below (Pierson): 0 _________________________________________________________ Isthmus: 1.0 cm Isoechoic mixed cystic and solid nodule in the thyroid isthmus does not meet consensus criteria for either biopsy or dedicated follow-up. _________________________________________________________ Right lobe: Surgically absent. _________________________________________________________ Left lobe: 5.0 x 2.8 cm Nodule # 1: Location: Left; Mid Size: 3.7 x 2.9  x 2.5 cm Composition: solid/almost completely solid (2) Echogenicity: hyperechoic (1) Shape: not taller-than-wide (0) Margins: smooth (0) Echogenic foci: none (0) ACR TI-RADS total points: 3. ACR TI-RADS risk category: TR3 (3 points). ACR TI-RADS recommendations: **Given size (>2.5 cm) and appearance, fine needle aspiration of this mildly suspicious nodule should be considered based on TI-RADS criteria. Nodule # 2: Location: Left; Inferior Size: 1.7 x 1.1 x 1.2 cm Composition: cannot determine (2) Echogenicity: very hypoechoic (3) Shape: not taller-than-wide (0) Margins: ill-defined (0) Echogenic foci: none (0) ACR TI-RADS total points: 5. ACR TI-RADS risk category: TR4 (4-6 points). ACR TI-RADS recommendations: **Given size (>1.5 cm) and appearance, fine needle aspiration of this moderately suspicious nodule should be considered based on TI-RADS criteria. IMPRESSION: 1. Left mid -  inferior Nodule #1: ACR TI-RADS TR 3. Recommend: Given size (>2.5 cm) and appearance, fine needle aspiration of this mildly suspicious nodule should be considered based on TI-RADS criteria. 2. Left inferior Nodule #2: ACR TI-RADS TR4. Recommend: Given size (>1.5 cm) and appearance, fine needle aspiration of this moderately suspicious nodule should be considered based on TI-RADS criteria. 3. Surgical changes of prior right thyroidectomy. The above is in keeping with the ACR TI-RADS recommendations - J Am Coll Radiol 2017;14:587-595. Electronically Signed   By: Jacqulynn Cadet [name]   On: 06/21/2016 12:52       Assessment & Plan:   Problem List Items Addressed This Visit    Constipation    Saw GI.  Better.       Relevant Orders   Hepatic function panel   Elevated blood pressure reading    Blood pressure looks good now.  Follow.       Relevant Orders   Basic metabolic panel   Health care maintenance    Physical today 12/11/16.  PSA and prostate checks - Dr Jacqlyn Larsen.  Colonoscopy 06/2014.  Recommended f/u in 2020.  Was seen  recent for heme positive.  GI evaluated.  Felt to be hemorrhoids.  Recommended continue f/u in 2020.        Leukopenia    Has been worked up by hematology.  Follow cbc.       Relevant Orders   CBC with Differential/Platelet   Thyroid nodule    Evaluated by endocrinology.  S/p biopsy.  Biopsy ok per report.  Recommended f/u in one year.        Relevant Orders   TSH    Other Visit Diagnoses    Screening cholesterol level    -  Primary   Hypercholesterolemia       Relevant Orders   Lipid panel       Einar Pheasant, MD

## 2016-12-11 NOTE — Assessment & Plan Note (Signed)
Physical today 12/11/16.  PSA and prostate checks - Dr Jacqlyn Larsen.  Colonoscopy 06/2014.  Recommended f/u in 2020.  Was seen recent for heme positive.  GI evaluated.  Felt to be hemorrhoids.  Recommended continue f/u in 2020.

## 2016-12-15 DIAGNOSIS — K121 Other forms of stomatitis: Secondary | ICD-10-CM | POA: Diagnosis not present

## 2016-12-17 ENCOUNTER — Encounter: Payer: Self-pay | Admitting: Internal Medicine

## 2016-12-17 NOTE — Assessment & Plan Note (Signed)
Saw GI.  Better.

## 2016-12-17 NOTE — Assessment & Plan Note (Signed)
Blood pressure looks good now.  Follow.

## 2016-12-17 NOTE — Assessment & Plan Note (Signed)
Has been worked up by hematology.  Follow cbc.  

## 2016-12-27 ENCOUNTER — Other Ambulatory Visit (INDEPENDENT_AMBULATORY_CARE_PROVIDER_SITE_OTHER): Payer: Medicare Other

## 2016-12-27 DIAGNOSIS — E7800 Pure hypercholesterolemia, unspecified: Secondary | ICD-10-CM

## 2016-12-27 DIAGNOSIS — D72819 Decreased white blood cell count, unspecified: Secondary | ICD-10-CM

## 2016-12-27 DIAGNOSIS — K5900 Constipation, unspecified: Secondary | ICD-10-CM | POA: Diagnosis not present

## 2016-12-27 DIAGNOSIS — R030 Elevated blood-pressure reading, without diagnosis of hypertension: Secondary | ICD-10-CM | POA: Diagnosis not present

## 2016-12-27 DIAGNOSIS — E041 Nontoxic single thyroid nodule: Secondary | ICD-10-CM

## 2016-12-27 LAB — HEPATIC FUNCTION PANEL
ALK PHOS: 64 U/L (ref 39–117)
ALT: 14 U/L (ref 0–53)
AST: 28 U/L (ref 0–37)
Albumin: 4.5 g/dL (ref 3.5–5.2)
BILIRUBIN TOTAL: 0.6 mg/dL (ref 0.2–1.2)
Bilirubin, Direct: 0.1 mg/dL (ref 0.0–0.3)
TOTAL PROTEIN: 7 g/dL (ref 6.0–8.3)

## 2016-12-27 LAB — BASIC METABOLIC PANEL
BUN: 19 mg/dL (ref 6–23)
CALCIUM: 10.6 mg/dL — AB (ref 8.4–10.5)
CO2: 28 mEq/L (ref 19–32)
CREATININE: 1.38 mg/dL (ref 0.40–1.50)
Chloride: 105 mEq/L (ref 96–112)
GFR: 64.61 mL/min (ref >60.00)
GLUCOSE: 82 mg/dL (ref 70–99)
POTASSIUM: 3.9 meq/L (ref 3.5–5.1)
Sodium: 139 mEq/L (ref 135–145)

## 2016-12-27 LAB — CBC WITH DIFFERENTIAL/PLATELET
BASOS ABS: 0 10*3/uL (ref 0.0–0.1)
Basophils Relative: 1.1 % (ref 0.0–3.0)
EOS ABS: 0.1 10*3/uL (ref 0.0–0.7)
Eosinophils Relative: 3 % (ref 0.0–5.0)
HEMATOCRIT: 43.8 % (ref 39.0–52.0)
Hemoglobin: 14.3 g/dL (ref 13.0–17.0)
LYMPHS PCT: 36.3 % (ref 12.0–46.0)
Lymphs Abs: 1.1 10*3/uL (ref 0.7–4.0)
MCHC: 32.8 g/dL (ref 30.0–36.0)
MCV: 82.4 fl (ref 78.0–100.0)
Monocytes Absolute: 0.3 10*3/uL (ref 0.1–1.0)
Monocytes Relative: 10 % (ref 3.0–12.0)
NEUTROS ABS: 1.5 10*3/uL (ref 1.4–7.7)
Neutrophils Relative %: 49.6 % (ref 43.0–77.0)
Platelets: 119 10*3/uL — ABNORMAL LOW (ref 150.0–400.0)
RBC: 5.31 Mil/uL (ref 4.22–5.81)
RDW: 14.3 % (ref 11.5–15.5)
WBC: 3.1 10*3/uL — AB (ref 4.0–10.5)

## 2016-12-27 LAB — LIPID PANEL
CHOLESTEROL: 180 mg/dL (ref 0–200)
HDL: 74.6 mg/dL (ref >39.00)
LDL Cholesterol: 95 mg/dL (ref 0–99)
NonHDL: 105.55
TRIGLYCERIDES: 52 mg/dL (ref 0.0–149.0)
Total CHOL/HDL Ratio: 2
VLDL: 10.4 mg/dL (ref 0.0–40.0)

## 2016-12-27 LAB — TSH: TSH: 2.09 u[IU]/mL (ref 0.35–4.50)

## 2016-12-28 ENCOUNTER — Other Ambulatory Visit: Payer: Self-pay | Admitting: Internal Medicine

## 2016-12-28 DIAGNOSIS — D696 Thrombocytopenia, unspecified: Secondary | ICD-10-CM

## 2016-12-28 DIAGNOSIS — D72819 Decreased white blood cell count, unspecified: Secondary | ICD-10-CM

## 2016-12-28 NOTE — Progress Notes (Signed)
Order placed for f/u labs.  

## 2017-01-12 ENCOUNTER — Other Ambulatory Visit (INDEPENDENT_AMBULATORY_CARE_PROVIDER_SITE_OTHER): Payer: Medicare Other

## 2017-01-12 DIAGNOSIS — D72819 Decreased white blood cell count, unspecified: Secondary | ICD-10-CM

## 2017-01-12 DIAGNOSIS — D696 Thrombocytopenia, unspecified: Secondary | ICD-10-CM

## 2017-01-12 LAB — CBC WITH DIFFERENTIAL/PLATELET
BASOS ABS: 0 10*3/uL (ref 0.0–0.1)
BASOS PCT: 0.5 % (ref 0.0–3.0)
EOS ABS: 0.1 10*3/uL (ref 0.0–0.7)
Eosinophils Relative: 2.5 % (ref 0.0–5.0)
HEMATOCRIT: 41.6 % (ref 39.0–52.0)
HEMOGLOBIN: 13.4 g/dL (ref 13.0–17.0)
LYMPHS PCT: 37.3 % (ref 12.0–46.0)
Lymphs Abs: 1.3 10*3/uL (ref 0.7–4.0)
MCHC: 32.2 g/dL (ref 30.0–36.0)
MCV: 82.7 fl (ref 78.0–100.0)
Monocytes Absolute: 0.3 10*3/uL (ref 0.1–1.0)
Monocytes Relative: 9.6 % (ref 3.0–12.0)
Neutro Abs: 1.8 10*3/uL (ref 1.4–7.7)
Neutrophils Relative %: 50.1 % (ref 43.0–77.0)
Platelets: 117 10*3/uL — ABNORMAL LOW (ref 150.0–400.0)
RBC: 5.03 Mil/uL (ref 4.22–5.81)
RDW: 15.2 % (ref 11.5–15.5)
WBC: 3.6 10*3/uL — AB (ref 4.0–10.5)

## 2017-01-12 LAB — CALCIUM: Calcium: 10 mg/dL (ref 8.4–10.5)

## 2017-01-13 LAB — CALCIUM, IONIZED: CALCIUM ION: 5.5 mg/dL (ref 4.8–5.6)

## 2017-04-16 DIAGNOSIS — H43393 Other vitreous opacities, bilateral: Secondary | ICD-10-CM | POA: Diagnosis not present

## 2017-04-16 DIAGNOSIS — H50112 Monocular exotropia, left eye: Secondary | ICD-10-CM | POA: Diagnosis not present

## 2017-04-16 DIAGNOSIS — H2513 Age-related nuclear cataract, bilateral: Secondary | ICD-10-CM | POA: Diagnosis not present

## 2017-04-16 DIAGNOSIS — H401132 Primary open-angle glaucoma, bilateral, moderate stage: Secondary | ICD-10-CM | POA: Diagnosis not present

## 2017-05-02 DIAGNOSIS — K120 Recurrent oral aphthae: Secondary | ICD-10-CM | POA: Diagnosis not present

## 2017-05-21 DIAGNOSIS — H401123 Primary open-angle glaucoma, left eye, severe stage: Secondary | ICD-10-CM | POA: Diagnosis not present

## 2017-05-23 ENCOUNTER — Encounter: Payer: Self-pay | Admitting: Family Medicine

## 2017-05-23 ENCOUNTER — Ambulatory Visit (INDEPENDENT_AMBULATORY_CARE_PROVIDER_SITE_OTHER): Payer: Medicare Other | Admitting: Family Medicine

## 2017-05-23 DIAGNOSIS — R3915 Urgency of urination: Secondary | ICD-10-CM | POA: Insufficient documentation

## 2017-05-23 LAB — URINALYSIS, MICROSCOPIC ONLY
RBC / HPF: NONE SEEN (ref 0–?)
WBC UA: NONE SEEN (ref 0–?)

## 2017-05-23 LAB — POCT URINALYSIS DIPSTICK
Bilirubin, UA: NEGATIVE
GLUCOSE UA: NEGATIVE
Ketones, UA: NEGATIVE
Leukocytes, UA: NEGATIVE
NITRITE UA: NEGATIVE
PROTEIN UA: NEGATIVE
SPEC GRAV UA: 1.01 (ref 1.010–1.025)
UROBILINOGEN UA: 0.2 U/dL
pH, UA: 7 (ref 5.0–8.0)

## 2017-05-23 NOTE — Progress Notes (Signed)
   Subjective:  Patient ID: Jose Perry, male    DOB: 04-Jul-1941  Age: 76 y.o. MRN: [id number]  CC: Urinary urgency  HPI:  76 year old male with a history of BPH presents with urinary urgency.  Patient states that on Monday he developed severe urinary urgency. He states that this was worse than he has experienced previously. He states that he had been traveling and did not drink very much water. Upon his return he increase his fluid intake. He thinks this may be the culprit of his symptoms. No reports of dysuria. No other urinary symptoms. No abdominal pain. No known exacerbating factors. He states that on Tuesday he had improvement and is essentially asymptomatic today. He endorses compliance with his Flomax. No other complaints or concerns at this time.  Social Hx   Social History   Social History  . Marital status: Married    Spouse name: N/A  . Number of children: N/A  . Years of education: N/A   Social History Main Topics  . Smoking status: Never Smoker  . Smokeless tobacco: Never Used  . Alcohol use No  . Drug use: No  . Sexual activity: Not Asked   Other Topics Concern  . None   Social History Narrative  . None    Review of Systems  Constitutional: Negative.   Gastrointestinal: Negative.   Genitourinary: Positive for urgency. Negative for dysuria.   Objective:  BP 110/70 (BP Location: Left Arm, Patient Position: Sitting, Cuff Size: Normal)   Pulse (!) 52   Temp 97.6 F (36.4 C) (Oral)   Resp 16   Wt 156 lb 4 oz (70.9 kg)   SpO2 96%   BMI 22.74 kg/m   BP/Weight 05/23/2017 12/11/2016 04/07/1600  Systolic BP 093 235 573  Diastolic BP 70 72 72  Wt. (Lbs) 156.25 158.8 160.4  BMI 22.74 23.11 23.02    Physical Exam  Constitutional: He is oriented to person, place, and time. He appears well-developed. No distress.  Cardiovascular: Normal rate and regular rhythm.   No murmur heard. Pulmonary/Chest: Effort normal. He has no wheezes. He has no rales.    Abdominal: Soft. He exhibits no distension. There is no tenderness. There is no rebound and no guarding.  Neurological: He is alert and oriented to person, place, and time.  Psychiatric: He has a normal mood and affect.  Vitals reviewed.   Lab Results  Component Value Date   WBC 3.6 (L) 01/12/2017   HGB 13.4 01/12/2017   HCT 41.6 01/12/2017   PLT 117.0 (L) 01/12/2017   GLUCOSE 82 12/27/2016   CHOL 180 12/27/2016   TRIG 52.0 12/27/2016   HDL 74.60 12/27/2016   LDLCALC 95 12/27/2016   ALT 14 12/27/2016   AST 28 12/27/2016   NA 139 12/27/2016   K 3.9 12/27/2016   CL 105 12/27/2016   CREATININE 1.38 12/27/2016   BUN 19 12/27/2016   CO2 28 12/27/2016   TSH 2.09 12/27/2016   PSA 2.75 08/11/2014    Assessment & Plan:   Problem List Items Addressed This Visit    Urinary urgency    New acute problem. Urinalysis with trace blood.  Sending urine for micro and culture. Currently improved. Will awaiting studies.       Relevant Orders   POCT Urinalysis Dipstick (Completed)   Urine Culture   Urine Microscopic Only     Follow-up: PRN  [place]

## 2017-05-23 NOTE — Patient Instructions (Signed)
We will call with the results.  Everything looks alright.  Take care  Dr. Lacinda Axon

## 2017-05-23 NOTE — Assessment & Plan Note (Signed)
New acute problem. Urinalysis with trace blood.  Sending urine for micro and culture. Currently improved. Will awaiting studies.

## 2017-05-24 LAB — URINE CULTURE: Organism ID, Bacteria: NO GROWTH

## 2017-05-25 ENCOUNTER — Telehealth: Payer: Self-pay | Admitting: Internal Medicine

## 2017-05-25 NOTE — Telephone Encounter (Signed)
Pt called to follow up on lab results. Thank you!  Call pt @ [phone].

## 2017-05-31 NOTE — Telephone Encounter (Signed)
Called patient informed he received call the day that he called in and was given results.

## 2017-06-21 ENCOUNTER — Encounter: Payer: Self-pay | Admitting: Internal Medicine

## 2017-06-21 ENCOUNTER — Ambulatory Visit (INDEPENDENT_AMBULATORY_CARE_PROVIDER_SITE_OTHER): Payer: Medicare Other | Admitting: Internal Medicine

## 2017-06-21 VITALS — BP 130/80 | HR 63 | Temp 97.8°F | Ht 69.69 in | Wt 150.8 lb

## 2017-06-21 DIAGNOSIS — R3915 Urgency of urination: Secondary | ICD-10-CM | POA: Diagnosis not present

## 2017-06-21 DIAGNOSIS — K219 Gastro-esophageal reflux disease without esophagitis: Secondary | ICD-10-CM

## 2017-06-21 DIAGNOSIS — E041 Nontoxic single thyroid nodule: Secondary | ICD-10-CM

## 2017-06-21 DIAGNOSIS — D72819 Decreased white blood cell count, unspecified: Secondary | ICD-10-CM | POA: Diagnosis not present

## 2017-06-21 DIAGNOSIS — D1803 Hemangioma of intra-abdominal structures: Secondary | ICD-10-CM

## 2017-06-21 NOTE — Progress Notes (Signed)
Patient ID: Jose Perry, male   DOB: 15-Aug-1941, 76 y.o.   MRN: [id number]   Subjective:    Patient ID: Jose Perry, male    DOB: 21-Jul-1941, 76 y.o.   MRN: [id number]  HPI  Patient here for a scheduled follow up.  States he is doing well.  Has lost some weight.  He has been active.  Tries to watch what he eats.  No nausea or vomiting.  Bowels moving.  No abdominal pain.  No chest pain or tightness.  No sob.  Occasionally will notice some urinary urgency.  Some days better than others.  Plans to discuss with urology.  No dysuria.  Discussed f/u for his thyroid.     Past Medical History:  Diagnosis Date  . Elevated blood pressure   . Enlarged prostate   . GERD (gastroesophageal reflux disease)   . Glaucoma   . Hx of colonic polyps   . Leukopenia    has had an extensive w/up.    . Thyroid goiter    Past Surgical History:  Procedure Laterality Date  . BACK SURGERY  2002   ruptured disc  . EYE SURGERY  2009   relieve pressure  . THYROID SURGERY  1968   goiter   Family History  Problem Relation Age of Onset  . Heart disease Mother   . Alcohol abuse Father   . Hyperlipidemia Father   . Stroke Father    Social History   Social History  . Marital status: Married    Spouse name: N/A  . Number of children: N/A  . Years of education: N/A   Social History Main Topics  . Smoking status: Never Smoker  . Smokeless tobacco: Never Used  . Alcohol use No  . Drug use: No  . Sexual activity: Not Asked   Other Topics Concern  . None   Social History Narrative  . None    Outpatient Encounter Prescriptions as of 06/21/2017  Medication Sig  . bimatoprost (LUMIGAN) 0.03 % ophthalmic solution Place 1 drop into the right eye at bedtime.   Marland Kitchen omeprazole (PRILOSEC) 40 MG capsule   . SIMBRINZA 1-0.2 % SUSP 2 drops.  . TAMSULOSIN HCL PO Take 0.4 mg by mouth daily.   . [DISCONTINUED] latanoprost (XALATAN) 0.005 % ophthalmic solution Apply 2 drops to eye daily. To right eye only    No facility-administered encounter medications on file as of 06/21/2017.     Review of Systems  Constitutional: Negative for appetite change and unexpected weight change.  HENT: Negative for congestion and sinus pressure.   Respiratory: Negative for cough, chest tightness and shortness of breath.   Cardiovascular: Negative for chest pain, palpitations and leg swelling.  Gastrointestinal: Negative for abdominal pain, diarrhea, nausea and vomiting.  Genitourinary: Negative for difficulty urinating and dysuria.  Musculoskeletal: Negative for back pain and joint swelling.  Skin: Negative for color change and rash.  Neurological: Negative for dizziness, light-headedness and headaches.  Psychiatric/Behavioral: Negative for agitation and dysphoric mood.       Objective:     Blood pressure rechecked by me:  128/72  Physical Exam  Constitutional: He appears well-developed and well-nourished. No distress.  HENT:  Nose: Nose normal.  Mouth/Throat: Oropharynx is clear and moist.  Neck: Neck supple.  Cardiovascular: Normal rate and regular rhythm.   Pulmonary/Chest: Effort normal and breath sounds normal. No respiratory distress.  Abdominal: Soft. Bowel sounds are normal. There is no tenderness.  Musculoskeletal: He exhibits no edema  or tenderness.  Lymphadenopathy:    He has no cervical adenopathy.  Skin: No rash noted. No erythema.  Psychiatric: He has a normal mood and affect. His behavior is normal.    BP 130/80 (BP Location: Left Arm, Patient Position: Sitting, Cuff Size: Normal)   Pulse 63   Temp 97.8 F (36.6 C) (Oral)   Ht 5' 9.69" (1.77 m)   Wt 150 lb 12.8 oz (68.4 kg)   SpO2 97%   BMI 21.83 kg/m  Wt Readings from Last 3 Encounters:  06/21/17 150 lb 12.8 oz (68.4 kg)  05/23/17 156 lb 4 oz (70.9 kg)  12/11/16 158 lb 12.8 oz (72 kg)     Lab Results  Component Value Date   WBC 3.6 (L) 01/12/2017   HGB 13.4 01/12/2017   HCT 41.6 01/12/2017   PLT 117.0 (L)  01/12/2017   GLUCOSE 82 12/27/2016   CHOL 180 12/27/2016   TRIG 52.0 12/27/2016   HDL 74.60 12/27/2016   LDLCALC 95 12/27/2016   ALT 14 12/27/2016   AST 28 12/27/2016   NA 139 12/27/2016   K 3.9 12/27/2016   CL 105 12/27/2016   CREATININE 1.38 12/27/2016   BUN 19 12/27/2016   CO2 28 12/27/2016   TSH 2.09 12/27/2016   PSA 2.75 08/11/2014    US Soft Tissue Head/neck  Result Date: 06/21/2016 CLINICAL DATA:  76 year old male with a history of prior right thyroidectomy and a thyroid nodule seen on prior CT scan. EXAM: THYROID ULTRASOUND TECHNIQUE: Ultrasound examination of the thyroid gland and adjacent soft tissues was performed. COMPARISON:  Prior CT scan of the chest 08/30/2015 FINDINGS: Parenchymal Echotexture: Markedly heterogenous Estimated total number of nodules >/= 1 cm: 2 Number of spongiform nodules > 2 cm not described below (TR1): 0 Number of mixed cystic and solid nodules > 1.5 cm not described below ([place]): 0 _________________________________________________________ Isthmus: 1.0 cm Isoechoic mixed cystic and solid nodule in the thyroid isthmus does not meet consensus criteria for either biopsy or dedicated follow-up. _________________________________________________________ Right lobe: Surgically absent. _________________________________________________________ Left lobe: 5.0 x 2.8 cm Nodule # 1: Location: Left; Mid Size: 3.7 x 2.9 x 2.5 cm Composition: solid/almost completely solid (2) Echogenicity: hyperechoic (1) Shape: not taller-than-wide (0) Margins: smooth (0) Echogenic foci: none (0) ACR TI-RADS total points: 3. ACR TI-RADS risk category: TR3 (3 points). ACR TI-RADS recommendations: **Given size (>2.5 cm) and appearance, fine needle aspiration of this mildly suspicious nodule should be considered based on TI-RADS criteria. Nodule # 2: Location: Left; Inferior Size: 1.7 x 1.1 x 1.2 cm Composition: cannot determine (2) Echogenicity: very hypoechoic (3) Shape: not taller-than-wide  (0) Margins: ill-defined (0) Echogenic foci: none (0) ACR TI-RADS total points: 5. ACR TI-RADS risk category: TR4 (4-6 points). ACR TI-RADS recommendations: **Given size (>1.5 cm) and appearance, fine needle aspiration of this moderately suspicious nodule should be considered based on TI-RADS criteria. IMPRESSION: 1. Left mid -inferior Nodule #1: ACR TI-RADS TR 3. Recommend: Given size (>2.5 cm) and appearance, fine needle aspiration of this mildly suspicious nodule should be considered based on TI-RADS criteria. 2. Left inferior Nodule #2: ACR TI-RADS TR4. Recommend: Given size (>1.5 cm) and appearance, fine needle aspiration of this moderately suspicious nodule should be considered based on TI-RADS criteria. 3. Surgical changes of prior right thyroidectomy. The above is in keeping with the ACR TI-RADS recommendations - J Am Coll Radiol 2017;14:587-595. Electronically Signed   By: Jacqulynn Cadet [name]   On: 06/21/2016 12:52  Assessment & Plan:   Problem List Items Addressed This Visit    GERD (gastroesophageal reflux disease)    Controlled on omeprazole.        Relevant Medications   omeprazole (PRILOSEC) 40 MG capsule   Leukopenia    White count stable on last check.  Follow cbc.        Thyroid nodule    Evaluated by endocrinology.  S/p biopsy.  Recommended f/u in one year.        Relevant Orders   Ambulatory referral to Endocrinology   Urinary urgency    Improved.  Recently checked.  No infection.  Plans to discuss with urology.         Other Visit Diagnoses    Liver hemangioma    -  Primary   Relevant Orders   CT ABDOMEN W CONTRAST       Einar Pheasant, MD

## 2017-06-23 ENCOUNTER — Encounter: Payer: Self-pay | Admitting: Internal Medicine

## 2017-06-23 NOTE — Assessment & Plan Note (Signed)
White count stable on last check.  Follow cbc.

## 2017-06-23 NOTE — Assessment & Plan Note (Signed)
Improved.  Recently checked.  No infection.  Plans to discuss with urology.

## 2017-06-23 NOTE — Assessment & Plan Note (Signed)
Evaluated by endocrinology.  S/p biopsy.  Recommended f/u in one year.

## 2017-06-23 NOTE — Assessment & Plan Note (Signed)
Controlled on omeprazole.   

## 2017-07-03 ENCOUNTER — Telehealth: Payer: Self-pay | Admitting: Internal Medicine

## 2017-07-03 DIAGNOSIS — D1803 Hemangioma of intra-abdominal structures: Secondary | ICD-10-CM

## 2017-07-03 DIAGNOSIS — R9389 Abnormal findings on diagnostic imaging of other specified body structures: Secondary | ICD-10-CM

## 2017-07-03 NOTE — Telephone Encounter (Signed)
Courtney from centralized scheduling called and asked for this pt's CT be changed to abdomen/pelvis with and without contrast. She will link it once she receives it.

## 2017-07-03 NOTE — Telephone Encounter (Signed)
Order placed for CT abdomen and pelvis.  

## 2017-07-06 ENCOUNTER — Ambulatory Visit
Admission: RE | Admit: 2017-07-06 | Discharge: 2017-07-06 | Disposition: A | Discharge status: Home / Self Care | Payer: Medicare Other | Source: Ambulatory Visit | Attending: Internal Medicine | Admitting: Internal Medicine

## 2017-07-06 DIAGNOSIS — K7689 Other specified diseases of liver: Secondary | ICD-10-CM | POA: Diagnosis not present

## 2017-07-06 DIAGNOSIS — D1803 Hemangioma of intra-abdominal structures: Secondary | ICD-10-CM | POA: Insufficient documentation

## 2017-07-06 LAB — POCT I-STAT CREATININE: Creatinine, Ser: 1.1 mg/dL (ref 0.61–1.24)

## 2017-07-06 MED ORDER — IOPAMIDOL (ISOVUE-370) INJECTION 76%
100.0000 mL | Freq: Once | INTRAVENOUS | Status: AC | PRN
  Administered 2017-07-06: 100 mL via INTRAVENOUS

## 2017-07-09 ENCOUNTER — Other Ambulatory Visit: Payer: Self-pay

## 2017-07-12 ENCOUNTER — Telehealth: Payer: Self-pay

## 2017-07-12 NOTE — Telephone Encounter (Signed)
  Oncology Nurse Navigator Documentation Jose Perry was presented at tumor board today for radiology review. Recommendation of tumor board: Stable hemangioma for 5 years. No further imaging indicated. Follow symptoms, as this area is quite large and could be a risk for bleeding. Navigator Location: [facility] (07/12/17 1300)   )Navigator Encounter Type: Other (07/12/17 1300)                                                    Time Spent with Patient: 15 (07/12/17 1300)

## 2017-07-13 NOTE — Telephone Encounter (Signed)
Will notify pt.  See lab result note.

## 2017-07-27 DIAGNOSIS — N529 Male erectile dysfunction, unspecified: Secondary | ICD-10-CM | POA: Diagnosis not present

## 2017-07-27 DIAGNOSIS — N401 Enlarged prostate with lower urinary tract symptoms: Secondary | ICD-10-CM | POA: Diagnosis not present

## 2017-07-27 DIAGNOSIS — R339 Retention of urine, unspecified: Secondary | ICD-10-CM | POA: Diagnosis not present

## 2017-07-27 DIAGNOSIS — R972 Elevated prostate specific antigen [PSA]: Secondary | ICD-10-CM | POA: Diagnosis not present

## 2017-07-27 DIAGNOSIS — N138 Other obstructive and reflux uropathy: Secondary | ICD-10-CM | POA: Diagnosis not present

## 2017-08-03 ENCOUNTER — Telehealth: Payer: Self-pay | Admitting: Internal Medicine

## 2017-08-03 NOTE — Telephone Encounter (Signed)
Pt's BP has been running between 170-90 and 150/90. Pt would like to see Dr. Nicki Reaper. Please call pt at [phone].

## 2017-08-03 NOTE — Telephone Encounter (Signed)
Confirm doing ok.  Continue to monitor.  Will need to work in next week for appt.  Please send message to [facility] to hold for work in appt and I will try to find a place for him.  If any problems prior, needs evaluation.

## 2017-08-03 NOTE — Telephone Encounter (Signed)
Patient is checking BP daily and he states it runs high before he works out , then after is work for [employer] his BP will come down around 130/80 but as the day goes on his BP starts to creep back up on him. Running between 150/90 - 170/90 with pulse between 65-55

## 2017-08-03 NOTE — Telephone Encounter (Signed)
Patient aware and voiced understanding to instructions from PCP, see note below for workin next week.

## 2017-08-20 ENCOUNTER — Ambulatory Visit (INDEPENDENT_AMBULATORY_CARE_PROVIDER_SITE_OTHER): Payer: Medicare Other

## 2017-08-20 VITALS — BP 132/80 | HR 60 | Temp 98.0°F | Resp 14 | Ht 70.0 in | Wt 158.1 lb

## 2017-08-20 DIAGNOSIS — Z23 Encounter for immunization: Secondary | ICD-10-CM | POA: Diagnosis not present

## 2017-08-20 DIAGNOSIS — Z0000 Encounter for general adult medical examination without abnormal findings: Secondary | ICD-10-CM

## 2017-08-20 DIAGNOSIS — Z1331 Encounter for screening for depression: Secondary | ICD-10-CM

## 2017-08-20 NOTE — Progress Notes (Signed)
Subjective:   Jose Perry is a 76 y.o. male who presents for Medicare Annual/Subsequent preventive examination.  Review of Systems:  No ROS.  Medicare Wellness Visit. Additional risk factors are reflected in the social history.  Cardiac Risk Factors include: advanced age (>49men, >61 women);male gender     Objective:    Vitals: BP 132/80 (BP Location: Left Arm, Patient Position: Sitting, Cuff Size: Normal)   Pulse 60   Temp 98 F (36.7 C) (Oral)   Resp 14   Ht 5\' 10"  (1.778 m)   Wt 158 lb 1.9 oz (71.7 kg)   SpO2 98%   BMI 22.69 kg/m   Body mass index is 22.69 kg/m.  Tobacco Social History   Tobacco Use  Smoking Status Never Smoker  Smokeless Tobacco Never Used     Counseling given: Not Answered   Past Medical History:  Diagnosis Date  . Elevated blood pressure   . Enlarged prostate   . GERD (gastroesophageal reflux disease)   . Glaucoma   . Hx of colonic polyps   . Leukopenia    has had an extensive w/up.    . Thyroid goiter    Past Surgical History:  Procedure Laterality Date  . BACK SURGERY  2002   ruptured disc  . EYE SURGERY  2009   relieve pressure  . THYROID SURGERY  1968   goiter   Family History  Problem Relation Age of Onset  . Heart disease Mother   . Alcohol abuse Father   . Hyperlipidemia Father   . Stroke Father    Social History   Substance and Sexual Activity  Sexual Activity Not on file    Outpatient Encounter Medications as of 08/20/2017  Medication Sig  . bimatoprost (LUMIGAN) 0.03 % ophthalmic solution Place 1 drop into the right eye at bedtime.   Marland Kitchen omeprazole (PRILOSEC) 40 MG capsule   . SIMBRINZA 1-0.2 % SUSP 2 drops.  . TAMSULOSIN HCL PO Take 0.4 mg by mouth daily.    No facility-administered encounter medications on file as of 08/20/2017.     Activities of Daily Living In your present state of health, do you have any difficulty performing the following activities: 08/20/2017  Hearing? N  Vision? N    Difficulty concentrating or making decisions? N  Walking or climbing stairs? N  Dressing or bathing? N  Doing errands, shopping? N  Preparing Food and eating ? N  Using the Toilet? N  In the past six months, have you accidently leaked urine? N  Do you have problems with loss of bowel control? N  Managing your Medications? N  Managing your Finances? N  Housekeeping or managing your Housekeeping? N  Some recent data might be hidden    Patient Care Team: Einar Pheasant, MD as PCP - General (Internal Medicine)   Assessment:    This is a routine wellness examination for Jose Perry. The goal of the wellness visit is to assist the patient how to close the gaps in care and create a preventative care plan for the patient.   The roster of all physicians providing medical care to patient is listed in the Snapshot section of the chart.  Osteoporosis risk reviewed.    Safety issues reviewed; Smoke and carbon monoxide detectors in the home. No firearms in the home.  Wears seatbelts when driving or riding with others. Patient does wear sunscreen or protective clothing when in direct sunlight. No violence in the home.  Depression- PHQ 2 &9  complete.  No signs/symptoms or verbal communication regarding little pleasure in doing things, feeling down, depressed or hopeless. No changes in sleeping, energy, eating, concentrating.  No thoughts of self harm or harm towards others.  Time spent on this topic is 8 minutes.   Patient is alert, normal appearance, oriented to person/place/and time. Correctly identified the president of the [place], recall of 3/3 words, and performing simple calculations. Displays appropriate judgement and can read correct time from watch face.   No new identified risk were noted.  No failures at ADL's or IADL's.    BMI- discussed the importance of a healthy diet, water intake and the benefits of aerobic exercise. Educational material provided.   24 hour diet recall: Low sodium  diet  Daily fluid intake: 0 cups of caffeine, 8 cups of water  Dental- every 3 months.  Dr. Mel Almond.  Eye- Visual acuity not assessed per patient preference since they have regular follow up with the ophthalmologist.  Visits every 6 months. Wears corrective lenses when reading.  Sleep patterns- Sleeps 7-8 hours at night.  Nocturia x1-2. Wakes feeling rested.  Naps during the day as needed.  High dose influenza vaccine administered R deltoid, tolerated well. Educational material provided.  TDAP vaccine deferred per patient preference.  Follow up with insurance.  Educational material provided.  Shingles vaccine (Recombinant) discussed.  Educational material provided.  Colonoscopy consult scheduled with Dr. Tiffany Kocher 08/21/17.  COLOguard discussed as an option.  Educational material provided.  Patient Concerns: Fluctuating BP readings at home. No headaches, blurred vision, dizziness. Log placed in PCP box.  See PCP note of need to work in; scheduled 09/19/17, bring BP machine to visit.  Reoccurring altered breathing patterns; Hx of childhood asthma.   No chest pain.  SOB on exertion.  Note sent and deferred to PCP for follow up.  Exercise Activities and Dietary recommendations Current Exercise Habits: Home exercise routine, Type of exercise: strength training/weights;stretching;calisthenics, Time (Minutes): > 60, Intensity: Intense  Fall Risk Fall Risk  08/20/2017 06/21/2017 08/18/2016 07/04/2016 06/13/2016  Falls in the past year? No No No No No   Depression Screen PHQ 2/9 Scores 08/20/2017 06/21/2017 08/18/2016 07/04/2016  PHQ - 2 Score 0 0 0 0  PHQ- 9 Score 0 - - -    Cognitive Function MMSE - Mini Mental State Exam 08/20/2017 08/18/2016  Orientation to time 5 5  Orientation to Place 5 5  Registration 3 3  Attention/ Calculation 5 5  Recall 3 3  Language- name 2 objects 2 2  Language- repeat 1 1  Language- follow 3 step command 3 3  Language- read & follow direction 1 1  Write a  sentence 1 1  Copy design 1 1  Total score 30 30     6CIT Screen 08/18/2016  What Year? 0 points  What month? 0 points  What time? 0 points  Count back from 20 0 points  Months in reverse 0 points    Immunization History  Administered Date(s) Administered  . Influenza, High Dose Seasonal PF 06/13/2016, 08/20/2017  . Influenza,inj,Quad PF,6+ Mos 07/20/2015  . Pneumococcal Conjugate-13 06/10/2015  . Pneumococcal Polysaccharide-23 06/30/2013  . Zoster 06/30/2013   Screening Tests Health Maintenance  Topic Date Due  . TETANUS/TDAP  01/25/1961  . INFLUENZA VACCINE  05/09/2017  . COLONOSCOPY  07/08/2024  . PNA vac Low Risk Adult  Completed      Plan:   End of life planning; Advanced aging; Advanced directives discussed.  No HCPOA/Living Will.  Additional information provided to help them start the conversation with family.  Copy of HCPOA/Living Will requested upon completion. Time spent on this topic is 20 minutes.  I have personally reviewed and noted the following in the patient's chart:   . Medical and social history . Use of alcohol, tobacco or illicit drugs  . Current medications and supplements . Functional ability and status . Nutritional status . Physical activity . Advanced directives . List of other physicians . Hospitalizations, surgeries, and ER visits in previous 12 months . Vitals . Screenings to include cognitive, depression, and falls . Referrals and appointments  In addition, I have reviewed and discussed with patient certain preventive protocols, quality metrics, and best practice recommendations. A written personalized care plan for preventive services as well as general preventive health recommendations were provided to patient.     OBrien-Blaney, Tristain Daily L, LPN  [id number]/[id number]   I have reviewed the above information and agree with above.   Deborra Medina, MD

## 2017-08-20 NOTE — Patient Instructions (Addendum)
  [name] , Thank you for taking time to come for your Medicare Wellness Visit. I appreciate your ongoing commitment to your health goals. Please review the following plan we discussed and let me know if I can assist you in the future.   Follow up with Dr. Nicki Reaper as needed.    Bring a copy of your [place] and/or Living Will to be scanned into chart once completed.  Have a great day!  Bring blood pressure machine and log to next visit.  These are the goals we discussed: Stay active, stay hydrated, healthy diet.   This is a list of the screening recommended for you and due dates:  Health Maintenance  Topic Date Due  . Tetanus Vaccine  01/25/1961  . Flu Shot  05/09/2017  . Colon Cancer Screening  07/08/2024  . Pneumonia vaccines  Completed

## 2017-08-21 ENCOUNTER — Emergency Department
Admission: EM | Admit: 2017-08-21 | Discharge: 2017-08-21 | Disposition: A | Discharge status: Home / Self Care | Payer: Medicare Other | Attending: Emergency Medicine | Admitting: Emergency Medicine

## 2017-08-21 ENCOUNTER — Telehealth: Payer: Self-pay

## 2017-08-21 ENCOUNTER — Other Ambulatory Visit: Payer: Self-pay

## 2017-08-21 ENCOUNTER — Emergency Department: Payer: Medicare Other

## 2017-08-21 DIAGNOSIS — Z79899 Other long term (current) drug therapy: Secondary | ICD-10-CM | POA: Diagnosis not present

## 2017-08-21 DIAGNOSIS — R0602 Shortness of breath: Secondary | ICD-10-CM | POA: Insufficient documentation

## 2017-08-21 LAB — COMPREHENSIVE METABOLIC PANEL
ALBUMIN: 4.3 g/dL (ref 3.5–5.0)
ALT: 18 U/L (ref 17–63)
AST: 33 U/L (ref 15–41)
Alkaline Phosphatase: 67 U/L (ref 38–126)
Anion gap: 8 (ref 5–15)
BUN: 13 mg/dL (ref 6–20)
CHLORIDE: 102 mmol/L (ref 101–111)
CO2: 28 mmol/L (ref 22–32)
Calcium: 9.8 mg/dL (ref 8.9–10.3)
Creatinine, Ser: 1.21 mg/dL (ref 0.61–1.24)
GFR calc Af Amer: >60 mL/min (ref >60)
GFR calc non Af Amer: 57 mL/min — ABNORMAL LOW (ref >60)
GLUCOSE: 96 mg/dL (ref 65–99)
POTASSIUM: 3.7 mmol/L (ref 3.5–5.1)
SODIUM: 138 mmol/L (ref 135–145)
Total Bilirubin: 0.9 mg/dL (ref 0.3–1.2)
Total Protein: 7.4 g/dL (ref 6.5–8.1)

## 2017-08-21 LAB — CBC
HEMATOCRIT: 42.6 % (ref 40.0–52.0)
Hemoglobin: 13.9 g/dL (ref 13.0–18.0)
MCH: 27.4 pg (ref 26.0–34.0)
MCHC: 32.7 g/dL (ref 32.0–36.0)
MCV: 83.8 fL (ref 80.0–100.0)
Platelets: 126 10*3/uL — ABNORMAL LOW (ref 150–440)
RBC: 5.08 MIL/uL (ref 4.40–5.90)
RDW: 14.8 % — AB (ref 11.5–14.5)
WBC: 4.4 10*3/uL (ref 3.8–10.6)

## 2017-08-21 LAB — TROPONIN I
Troponin I: <0.03 ng/mL (ref <0.03)
Troponin I: <0.03 ng/mL (ref <0.03)

## 2017-08-21 LAB — BRAIN NATRIURETIC PEPTIDE: B Natriuretic Peptide: 60 pg/mL (ref 0.0–100.0)

## 2017-08-21 MED ORDER — IPRATROPIUM-ALBUTEROL 0.5-2.5 (3) MG/3ML IN SOLN
RESPIRATORY_TRACT | Status: AC
  Administered 2017-08-21: 3 mL via RESPIRATORY_TRACT
  Filled 2017-08-21: qty 3

## 2017-08-21 MED ORDER — ALBUTEROL SULFATE HFA 108 (90 BASE) MCG/ACT IN AERS
2.0000 | INHALATION_SPRAY | Freq: Four times a day (QID) | RESPIRATORY_TRACT | 0 refills | Status: DC | PRN
Start: 2017-08-21 — End: 2018-02-26

## 2017-08-21 MED ORDER — IPRATROPIUM-ALBUTEROL 0.5-2.5 (3) MG/3ML IN SOLN
3.0000 mL | Freq: Once | RESPIRATORY_TRACT | Status: AC
  Administered 2017-08-21: 3 mL via RESPIRATORY_TRACT

## 2017-08-21 NOTE — Telephone Encounter (Signed)
Patient dropped off BP readings for your review. Placed in blue folder.

## 2017-08-21 NOTE — ED Provider Notes (Signed)
[facility] Emergency Department Provider Note   ____________________________________________   First MD Initiated Contact with Patient 08/21/17 0111     (approximate)  I have reviewed the triage vital signs and the nursing notes.   HISTORY  Chief Complaint Shortness of Breath    HPI Jose Perry is a 76 y.o. male who comes into the hospital today thinking that he is having an asthma attack.  The patient states that he had asthma as a child but he has not been treated for it in 25 years.  He went to his 68 office this afternoon and when he left he thinks that his throat was reacting to the cold weather.  He states that he was breathing a little tight.  He went home and thought it settled out but this evening when he went into the shower he states that he was still having some difficulty breathing and felt like he could not get air.  The patient denies any chest pain or dizziness.  He is also had no lightheadedness.  He states that he has been sneezing a lot but no cough or runny nose.  The patient does not have any breathing treatments at home so he decided to come into the hospital today for evaluation.   Past Medical History:  Diagnosis Date  . Elevated blood pressure   . Enlarged prostate   . GERD (gastroesophageal reflux disease)   . Glaucoma   . Hx of colonic polyps   . Leukopenia    has had an extensive w/up.    . Thyroid goiter     Patient Active Problem List   Diagnosis Date Noted  . Urinary urgency 05/23/2017  . Thyroid nodule 06/13/2016  . Bruit 06/14/2015  . Health care maintenance 12/13/2014  . Glaucoma 09/21/2014  . Goiter 06/28/2014  . Elevated blood pressure reading 06/28/2014  . Leukopenia 06/28/2014  . History of colonic polyps 06/28/2014  . Enlarged prostate 06/28/2014  . GERD (gastroesophageal reflux disease) 06/28/2014    Past Surgical History:  Procedure Laterality Date  . BACK SURGERY  2002   ruptured disc    . EYE SURGERY  2009   relieve pressure  . THYROID SURGERY  1968   goiter    Prior to Admission medications   Medication Sig Start Date End Date Taking? Authorizing Provider  bimatoprost (LUMIGAN) 0.03 % ophthalmic solution Place 1 drop into the right eye at bedtime.    Yes [provider]  omeprazole (PRILOSEC) 40 MG capsule Take 40 mg daily by mouth.  06/10/17  Yes [provider]  SIMBRINZA 1-0.2 % SUSP Place 2 drops daily at 12 noon into the right eye.  06/10/16  Yes [provider]  TAMSULOSIN HCL PO Take 0.4 mg by mouth daily.    Yes [provider]  albuterol (PROVENTIL HFA;VENTOLIN HFA) 108 (90 Base) MCG/ACT inhaler Inhale 2 puffs every 6 (six) hours as needed into the lungs. 08/21/17   Loney Hering, MD    Allergies Patient has no known allergies.  Family History  Problem Relation Age of Onset  . Heart disease Mother   . Alcohol abuse Father   . Hyperlipidemia Father   . Stroke Father     Social History Social History   Tobacco Use  . Smoking status: Never Smoker  . Smokeless tobacco: Never Used  Substance Use Topics  . Alcohol use: No    Alcohol/week: 0.0 oz  . Drug use: No  Review of Systems  Constitutional: No fever/chills Eyes: No visual changes. ENT: No sore throat. Cardiovascular: Denies chest pain. Respiratory:  shortness of breath. Gastrointestinal: No abdominal pain.  No nausea, no vomiting.  No diarrhea.  No constipation. Genitourinary: Negative for dysuria. Musculoskeletal: Negative for back pain. Skin: Negative for rash. Neurological: Negative for headaches, focal weakness or numbness.   ____________________________________________   PHYSICAL EXAM:  VITAL SIGNS: ED Triage Vitals [08/21/17 0059]  Enc Vitals Group     BP (!) 203/106     Pulse Rate 71     Resp (!) 24     Temp 97.6 F (36.4 C)     Temp Source Oral     SpO2 97 %     Weight      Height      Head Circumference      Peak Flow       Pain Score      Pain Loc      Pain Edu?      Excl. in [place]?     Constitutional: Alert and oriented. Well appearing and in moderate respiratory distress. Eyes: Conjunctivae are normal. PERRL. EOMI. Head: Atraumatic. Nose: No congestion/rhinnorhea. Mouth/Throat: Mucous membranes are moist.  Oropharynx non-erythematous. Cardiovascular: Normal rate, regular rhythm. Grossly normal heart sounds.  Good peripheral circulation. Respiratory: Increased respiratory effort.  No retractions.  Diminished breath sounds throughout. Gastrointestinal: Soft and nontender. No distention.  Musculoskeletal: No lower extremity tenderness nor edema.   Neurologic:  Normal speech and language.  Skin:  Skin is warm, dry and intact.  Psychiatric: Mood and affect are normal. .  ____________________________________________   LABS (all labs ordered are listed, but only abnormal results are displayed)  Labs Reviewed  CBC - Abnormal; Notable for the following components:      Result Value   RDW 14.8 (*)    Platelets 126 (*)    All other components within normal limits  COMPREHENSIVE METABOLIC PANEL - Abnormal; Notable for the following components:   GFR calc non Af Amer 57 (*)    All other components within normal limits  BLOOD GAS, VENOUS - Abnormal; Notable for the following components:   Bicarbonate 29.9 (*)    Acid-Base Excess 3.2 (*)    All other components within normal limits  TROPONIN I  BRAIN NATRIURETIC PEPTIDE  TROPONIN I   ____________________________________________  EKG  ED ECG REPORT I, Loney Hering, the attending physician, personally viewed and interpreted this ECG.   Date: 08/21/2017  EKG Time: 607  Rate: 62  Rhythm: normal sinus rhythm  Axis: normal  Intervals:none  ST&T Change: none  ____________________________________________  RADIOLOGY  Dg Chest 2 View  Result Date: 08/21/2017 CLINICAL DATA:  76 year old male with shortness of breath. EXAM: CHEST  2 VIEW  COMPARISON:  Chest CT dated 08/30/2015 FINDINGS: A 5 mm nodular appearing density superimposed over the anterior left fifth rib may represent a pulmonary nodule corresponding to the nodule seen on the prior CT (series 11, image 48) or nipple shadow. Repeat radiograph with placement of nipple markers may provide better evaluation. The lungs are clear. There is no pleural effusion or pneumothorax. The cardiac silhouette is within normal limits. No acute osseous pathology. IMPRESSION: 1. No acute cardiopulmonary process. 2. Probable 5 mm pulmonary nodule in the left lung. Electronically Signed   By: Anner Crete [name]   On: 08/21/2017 02:31    ____________________________________________   PROCEDURES  Procedure(s) performed: None  Procedures  Critical Care performed: No  ____________________________________________   INITIAL IMPRESSION / ASSESSMENT AND PLAN / ED COURSE  As part of my medical decision making, I reviewed the following data within the electronic MEDICAL RECORD NUMBER Notes from prior ED visits and [place] Controlled Substance Database   This is a 76 year old male who comes into the hospital today with shortness of breath.  The patient believes that he is having an asthma attack.  He has not had problems with asthma in some time.  His lung sounds are diminished but he is not wheezing.  My differential diagnosis includes acute coronary syndrome, heart failure, COPD, asthma.  I did check some blood work on the patient.  His heart enzymes are unremarkable and his blood work is negative.  I did give the patient a DuoNeb treatment in the event that his wheezing was so tight as to cause diminished breath sounds.  After the DuoNeb treatment the patient felt much improved. We did repeat the troponin and disposition the patient  The repeat troponin is negative.  The patient will be discharged home to follow-up.      ____________________________________________   FINAL CLINICAL  IMPRESSION(S) / ED DIAGNOSES  Final diagnoses:  Shortness of breath     ED Discharge Orders        Ordered    albuterol (PROVENTIL HFA;VENTOLIN HFA) 108 (90 Base) MCG/ACT inhaler  Every 6 hours PRN     08/21/17 0608       Note:  This document was prepared using Dragon voice recognition software and may include unintentional dictation errors.    Loney Hering, MD 08/21/17 [id number]

## 2017-08-21 NOTE — Discharge Instructions (Signed)
Please follow up with your primary care physician for further evaluation of your shortness of breath.

## 2017-08-21 NOTE — ED Notes (Signed)

## 2017-08-21 NOTE — ED Triage Notes (Addendum)
Reports feeling short of breath a few days ago and started back tonight.  Lungs with diminished sounds bilaterally.

## 2017-08-21 NOTE — Telephone Encounter (Signed)
Reviewed his blood pressure readings.  His pressures on some checks are elevated with rechecks improved.  Have him to continue to monitor his pressures.  Also see message in response to your message to me, need to confirm doing ok.  Was evaluated in ER recently.  Also, someone has been placed in the 08/29/17 time slot.  Will need to work in, just need to know how he is doing.

## 2017-08-22 ENCOUNTER — Telehealth: Payer: Self-pay | Admitting: *Deleted

## 2017-08-22 ENCOUNTER — Telehealth: Payer: Self-pay

## 2017-08-22 NOTE — Telephone Encounter (Signed)
FYI   Copied from [place] [id number]. Topic: Inquiry >> Aug 22, 2017 11:53 AM Corie Chiquito, [place] wrote: Reason for CRM: Patient calling to let Dr. Nicki Reaper know that he was seen and treated for asthma Tuesday night at the hospital

## 2017-08-22 NOTE — Telephone Encounter (Signed)
Resolved in other note.

## 2017-08-22 NOTE — Telephone Encounter (Signed)
Copied from [place] [id number]. Topic: Inquiry >> Aug 21, 2017  5:51 PM Oliver Pila B wrote: Reason for CRM: pt called regarding a phone call he got this evening, asked him about some blood readings but he didn't have a clue on what that was so contact pt if needed

## 2017-08-22 NOTE — Telephone Encounter (Signed)
Spoke with patient and he is doing fine with no reoccurring symptoms.  He is using an inhaler every 6 hours as directed.  Will follow with work in appointment.  He is open to take the next available.

## 2017-08-22 NOTE — Telephone Encounter (Signed)
Per Denisa she had take care of this

## 2017-08-22 NOTE — Telephone Encounter (Signed)
See messages I sent you regarding this pt.  Please call and see how pt is doing.

## 2017-08-23 NOTE — Telephone Encounter (Signed)
There is a cancellation 10:30 - 08/23/17.  Please call pt and see if he can come at this time.  Thanks

## 2017-08-23 NOTE — Telephone Encounter (Signed)
Can you schedule at 8:00 on 08/28/17.  Thanks.  Please block the slot.

## 2017-08-23 NOTE — Telephone Encounter (Signed)
appt was already taken by [facility]

## 2017-08-23 NOTE — Telephone Encounter (Signed)
appt came available  called and scheduled pt for 11/19 @ 1:30

## 2017-08-27 ENCOUNTER — Ambulatory Visit (INDEPENDENT_AMBULATORY_CARE_PROVIDER_SITE_OTHER): Payer: Medicare Other | Admitting: Internal Medicine

## 2017-08-27 ENCOUNTER — Encounter: Payer: Self-pay | Admitting: Internal Medicine

## 2017-08-27 ENCOUNTER — Ambulatory Visit (INDEPENDENT_AMBULATORY_CARE_PROVIDER_SITE_OTHER): Payer: Medicare Other

## 2017-08-27 VITALS — BP 136/80 | HR 64 | Temp 97.7°F | Resp 16 | Wt 158.4 lb

## 2017-08-27 DIAGNOSIS — R911 Solitary pulmonary nodule: Secondary | ICD-10-CM

## 2017-08-27 DIAGNOSIS — D72819 Decreased white blood cell count, unspecified: Secondary | ICD-10-CM | POA: Diagnosis not present

## 2017-08-27 DIAGNOSIS — I10 Essential (primary) hypertension: Secondary | ICD-10-CM

## 2017-08-27 DIAGNOSIS — K219 Gastro-esophageal reflux disease without esophagitis: Secondary | ICD-10-CM | POA: Diagnosis not present

## 2017-08-27 DIAGNOSIS — R9389 Abnormal findings on diagnostic imaging of other specified body structures: Secondary | ICD-10-CM

## 2017-08-27 DIAGNOSIS — Z23 Encounter for immunization: Secondary | ICD-10-CM | POA: Diagnosis not present

## 2017-08-27 DIAGNOSIS — J4520 Mild intermittent asthma, uncomplicated: Secondary | ICD-10-CM

## 2017-08-27 MED ORDER — AMLODIPINE BESYLATE 5 MG PO TABS: 5.0000 mg | ORAL_TABLET | Freq: Every day | ORAL | 2 refills | Status: DC

## 2017-08-27 NOTE — Progress Notes (Signed)
Patient ID: Jose Perry, male   DOB: 02/21/1941, 76 y.o.   MRN: [id number]   Subjective:    Patient ID: Jose Perry, male    DOB: 1941-06-21, 76 y.o.   MRN: [id number]  HPI  Patient here as a work in to discuss his blood pressure and recent ER visit.  He was evaluated secondary to difficulty breathing.  Note reviewed.  Was given nebulizer treatment.  Felt better with treatment.  Breathing better.  States had childhood asthma.  Has not had issues since he was a child until this.  No chest pain.  Breathing better.  On prilosec for acid reflux.  No abdominal pain. Bowels moving.  Blood pressure is remaining elevated.  Blood pressures averaging 130-150/80-90.  No fever.  No chest congestion.  Feels better.    Past Medical History:  Diagnosis Date  . Elevated blood pressure   . Enlarged prostate   . GERD (gastroesophageal reflux disease)   . Glaucoma   . Hx of colonic polyps   . Leukopenia    has had an extensive w/up.    . Thyroid goiter    Past Surgical History:  Procedure Laterality Date  . BACK SURGERY  2002   ruptured disc  . EYE SURGERY  2009   relieve pressure  . THYROID SURGERY  1968   goiter   Family History  Problem Relation Age of Onset  . Heart disease Mother   . Alcohol abuse Father   . Hyperlipidemia Father   . Stroke Father    Social History   Socioeconomic History  . Marital status: Married    Spouse name: None  . Number of children: None  . Years of education: None  . Highest education level: None  Social Needs  . Financial resource strain: None  . Food insecurity - worry: None  . Food insecurity - inability: None  . Transportation needs - medical: None  . Transportation needs - non-medical: None  Occupational History  . None  Tobacco Use  . Smoking status: Never Smoker  . Smokeless tobacco: Never Used  Substance and Sexual Activity  . Alcohol use: No    Alcohol/week: 0.0 oz  . Drug use: No  . Sexual activity: None  Other Topics Concern  .  None  Social History Narrative  . None    Outpatient Encounter Medications as of 08/27/2017  Medication Sig  . albuterol (PROVENTIL HFA;VENTOLIN HFA) 108 (90 Base) MCG/ACT inhaler Inhale 2 puffs every 6 (six) hours as needed into the lungs.  . bimatoprost (LUMIGAN) 0.03 % ophthalmic solution Place 1 drop into the right eye at bedtime.   Marland Kitchen omeprazole (PRILOSEC) 40 MG capsule Take 40 mg daily by mouth.   Marland Kitchen SIMBRINZA 1-0.2 % SUSP Place 2 drops daily at 12 noon into the right eye.   Marland Kitchen TAMSULOSIN HCL PO Take 0.4 mg by mouth daily.   Marland Kitchen amLODipine (NORVASC) 5 MG tablet Take 1 tablet (5 mg total) daily by mouth.   No facility-administered encounter medications on file as of 08/27/2017.     Review of Systems  Constitutional: Negative for appetite change and fever.  HENT: Negative for congestion and sinus pressure.   Respiratory: Negative for cough and chest tightness.        Previous breathing issues.  Better now.   Cardiovascular: Negative for chest pain, palpitations and leg swelling.  Gastrointestinal: Negative for abdominal pain, diarrhea, nausea and vomiting.  Genitourinary: Negative for difficulty urinating and dysuria.  Musculoskeletal:  Negative for back pain and joint swelling.  Skin: Negative for color change and rash.  Neurological: Negative for dizziness, light-headedness and headaches.  Psychiatric/Behavioral: Negative for agitation and dysphoric mood.       Objective:    Physical Exam  Constitutional: He appears well-developed and well-nourished. No distress.  HENT:  Nose: Nose normal.  Mouth/Throat: Oropharynx is clear and moist.  Neck: Neck supple. No thyromegaly present.  Cardiovascular: Normal rate and regular rhythm.  Pulmonary/Chest: Effort normal and breath sounds normal. No respiratory distress.  Abdominal: Soft. Bowel sounds are normal. There is no tenderness.  Musculoskeletal: He exhibits no edema or tenderness.  Lymphadenopathy:    He has no cervical  adenopathy.  Skin: No rash noted. No erythema.  Psychiatric: He has a normal mood and affect. His behavior is normal.    BP 136/80 (BP Location: Left Arm, Patient Position: Sitting, Cuff Size: Normal)   Pulse 64   Temp 97.7 F (36.5 C) (Oral)   Resp 16   Wt 158 lb 6.4 oz (71.8 kg)   SpO2 99%   BMI 22.73 kg/m  Wt Readings from Last 3 Encounters:  08/27/17 158 lb 6.4 oz (71.8 kg)  08/20/17 158 lb 1.9 oz (71.7 kg)  06/21/17 150 lb 12.8 oz (68.4 kg)     Lab Results  Component Value Date   WBC 4.4 08/21/2017   HGB 13.9 08/21/2017   HCT 42.6 08/21/2017   PLT 126 (L) 08/21/2017   GLUCOSE 96 08/21/2017   CHOL 180 12/27/2016   TRIG 52.0 12/27/2016   HDL 74.60 12/27/2016   LDLCALC 95 12/27/2016   ALT 18 08/21/2017   AST 33 08/21/2017   NA 138 08/21/2017   K 3.7 08/21/2017   CL 102 08/21/2017   CREATININE 1.21 08/21/2017   BUN 13 08/21/2017   CO2 28 08/21/2017   TSH 2.09 12/27/2016   PSA 2.75 08/11/2014    Dg Chest 2 View  Result Date: 08/21/2017 CLINICAL DATA:  76 year old male with shortness of breath. EXAM: CHEST  2 VIEW COMPARISON:  Chest CT dated 08/30/2015 FINDINGS: A 5 mm nodular appearing density superimposed over the anterior left fifth rib may represent a pulmonary nodule corresponding to the nodule seen on the prior CT (series 11, image 48) or nipple shadow. Repeat radiograph with placement of nipple markers may provide better evaluation. The lungs are clear. There is no pleural effusion or pneumothorax. The cardiac silhouette is within normal limits. No acute osseous pathology. IMPRESSION: 1. No acute cardiopulmonary process. 2. Probable 5 mm pulmonary nodule in the left lung. Electronically Signed   By: Anner Crete [name]   On: 08/21/2017 02:31       Assessment & Plan:   Problem List Items Addressed This Visit    Asthma    Had childhood asthma.  Recent concern regarding flare.  Neg - improved breathing.  Breathing back to baseline now.  Has albuterol  inhaler if needed.  Follow.        GERD (gastroesophageal reflux disease)    Omeprazole regularly.  Follow.        Hypertension    Blood pressure elevated as outlined.  Start amlodipine.  Follow pressures. Get him back in soon to reassess.        Relevant Medications   amLODipine (NORVASC) 5 MG tablet   Leukopenia    Recent white blood cell count wnl.        Lung nodule    Found on recent cxr.  Discussed  with him today.  Recheck cxr with nipple markers.  Further w/up pending results.        Relevant Orders   DG Chest 1 View (Completed)    Other Visit Diagnoses    Abnormal CXR    -  Primary   Relevant Orders   DG Chest 1 View (Completed)       Einar Pheasant, MD

## 2017-08-28 NOTE — Telephone Encounter (Signed)
Yes, I followed up per PCP request regarding his signs and symptoms after ED visit on 11/13.  Resolved in another note of no continued symptoms of SOB.  He reported he was doing ok/stable and awaiting work in. PCP was made aware.

## 2017-08-30 ENCOUNTER — Encounter: Payer: Self-pay | Admitting: Internal Medicine

## 2017-08-30 DIAGNOSIS — R911 Solitary pulmonary nodule: Secondary | ICD-10-CM | POA: Insufficient documentation

## 2017-08-30 DIAGNOSIS — J45909 Unspecified asthma, uncomplicated: Secondary | ICD-10-CM | POA: Insufficient documentation

## 2017-08-30 DIAGNOSIS — J45901 Unspecified asthma with (acute) exacerbation: Secondary | ICD-10-CM | POA: Insufficient documentation

## 2017-08-30 NOTE — Assessment & Plan Note (Signed)
Blood pressure elevated as outlined.  Start amlodipine.  Follow pressures. Get him back in soon to reassess.

## 2017-08-30 NOTE — Assessment & Plan Note (Signed)
Omeprazole regularly.  Follow.

## 2017-08-30 NOTE — Assessment & Plan Note (Signed)
Recent white blood cell count wnl.   

## 2017-08-30 NOTE — Assessment & Plan Note (Signed)
Had childhood asthma.  Recent concern regarding flare.  Neg - improved breathing.  Breathing back to baseline now.  Has albuterol inhaler if needed.  Follow.

## 2017-08-30 NOTE — Assessment & Plan Note (Signed)
Found on recent cxr.  Discussed with him today.  Recheck cxr with nipple markers.  Further w/up pending results.

## 2017-09-01 LAB — BLOOD GAS, VENOUS
Acid-Base Excess: 3.2 mmol/L — ABNORMAL HIGH (ref 0.0–2.0)
BICARBONATE: 29.9 mmol/L — AB (ref 20.0–28.0)
PATIENT TEMPERATURE: 37
PCO2 VEN: 53 mmHg (ref 44.0–60.0)
pH, Ven: 7.36 (ref 7.250–7.430)

## 2017-09-04 ENCOUNTER — Other Ambulatory Visit: Payer: Self-pay | Admitting: Internal Medicine

## 2017-09-04 ENCOUNTER — Telehealth: Payer: Self-pay | Admitting: Internal Medicine

## 2017-09-04 DIAGNOSIS — R911 Solitary pulmonary nodule: Secondary | ICD-10-CM

## 2017-09-04 DIAGNOSIS — R918 Other nonspecific abnormal finding of lung field: Secondary | ICD-10-CM

## 2017-09-04 NOTE — Telephone Encounter (Signed)
Called in for chest X-ray results.   Results given.  Pt made aware that CXR reveals persistent small nodule.  Recommend chest CT to better evaluate.  (could be scarring or previous infection.   Need CT to better evaluate).  If agreeable, let me know and I will place the order for the CT. He was agreeable to the CT scan so I routed a note to the nurse pool letting them.

## 2017-09-04 NOTE — Telephone Encounter (Signed)
Patient has agreed to have CT done

## 2017-09-04 NOTE — Telephone Encounter (Signed)
Order placed for ct chest.

## 2017-09-04 NOTE — Progress Notes (Signed)
Order placed for CT chest.  °

## 2017-09-06 DIAGNOSIS — K121 Other forms of stomatitis: Secondary | ICD-10-CM | POA: Diagnosis not present

## 2017-09-06 DIAGNOSIS — H6123 Impacted cerumen, bilateral: Secondary | ICD-10-CM | POA: Diagnosis not present

## 2017-09-12 ENCOUNTER — Ambulatory Visit
Admission: RE | Admit: 2017-09-12 | Discharge: 2017-09-12 | Disposition: A | Discharge status: Home / Self Care | Payer: Medicare Other | Source: Ambulatory Visit | Attending: Internal Medicine | Admitting: Internal Medicine

## 2017-09-12 DIAGNOSIS — I2510 Atherosclerotic heart disease of native coronary artery without angina pectoris: Secondary | ICD-10-CM | POA: Diagnosis not present

## 2017-09-12 DIAGNOSIS — R911 Solitary pulmonary nodule: Secondary | ICD-10-CM | POA: Diagnosis not present

## 2017-09-12 DIAGNOSIS — R918 Other nonspecific abnormal finding of lung field: Secondary | ICD-10-CM | POA: Insufficient documentation

## 2017-09-19 ENCOUNTER — Other Ambulatory Visit: Payer: Self-pay | Admitting: Internal Medicine

## 2017-09-19 ENCOUNTER — Ambulatory Visit: Payer: Medicare Other | Admitting: Internal Medicine

## 2017-09-19 DIAGNOSIS — E041 Nontoxic single thyroid nodule: Secondary | ICD-10-CM | POA: Diagnosis not present

## 2017-09-19 MED ORDER — AMLODIPINE BESYLATE 5 MG PO TABS: 5.0000 mg | ORAL_TABLET | Freq: Every day | ORAL | 1 refills | Status: DC

## 2017-09-20 ENCOUNTER — Ambulatory Visit: Payer: Medicare Other | Admitting: Internal Medicine

## 2017-09-26 DIAGNOSIS — E042 Nontoxic multinodular goiter: Secondary | ICD-10-CM | POA: Diagnosis not present

## 2017-10-10 ENCOUNTER — Encounter: Payer: Self-pay | Admitting: Internal Medicine

## 2017-10-10 ENCOUNTER — Ambulatory Visit (INDEPENDENT_AMBULATORY_CARE_PROVIDER_SITE_OTHER): Payer: Medicare Other | Admitting: Internal Medicine

## 2017-10-10 DIAGNOSIS — E041 Nontoxic single thyroid nodule: Secondary | ICD-10-CM | POA: Diagnosis not present

## 2017-10-10 DIAGNOSIS — K219 Gastro-esophageal reflux disease without esophagitis: Secondary | ICD-10-CM

## 2017-10-10 DIAGNOSIS — J4520 Mild intermittent asthma, uncomplicated: Secondary | ICD-10-CM | POA: Diagnosis not present

## 2017-10-10 DIAGNOSIS — E049 Nontoxic goiter, unspecified: Secondary | ICD-10-CM

## 2017-10-10 DIAGNOSIS — I10 Essential (primary) hypertension: Secondary | ICD-10-CM | POA: Diagnosis not present

## 2017-10-10 DIAGNOSIS — D72819 Decreased white blood cell count, unspecified: Secondary | ICD-10-CM

## 2017-10-10 DIAGNOSIS — R195 Other fecal abnormalities: Secondary | ICD-10-CM | POA: Diagnosis not present

## 2017-10-10 MED ORDER — MUPIROCIN 2 % EX OINT: TOPICAL_OINTMENT | CUTANEOUS | 0 refills | Status: DC

## 2017-10-10 NOTE — Progress Notes (Signed)
Patient ID: Jose Perry, male   DOB: January 16, 1941, 77 y.o.   MRN: [id number]   Subjective:    Patient ID: Jose Perry, male    DOB: [birth year], 76 y.o.   MRN: [id number]  HPI  Patient here for a scheduled follow up.  States he is doing relatively well.  Just evaluated by endocrinology.  Stable.  Recommended f/u ultrasound in one year.  Stays active.  No chest pain.  No sob.  No acid reflux.  No abdominal pain.  Does report noticing some intermittent soft stool.  Has been present for a while.  Has decreased the amount of almonds he eats.  Actually has stopped eating almonds.  Has helped some.  Still some issues.  No known triggers. No abdominal pain.  No blood.  Overall feels good.  Blood pressures on outside checks averaging 114-120s/70s.     Past Medical History:  Diagnosis Date  . Elevated blood pressure   . Enlarged prostate   . GERD (gastroesophageal reflux disease)   . Glaucoma   . Hx of colonic polyps   . Leukopenia    has had an extensive w/up.    . Thyroid goiter    Past Surgical History:  Procedure Laterality Date  . BACK SURGERY  2002   ruptured disc  . EYE SURGERY  2009   relieve pressure  . THYROID SURGERY  1968   goiter   Family History  Problem Relation Age of Onset  . Heart disease Mother   . Alcohol abuse Father   . Hyperlipidemia Father   . Stroke Father    Social History   Socioeconomic History  . Marital status: Married    Spouse name: None  . Number of children: None  . Years of education: None  . Highest education level: None  Social Needs  . Financial resource strain: None  . Food insecurity - worry: None  . Food insecurity - inability: None  . Transportation needs - medical: None  . Transportation needs - non-medical: None  Occupational History  . None  Tobacco Use  . Smoking status: Never Smoker  . Smokeless tobacco: Never Used  Substance and Sexual Activity  . Alcohol use: No    Alcohol/week: 0.0 oz  . Drug use: No  . Sexual  activity: None  Other Topics Concern  . None  Social History Narrative  . None    Outpatient Encounter Medications as of 10/10/2017  Medication Sig  . albuterol (PROVENTIL HFA;VENTOLIN HFA) 108 (90 Base) MCG/ACT inhaler Inhale 2 puffs every 6 (six) hours as needed into the lungs.  Marland Kitchen amLODipine (NORVASC) 5 MG tablet Take 1 tablet (5 mg total) by mouth daily.  . bimatoprost (LUMIGAN) 0.03 % ophthalmic solution Place 1 drop into the right eye at bedtime.   Marland Kitchen omeprazole (PRILOSEC) 40 MG capsule Take 40 mg daily by mouth.   Marland Kitchen SIMBRINZA 1-0.2 % SUSP Place 2 drops daily at 12 noon into the right eye.   Marland Kitchen TAMSULOSIN HCL PO Take 0.4 mg by mouth daily.   . mupirocin ointment (BACTROBAN) 2 % Apply to affected area on back bid for 7-10 days.   No facility-administered encounter medications on file as of 10/10/2017.     Review of Systems  Constitutional: Negative for appetite change and unexpected weight change.  HENT: Negative for congestion and sinus pressure.   Respiratory: Negative for cough, chest tightness and shortness of breath.   Cardiovascular: Negative for chest pain, palpitations and leg swelling.  Gastrointestinal: Negative for abdominal pain, constipation, nausea and vomiting.       Soft stool as outlined.    Genitourinary: Negative for difficulty urinating and dysuria.  Musculoskeletal: Negative for joint swelling and myalgias.  Skin: Negative for color change and rash.  Neurological: Negative for dizziness and headaches.  Psychiatric/Behavioral: Negative for agitation and dysphoric mood.       Objective:    Physical Exam  Constitutional: He appears well-developed and well-nourished. No distress.  HENT:  Nose: Nose normal.  Mouth/Throat: Oropharynx is clear and moist.  Neck: Neck supple.  Cardiovascular: Normal rate and regular rhythm.  Pulmonary/Chest: Effort normal and breath sounds normal. No respiratory distress.  Abdominal: Soft. Bowel sounds are normal. There is no  tenderness.  Musculoskeletal: He exhibits no edema or tenderness.  Lymphadenopathy:    He has no cervical adenopathy.  Skin: No rash noted. No erythema.  Psychiatric: He has a normal mood and affect. His behavior is normal.    BP 124/76 (BP Location: Left Arm, Patient Position: Sitting, Cuff Size: Normal)   Pulse (!) 57   Temp (!) 97.4 F (36.3 C) (Oral)   Resp 16   Wt 158 lb 12.8 oz (72 kg)   SpO2 99%   BMI 22.79 kg/m  Wt Readings from Last 3 Encounters:  10/10/17 158 lb 12.8 oz (72 kg)  08/27/17 158 lb 6.4 oz (71.8 kg)  08/20/17 158 lb 1.9 oz (71.7 kg)     Lab Results  Component Value Date   WBC 4.4 08/21/2017   HGB 13.9 08/21/2017   HCT 42.6 08/21/2017   PLT 126 (L) 08/21/2017   GLUCOSE 96 08/21/2017   CHOL 180 12/27/2016   TRIG 52.0 12/27/2016   HDL 74.60 12/27/2016   LDLCALC 95 12/27/2016   ALT 18 08/21/2017   AST 33 08/21/2017   NA 138 08/21/2017   K 3.7 08/21/2017   CL 102 08/21/2017   CREATININE 1.21 08/21/2017   BUN 13 08/21/2017   CO2 28 08/21/2017   TSH 2.09 12/27/2016   PSA 2.75 08/11/2014    Ct Chest Wo Contrast  Result Date: 09/12/2017 CLINICAL DATA:  Lung nodule. EXAM: CT CHEST WITHOUT CONTRAST TECHNIQUE: Multidetector CT imaging of the chest was performed following the standard protocol without IV contrast. COMPARISON:  Radiograph of August 27, 2017. CT scan of August 30, 2015. FINDINGS: Cardiovascular: Coronary artery calcifications are noted. There is no evidence of thoracic aortic aneurysm. Normal cardiac size. No pericardial effusion. Mediastinum/Nodes: Enlarged left thyroid gland is again noted concerning for possible nodule. Thyroid ultrasound is recommended. Esophagus is unremarkable. No adenopathy is noted. Lungs/Pleura: Lungs are clear. No pleural effusion or pneumothorax. Upper Abdomen: No acute abnormality. Musculoskeletal: No chest wall mass or suspicious bone lesions identified. IMPRESSION: No pulmonary nodule is noted. Coronary artery  calcifications are noted which may suggest coronary artery disease. Enlarged left thyroid gland is again noted. Thyroid ultrasound is recommended to evaluate for underlying nodule or mass. Electronically Signed   By: Marijo Conception, [name]   On: 09/12/2017 13:58       Assessment & Plan:   Problem List Items Addressed This Visit    Asthma    Has a h/o childhood asthma.  Recent ER evaluation as outlined previously.  Breathing doing well.  No sob.  Overall feels good.       Change in stool    Soft stool as outlined.  No pain.  Some improvement with decreased almond intake.  Will monitor for  possible triggers. No pain or other GI symptoms.  Start probiotic.  Follow closely.  Notify me if symptoms persists.        GERD (gastroesophageal reflux disease)    Controlled on current regimen.        Goiter    Previous CT with enlarged left thyroid lobe and what appeared to be nodule present.  Recently evaluated by endocrinology.  Stable.  Recommended f/u in one year.        Hypertension    Blood pressure under good control.  Continue same medication regimen.  Follow pressures.  Follow metabolic panel.        Leukopenia    White blood cell count 08/2017 wnl.        Thyroid nodule    Evaluated by endocrinology.  S/p biopsy previously.  Recently evaluated.  Stable.  Recommended f/u in one year.            Einar Pheasant, MD

## 2017-10-10 NOTE — Patient Instructions (Signed)
Examples of probiotics:  Culturelle, florastor or align.   

## 2017-10-12 ENCOUNTER — Encounter: Payer: Self-pay | Admitting: Internal Medicine

## 2017-10-12 DIAGNOSIS — R195 Other fecal abnormalities: Secondary | ICD-10-CM | POA: Insufficient documentation

## 2017-10-12 NOTE — Assessment & Plan Note (Signed)
Controlled on current regimen.   

## 2017-10-12 NOTE — Assessment & Plan Note (Signed)
Soft stool as outlined.  No pain.  Some improvement with decreased almond intake.  Will monitor for possible triggers. No pain or other GI symptoms.  Start probiotic.  Follow closely.  Notify me if symptoms persists.

## 2017-10-12 NOTE — Assessment & Plan Note (Signed)
Has a h/o childhood asthma.  Recent ER evaluation as outlined previously.  Breathing doing well.  No sob.  Overall feels good.

## 2017-10-12 NOTE — Assessment & Plan Note (Signed)
Previous CT with enlarged left thyroid lobe and what appeared to be nodule present.  Recently evaluated by endocrinology.  Stable.  Recommended f/u in one year.

## 2017-10-12 NOTE — Assessment & Plan Note (Signed)
Blood pressure under good control.  Continue same medication regimen.  Follow pressures.  Follow metabolic panel.   

## 2017-10-12 NOTE — Assessment & Plan Note (Signed)
White blood cell count 08/2017 wnl.

## 2017-10-12 NOTE — Assessment & Plan Note (Signed)
Evaluated by endocrinology.  S/p biopsy previously.  Recently evaluated.  Stable.  Recommended f/u in one year.

## 2017-11-14 DIAGNOSIS — J305 Allergic rhinitis due to food: Secondary | ICD-10-CM | POA: Diagnosis not present

## 2017-11-14 DIAGNOSIS — K121 Other forms of stomatitis: Secondary | ICD-10-CM | POA: Diagnosis not present

## 2017-11-14 DIAGNOSIS — J301 Allergic rhinitis due to pollen: Secondary | ICD-10-CM | POA: Diagnosis not present

## 2017-11-19 DIAGNOSIS — H401123 Primary open-angle glaucoma, left eye, severe stage: Secondary | ICD-10-CM | POA: Diagnosis not present

## 2017-12-13 DIAGNOSIS — K121 Other forms of stomatitis: Secondary | ICD-10-CM | POA: Diagnosis not present

## 2017-12-13 DIAGNOSIS — J301 Allergic rhinitis due to pollen: Secondary | ICD-10-CM | POA: Diagnosis not present

## 2017-12-19 ENCOUNTER — Encounter: Payer: Self-pay | Admitting: Internal Medicine

## 2017-12-19 ENCOUNTER — Ambulatory Visit (INDEPENDENT_AMBULATORY_CARE_PROVIDER_SITE_OTHER): Payer: Medicare Other | Admitting: Internal Medicine

## 2017-12-19 VITALS — BP 130/78 | HR 56 | Temp 97.5°F | Resp 20 | Wt 152.8 lb

## 2017-12-19 DIAGNOSIS — D72819 Decreased white blood cell count, unspecified: Secondary | ICD-10-CM

## 2017-12-19 DIAGNOSIS — N400 Enlarged prostate without lower urinary tract symptoms: Secondary | ICD-10-CM

## 2017-12-19 DIAGNOSIS — E041 Nontoxic single thyroid nodule: Secondary | ICD-10-CM | POA: Diagnosis not present

## 2017-12-19 DIAGNOSIS — J4520 Mild intermittent asthma, uncomplicated: Secondary | ICD-10-CM | POA: Diagnosis not present

## 2017-12-19 DIAGNOSIS — D696 Thrombocytopenia, unspecified: Secondary | ICD-10-CM | POA: Diagnosis not present

## 2017-12-19 DIAGNOSIS — K219 Gastro-esophageal reflux disease without esophagitis: Secondary | ICD-10-CM

## 2017-12-19 DIAGNOSIS — I10 Essential (primary) hypertension: Secondary | ICD-10-CM | POA: Diagnosis not present

## 2017-12-19 NOTE — Progress Notes (Signed)
Patient ID: Jose Perry, male   DOB: 1940/12/20, 77 y.o.   MRN: [id number]   Subjective:    Patient ID: Jose Perry, male    DOB: [birth year], 77 y.o.   MRN: [id number]  HPI  Patient here for a scheduled follow up.  He reports he is doing well.  Stays active.  Exercising.  No chest pain.  No sob.  No acid reflux.  No abdominal pain.  Bowels moving.  No bowel issues now.  No urine change.  Weight has decreased.  Does watch what he eats.  Sees Dr Jacqlyn Larsen.     Past Medical History:  Diagnosis Date  . Elevated blood pressure   . Enlarged prostate   . GERD (gastroesophageal reflux disease)   . Glaucoma   . Hx of colonic polyps   . Leukopenia    has had an extensive w/up.    . Thyroid goiter    Past Surgical History:  Procedure Laterality Date  . BACK SURGERY  2002   ruptured disc  . EYE SURGERY  2009   relieve pressure  . THYROID SURGERY  1968   goiter   Family History  Problem Relation Age of Onset  . Heart disease Mother   . Alcohol abuse Father   . Hyperlipidemia Father   . Stroke Father    Social History   Socioeconomic History  . Marital status: Married    Spouse name: None  . Number of children: None  . Years of education: None  . Highest education level: None  Social Needs  . Financial resource strain: None  . Food insecurity - worry: None  . Food insecurity - inability: None  . Transportation needs - medical: None  . Transportation needs - non-medical: None  Occupational History  . None  Tobacco Use  . Smoking status: Never Smoker  . Smokeless tobacco: Never Used  Substance and Sexual Activity  . Alcohol use: No    Alcohol/week: 0.0 oz  . Drug use: No  . Sexual activity: None  Other Topics Concern  . None  Social History Narrative  . None    Outpatient Encounter Medications as of 12/19/2017  Medication Sig  . albuterol (PROVENTIL HFA;VENTOLIN HFA) 108 (90 Base) MCG/ACT inhaler Inhale 2 puffs every 6 (six) hours as needed into the lungs.  Marland Kitchen  amLODipine (NORVASC) 5 MG tablet Take 1 tablet (5 mg total) by mouth daily.  . bimatoprost (LUMIGAN) 0.03 % ophthalmic solution Place 1 drop into the right eye at bedtime.   . mupirocin ointment (BACTROBAN) 2 % Apply to affected area on back bid for 7-10 days.  Marland Kitchen omeprazole (PRILOSEC) 40 MG capsule Take 40 mg daily by mouth.   Marland Kitchen SIMBRINZA 1-0.2 % SUSP Place 2 drops daily at 12 noon into the right eye.   Marland Kitchen TAMSULOSIN HCL PO Take 0.4 mg by mouth daily.    No facility-administered encounter medications on file as of 12/19/2017.     Review of Systems  Constitutional: Negative for appetite change and unexpected weight change.  HENT: Negative for congestion and sinus pressure.   Respiratory: Negative for cough, chest tightness and shortness of breath.   Cardiovascular: Negative for chest pain, palpitations and leg swelling.  Gastrointestinal: Negative for abdominal pain, diarrhea, nausea and vomiting.  Genitourinary: Negative for difficulty urinating and dysuria.  Musculoskeletal: Negative for joint swelling and myalgias.  Skin: Negative for color change and rash.  Neurological: Negative for dizziness, light-headedness and headaches.  Psychiatric/Behavioral: Negative for  agitation and dysphoric mood.       Objective:    Physical Exam  Constitutional: He appears well-developed and well-nourished. No distress.  HENT:  Nose: Nose normal.  Mouth/Throat: Oropharynx is clear and moist.  Neck: Neck supple. No thyromegaly present.  Cardiovascular: Normal rate and regular rhythm.  Pulmonary/Chest: Effort normal and breath sounds normal. No respiratory distress.  Abdominal: Soft. Bowel sounds are normal. There is no tenderness.  Musculoskeletal: He exhibits no edema or tenderness.  Lymphadenopathy:    He has no cervical adenopathy.  Skin: No rash noted. No erythema.  Psychiatric: He has a normal mood and affect. His behavior is normal.    BP 130/78 (BP Location: Left Arm, Patient Position:  Sitting, Cuff Size: Normal)   Pulse (!) 56   Temp (!) 97.5 F (36.4 C) (Oral)   Resp 20   Wt 152 lb 12.8 oz (69.3 kg)   SpO2 98%   BMI 21.92 kg/m  Wt Readings from Last 3 Encounters:  12/19/17 152 lb 12.8 oz (69.3 kg)  10/10/17 158 lb 12.8 oz (72 kg)  08/27/17 158 lb 6.4 oz (71.8 kg)     Lab Results  Component Value Date   WBC 4.4 08/21/2017   HGB 13.9 08/21/2017   HCT 42.6 08/21/2017   PLT 126 (L) 08/21/2017   GLUCOSE 96 08/21/2017   CHOL 180 12/27/2016   TRIG 52.0 12/27/2016   HDL 74.60 12/27/2016   LDLCALC 95 12/27/2016   ALT 18 08/21/2017   AST 33 08/21/2017   NA 138 08/21/2017   K 3.7 08/21/2017   CL 102 08/21/2017   CREATININE 1.21 08/21/2017   BUN 13 08/21/2017   CO2 28 08/21/2017   TSH 2.09 12/27/2016   PSA 2.75 08/11/2014    Ct Chest Wo Contrast  Result Date: 09/12/2017 CLINICAL DATA:  Lung nodule. EXAM: CT CHEST WITHOUT CONTRAST TECHNIQUE: Multidetector CT imaging of the chest was performed following the standard protocol without IV contrast. COMPARISON:  Radiograph of August 27, 2017. CT scan of August 30, 2015. FINDINGS: Cardiovascular: Coronary artery calcifications are noted. There is no evidence of thoracic aortic aneurysm. Normal cardiac size. No pericardial effusion. Mediastinum/Nodes: Enlarged left thyroid gland is again noted concerning for possible nodule. Thyroid ultrasound is recommended. Esophagus is unremarkable. No adenopathy is noted. Lungs/Pleura: Lungs are clear. No pleural effusion or pneumothorax. Upper Abdomen: No acute abnormality. Musculoskeletal: No chest wall mass or suspicious bone lesions identified. IMPRESSION: No pulmonary nodule is noted. Coronary artery calcifications are noted which may suggest coronary artery disease. Enlarged left thyroid gland is again noted. Thyroid ultrasound is recommended to evaluate for underlying nodule or mass. Electronically Signed   By: Marijo Conception, [name]   On: 09/12/2017 13:58       Assessment  & Plan:   Problem List Items Addressed This Visit    Asthma    Stable.  No breathing issues.        Enlarged prostate    Followed by urology - Dr Jacqlyn Larsen.       GERD (gastroesophageal reflux disease) - Primary    Controlled on current regimen.  Follow.       Hypertension    Blood pressure under good control.  Continue same medication regimen.  Follow pressures.  Follow metabolic panel.        Relevant Orders   Hepatic function panel   Lipid panel   TSH   Basic metabolic panel   Leukopenia    Follow cbc.  Thrombocytopenia ([place])    Follow cbc.       Relevant Orders   CBC with Differential/Platelet   Thyroid nodule    Evaluated by endocrinology.  S/p biopsy.  Recommended f/u in one year.            Einar Pheasant, MD

## 2017-12-22 ENCOUNTER — Encounter: Payer: Self-pay | Admitting: Internal Medicine

## 2017-12-22 NOTE — Assessment & Plan Note (Signed)
Blood pressure under good control.  Continue same medication regimen.  Follow pressures.  Follow metabolic panel.   

## 2017-12-22 NOTE — Assessment & Plan Note (Signed)
Controlled on current regimen.  Follow.  

## 2017-12-22 NOTE — Assessment & Plan Note (Signed)
Evaluated by endocrinology.  S/p biopsy.  Recommended f/u in one year.

## 2017-12-22 NOTE — Assessment & Plan Note (Signed)
Followed by urology - Dr Jacqlyn Larsen.

## 2017-12-22 NOTE — Assessment & Plan Note (Addendum)
Stable.  No breathing issues.

## 2017-12-22 NOTE — Assessment & Plan Note (Signed)
Follow cbc.  

## 2018-01-22 DIAGNOSIS — B9681 Helicobacter pylori [H. pylori] as the cause of diseases classified elsewhere: Secondary | ICD-10-CM | POA: Diagnosis not present

## 2018-01-22 DIAGNOSIS — K289 Gastrojejunal ulcer, unspecified as acute or chronic, without hemorrhage or perforation: Secondary | ICD-10-CM | POA: Diagnosis not present

## 2018-01-22 DIAGNOSIS — Z86010 Personal history of colonic polyps: Secondary | ICD-10-CM | POA: Diagnosis not present

## 2018-01-22 DIAGNOSIS — R634 Abnormal weight loss: Secondary | ICD-10-CM | POA: Diagnosis not present

## 2018-01-22 DIAGNOSIS — D1803 Hemangioma of intra-abdominal structures: Secondary | ICD-10-CM | POA: Diagnosis not present

## 2018-01-22 DIAGNOSIS — K210 Gastro-esophageal reflux disease with esophagitis: Secondary | ICD-10-CM | POA: Diagnosis not present

## 2018-01-24 DIAGNOSIS — D1803 Hemangioma of intra-abdominal structures: Secondary | ICD-10-CM | POA: Insufficient documentation

## 2018-01-24 HISTORY — DX: Hemangioma of intra-abdominal structures: D18.03

## 2018-02-01 ENCOUNTER — Ambulatory Visit
Admission: RE | Admit: 2018-02-01 | Payer: Medicare Other | Source: Ambulatory Visit | Admitting: Unknown Physician Specialty

## 2018-02-01 ENCOUNTER — Encounter: Payer: Self-pay | Admitting: *Deleted

## 2018-02-01 ENCOUNTER — Ambulatory Visit
Admission: RE | Admit: 2018-02-01 | Discharge: 2018-02-01 | Disposition: A | Discharge status: Home / Self Care | Payer: Medicare Other | Source: Ambulatory Visit | Attending: Unknown Physician Specialty | Admitting: Unknown Physician Specialty

## 2018-02-01 ENCOUNTER — Ambulatory Visit: Payer: Medicare Other | Admitting: Anesthesiology

## 2018-02-01 ENCOUNTER — Encounter
Admission: RE | Disposition: A | Discharge status: Home / Self Care | Payer: Self-pay | Source: Ambulatory Visit | Attending: Unknown Physician Specialty

## 2018-02-01 ENCOUNTER — Encounter: Admission: RE | Payer: Self-pay | Source: Ambulatory Visit

## 2018-02-01 DIAGNOSIS — Z86010 Personal history of colonic polyps: Secondary | ICD-10-CM | POA: Insufficient documentation

## 2018-02-01 DIAGNOSIS — D122 Benign neoplasm of ascending colon: Secondary | ICD-10-CM | POA: Insufficient documentation

## 2018-02-01 DIAGNOSIS — K635 Polyp of colon: Secondary | ICD-10-CM | POA: Diagnosis not present

## 2018-02-01 DIAGNOSIS — I10 Essential (primary) hypertension: Secondary | ICD-10-CM | POA: Diagnosis not present

## 2018-02-01 DIAGNOSIS — Z1211 Encounter for screening for malignant neoplasm of colon: Secondary | ICD-10-CM | POA: Diagnosis not present

## 2018-02-01 DIAGNOSIS — K640 First degree hemorrhoids: Secondary | ICD-10-CM | POA: Insufficient documentation

## 2018-02-01 DIAGNOSIS — Z79899 Other long term (current) drug therapy: Secondary | ICD-10-CM | POA: Diagnosis not present

## 2018-02-01 DIAGNOSIS — N400 Enlarged prostate without lower urinary tract symptoms: Secondary | ICD-10-CM | POA: Diagnosis not present

## 2018-02-01 DIAGNOSIS — K219 Gastro-esophageal reflux disease without esophagitis: Secondary | ICD-10-CM | POA: Insufficient documentation

## 2018-02-01 DIAGNOSIS — D126 Benign neoplasm of colon, unspecified: Secondary | ICD-10-CM | POA: Diagnosis not present

## 2018-02-01 DIAGNOSIS — D123 Benign neoplasm of transverse colon: Secondary | ICD-10-CM | POA: Diagnosis not present

## 2018-02-01 DIAGNOSIS — K648 Other hemorrhoids: Secondary | ICD-10-CM | POA: Diagnosis not present

## 2018-02-01 HISTORY — DX: Personal history of adenomatous and serrated colon polyps: Z86.0101

## 2018-02-01 HISTORY — PX: COLONOSCOPY WITH PROPOFOL: SHX5780

## 2018-02-01 HISTORY — DX: Essential (primary) hypertension: I10

## 2018-02-01 HISTORY — DX: Personal history of colonic polyps: Z86.010

## 2018-02-01 HISTORY — DX: Unspecified asthma, uncomplicated: J45.909

## 2018-02-01 HISTORY — DX: Hemangioma of intra-abdominal structures: D18.03

## 2018-02-01 HISTORY — DX: Dyspnea, unspecified: R06.00

## 2018-02-01 LAB — HM COLONOSCOPY

## 2018-02-01 SURGERY — COLONOSCOPY WITH PROPOFOL
Anesthesia: General

## 2018-02-01 MED ORDER — PROPOFOL 500 MG/50ML IV EMUL
INTRAVENOUS | Status: DC | PRN
  Administered 2018-02-01: 75 ug/kg/min via INTRAVENOUS

## 2018-02-01 MED ORDER — GLYCOPYRROLATE 0.2 MG/ML IJ SOLN
INTRAMUSCULAR | Status: DC | PRN
  Administered 2018-02-01: 0.2 mg via INTRAVENOUS

## 2018-02-01 MED ORDER — SODIUM CHLORIDE 0.9 % IV SOLN
INTRAVENOUS | Status: DC
  Administered 2018-02-01: 1000 mL via INTRAVENOUS

## 2018-02-01 MED ORDER — PROPOFOL 10 MG/ML IV BOLUS
INTRAVENOUS | Status: DC | PRN
  Administered 2018-02-01: 50 mg via INTRAVENOUS

## 2018-02-01 MED ORDER — SODIUM CHLORIDE 0.9 % IV SOLN: INTRAVENOUS | Status: DC

## 2018-02-01 NOTE — H&P (Signed)
Primary Care Physician:  Einar Pheasant, MD Primary Gastroenterologist:  Dr. Vira Agar  Pre-Procedure History & Physical: HPI:  Jose Perry is a 77 y.o. male is here for an colonoscopy. For [place] colon polyps.   Past Medical History:  Diagnosis Date  . Asthma   . Dyspnea   . Elevated blood pressure   . Enlarged prostate   . GERD (gastroesophageal reflux disease)   . Glaucoma   . History of adenomatous polyp of colon   . Hx of colonic polyps   . Hypertension   . Leukopenia    has had an extensive w/up.    . Liver hemangioma 01/24/2018  . Thyroid goiter     Past Surgical History:  Procedure Laterality Date  . BACK SURGERY  2002   ruptured disc  . COLONOSCOPY W/ POLYPECTOMY  04/24/2005, 07/06/2010, 07/08/2014  . ESOPHAGOGASTRODUODENOSCOPY  04/24/2005, 07/06/2010,  . EYE SURGERY  2009   relieve pressure from glaucoma  . THYROID SURGERY  1968   goiter    Prior to Admission medications   Medication Sig Start Date End Date Taking? Authorizing Provider  SIMBRINZA 1-0.2 % SUSP Place 2 drops daily at 12 noon into the right eye.  06/10/16  Yes [provider]  albuterol (PROVENTIL HFA;VENTOLIN HFA) 108 (90 Base) MCG/ACT inhaler Inhale 2 puffs every 6 (six) hours as needed into the lungs. 08/21/17   Loney Hering, MD  amLODipine (NORVASC) 5 MG tablet Take 1 tablet (5 mg total) by mouth daily. 09/19/17   Einar Pheasant, MD  bimatoprost (LUMIGAN) 0.03 % ophthalmic solution Place 1 drop into the right eye at bedtime.     [provider]  mupirocin ointment (BACTROBAN) 2 % Apply to affected area on back bid for 7-10 days. 10/10/17   Einar Pheasant, MD  omeprazole (PRILOSEC) 40 MG capsule Take 40 mg daily by mouth.  06/10/17   [provider]  TAMSULOSIN HCL PO Take 0.4 mg by mouth daily.     [provider]    Allergies as of 01/30/2018  . (No Known Allergies)    Family History  Problem Relation Age of Onset  . Heart disease Mother   . Alcohol  abuse Father   . Hyperlipidemia Father   . Stroke Father     Social History   Socioeconomic History  . Marital status: Married    Spouse name: Not on file  . Number of children: Not on file  . Years of education: Not on file  . Highest education level: Not on file  Occupational History  . Not on file  Social Needs  . Financial resource strain: Not on file  . Food insecurity:    Worry: Not on file    Inability: Not on file  . Transportation needs:    Medical: Not on file    Non-medical: Not on file  Tobacco Use  . Smoking status: Never Smoker  . Smokeless tobacco: Never Used  Substance and Sexual Activity  . Alcohol use: No    Alcohol/week: 0.0 oz  . Drug use: No  . Sexual activity: Not on file  Lifestyle  . Physical activity:    Days per week: Not on file    Minutes per session: Not on file  . Stress: Not on file  Relationships  . Social connections:    Talks on phone: Not on file    Gets together: Not on file    Attends religious service: Not on file  Active member of club or organization: Not on file    Attends meetings of clubs or organizations: Not on file    Relationship status: Not on file  . Intimate partner violence:    Fear of current or ex partner: Not on file    Emotionally abused: Not on file    Physically abused: Not on file    Forced sexual activity: Not on file  Other Topics Concern  . Not on file  Social History Narrative  . Not on file    Review of Systems: See HPI, otherwise negative ROS  Physical Exam: BP (!) 148/93   Pulse (!) 48   Temp (!) 97.4 F (36.3 C) (Tympanic)   Resp 18   Ht 5\' 10"  (1.778 m)   Wt 70.8 kg (156 lb)   SpO2 100%   BMI 22.38 kg/m  General:   Alert,  pleasant and cooperative in NAD Head:  Normocephalic and atraumatic. Neck:  Supple; no masses or thyromegaly. Lungs:  Clear throughout to auscultation.    Heart:  Regular rate and rhythm. Abdomen:  Soft, nontender and nondistended. Normal bowel sounds,  without guarding, and without rebound.   Neurologic:  Alert and  oriented x4;  grossly normal neurologically.  Impression/Plan: LUNDON VERDEJO is here for an colonoscopy to be performed for personal history of colon polyps.  Risks, benefits, limitations, and alternatives regarding  colonoscopy have been reviewed with the patient.  Questions have been answered.  All parties agreeable.   Gaylyn Cheers, MD  02/01/2018, 11:09 AM

## 2018-02-01 NOTE — Transfer of Care (Signed)
Immediate Anesthesia Transfer of Care Note  Patient: Jose Perry  Procedure(s) Performed: COLONOSCOPY WITH PROPOFOL (N/A )  Patient Location: PACU and Endoscopy Unit  Anesthesia Type:General  Level of Consciousness: awake, alert  and oriented  Airway & Oxygen Therapy: Patient Spontanous Breathing and Patient connected to nasal cannula oxygen  Post-op Assessment: Report given to RN and Post -op Vital signs reviewed and stable  Post vital signs: Reviewed and stable  Last Vitals:  Vitals Value Taken Time  BP 94/62 02/01/2018 11:39 AM  Temp    Pulse 48 02/01/2018 11:39 AM  Resp 15 02/01/2018 11:39 AM  SpO2 100 % 02/01/2018 11:39 AM  Vitals shown include unvalidated device data.  Last Pain:  Vitals:   02/01/18 1043  TempSrc: Tympanic  PainSc: 0-No pain         Complications: No apparent anesthesia complications

## 2018-02-01 NOTE — Anesthesia Postprocedure Evaluation (Signed)
Anesthesia Post Note  Patient: Jose Perry  Procedure(s) Performed: COLONOSCOPY WITH PROPOFOL (N/A )  Patient location during evaluation: PACU Anesthesia Type: General Level of consciousness: awake and alert Pain management: pain level controlled Vital Signs Assessment: post-procedure vital signs reviewed and stable Respiratory status: spontaneous breathing, nonlabored ventilation, respiratory function stable and patient connected to nasal cannula oxygen Cardiovascular status: blood pressure returned to baseline and stable Postop Assessment: no apparent nausea or vomiting Anesthetic complications: no     Last Vitals:  Vitals:   02/01/18 1139 02/01/18 1149  BP: 94/62 112/64  Pulse: (!) 48 (!) 46  Resp: 15 13  Temp: (!) 36 C   SpO2: 100% 100%    Last Pain:  Vitals:   02/01/18 1149  TempSrc:   PainSc: 0-No pain                 Molli Barrows

## 2018-02-01 NOTE — Op Note (Signed)
[facility] Gastroenterology Patient Name: Jose Perry Procedure Date: 02/01/2018 10:59 AM MRN: [id number] Account #: [id number] Date of Birth: Mar 16, 1941 Admit Type: Outpatient Age: 77 Room: [facility] ENDO ROOM 2 Gender: Male Note Status: Finalized Procedure:            Colonoscopy Indications:          High risk colon cancer surveillance: Personal history                        of colonic polyps Providers:            Manya Silvas, MD Referring MD:         Einar Pheasant, MD (Referring MD) Medicines:            Propofol per Anesthesia Complications:        No immediate complications. Procedure:            Pre-Anesthesia Assessment:                       - After reviewing the risks and benefits, the patient                        was deemed in satisfactory condition to undergo the                        procedure.                       After obtaining informed consent, the colonoscope was                        passed under direct vision. Throughout the procedure,                        the patient's blood pressure, pulse, and oxygen                        saturations were monitored continuously. The                        Colonoscope was introduced through the anus and                        advanced to the the cecum, identified by appendiceal                        orifice and ileocecal valve. The colonoscopy was                        performed without difficulty. The patient tolerated the                        procedure well. The quality of the bowel preparation                        was excellent. Findings:      Two sessile polyps were found in the transverse colon and ascending       colon. The polyps were diminutive in size. These polyps were removed       with a jumbo cold forceps. Resection and retrieval were complete.  Internal hemorrhoids were found during endoscopy. The hemorrhoids were       small, medium-sized and Grade I (internal  hemorrhoids that do not       prolapse). Impression:           - Two diminutive polyps in the transverse colon and in                        the ascending colon, removed with a jumbo cold forceps.                        Resected and retrieved.                       - Internal hemorrhoids. Recommendation:       - Await pathology results. Manya Silvas, MD 02/01/2018 11:39:07 AM This report has been signed electronically. Number of Addenda: 0 Note Initiated On: 02/01/2018 10:59 AM Scope Withdrawal Time: 0 hours 10 minutes 59 seconds  Total Procedure Duration: 0 hours 16 minutes 56 seconds       [facility]

## 2018-02-01 NOTE — Anesthesia Preprocedure Evaluation (Signed)
Anesthesia Evaluation  Patient identified by MRN, date of birth, ID band Patient awake    Reviewed: Allergy & Precautions, H&P , NPO status , Patient's Chart, lab work & pertinent test results, reviewed documented beta blocker date and time   Airway Mallampati: II   Neck ROM: full    Dental  (+) Poor Dentition   Pulmonary neg pulmonary ROS, shortness of breath, asthma ,    Pulmonary exam normal        Cardiovascular Exercise Tolerance: Good hypertension, On Medications negative cardio ROS Normal cardiovascular exam Rhythm:regular Rate:Normal     Neuro/Psych negative neurological ROS  negative psych ROS   GI/Hepatic negative GI ROS, Neg liver ROS, GERD  ,  Endo/Other  negative endocrine ROS  Renal/GU negative Renal ROS  negative genitourinary   Musculoskeletal   Abdominal   Peds  Hematology negative hematology ROS (+)   Anesthesia Other Findings Past Medical History: No date: Asthma No date: Dyspnea No date: Elevated blood pressure No date: Enlarged prostate No date: GERD (gastroesophageal reflux disease) No date: Glaucoma No date: History of adenomatous polyp of colon No date: Hx of colonic polyps No date: Hypertension No date: Leukopenia     Comment:  has had an extensive w/up.   01/24/2018: Liver hemangioma No date: Thyroid goiter Past Surgical History: 2002: BACK SURGERY     Comment:  ruptured disc 04/24/2005, 07/06/2010, 07/08/2014: COLONOSCOPY W/ POLYPECTOMY 04/24/2005, 07/06/2010,: ESOPHAGOGASTRODUODENOSCOPY 2009: EYE SURGERY     Comment:  relieve pressure from glaucoma 1968: THYROID SURGERY     Comment:  goiter BMI    Body Mass Index:  22.38 kg/m     Reproductive/Obstetrics negative OB ROS                             Anesthesia Physical Anesthesia Plan  ASA: II  Anesthesia Plan: General   Post-op Pain Management:    Induction:   PONV Risk Score and Plan:    Airway Management Planned:   Additional Equipment:   Intra-op Plan:   Post-operative Plan:   Informed Consent: I have reviewed the patients History and Physical, chart, labs and discussed the procedure including the risks, benefits and alternatives for the proposed anesthesia with the patient or authorized representative who has indicated his/her understanding and acceptance.   Dental Advisory Given  Plan Discussed with: CRNA  Anesthesia Plan Comments:         Anesthesia Quick Evaluation

## 2018-02-01 NOTE — Anesthesia Post-op Follow-up Note (Signed)
Anesthesia QCDR form completed.        

## 2018-02-04 ENCOUNTER — Encounter: Payer: Self-pay | Admitting: Unknown Physician Specialty

## 2018-02-04 LAB — SURGICAL PATHOLOGY

## 2018-02-26 ENCOUNTER — Other Ambulatory Visit: Payer: Self-pay

## 2018-02-26 ENCOUNTER — Encounter: Payer: Self-pay | Admitting: Family Medicine

## 2018-02-26 ENCOUNTER — Ambulatory Visit (INDEPENDENT_AMBULATORY_CARE_PROVIDER_SITE_OTHER): Payer: Medicare Other | Admitting: Family Medicine

## 2018-02-26 DIAGNOSIS — J4520 Mild intermittent asthma, uncomplicated: Secondary | ICD-10-CM

## 2018-02-26 MED ORDER — ALBUTEROL SULFATE HFA 108 (90 BASE) MCG/ACT IN AERS: 2.0000 | INHALATION_SPRAY | Freq: Four times a day (QID) | RESPIRATORY_TRACT | 2 refills | Status: DC | PRN

## 2018-02-26 NOTE — Assessment & Plan Note (Addendum)
Likely asthma exacerbation versus viral illness over the weekend.  He has improved at this point.  Symptoms improved with albuterol.  He will monitor for recurrence.  Albuterol refill sent to pharmacy.  Given return precautions.

## 2018-02-26 NOTE — Patient Instructions (Signed)
Nice to see you. Please monitor for recurrence of your symptoms and if they do recur please let us know. If you develop shortness of breath, chest pain, cough productive of blood, or other new or changing symptoms please seek medical attention. I refilled your albuterol.

## 2018-02-26 NOTE — Progress Notes (Signed)
  Tommi Rumps, MD Phone: [phone]  Jose Perry is a 77 y.o. male who presents today for same day.  CC: asthma  Patient notes over the weekend his asthma was acting up.  He developed cough with some postnasal drip.  Cough was productive yesterday of clear mucus.  Felt congested in his chest.  No wheezing.  Had some shortness of breath.  No chest pain.  No fevers.  He used albuterol which was tremendously helpful.  He notes this was consistent with his prior asthma exacerbations.  He has no nighttime symptoms.  He goes long stretches of time without needing albuterol.  He needs a refill on his albuterol inhaler.  Social History   Tobacco Use  Smoking Status Never Smoker  Smokeless Tobacco Never Used     ROS see history of present illness  Objective  Physical Exam Vitals:   02/26/18 1602  BP: 120/70  Pulse: 60  Temp: 97.7 F (36.5 C)  SpO2: 97%    BP Readings from Last 3 Encounters:  02/26/18 120/70  02/01/18 112/64  12/19/17 130/78   Wt Readings from Last 3 Encounters:  02/26/18 157 lb 6.4 oz (71.4 kg)  02/01/18 156 lb (70.8 kg)  12/19/17 152 lb 12.8 oz (69.3 kg)    Physical Exam  Constitutional: No distress.  Cardiovascular: Normal rate, regular rhythm and normal heart sounds.  Pulmonary/Chest: Effort normal and breath sounds normal.  Neurological: He is alert.  Skin: Skin is warm and dry. He is not diaphoretic.     Assessment/Plan: Please see individual problem list.  Asthma Likely asthma exacerbation versus viral illness over the weekend.  He has improved at this point.  Symptoms improved with albuterol.  He will monitor for recurrence.  Albuterol refill sent to pharmacy.  Given return precautions.   No orders of the defined types were placed in this encounter.   Meds ordered this encounter  Medications  . albuterol (PROVENTIL HFA;VENTOLIN HFA) 108 (90 Base) MCG/ACT inhaler    Sig: Inhale 2 puffs into the lungs every 6 (six) hours as needed.      Dispense:  1 Inhaler    Refill:  [place], MD [place]

## 2018-03-20 ENCOUNTER — Other Ambulatory Visit (INDEPENDENT_AMBULATORY_CARE_PROVIDER_SITE_OTHER): Payer: Medicare Other

## 2018-03-20 ENCOUNTER — Other Ambulatory Visit: Payer: Self-pay | Admitting: Internal Medicine

## 2018-03-20 DIAGNOSIS — I10 Essential (primary) hypertension: Secondary | ICD-10-CM | POA: Diagnosis not present

## 2018-03-20 DIAGNOSIS — D696 Thrombocytopenia, unspecified: Secondary | ICD-10-CM

## 2018-03-20 LAB — CBC WITH DIFFERENTIAL/PLATELET
BASOS PCT: 1 % (ref 0.0–3.0)
Basophils Absolute: 0 10*3/uL (ref 0.0–0.1)
EOS PCT: 4.2 % (ref 0.0–5.0)
Eosinophils Absolute: 0.1 10*3/uL (ref 0.0–0.7)
HEMATOCRIT: 40.6 % (ref 39.0–52.0)
HEMOGLOBIN: 13.3 g/dL (ref 13.0–17.0)
LYMPHS PCT: 37.3 % (ref 12.0–46.0)
Lymphs Abs: 1 10*3/uL (ref 0.7–4.0)
MCHC: 32.9 g/dL (ref 30.0–36.0)
MCV: 84.1 fl (ref 78.0–100.0)
MONOS PCT: 9.5 % (ref 3.0–12.0)
Monocytes Absolute: 0.3 10*3/uL (ref 0.1–1.0)
NEUTROS ABS: 1.3 10*3/uL — AB (ref 1.4–7.7)
Neutrophils Relative %: 48 % (ref 43.0–77.0)
PLATELETS: 125 10*3/uL — AB (ref 150.0–400.0)
RBC: 4.82 Mil/uL (ref 4.22–5.81)
RDW: 14.5 % (ref 11.5–15.5)
WBC: 2.7 10*3/uL — AB (ref 4.0–10.5)

## 2018-03-20 LAB — BASIC METABOLIC PANEL
BUN: 16 mg/dL (ref 6–23)
CO2: 26 meq/L (ref 19–32)
CREATININE: 1.23 mg/dL (ref 0.40–1.50)
Calcium: 10 mg/dL (ref 8.4–10.5)
Chloride: 105 mEq/L (ref 96–112)
GFR: 73.55 mL/min (ref >60.00)
Glucose, Bld: 84 mg/dL (ref 70–99)
Potassium: 3.7 mEq/L (ref 3.5–5.1)
Sodium: 139 mEq/L (ref 135–145)

## 2018-03-20 LAB — TSH: TSH: 1.61 u[IU]/mL (ref 0.35–4.50)

## 2018-03-20 LAB — LIPID PANEL
CHOLESTEROL: 175 mg/dL (ref 0–200)
HDL: 60.9 mg/dL (ref >39.00)
LDL CALC: 106 mg/dL — AB (ref 0–99)
NONHDL: 114.5
Total CHOL/HDL Ratio: 3
Triglycerides: 44 mg/dL (ref 0.0–149.0)
VLDL: 8.8 mg/dL (ref 0.0–40.0)

## 2018-03-20 LAB — HEPATIC FUNCTION PANEL
ALT: 13 U/L (ref 0–53)
AST: 22 U/L (ref 0–37)
Albumin: 4.2 g/dL (ref 3.5–5.2)
Alkaline Phosphatase: 68 U/L (ref 39–117)
BILIRUBIN TOTAL: 0.6 mg/dL (ref 0.2–1.2)
Bilirubin, Direct: 0.1 mg/dL (ref 0.0–0.3)
Total Protein: 6.8 g/dL (ref 6.0–8.3)

## 2018-03-22 ENCOUNTER — Encounter: Payer: Self-pay | Admitting: Internal Medicine

## 2018-03-22 ENCOUNTER — Ambulatory Visit (INDEPENDENT_AMBULATORY_CARE_PROVIDER_SITE_OTHER): Payer: Medicare Other | Admitting: Internal Medicine

## 2018-03-22 ENCOUNTER — Telehealth: Payer: Self-pay | Admitting: Internal Medicine

## 2018-03-22 DIAGNOSIS — Z860100 Personal history of colon polyps, unspecified: Secondary | ICD-10-CM

## 2018-03-22 DIAGNOSIS — I10 Essential (primary) hypertension: Secondary | ICD-10-CM

## 2018-03-22 DIAGNOSIS — D72819 Decreased white blood cell count, unspecified: Secondary | ICD-10-CM

## 2018-03-22 DIAGNOSIS — K219 Gastro-esophageal reflux disease without esophagitis: Secondary | ICD-10-CM

## 2018-03-22 DIAGNOSIS — E049 Nontoxic goiter, unspecified: Secondary | ICD-10-CM

## 2018-03-22 DIAGNOSIS — N400 Enlarged prostate without lower urinary tract symptoms: Secondary | ICD-10-CM

## 2018-03-22 DIAGNOSIS — D696 Thrombocytopenia, unspecified: Secondary | ICD-10-CM

## 2018-03-22 DIAGNOSIS — Z87898 Personal history of other specified conditions: Secondary | ICD-10-CM

## 2018-03-22 DIAGNOSIS — R339 Retention of urine, unspecified: Secondary | ICD-10-CM

## 2018-03-22 NOTE — Assessment & Plan Note (Signed)
Colonoscopy 02/01/18 - two diminutive polyps in the transverse colon and in the ascending colon and internal hemorrhoids.

## 2018-03-22 NOTE — Telephone Encounter (Signed)
Please place referral to neurology

## 2018-03-22 NOTE — Telephone Encounter (Signed)
I assume he is talking about urologist.  He was seeing Dr Jacqlyn Larsen and he is moving out of state.  Does he have a preference of where he goes.  Dr Cope's previous partner Dr Bernardo Heater is now in [place].  Does he want to see him?  Does he want to stay in same place where Dr Jacqlyn Larsen saw him?

## 2018-03-22 NOTE — Progress Notes (Signed)
Patient ID: Jose Perry, male   DOB: 1941/10/04, 77 y.o.   MRN: [id number]   Subjective:    Patient ID: Jose Perry, male    DOB: 06/10/1941, 77 y.o.   MRN: [id number]  HPI  Patient here for a scheduled follow up.  He reports he is doing well.  Stays active.  No chest pain.  No sob.  No acid reflux.  No abdominal pain.  Bowels moving.  No urine change.  Discussed recent labs.  Discussed calculated cholesterol risk.  Discussed recommendation for starting a cholesterol medication.  He declines.     Past Medical History:  Diagnosis Date  . Asthma   . Dyspnea   . Elevated blood pressure   . Enlarged prostate   . GERD (gastroesophageal reflux disease)   . Glaucoma   . History of adenomatous polyp of colon   . Hx of colonic polyps   . Hypertension   . Leukopenia    has had an extensive w/up.    . Liver hemangioma 01/24/2018  . Thyroid goiter    Past Surgical History:  Procedure Laterality Date  . BACK SURGERY  2002   ruptured disc  . COLONOSCOPY W/ POLYPECTOMY  04/24/2005, 07/06/2010, 07/08/2014  . COLONOSCOPY WITH PROPOFOL N/A 02/01/2018   Procedure: COLONOSCOPY WITH PROPOFOL;  Surgeon: Manya Silvas, MD;  Location: [facility] ENDOSCOPY;  Service: Endoscopy;  Laterality: N/A;  . ESOPHAGOGASTRODUODENOSCOPY  04/24/2005, 07/06/2010,  . EYE SURGERY  2009   relieve pressure from glaucoma  . THYROID SURGERY  1968   goiter   Family History  Problem Relation Age of Onset  . Heart disease Mother   . Alcohol abuse Father   . Hyperlipidemia Father   . Stroke Father    Social History   Socioeconomic History  . Marital status: Married    Spouse name: Not on file  . Number of children: Not on file  . Years of education: Not on file  . Highest education level: Not on file  Occupational History  . Not on file  Social Needs  . Financial resource strain: Not on file  . Food insecurity:    Worry: Not on file    Inability: Not on file  . Transportation needs:    Medical: Not on file      Non-medical: Not on file  Tobacco Use  . Smoking status: Never Smoker  . Smokeless tobacco: Never Used  Substance and Sexual Activity  . Alcohol use: No    Alcohol/week: 0.0 oz  . Drug use: No  . Sexual activity: Not on file  Lifestyle  . Physical activity:    Days per week: Not on file    Minutes per session: Not on file  . Stress: Not on file  Relationships  . Social connections:    Talks on phone: Not on file    Gets together: Not on file    Attends religious service: Not on file    Active member of club or organization: Not on file    Attends meetings of clubs or organizations: Not on file    Relationship status: Not on file  Other Topics Concern  . Not on file  Social History Narrative  . Not on file    Outpatient Encounter Medications as of 03/22/2018  Medication Sig  . albuterol (PROVENTIL HFA;VENTOLIN HFA) 108 (90 Base) MCG/ACT inhaler Inhale 2 puffs into the lungs every 6 (six) hours as needed.  Marland Kitchen amLODipine (NORVASC) 5 MG tablet TAKE  1 TABLET BY MOUTH EVERY DAY  . bimatoprost (LUMIGAN) 0.03 % ophthalmic solution Place 1 drop into the right eye at bedtime.   Marland Kitchen omeprazole (PRILOSEC) 40 MG capsule Take 40 mg daily by mouth.   Marland Kitchen SIMBRINZA 1-0.2 % SUSP Place 2 drops daily at 12 noon into the right eye.   Marland Kitchen TAMSULOSIN HCL PO Take 0.4 mg by mouth daily.    No facility-administered encounter medications on file as of 03/22/2018.     Review of Systems  Constitutional: Negative for appetite change and unexpected weight change.  HENT: Negative for congestion and sinus pressure.   Respiratory: Negative for cough, chest tightness and shortness of breath.   Cardiovascular: Negative for chest pain, palpitations and leg swelling.  Gastrointestinal: Negative for abdominal pain, diarrhea, nausea and vomiting.  Genitourinary: Negative for difficulty urinating and dysuria.  Musculoskeletal: Negative for joint swelling and myalgias.  Skin: Negative for color change and rash.   Neurological: Negative for dizziness, light-headedness and headaches.  Psychiatric/Behavioral: Negative for agitation and dysphoric mood.       Objective:    Physical Exam  Constitutional: He appears well-developed and well-nourished. No distress.  HENT:  Nose: Nose normal.  Mouth/Throat: Oropharynx is clear and moist.  Neck: Neck supple.  Cardiovascular: Normal rate and regular rhythm.  Pulmonary/Chest: Effort normal and breath sounds normal. No respiratory distress.  Abdominal: Soft. Bowel sounds are normal. There is no tenderness.  Musculoskeletal: He exhibits no edema or tenderness.  Lymphadenopathy:    He has no cervical adenopathy.  Skin: No rash noted. No erythema.  Psychiatric: He has a normal mood and affect. His behavior is normal.    BP 132/78 (BP Location: Left Arm, Patient Position: Sitting, Cuff Size: Normal)   Pulse (!) 59   Temp 97.7 F (36.5 C) (Oral)   Resp 18   Wt 158 lb 9.6 oz (71.9 kg)   SpO2 98%   BMI 22.76 kg/m  Wt Readings from Last 3 Encounters:  03/22/18 158 lb 9.6 oz (71.9 kg)  02/26/18 157 lb 6.4 oz (71.4 kg)  02/01/18 156 lb (70.8 kg)     Lab Results  Component Value Date   WBC 2.7 (L) 03/20/2018   HGB 13.3 03/20/2018   HCT 40.6 03/20/2018   PLT 125.0 (L) 03/20/2018   GLUCOSE 84 03/20/2018   CHOL 175 03/20/2018   TRIG 44.0 03/20/2018   HDL 60.90 03/20/2018   LDLCALC 106 (H) 03/20/2018   ALT 13 03/20/2018   AST 22 03/20/2018   NA 139 03/20/2018   K 3.7 03/20/2018   CL 105 03/20/2018   CREATININE 1.23 03/20/2018   BUN 16 03/20/2018   CO2 26 03/20/2018   TSH 1.61 03/20/2018   PSA 2.75 08/11/2014       Assessment & Plan:   Problem List Items Addressed This Visit    Enlarged prostate    Has been followed by urology - Dr Jacqlyn Larsen.        GERD (gastroesophageal reflux disease)    Controlled on current regimen.  Follow.        Goiter    Previous CT with enlarged left thyroid lobe and what appeared to be nodule present.   Evaluated by endocrinology 09/26/17.  Stable.  Recommended f/u in one year.        History of colonic polyps    Colonoscopy 02/01/18 - two diminutive polyps in the transverse colon and in the ascending colon and internal hemorrhoids.  Hypertension    Blood pressure under good control.  Continue same medication regimen.  Follow pressures.  Follow metabolic panel.        Leukopenia    Has been worked up by hematology previously.  Previous bone marrow biopsy.  White count on recent check 2.7.  Platelet count decreased but stable.  Follow cbc.        Relevant Orders   CBC with Differential/Platelet   Thrombocytopenia (HCC)    Follow platelet count.        Relevant Orders   CBC with Differential/Platelet       Einar Pheasant, MD

## 2018-03-22 NOTE — Telephone Encounter (Signed)
Pt is requesting a referral to a new neurologist, his is moving out of this area.

## 2018-03-25 ENCOUNTER — Encounter: Payer: Self-pay | Admitting: Internal Medicine

## 2018-03-25 NOTE — Telephone Encounter (Signed)
Spoke to patient he would prefer to see  urologist here in [place] . Patient is aware that your out of office this week. Thanks

## 2018-03-25 NOTE — Assessment & Plan Note (Signed)
Previous CT with enlarged left thyroid lobe and what appeared to be nodule present.  Evaluated by endocrinology 09/26/17.  Stable.  Recommended f/u in one year.

## 2018-03-25 NOTE — Assessment & Plan Note (Signed)
Has been followed by urology - Dr Jacqlyn Larsen.

## 2018-03-25 NOTE — Assessment & Plan Note (Signed)
Follow platelet count.

## 2018-03-25 NOTE — Assessment & Plan Note (Signed)
Controlled on current regimen.  Follow.  

## 2018-03-25 NOTE — Assessment & Plan Note (Signed)
Has been worked up by hematology previously.  Previous bone marrow biopsy.  White count on recent check 2.7.  Platelet count decreased but stable.  Follow cbc.

## 2018-03-25 NOTE — Telephone Encounter (Signed)
Left message to see if patient would like to see Dr. Bernardo Heater at the office that Dr. Jacqlyn Larsen was at or if he would like to go to a different urologist.

## 2018-03-25 NOTE — Assessment & Plan Note (Signed)
Blood pressure under good control.  Continue same medication regimen.  Follow pressures.  Follow metabolic panel.   

## 2018-03-26 NOTE — Telephone Encounter (Signed)
Order placed for urology referral her in [place].  Someone should be contacting him with appt date and time.

## 2018-04-22 ENCOUNTER — Other Ambulatory Visit (INDEPENDENT_AMBULATORY_CARE_PROVIDER_SITE_OTHER): Payer: Medicare Other

## 2018-04-22 DIAGNOSIS — D696 Thrombocytopenia, unspecified: Secondary | ICD-10-CM

## 2018-04-22 DIAGNOSIS — D72819 Decreased white blood cell count, unspecified: Secondary | ICD-10-CM

## 2018-04-22 LAB — CBC WITH DIFFERENTIAL/PLATELET
BASOS ABS: 0 10*3/uL (ref 0.0–0.1)
Basophils Relative: 1.2 % (ref 0.0–3.0)
EOS ABS: 0.1 10*3/uL (ref 0.0–0.7)
EOS PCT: 3.7 % (ref 0.0–5.0)
HCT: 41.6 % (ref 39.0–52.0)
HEMOGLOBIN: 13.5 g/dL (ref 13.0–17.0)
Lymphocytes Relative: 35.2 % (ref 12.0–46.0)
Lymphs Abs: 1 10*3/uL (ref 0.7–4.0)
MCHC: 32.3 g/dL (ref 30.0–36.0)
MCV: 84.3 fl (ref 78.0–100.0)
MONO ABS: 0.3 10*3/uL (ref 0.1–1.0)
Monocytes Relative: 9.5 % (ref 3.0–12.0)
Neutro Abs: 1.4 10*3/uL (ref 1.4–7.7)
Neutrophils Relative %: 50.4 % (ref 43.0–77.0)
Platelets: 131 10*3/uL — ABNORMAL LOW (ref 150.0–400.0)
RBC: 4.94 Mil/uL (ref 4.22–5.81)
RDW: 14.9 % (ref 11.5–15.5)
WBC: 2.7 10*3/uL — AB (ref 4.0–10.5)

## 2018-04-25 ENCOUNTER — Other Ambulatory Visit: Payer: Self-pay | Admitting: Internal Medicine

## 2018-04-25 DIAGNOSIS — D72819 Decreased white blood cell count, unspecified: Secondary | ICD-10-CM

## 2018-04-25 DIAGNOSIS — D696 Thrombocytopenia, unspecified: Secondary | ICD-10-CM

## 2018-04-25 NOTE — Progress Notes (Signed)
Order placed for hematology referral.  

## 2018-05-05 ENCOUNTER — Other Ambulatory Visit: Payer: Self-pay | Admitting: Family Medicine

## 2018-05-06 ENCOUNTER — Inpatient Hospital Stay: Payer: Medicare Other | Attending: Hematology and Oncology | Admitting: Hematology and Oncology

## 2018-05-06 ENCOUNTER — Encounter (INDEPENDENT_AMBULATORY_CARE_PROVIDER_SITE_OTHER): Payer: Self-pay

## 2018-05-06 ENCOUNTER — Inpatient Hospital Stay: Payer: Medicare Other

## 2018-05-06 VITALS — BP 128/77 | HR 54 | Temp 96.3°F | Resp 18 | Ht 70.0 in | Wt 157.6 lb

## 2018-05-06 DIAGNOSIS — D72819 Decreased white blood cell count, unspecified: Secondary | ICD-10-CM | POA: Insufficient documentation

## 2018-05-06 DIAGNOSIS — J45909 Unspecified asthma, uncomplicated: Secondary | ICD-10-CM | POA: Diagnosis not present

## 2018-05-06 DIAGNOSIS — E041 Nontoxic single thyroid nodule: Secondary | ICD-10-CM | POA: Diagnosis not present

## 2018-05-06 DIAGNOSIS — D696 Thrombocytopenia, unspecified: Secondary | ICD-10-CM

## 2018-05-06 DIAGNOSIS — D1809 Hemangioma of other sites: Secondary | ICD-10-CM | POA: Diagnosis not present

## 2018-05-06 DIAGNOSIS — Z79899 Other long term (current) drug therapy: Secondary | ICD-10-CM

## 2018-05-06 DIAGNOSIS — K219 Gastro-esophageal reflux disease without esophagitis: Secondary | ICD-10-CM

## 2018-05-06 DIAGNOSIS — N400 Enlarged prostate without lower urinary tract symptoms: Secondary | ICD-10-CM | POA: Diagnosis not present

## 2018-05-06 DIAGNOSIS — I10 Essential (primary) hypertension: Secondary | ICD-10-CM | POA: Insufficient documentation

## 2018-05-06 LAB — CBC WITH DIFFERENTIAL/PLATELET
BASOS ABS: 0 10*3/uL (ref 0–0.1)
Basophils Relative: 1 %
Eosinophils Absolute: 0.1 10*3/uL (ref 0–0.7)
Eosinophils Relative: 3 %
HEMATOCRIT: 41.7 % (ref 40.0–52.0)
Hemoglobin: 13.5 g/dL (ref 13.0–18.0)
LYMPHS PCT: 36 %
Lymphs Abs: 0.9 10*3/uL — ABNORMAL LOW (ref 1.0–3.6)
MCH: 27.2 pg (ref 26.0–34.0)
MCHC: 32.5 g/dL (ref 32.0–36.0)
MCV: 83.9 fL (ref 80.0–100.0)
MONO ABS: 0.2 10*3/uL (ref 0.2–1.0)
Monocytes Relative: 8 %
NEUTROS ABS: 1.4 10*3/uL (ref 1.4–6.5)
Neutrophils Relative %: 52 %
Platelets: 156 10*3/uL (ref 150–440)
RBC: 4.97 MIL/uL (ref 4.40–5.90)
RDW: 15.2 % — AB (ref 11.5–14.5)
WBC: 2.6 10*3/uL — ABNORMAL LOW (ref 3.8–10.6)

## 2018-05-06 LAB — TSH: TSH: 1.852 u[IU]/mL (ref 0.350–4.500)

## 2018-05-06 LAB — VITAMIN B12: VITAMIN B 12: 185 pg/mL (ref 180–914)

## 2018-05-06 LAB — TECHNOLOGIST SMEAR REVIEW: TECH REVIEW: NORMAL

## 2018-05-06 LAB — FOLATE: Folate: 9.6 ng/mL (ref >5.9)

## 2018-05-06 NOTE — Telephone Encounter (Signed)
Patient said he did not request the refill for albuterol that it is on automatic refills. Please disregard. Patient also wants to know if Dr Nicki Reaper will refill his TAMSULOSIN HCL PO until he sees his neurologist at the end of the month.         [facility] [place], [place] [address] [place] [address]  Phone: [phone] Fax: [phone]

## 2018-05-06 NOTE — Telephone Encounter (Signed)
I do not mind refilling the medication, but please confirm with pt how often he is using.  Per note, albuterol inhaler rx sent in 02/2018 with 2 refills.  Does he know he has refills?  Just need more info.  Is he having problems breathing?

## 2018-05-06 NOTE — Progress Notes (Signed)
Patient here today as new evaluation regarding Thrombocytopenia/leukopenia.  Referred by Dr. Nicki Reaper.

## 2018-05-06 NOTE — Telephone Encounter (Signed)
LMTCB

## 2018-05-06 NOTE — Progress Notes (Signed)
[facility] day:  05/06/2018  Chief Complaint: Jose Perry is a 77 y.o. male with thrombocytopenia and leukopenia who is referred in consultation by Dr. Einar Pheasant for assessment and management.  HPI:  The patient states that 30 years ago he had blood counts that were not normal.  He has a persistent low white blood count and intermittent low platelet count.  He was seen by a hematologist in [place].  Evaluation included extensive blood work and a bone marrow.  Etiology was not determined.  He was followed for 5 years without treatment.    He denies any issues with infections.  He denies gingivitis.  He denies any new medications or herbal products.  He denies any family history of blood disorders or autoimmune disease.  He denies any issues with excess bruising or bleeding.  Review of CBCs dating back to 03/2014 reveal documented leukopenia beginning on 08/11/2014.  WBC has ranged between 2600 - 3600 with an isolated WBC 4400 on 08/21/2017.  ANC has been between 1100 - 1800.  He has had mild thrombocytopenia since 03/22/2014.  Platelet count has ranged between 91,000 -131,000.  CBC on 03/20/2018 revealed a hematocrit of 40.6, hemoglobin 13.3, MCV 84.1, platelets 125,000, WBC 2700 with an ANC of 1300.   CBC on 04/22/2018 revealed a hematocrit of 41.6, hemoglobin 13.5, MCV 84.3, platelets 131,000, WBC 2700 with an ANC of 1400.  Liver CT on on 07/06/2017 revealed a stable 8.2 x 4.8 cm enhancing mass occupying a large portion of the the LEFT lateral hepatic lobe is not changed significantly in size or imaging characteristics from comparison CT of 10/19/2011. Lesion has imaging characteristics typical and atypical of a benign hemangioma.  Spleen is normal in size.   Symptomatically, he feels well.  Weight is stable (157-160 pounds).  He denies any fevers or sweats.   Past Medical History:  Diagnosis Date  . Asthma   . Dyspnea   . Elevated  blood pressure   . Enlarged prostate   . GERD (gastroesophageal reflux disease)   . Glaucoma   . History of adenomatous polyp of colon   . Hx of colonic polyps   . Hypertension   . Leukopenia    has had an extensive w/up.    . Liver hemangioma 01/24/2018  . Thyroid goiter     Past Surgical History:  Procedure Laterality Date  . BACK SURGERY  2002   ruptured disc  . COLONOSCOPY W/ POLYPECTOMY  04/24/2005, 07/06/2010, 07/08/2014  . COLONOSCOPY WITH PROPOFOL N/A 02/01/2018   Procedure: COLONOSCOPY WITH PROPOFOL;  Surgeon: Manya Silvas, MD;  Location: [facility] ENDOSCOPY;  Service: Endoscopy;  Laterality: N/A;  . ESOPHAGOGASTRODUODENOSCOPY  04/24/2005, 07/06/2010,  . EYE SURGERY  2009   relieve pressure from glaucoma  . THYROID SURGERY  1968   goiter    Family History  Problem Relation Age of Onset  . Heart disease Mother   . Alcohol abuse Father   . Hyperlipidemia Father   . Stroke Father     Social History:  reports that he has never smoked. He has never used smokeless tobacco. He reports that he does not drink alcohol or use drugs.  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  He denies any exposure to radiation or toxins. The patient is alone today.  Allergies: No Known Allergies  Current Medications: Current Outpatient Medications  Medication Sig Dispense Refill  . albuterol (PROVENTIL HFA;VENTOLIN HFA)  108 (90 Base) MCG/ACT inhaler Inhale 2 puffs into the lungs every 6 (six) hours as needed. 1 Inhaler 2  . amLODipine (NORVASC) 5 MG tablet TAKE 1 TABLET BY MOUTH EVERY DAY 90 tablet 1  . bimatoprost (LUMIGAN) 0.03 % ophthalmic solution Place 1 drop into the right eye at bedtime.     Marland Kitchen omeprazole (PRILOSEC) 40 MG capsule Take 40 mg daily by mouth.     Marland Kitchen SIMBRINZA 1-0.2 % SUSP Place 2 drops daily at 12 noon into the right eye.     Marland Kitchen TAMSULOSIN HCL PO Take 0.4 mg by mouth daily.      No current facility-administered medications for this visit.     Review of  Systems:  GENERAL:  Feels good.  Active.  No fevers, sweats or weight loss.  Patient notes that he can "lose weight if he gets busy". PERFORMANCE STATUS (ECOG):  0 HEENT:  Allergies.  Sinus drainage.  No visual changes, sore throat, mouth sores or tenderness. Lungs: No shortness of breath or cough.  No hemoptysis. Cardiac:  No chest pain, palpitations, orthopnea, or PND. GI:  No nausea, vomiting, diarrhea, constipation, melena or hematochezia.  Colonoscopy negative. GU:  No urgency, frequency, dysuria, or hematuria. Musculoskeletal:  No back pain.  No joint pain.  No muscle tenderness. Extremities:  No pain or swelling. Skin:  No rashes or skin changes. Neuro:  No headache, numbness or weakness, balance or coordination issues. Endocrine:  No diabetes, thyroid issues, hot flashes or night sweats. Psych:  No mood changes, depression or anxiety. Pain:  No focal pain. Review of systems:  All other systems reviewed and found to be negative.  Physical Exam: Blood pressure 128/77, pulse (!) 54, temperature (!) 96.3 F (35.7 C), temperature source Tympanic, resp. rate 18, height 5\' 10"  (1.778 m), weight 157 lb 9 oz (71.5 kg), SpO2 97 %. GENERAL:  Well developed, well nourished, gentleman sitting comfortably in the exam room in no acute distress. MENTAL STATUS:  Alert and oriented to person, place and time. HEAD:  Short dark hair.  Normocephalic, atraumatic, face symmetric, no Cushingoid features. EYES:  Brown eyes.  Slight dysconjugate gaze.  Pupils equal round and reactive to light and accomodation.  No conjunctivitis or scleral icterus. ENT:  Oropharynx clear without lesion.  Tongue normal. Mucous membranes moist.  RESPIRATORY:  Clear to auscultation without rales, wheezes or rhonchi. CARDIOVASCULAR:  Regular rate and rhythm without murmur, rub or gallop. ABDOMEN:  Soft, non-tender, with active bowel sounds, and no hepatosplenomegaly.  No masses. SKIN:  No rashes, ulcers or  lesions. EXTREMITIES: No edema, no skin discoloration or tenderness.  No palpable cords. LYMPH NODES: No palpable cervical, supraclavicular, axillary or inguinal adenopathy  NEUROLOGICAL: Unremarkable. PSYCH:  Appropriate.   Appointment on 05/06/2018  Component Date Value Ref Range Status  . Tech Review 05/06/2018 RBC MORPHOLOGY NORMAL   Final   Comment: PLATELETS APPEAR ADEQUATE WBC MORPHOLOGY UNREMARKABLE Performed at [facility], [address], [place], [place] [address]   . Anti Nuclear Antibody(ANA) 05/06/2018 Negative  Negative Final   Comment: (NOTE) Performed At: [facility] [place], [place] [id number] Rush Farmer MD JA:[id number]   . HIV Screen 4th Generation wRfx 05/06/2018 Non Reactive  Non Reactive Final   Comment: (NOTE) Performed At: [facility] [place], [place] [id number] Rush Farmer MD OX:[id number]   . HCV Ab 05/06/2018 <0.1  0.0 - 0.9 s/co ratio Final   Comment: (NOTE)  Negative:     < 0.8                             Indeterminate: 0.8 - 0.9                                  Positive:     > 0.9 The CDC recommends that a positive HCV antibody result be followed up with a HCV Nucleic Acid Amplification test (829562). Performed At: [facility] [place], [place] [id number] Rush Farmer MD ON:[id number]   . Hep B Core Total Ab 05/06/2018 Negative  Negative Final   Comment: (NOTE) Performed At: [facility] [place], [place] [id number] Rush Farmer MD DG:[id number]   . TSH 05/06/2018 1.852  0.350 - 4.500 uIU/mL Final   Comment: Performed by a 3rd Generation assay with a functional sensitivity of <=0.01 uIU/mL. Performed at [facility], [address] [place], [place] [address]   . Folate 05/06/2018 9.6  >5.9 ng/mL Final   Performed at [facility], [place] [place], [place] [address]  .  Vitamin B-12 05/06/2018 185  180 - 914 pg/mL Final   Comment: (NOTE) This assay is not validated for testing neonatal or myeloproliferative syndrome specimens for Vitamin B12 levels. Performed at [facility], [place] [address] [place], [place] [address]   . WBC 05/06/2018 2.6* 3.8 - 10.6 K/uL Final  . RBC 05/06/2018 4.97  4.40 - 5.90 MIL/uL Final  . Hemoglobin 05/06/2018 13.5  13.0 - 18.0 g/dL Final  . HCT 05/06/2018 41.7  40.0 - 52.0 % Final  . MCV 05/06/2018 83.9  80.0 - 100.0 fL Final  . MCH 05/06/2018 27.2  26.0 - 34.0 pg Final  . MCHC 05/06/2018 32.5  32.0 - 36.0 g/dL Final  . RDW 05/06/2018 15.2* 11.5 - 14.5 % Final  . Platelets 05/06/2018 156  150 - 440 K/uL Final  . Neutrophils Relative % 05/06/2018 52  % Final  . Neutro Abs 05/06/2018 1.4  1.4 - 6.5 K/uL Final  . Lymphocytes Relative 05/06/2018 36  % Final  . Lymphs Abs 05/06/2018 0.9* 1.0 - 3.6 K/uL Final  . Monocytes Relative 05/06/2018 8  % Final  . Monocytes Absolute 05/06/2018 0.2  0.2 - 1.0 K/uL Final  . Eosinophils Relative 05/06/2018 3  % Final  . Eosinophils Absolute 05/06/2018 0.1  0 - 0.7 K/uL Final  . Basophils Relative 05/06/2018 1  % Final  . Basophils Absolute 05/06/2018 0.0  0 - 0.1 K/uL Final   Performed at [facility], [address] [place], [place] [address]    Assessment:  Jose Perry is a 77 y.o. male with mild leukopenia and thrombocytopenia.  He has a greater than 30 year history of low counts.  He denies any issues with infections, gingivitis, bruising or bleeding.  He denies any new medications, herbal products, or alcohol.  He denies any family history of blood disorders or autoimmune disease.   CBCs dating back to 03/2014 reveal leukopenia beginning on 08/11/2014.  WBC has ranged between 2600 - 3600 with an isolated WBC 4400 on 08/21/2017.  ANC has been between 1100 - 1800.  He has had mild thrombocytopenia since 03/22/2014.  Platelet count has ranged between 91,000 -131,000.  He  has had extensive testing including a bone marrow ([place])  in the distant past.  Work-up was negative per patient (no report available).  Liver CT on on 07/06/2017 revealed a stable 8.2 x 4.8 cm enhancing mass occupying a large portion of the the LEFT lateral hepatic lobe (compared to 10/19/2011).  Lesion has imaging characteristics typical and atypical of a benign hemangioma.  Spleen is normal in size.  Symptomatically, he is doing well.  Exam is unremarkable.  Plan: 1.  Discuss mild leukopenia and thrombocytopenia.  By history, he has had low counts for 30 years.  Outside work-up unavailable including bone marrow.  Discuss general work-up.  No indication for more extensive testing unless patient develops new or concerning symptoms.  Differential diagnosis reviewed (vitamin deficiency, viral infections, autoimmune disease, medications/drugs).  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   2.  Labs today:  CBC with diff, B12, folate, copper, TSH, hepatitis B core antibody total, hepatitis C antibody, HIV antibody, ANA with reflex, myeloma panel, 3.  Peripheral smear for review. 4.  RTC in 1 week for MD assessment and review of work-up.   Lequita Asal, MD  05/06/2018, 4:08 PM

## 2018-05-06 NOTE — Telephone Encounter (Signed)
Last OV 03/22/18 last filled 02/26/18 1 2rf by Dr.Sonnenberg

## 2018-05-07 ENCOUNTER — Encounter: Payer: Self-pay | Admitting: Hematology and Oncology

## 2018-05-07 LAB — HIV ANTIBODY (ROUTINE TESTING W REFLEX): HIV SCREEN 4TH GENERATION: NONREACTIVE

## 2018-05-07 LAB — ANA W/REFLEX: Anti Nuclear Antibody(ANA): NEGATIVE

## 2018-05-07 LAB — HEPATITIS B CORE ANTIBODY, TOTAL: HEP B C TOTAL AB: NEGATIVE

## 2018-05-07 LAB — HEPATITIS C ANTIBODY

## 2018-05-07 NOTE — Telephone Encounter (Signed)
Ok to refill x 1 month

## 2018-05-07 NOTE — Telephone Encounter (Signed)
Can we call in partial script until he sees urology

## 2018-05-08 ENCOUNTER — Other Ambulatory Visit: Payer: Self-pay

## 2018-05-08 LAB — MULTIPLE MYELOMA PANEL, SERUM
ALBUMIN/GLOB SERPL: 1.2 (ref 0.7–1.7)
ALPHA 1: 0.3 g/dL (ref 0.0–0.4)
ALPHA2 GLOB SERPL ELPH-MCNC: 0.8 g/dL (ref 0.4–1.0)
Albumin SerPl Elph-Mcnc: 4 g/dL (ref 2.9–4.4)
B-GLOBULIN SERPL ELPH-MCNC: 1.1 g/dL (ref 0.7–1.3)
GAMMA GLOB SERPL ELPH-MCNC: 1.4 g/dL (ref 0.4–1.8)
GLOBULIN, TOTAL: 3.5 g/dL (ref 2.2–3.9)
IGG (IMMUNOGLOBIN G), SERUM: 1259 mg/dL (ref 700–1600)
IgA: 133 mg/dL (ref 61–437)
IgM (Immunoglobulin M), Srm: 32 mg/dL (ref 15–143)
Total Protein ELP: 7.5 g/dL (ref 6.0–8.5)

## 2018-05-08 LAB — COPPER, SERUM: COPPER: 117 ug/dL (ref 72–166)

## 2018-05-08 MED ORDER — TAMSULOSIN HCL 0.4 MG PO CAPS: 0.4000 mg | ORAL_CAPSULE | Freq: Every day | ORAL | 0 refills | Status: DC

## 2018-05-08 NOTE — Telephone Encounter (Signed)
Refilled and left message letting patient know

## 2018-05-14 ENCOUNTER — Inpatient Hospital Stay: Payer: Medicare Other | Attending: Hematology and Oncology | Admitting: Hematology and Oncology

## 2018-05-14 ENCOUNTER — Other Ambulatory Visit: Payer: Self-pay | Admitting: Hematology and Oncology

## 2018-05-14 ENCOUNTER — Encounter: Payer: Self-pay | Admitting: Hematology and Oncology

## 2018-05-14 VITALS — BP 157/84 | HR 56 | Temp 96.4°F | Resp 18 | Wt 157.1 lb

## 2018-05-14 DIAGNOSIS — E538 Deficiency of other specified B group vitamins: Secondary | ICD-10-CM | POA: Diagnosis not present

## 2018-05-14 DIAGNOSIS — D72819 Decreased white blood cell count, unspecified: Secondary | ICD-10-CM | POA: Diagnosis not present

## 2018-05-14 DIAGNOSIS — N400 Enlarged prostate without lower urinary tract symptoms: Secondary | ICD-10-CM | POA: Diagnosis not present

## 2018-05-14 DIAGNOSIS — D696 Thrombocytopenia, unspecified: Secondary | ICD-10-CM | POA: Insufficient documentation

## 2018-05-14 DIAGNOSIS — I10 Essential (primary) hypertension: Secondary | ICD-10-CM | POA: Insufficient documentation

## 2018-05-14 DIAGNOSIS — J45909 Unspecified asthma, uncomplicated: Secondary | ICD-10-CM | POA: Diagnosis not present

## 2018-05-14 DIAGNOSIS — K219 Gastro-esophageal reflux disease without esophagitis: Secondary | ICD-10-CM | POA: Diagnosis not present

## 2018-05-14 DIAGNOSIS — D72829 Elevated white blood cell count, unspecified: Secondary | ICD-10-CM | POA: Insufficient documentation

## 2018-05-14 DIAGNOSIS — Z79899 Other long term (current) drug therapy: Secondary | ICD-10-CM

## 2018-05-14 NOTE — Progress Notes (Signed)
Patient offers no complaints today. 

## 2018-05-14 NOTE — Progress Notes (Signed)
[place] [facility] day:  05/14/2018   Chief Complaint: Jose Perry is a 77 y.o. male with thrombocytopenia and leukopenia who is seen for review of work-up and discussion regarding direction of therapy.  HPI:  The patient was last seen in the hematology clinic on 05/06/2018 for initial consultation.  He had mild leukopenia and thrombocytopenia dating back for 30 years. He underwent a work-up.   CBC revealed a hematocrit of 41.7, hemoglobin 13.5, MCV 83.9, platelets 156,000, WBC 2,600 with an ANC of 1400.  B12 was 185 (low).  Normal studies included: folate (9.6), TSH, copper, ANA, SPEP, hepatitis B core antibody total, hepatitis C antibody, and HIV testing.  Peripheral smear revealed platelets adequate.  WBC morphology was unremarkable.  During the interim, patient is doing well. He verbalizes no complaints today. He is feeling generally well. Patient denies bleeding; no hematochezia, melena, or gross hematuria. Patient denies that he has experienced any B symptoms. He denies any interval infections.   Patient advises that he maintains an adequate appetite. He is eating well. Weight today is 157 lb 2 oz (71.3 kg), which compared to his last visit to the clinic, represents a stable weight. Patient notes that he has known about his B12 deficiency for a while. He has been on oral supplementation in the past, however stopped after changing providers.   Patient denies pain in the clinic today.   Past Medical History:  Diagnosis Date  . Asthma   . Dyspnea   . Elevated blood pressure   . Enlarged prostate   . GERD (gastroesophageal reflux disease)   . Glaucoma   . History of adenomatous polyp of colon   . Hx of colonic polyps   . Hypertension   . Leukopenia    has had an extensive w/up.    . Liver hemangioma 01/24/2018  . Thyroid goiter     Past Surgical History:  Procedure Laterality Date  . BACK SURGERY  2002   ruptured disc  . COLONOSCOPY W/  POLYPECTOMY  04/24/2005, 07/06/2010, 07/08/2014  . COLONOSCOPY WITH PROPOFOL N/A 02/01/2018   Procedure: COLONOSCOPY WITH PROPOFOL;  Surgeon: Manya Silvas, MD;  Location: [facility] ENDOSCOPY;  Service: Endoscopy;  Laterality: N/A;  . ESOPHAGOGASTRODUODENOSCOPY  04/24/2005, 07/06/2010,  . EYE SURGERY  2009   relieve pressure from glaucoma  . THYROID SURGERY  1968   goiter    Family History  Problem Relation Age of Onset  . Heart disease Mother   . Alcohol abuse Father   . Hyperlipidemia Father   . Stroke Father     Social History:  reports that he has never smoked. He has never used smokeless tobacco. He reports that he does not drink alcohol or use drugs.  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  He denies any exposure to radiation or toxins. The patient is alone today.  Allergies: No Known Allergies  Current Medications: Current Outpatient Medications  Medication Sig Dispense Refill  . albuterol (PROVENTIL HFA;VENTOLIN HFA) 108 (90 Base) MCG/ACT inhaler Inhale 2 puffs into the lungs every 6 (six) hours as needed. 1 Inhaler 2  . amLODipine (NORVASC) 5 MG tablet TAKE 1 TABLET BY MOUTH EVERY DAY 90 tablet 1  . bimatoprost (LUMIGAN) 0.03 % ophthalmic solution Place 1 drop into the right eye at bedtime.     Marland Kitchen omeprazole (PRILOSEC) 40 MG capsule Take 40 mg daily by mouth.     Marland Kitchen SIMBRINZA 1-0.2 % SUSP  Place 2 drops daily at 12 noon into the right eye.     . tamsulosin (FLOMAX) 0.4 MG CAPS capsule Take 1 capsule (0.4 mg total) by mouth daily. 30 capsule 0   No current facility-administered medications for this visit.     Review of Systems:  GENERAL:  Feels good.  Active.  No fevers, sweats or weight loss. PERFORMANCE STATUS (ECOG):  0 HEENT:  Allergies.  No visual changes, runny nose, sore throat, mouth sores or tenderness. Lungs: No shortness of breath or cough.  No hemoptysis. Cardiac:  No chest pain, palpitations, orthopnea, or PND. GI:  No nausea, vomiting, diarrhea,  constipation, melena or hematochezia. GU:  No urgency, frequency, dysuria, or hematuria. Musculoskeletal:  No back pain.  No joint pain.  No muscle tenderness. Extremities:  No pain or swelling. Skin:  No rashes or skin changes. Neuro:  No headache, numbness or weakness, balance or coordination issues. Endocrine:  No diabetes, thyroid issues, hot flashes or night sweats. Psych:  No mood changes, depression or anxiety. Pain:  No focal pain. Review of systems:  All other systems reviewed and found to be negative.   Physical Exam: Blood pressure (!) 157/84, pulse (!) 56, temperature (!) 96.4 F (35.8 C), temperature source Tympanic, resp. rate 18, weight 157 lb 2 oz (71.3 kg). GENERAL:  Well developed, well nourished, gentleman sitting comfortably in the exam room in no acute distress. MENTAL STATUS:  Alert and oriented to person, place and time. HEAD:  Short dark hair.  Normocephalic, atraumatic, face symmetric, no Cushingoid features. EYES:  Brown eyes.  Slight dysconjugate gaze.  No conjunctivitis or scleral icterus. NEUROLOGICAL: Unremarkable. PSYCH:  Appropriate.    No visits with results within 3 Day(s) from this visit.  Latest known visit with results is:  Appointment on 05/06/2018  Component Date Value Ref Range Status  . IgG (Immunoglobin G), Serum 05/06/2018 1,259  700 - 1,600 mg/dL Final  . IgA 05/06/2018 133  61 - 437 mg/dL Final  . IgM (Immunoglobulin M), Srm 05/06/2018 32  15 - 143 mg/dL Final  . Total Protein ELP 05/06/2018 7.5  6.0 - 8.5 g/dL Corrected  . Albumin SerPl Elph-Mcnc 05/06/2018 4.0  2.9 - 4.4 g/dL Corrected  . Alpha 1 05/06/2018 0.3  0.0 - 0.4 g/dL Corrected  . Alpha2 Glob SerPl Elph-Mcnc 05/06/2018 0.8  0.4 - 1.0 g/dL Corrected  . B-Globulin SerPl Elph-Mcnc 05/06/2018 1.1  0.7 - 1.3 g/dL Corrected  . Gamma Glob SerPl Elph-Mcnc 05/06/2018 1.4  0.4 - 1.8 g/dL Corrected  . M Protein SerPl Elph-Mcnc 05/06/2018 Not Observed  Not Observed g/dL Corrected  .  Globulin, Total 05/06/2018 3.5  2.2 - 3.9 g/dL Corrected  . Albumin/Glob SerPl 05/06/2018 1.2  0.7 - 1.7 Corrected  . IFE 1 05/06/2018 Comment   Corrected   An apparent normal immunofixation pattern.  . Please Note 05/06/2018 Comment   Corrected   Comment: (NOTE) Protein electrophoresis scan will follow via computer, mail, or courier delivery. Performed At: [facility] [place], [place] [id number] Rush Farmer MD FY:[id number]   . Tech Review 05/06/2018 RBC MORPHOLOGY NORMAL   Final   Comment: PLATELETS APPEAR ADEQUATE WBC MORPHOLOGY UNREMARKABLE Performed at [facility], [address], [place], [place] [address]   . Anti Nuclear Antibody(ANA) 05/06/2018 Negative  Negative Final   Comment: (NOTE) Performed At: [facility] [place], [place] [id number] Rush Farmer MD ER:[id number]   . HIV Screen 4th Generation wRfx 05/06/2018  Non Reactive  Non Reactive Final   Comment: (NOTE) Performed At: [facility] [place], [place] [id number] Rush Farmer MD PZ:[id number]   . HCV Ab 05/06/2018 <0.1  0.0 - 0.9 s/co ratio Final   Comment: (NOTE)                                  Negative:     < 0.8                             Indeterminate: 0.8 - 0.9                                  Positive:     > 0.9 The CDC recommends that a positive HCV antibody result be followed up with a HCV Nucleic Acid Amplification test (423536). Performed At: [facility] [place], [place] [id number] Rush Farmer MD QQ:[id number]   . Hep B Core Total Ab 05/06/2018 Negative  Negative Final   Comment: (NOTE) Performed At: [facility] [place], [place] [id number] Rush Farmer MD IP:[id number]   . TSH 05/06/2018 1.852  0.350 - 4.500 uIU/mL Final   Comment: Performed by a 3rd Generation assay with a functional sensitivity of <=0.01 uIU/mL. Performed at [facility], [address], [place], [place] [address]   . Copper 05/06/2018 117  72 - 166 ug/dL Final   Comment: (NOTE) This test was developed and its performance characteristics determined by [facility]. It has not been cleared or approved by the Food and Drug Administration.                                Detection Limit = 5 Performed At: [facility] [place], [place] [id number] Rush Farmer MD DZ:[id number]   . Folate 05/06/2018 9.6  >5.9 ng/mL Final   Performed at [facility], [address] [place], [place] [address]  . Vitamin B-12 05/06/2018 185  180 - 914 pg/mL Final   Comment: (NOTE) This assay is not validated for testing neonatal or myeloproliferative syndrome specimens for Vitamin B12 levels. Performed at [facility], [place] [address] [place], [place] [address]   . WBC 05/06/2018 2.6* 3.8 - 10.6 K/uL Final  . RBC 05/06/2018 4.97  4.40 - 5.90 MIL/uL Final  . Hemoglobin 05/06/2018 13.5  13.0 - 18.0 g/dL Final  . HCT 05/06/2018 41.7  40.0 - 52.0 % Final  . MCV 05/06/2018 83.9  80.0 - 100.0 fL Final  . MCH 05/06/2018 27.2  26.0 - 34.0 pg Final  . MCHC 05/06/2018 32.5  32.0 - 36.0 g/dL Final  . RDW 05/06/2018 15.2* 11.5 - 14.5 % Final  . Platelets 05/06/2018 156  150 - 440 K/uL Final  . Neutrophils Relative % 05/06/2018 52  % Final  . Neutro Abs 05/06/2018 1.4  1.4 - 6.5 K/uL Final  . Lymphocytes Relative 05/06/2018 36  % Final  . Lymphs Abs 05/06/2018 0.9* 1.0 - 3.6 K/uL Final  . Monocytes Relative 05/06/2018 8  % Final  . Monocytes Absolute 05/06/2018 0.2  0.2 - 1.0 K/uL Final  . Eosinophils Relative 05/06/2018 3  % Final  . Eosinophils Absolute 05/06/2018 0.1  0 -  0.7 K/uL Final  . Basophils Relative 05/06/2018 1  % Final  . Basophils Absolute 05/06/2018 0.0  0 - 0.1 K/uL Final   Performed at [facility] [address] [place], [place] [address]    Assessment:  Jose Perry is a 77 y.o. male with mild leukopenia and  thrombocytopenia.  He has a greater than 30 year history of low counts.  He denies any issues with infections, gingivitis, bruising or bleeding.  He denies any new medications, herbal products, or alcohol.  He denies any family history of blood disorders or autoimmune disease.   CBCs dating back to 03/2014 reveal leukopenia beginning on 08/11/2014.  WBC has ranged between 2600 - 3600 with an isolated WBC 4400 on 08/21/2017.  ANC has been between 1100 - 1800.  He has had mild thrombocytopenia since 03/22/2014.  Platelet count has ranged between 91,000 -131,000.  He has had extensive testing including a bone marrow ([place]) in the distant past.  Work-up was negative per patient (no report available).  Work-up on 05/06/2018 revealed a hematocrit of 41.7, hemoglobin 13.5, MCV 83.9, platelets 156,000, WBC 2,600 with an ANC of 1400.  B12 was 185 (low).  Normal studies included: folate (9.6), TSH, copper, ANA, SPEP, hepatitis B core antibody total, hepatitis C antibody, and HIV testing.  Liver CT on on 07/06/2017 revealed a stable 8.2 x 4.8 cm enhancing mass occupying a large portion of the the LEFT lateral hepatic lobe (compared to 10/19/2011).  Lesion has imaging characteristics typical and atypical of a benign hemangioma.  Spleen is normal in size.  Symptomatically, he denies any complaints.  Exam is unremarkable.  Plan: 1.  Review work-up.  Discuss B12 deficiency. 2.  B12 deficiency:  Discuss trial of oral B12 (1000 mcg po q day).  Recheck levels in 1 month.  If B12 does not improve, discuss intiation of B12 injections.  Discuss evaluation for pernicious anemia: anti-parietal antibody, intrinsic factor antibody (at next blood draw). 3.  Leukopenia and thrombocytopenia:  Possibly related to B12 deficiency.  Consideration of further work-up if counts do not recover with correction of B12 deficiency. 4.  RTC in 1 month for MD assessment, labs (CBC with diff, anti-parietal Ab, intrinsic factor  Ab, B12).    Honor Loh, NP  05/14/2018 , 12:01 PM    I saw and evaluated the patient, participating in the key portions of the service and reviewing pertinent diagnostic studies and records.  I reviewed the nurse practitioner's note and agree with the findings and the plan.  The assessment and plan were discussed with the patient.  A few questions were asked by the patient and answered.   Nolon Stalls, MD 05/14/2018,12:01 PM

## 2018-05-29 ENCOUNTER — Telehealth: Payer: Self-pay | Admitting: Internal Medicine

## 2018-05-29 ENCOUNTER — Other Ambulatory Visit: Payer: Self-pay | Admitting: *Deleted

## 2018-05-29 MED ORDER — ALBUTEROL SULFATE HFA 108 (90 BASE) MCG/ACT IN AERS: 2.0000 | INHALATION_SPRAY | Freq: Four times a day (QID) | RESPIRATORY_TRACT | 2 refills | Status: DC | PRN

## 2018-05-29 NOTE — Telephone Encounter (Signed)
Rx refilled per protocol. LOV: 02/26/18

## 2018-05-29 NOTE — Telephone Encounter (Signed)
Copied from [place] [id number]. Topic: Quick Communication - Rx Refill/Question >> May 29, 2018 10:52 AM Selinda Flavin B, NT wrote: Medication: albuterol (PROVENTIL HFA;VENTOLIN HFA) 108 (90 Base) MCG/ACT inhaler  Has the patient contacted their pharmacy? Yes.   (Agent: If no, request that the patient contact the pharmacy for the refill.) (Agent: If yes, when and what did the pharmacy advise?)  Preferred Pharmacy (with phone number or street name): [id number] [address], [place]: Please be advised that RX refills may take up to 3 business days. We ask that you follow-up with your pharmacy.

## 2018-05-30 DIAGNOSIS — H401111 Primary open-angle glaucoma, right eye, mild stage: Secondary | ICD-10-CM | POA: Diagnosis not present

## 2018-05-31 ENCOUNTER — Other Ambulatory Visit: Payer: Self-pay | Admitting: Internal Medicine

## 2018-06-03 DIAGNOSIS — H401111 Primary open-angle glaucoma, right eye, mild stage: Secondary | ICD-10-CM | POA: Diagnosis not present

## 2018-06-04 ENCOUNTER — Encounter: Payer: Self-pay | Admitting: Urology

## 2018-06-04 ENCOUNTER — Ambulatory Visit (INDEPENDENT_AMBULATORY_CARE_PROVIDER_SITE_OTHER): Payer: Medicare Other | Admitting: Urology

## 2018-06-04 VITALS — BP 134/78 | HR 63 | Ht 70.0 in | Wt 154.3 lb

## 2018-06-04 DIAGNOSIS — Z87898 Personal history of other specified conditions: Secondary | ICD-10-CM | POA: Diagnosis not present

## 2018-06-04 DIAGNOSIS — N138 Other obstructive and reflux uropathy: Secondary | ICD-10-CM | POA: Diagnosis not present

## 2018-06-04 DIAGNOSIS — N401 Enlarged prostate with lower urinary tract symptoms: Secondary | ICD-10-CM

## 2018-06-04 NOTE — Progress Notes (Signed)
06/04/2018 3:16 PM   Jose Perry Aug 14, 1941 [id number]  Referring provider: Einar Pheasant, [address] [place], [place] [address]  Chief Complaint  Patient presents with  . Establish Care    HPI: 77 year old male presents to establish local urologic care.  He has been seen by Dr. Jacqlyn Larsen for the last several years for BPH and a history of an elevated PSA.  His PSA returned to baseline and he never had a prostate biopsy.  He last saw Dr. Jacqlyn Larsen in October 2018.  A PSA at that visit was 2.14.  He has no bothersome lower urinary tract symptoms on tamsulosin.  He denies dysuria or gross hematuria.  Denies flank, abdominal, pelvic or scrotal pain.   PMH: Past Medical History:  Diagnosis Date  . Asthma   . Dyspnea   . Elevated blood pressure   . Enlarged prostate   . GERD (gastroesophageal reflux disease)   . Glaucoma   . History of adenomatous polyp of colon   . Hx of colonic polyps   . Hypertension   . Leukopenia    has had an extensive w/up.    . Liver hemangioma 01/24/2018  . Thyroid goiter     Surgical History: Past Surgical History:  Procedure Laterality Date  . BACK SURGERY  2002   ruptured disc  . COLONOSCOPY W/ POLYPECTOMY  04/24/2005, 07/06/2010, 07/08/2014  . COLONOSCOPY WITH PROPOFOL N/A 02/01/2018   Procedure: COLONOSCOPY WITH PROPOFOL;  Surgeon: Manya Silvas, MD;  Location: [facility] ENDOSCOPY;  Service: Endoscopy;  Laterality: N/A;  . ESOPHAGOGASTRODUODENOSCOPY  04/24/2005, 07/06/2010,  . EYE SURGERY  2009   relieve pressure from glaucoma  . THYROID SURGERY  1968   goiter    Home Medications:  Allergies as of 06/04/2018      Reactions   Brimonidine Tartrate    Other reaction(s): Eye redness   Brinzolamide-brimonidine    Other reaction(s): Eye redness      Medication List        Accurate as of 06/04/18  3:16 PM. Always use your most recent med list.          albuterol 108 (90 Base) MCG/ACT inhaler Commonly known as:   PROVENTIL HFA;VENTOLIN HFA Inhale 2 puffs into the lungs every 6 (six) hours as needed.   amLODipine 5 MG tablet Commonly known as:  NORVASC TAKE 1 TABLET BY MOUTH EVERY DAY   bimatoprost 0.03 % ophthalmic solution Commonly known as:  LUMIGAN Place 1 drop into the right eye at bedtime.   omeprazole 40 MG capsule Commonly known as:  PRILOSEC Take 40 mg daily by mouth.   SIMBRINZA 1-0.2 % Susp Generic drug:  Brinzolamide-Brimonidine Place 2 drops daily at 12 noon into the right eye.   tamsulosin 0.4 MG Caps capsule Commonly known as:  FLOMAX TAKE 1 CAPSULE BY MOUTH EVERY DAY       Allergies:  Allergies  Allergen Reactions  . Brimonidine Tartrate     Other reaction(s): Eye redness  . Brinzolamide-Brimonidine     Other reaction(s): Eye redness    Family History: Family History  Problem Relation Age of Onset  . Heart disease Mother   . Alcohol abuse Father   . Hyperlipidemia Father   . Stroke Father     Social History:  reports that he has never smoked. He has never used smokeless tobacco. He reports that he does not drink alcohol or use drugs.  ROS: UROLOGY Frequent Urination?: No Hard to postpone urination?:  Yes Burning/pain with urination?: No Get up at night to urinate?: Yes Leakage of urine?: No Urine stream starts and stops?: No Trouble starting stream?: No Do you have to strain to urinate?: No Blood in urine?: No Urinary tract infection?: No Sexually transmitted disease?: No Injury to kidneys or bladder?: No Painful intercourse?: No Weak stream?: No Erection problems?: Yes Penile pain?: No  Gastrointestinal Nausea?: No Vomiting?: No Indigestion/heartburn?: No Diarrhea?: No Constipation?: No  Constitutional Fever: No Night sweats?: No Weight loss?: No Fatigue?: No  Skin Skin rash/lesions?: No Itching?: No  Eyes Blurred vision?: No Double vision?: No  Ears/Nose/Throat Sore throat?: No Sinus problems?:  No  Hematologic/Lymphatic Swollen glands?: No Easy bruising?: No  Cardiovascular Leg swelling?: No Chest pain?: No  Respiratory Cough?: No Shortness of breath?: No  Endocrine Excessive thirst?: No  Musculoskeletal Back pain?: No Joint pain?: No  Neurological Headaches?: No Dizziness?: No  Psychologic Depression?: No Anxiety?: No  Physical Exam: BP 134/78 (BP Location: Left Arm, Patient Position: Sitting, Cuff Size: Normal)   Pulse 63   Ht 5\' 10"  (1.778 m)   Wt 154 lb 4.8 oz (70 kg)   BMI 22.14 kg/m   Constitutional:  Alert and oriented, No acute distress. HEENT: Cataract AT, moist mucus membranes.  Trachea midline, no masses. Cardiovascular: No clubbing, cyanosis, or edema. Respiratory: Normal respiratory effort, no increased work of breathing. GI: Abdomen is soft, nontender, nondistended, no abdominal masses GU: No CVA tenderness.  Prostate 40 g, smooth without nodules. Lymph: No cervical or inguinal lymphadenopathy. Skin: No rashes, bruises or suspicious lesions. Neurologic: Grossly intact, no focal deficits, moving all 4 extremities. Psychiatric: Normal mood and affect.   Assessment & Plan:   77 year old male with history of elevated PSA.  He has stable lower urinary tract symptoms on tamsulosin.  Tamsulosin was refilled and he will follow-up annually.   [name], [place] [address] [place], [place] [address] [phone]

## 2018-06-05 DIAGNOSIS — H401111 Primary open-angle glaucoma, right eye, mild stage: Secondary | ICD-10-CM | POA: Diagnosis not present

## 2018-06-06 LAB — PSA: PROSTATE SPECIFIC AG, SERUM: 2.3 ng/mL (ref 0.0–4.0)

## 2018-06-07 ENCOUNTER — Telehealth: Payer: Self-pay

## 2018-06-07 NOTE — Telephone Encounter (Signed)
-----   Message from [name], MD sent at 06/07/2018 11:42 AM EDT ----- PSA stable at 2.3

## 2018-06-07 NOTE — Telephone Encounter (Signed)
Called pt, no answer. LM for pt informing him of lab results.

## 2018-06-09 ENCOUNTER — Encounter: Payer: Self-pay | Admitting: Urology

## 2018-06-09 IMAGING — US US SOFT TISSUE HEAD/NECK
1 series · 13 of 25 positions shown · non-contrast
Comparison: Prior CT scan of the chest 08/30/2015

CLINICAL DATA: 75-year-old male with a history of prior right
thyroidectomy and a thyroid nodule seen on prior CT scan.

EXAM:
THYROID ULTRASOUND
TECHNIQUE: Ultrasound examination of the thyroid gland and adjacent soft
tissues was performed.

[Series 1: us soft tissue head/neck · 0.09mm/px · 13 of 42 slices shown]
[im 1/42]
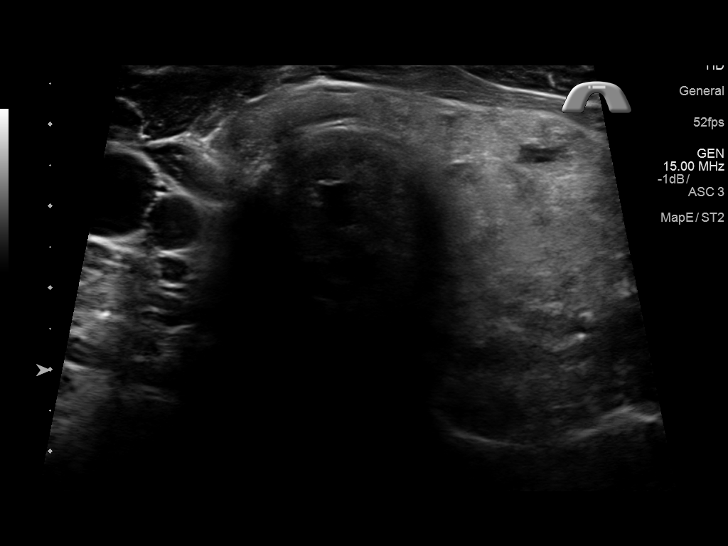
[im 4/42]
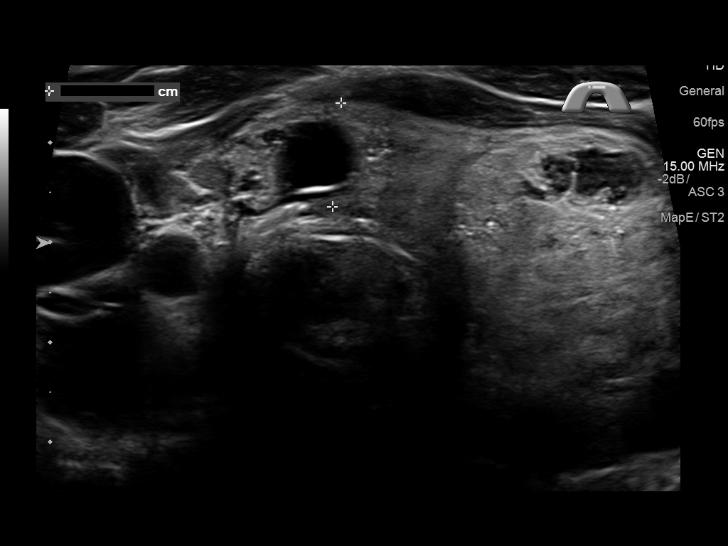
[im 7/42]
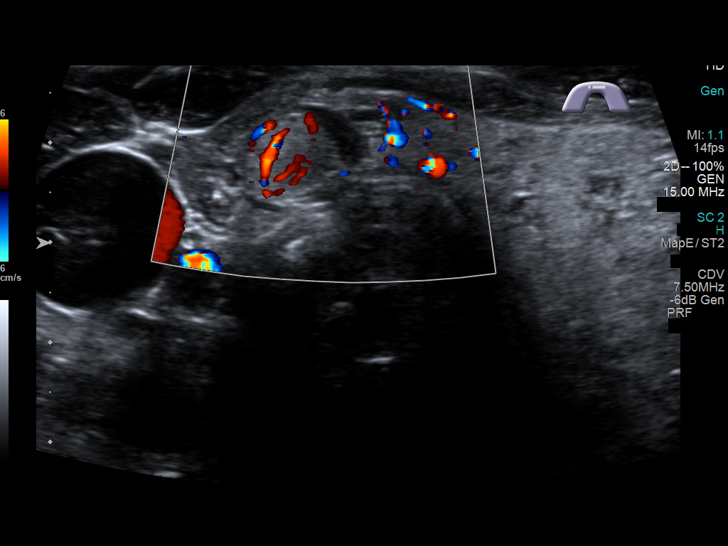
[im 11/42]
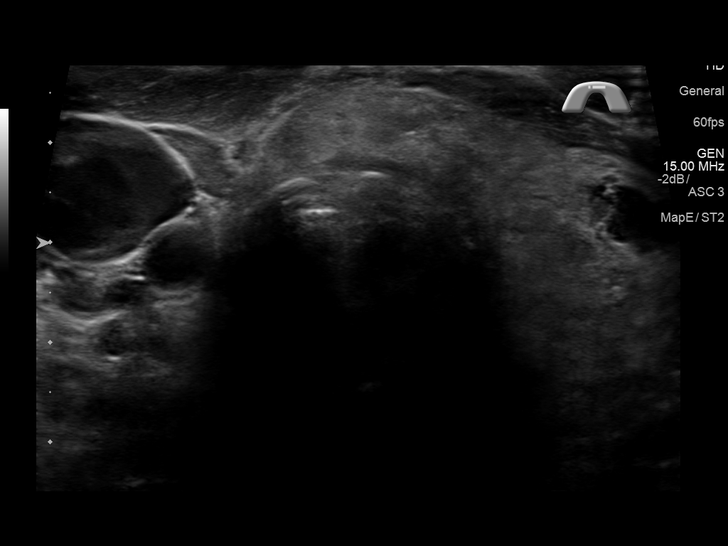
[im 14/42]
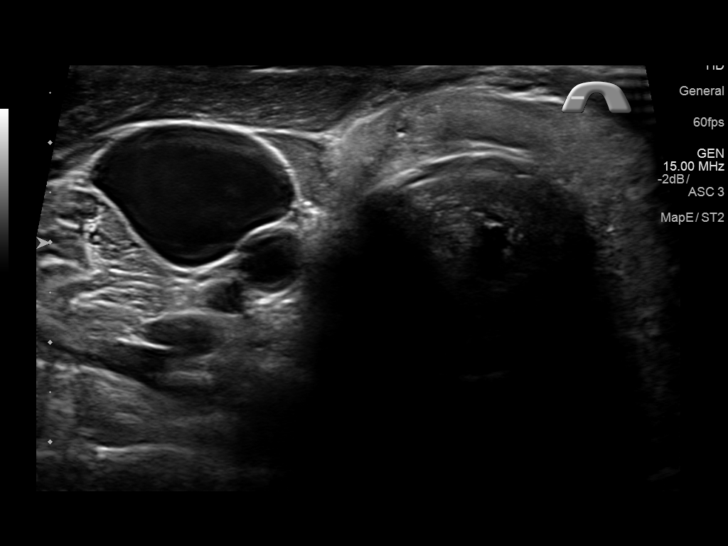
[im 18/42]
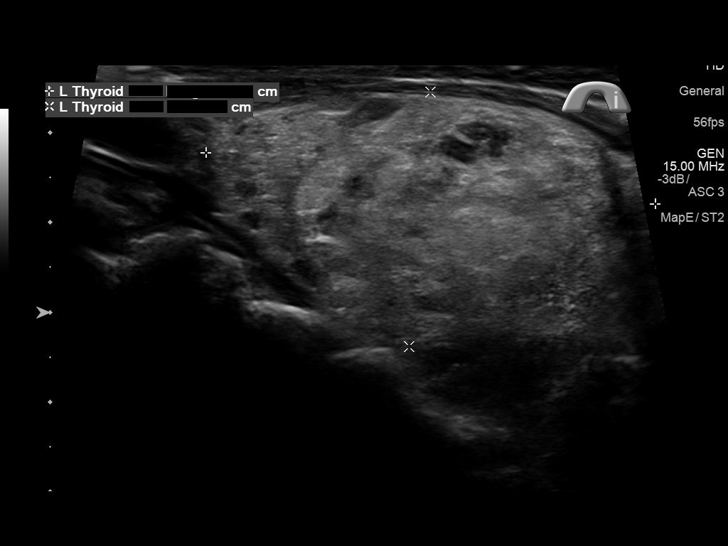
[im 21/42]
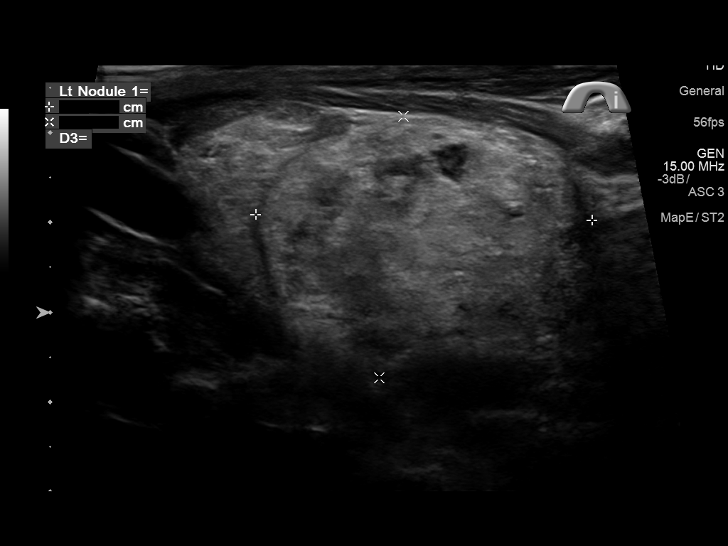
[im 24/42]
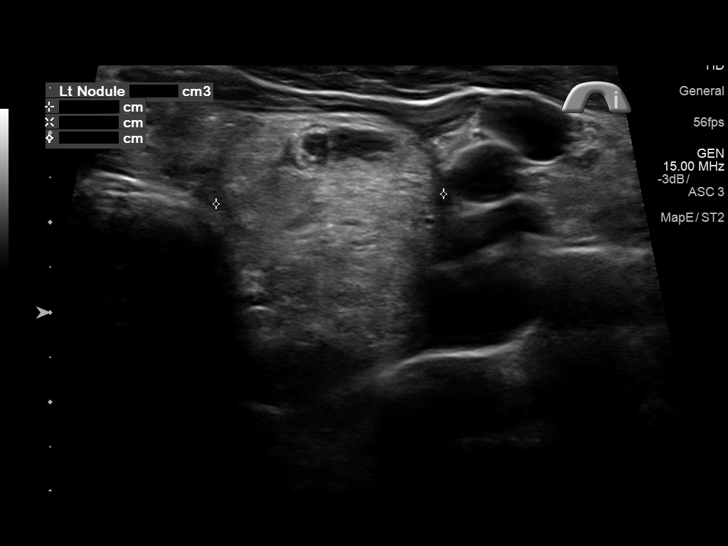
[im 28/42]
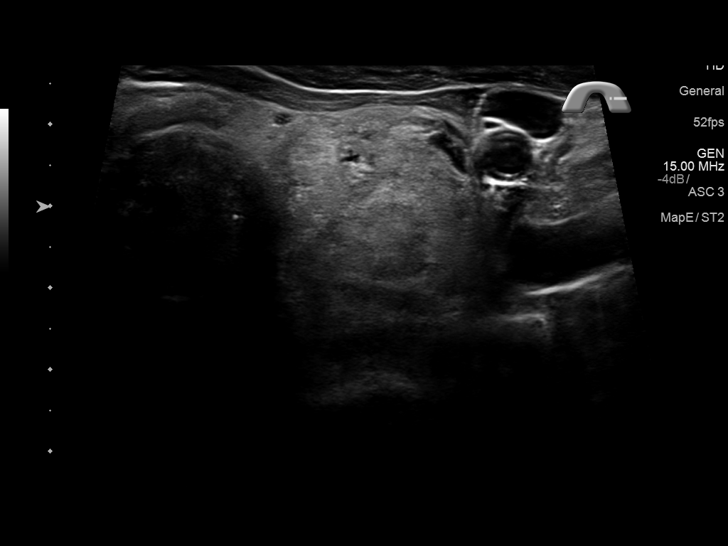
[im 31/42]
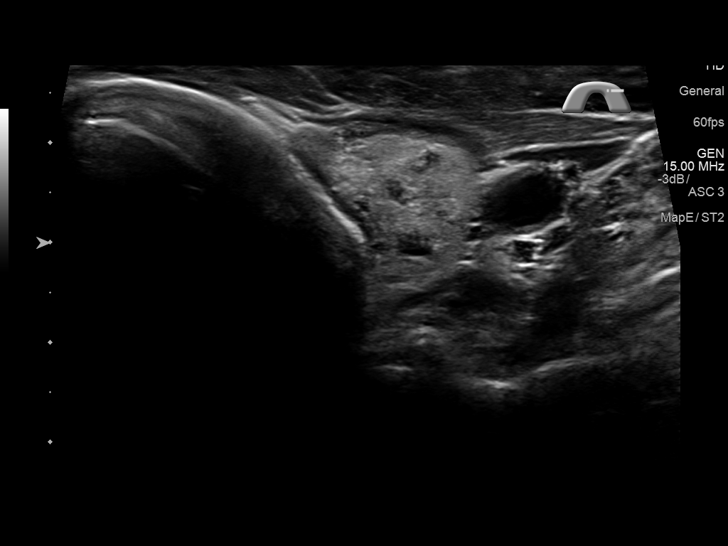
[im 35/42]
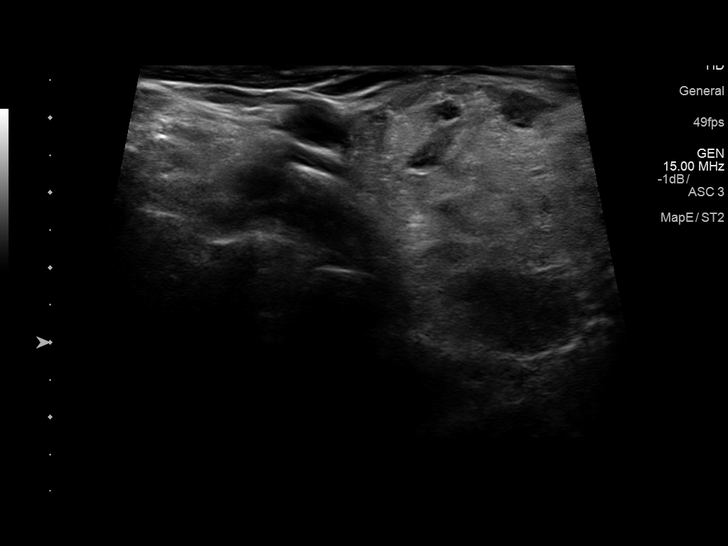
[im 38/42]
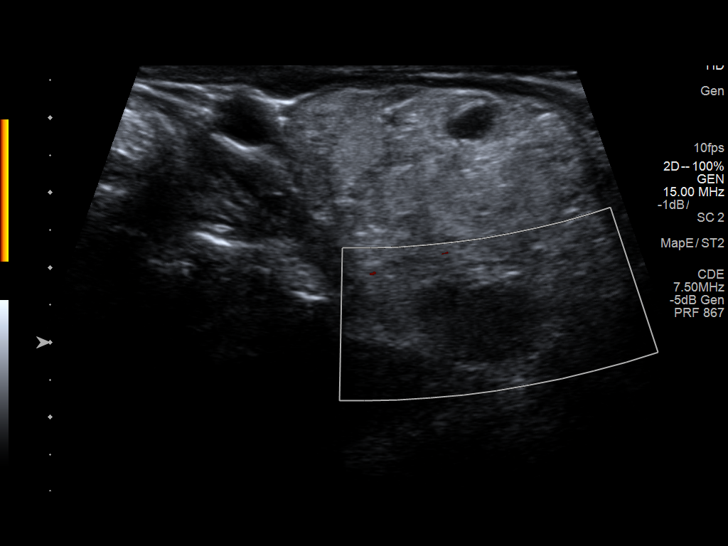
[im 42/42]
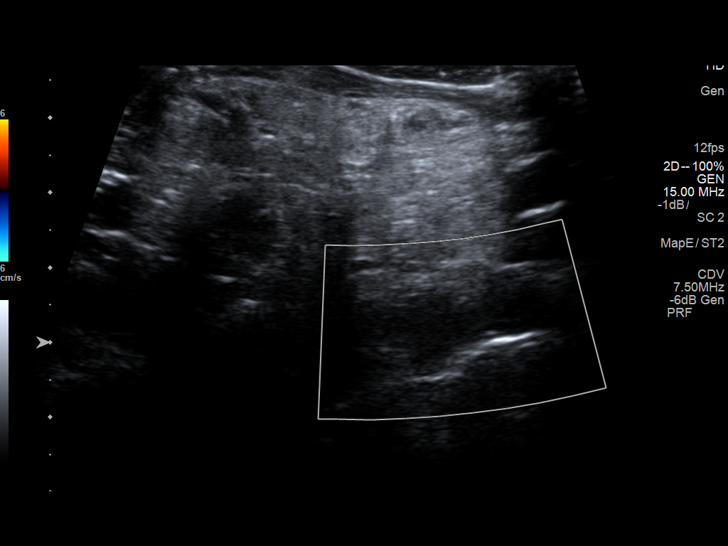

[13 of 25 positions shown; findings below may reference images not displayed]

FINDINGS: Parenchymal Echotexture: Markedly heterogenous

Estimated total number of nodules >/= 1 cm: 2

Number of spongiform nodules > 2 cm not described below (TR1): 0

Number of mixed cystic and solid nodules > 1.5 cm not described
below (TR2): 0

_________________________________________________________

Isthmus: 1.0 cm

Isoechoic mixed cystic and solid nodule in the thyroid isthmus does
not meet consensus criteria for either biopsy or dedicated
follow-up.

_________________________________________________________

Right lobe: Surgically absent.

_________________________________________________________

Left lobe: 5.0 x 2.8 cm

Nodule # 1:

Location: Left; Mid

Size: 3.7 x 2.9 x 2.5 cm

Composition: solid/almost completely solid (2)

Echogenicity: hyperechoic (1)

Shape: not taller-than-wide (0)

Margins: smooth (0)

Echogenic foci: none (0)

ACR TI-RADS total points: 3.

ACR TI-RADS risk category: TR3 (3 points).

ACR TI-RADS recommendations:

**Given size (>2.5 cm) and appearance, fine needle aspiration of
this mildly suspicious nodule should be considered based on TI-RADS
criteria.

Nodule # 2:

Location: Left; Inferior

Size: 1.7 x 1.1 x 1.2 cm

Composition: cannot determine (2)

Echogenicity: very hypoechoic (3)

Shape: not taller-than-wide (0)

Margins: ill-defined (0)

Echogenic foci: none (0)

ACR TI-RADS total points: 5.

ACR TI-RADS risk category: TR4 (4-6 points).

ACR TI-RADS recommendations:

**Given size (>1.5 cm) and appearance, fine needle aspiration of
this moderately suspicious nodule should be considered based on
TI-RADS criteria.
IMPRESSION: 1. Left mid -inferior Nodule #1: ACR TI-RADS TR 3. Recommend: Given
size (>2.5 cm) and appearance, fine needle aspiration of this mildly
suspicious nodule should be considered based on TI-RADS criteria.
2. Left inferior Nodule #2: ACR TI-RADS TR4. Recommend: Given size
(>1.5 cm) and appearance, fine needle aspiration of this moderately
suspicious nodule should be considered based on TI-RADS criteria.
3. Surgical changes of prior right thyroidectomy.

The above is in keeping with the ACR TI-RADS recommendations - [HOSPITAL] 2115;[DATE].

## 2018-06-09 MED ORDER — TAMSULOSIN HCL 0.4 MG PO CAPS: 0.4000 mg | ORAL_CAPSULE | Freq: Every day | ORAL | 3 refills | Status: DC

## 2018-06-13 DIAGNOSIS — H401111 Primary open-angle glaucoma, right eye, mild stage: Secondary | ICD-10-CM | POA: Diagnosis not present

## 2018-06-14 ENCOUNTER — Inpatient Hospital Stay: Payer: Medicare Other

## 2018-06-14 ENCOUNTER — Inpatient Hospital Stay: Payer: Medicare Other | Admitting: Hematology and Oncology

## 2018-06-14 NOTE — Progress Notes (Deleted)
[facility] day:  06/14/2018   Chief Complaint: Jose Perry is a 77 y.o. male with thrombocytopenia and leukopenia who is for 1 month assessment.  HPI:  The patient was last seen in the hematology clinic on 05/14/2018 for initial consultation.  Work-up revealed a low B12.  We discussed a trail of oral B12.  During the interim,    Past Medical History:  Diagnosis Date  . Asthma   . Dyspnea   . Elevated blood pressure   . Enlarged prostate   . GERD (gastroesophageal reflux disease)   . Glaucoma   . History of adenomatous polyp of colon   . Hx of colonic polyps   . Hypertension   . Leukopenia    has had an extensive w/up.    . Liver hemangioma 01/24/2018  . Thyroid goiter     Past Surgical History:  Procedure Laterality Date  . BACK SURGERY  2002   ruptured disc  . COLONOSCOPY W/ POLYPECTOMY  04/24/2005, 07/06/2010, 07/08/2014  . COLONOSCOPY WITH PROPOFOL N/A 02/01/2018   Procedure: COLONOSCOPY WITH PROPOFOL;  Surgeon: Manya Silvas, MD;  Location: [facility] ENDOSCOPY;  Service: Endoscopy;  Laterality: N/A;  . ESOPHAGOGASTRODUODENOSCOPY  04/24/2005, 07/06/2010,  . EYE SURGERY  2009   relieve pressure from glaucoma  . THYROID SURGERY  1968   goiter    Family History  Problem Relation Age of Onset  . Heart disease Mother   . Alcohol abuse Father   . Hyperlipidemia Father   . Stroke Father     Social History:  reports that he has never smoked. He has never used smokeless tobacco. He reports that he does not drink alcohol or use drugs.  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  He denies any exposure to radiation or toxins. The patient is alone today.  Allergies:  Allergies  Allergen Reactions  . Brimonidine Tartrate     Other reaction(s): Eye redness  . Brinzolamide-Brimonidine     Other reaction(s): Eye redness    Current Medications: Current Outpatient Medications  Medication Sig Dispense Refill  .  albuterol (PROVENTIL HFA;VENTOLIN HFA) 108 (90 Base) MCG/ACT inhaler Inhale 2 puffs into the lungs every 6 (six) hours as needed. 1 Inhaler 2  . amLODipine (NORVASC) 5 MG tablet TAKE 1 TABLET BY MOUTH EVERY DAY 90 tablet 1  . bimatoprost (LUMIGAN) 0.03 % ophthalmic solution Place 1 drop into the right eye at bedtime.     Marland Kitchen omeprazole (PRILOSEC) 40 MG capsule Take 40 mg daily by mouth.     Marland Kitchen SIMBRINZA 1-0.2 % SUSP Place 2 drops daily at 12 noon into the right eye.     . tamsulosin (FLOMAX) 0.4 MG CAPS capsule Take 1 capsule (0.4 mg total) by mouth daily. 90 capsule 3   No current facility-administered medications for this visit.     Review of Systems:  GENERAL:  Feels good.  Active.  No fevers, sweats or weight loss.  Patient notes that he can "lose weight if he gets busy". PERFORMANCE STATUS (ECOG):  0 HEENT:  Allergies.  Sinus drainage.  No visual changes, sore throat, mouth sores or tenderness. Lungs: No shortness of breath or cough.  No hemoptysis. Cardiac:  No chest pain, palpitations, orthopnea, or PND. GI:  No nausea, vomiting, diarrhea, constipation, melena or hematochezia.  Colonoscopy negative. GU:  No urgency, frequency, dysuria, or hematuria. Musculoskeletal:  No back pain.  No joint pain.  No  muscle tenderness. Extremities:  No pain or swelling. Skin:  No rashes or skin changes. Neuro:  No headache, numbness or weakness, balance or coordination issues. Endocrine:  No diabetes, thyroid issues, hot flashes or night sweats. Psych:  No mood changes, depression or anxiety. Pain:  No focal pain. Review of systems:  All other systems reviewed and found to be negative.  Physical Exam: There were no vitals taken for this visit. GENERAL:  Well developed, well nourished, gentleman sitting comfortably in the exam room in no acute distress. MENTAL STATUS:  Alert and oriented to person, place and time. HEAD:  Short dark hair.  Normocephalic, atraumatic, face symmetric, no Cushingoid  features. EYES:  Brown eyes.  Slight dysconjugate gaze.  Pupils equal round and reactive to light and accomodation.  No conjunctivitis or scleral icterus. ENT:  Oropharynx clear without lesion.  Tongue normal. Mucous membranes moist.  RESPIRATORY:  Clear to auscultation without rales, wheezes or rhonchi. CARDIOVASCULAR:  Regular rate and rhythm without murmur, rub or gallop. ABDOMEN:  Soft, non-tender, with active bowel sounds, and no hepatosplenomegaly.  No masses. SKIN:  No rashes, ulcers or lesions. EXTREMITIES: No edema, no skin discoloration or tenderness.  No palpable cords. LYMPH NODES: No palpable cervical, supraclavicular, axillary or inguinal adenopathy  NEUROLOGICAL: Unremarkable. PSYCH:  Appropriate.   No visits with results within 3 Day(s) from this visit.  Latest known visit with results is:  Office Visit on 06/04/2018  Component Date Value Ref Range Status  . Prostate Specific Ag, Serum 06/04/2018 2.3  0.0 - 4.0 ng/mL Final   Comment: **Verified by repeat analysis** Roche ECLIA methodology. According to the American Urological Association, Serum PSA should decrease and remain at undetectable levels after radical prostatectomy. The AUA defines biochemical recurrence as an initial PSA value 0.2 ng/mL or greater followed by a subsequent confirmatory PSA value 0.2 ng/mL or greater. Values obtained with different assay methods or kits cannot be used interchangeably. Results cannot be interpreted as absolute evidence of the presence or absence of malignant disease.     Assessment:  Jose Perry is a 77 y.o. male with mild leukopenia and thrombocytopenia.  He has a greater than 30 year history of low counts.  He denies any issues with infections, gingivitis, bruising or bleeding.  He denies any new medications, herbal products, or alcohol.  He denies any family history of blood disorders or autoimmune disease.   CBCs dating back to 03/2014 reveal leukopenia beginning on  08/11/2014.  WBC has ranged between 2600 - 3600 with an isolated WBC 4400 on 08/21/2017.  ANC has been between 1100 - 1800.  He has had mild thrombocytopenia since 03/22/2014.  Platelet count has ranged between 91,000 -131,000.  He has had extensive testing including a bone marrow ([place]) in the distant past.  Work-up was negative per patient (no report available).  Work-up on 05/06/2018 revealed a hematocrit of 41.7, hemoglobin 13.5, MCV 83.9, platelets 156,000, WBC 2,600 with an ANC of 1400.  B12 was 185 (low).  Normal studies included: folate (9.6), TSH, copper, ANA, SPEP, hepatitis B core antibody total, hepatitis C antibody, and HIV testing.  Liver CT on on 07/06/2017 revealed a stable 8.2 x 4.8 cm enhancing mass occupying a large portion of the the LEFT lateral hepatic lobe (compared to 10/19/2011).  Lesion has imaging characteristics typical and atypical of a benign hemangioma.  Spleen is normal in size.  Symptomatically,   Plan: 1.  Labs today:  CBC with diff, anti-parietal antibody, intrinsic  factor antibody, B12.    Lequita Asal, MD  06/14/2018 , 2:23 AM

## 2018-06-17 DIAGNOSIS — H6123 Impacted cerumen, bilateral: Secondary | ICD-10-CM | POA: Diagnosis not present

## 2018-06-17 DIAGNOSIS — J301 Allergic rhinitis due to pollen: Secondary | ICD-10-CM | POA: Diagnosis not present

## 2018-06-18 DIAGNOSIS — H401123 Primary open-angle glaucoma, left eye, severe stage: Secondary | ICD-10-CM | POA: Diagnosis not present

## 2018-06-25 DIAGNOSIS — H5010 Unspecified exotropia: Secondary | ICD-10-CM | POA: Diagnosis not present

## 2018-06-25 DIAGNOSIS — H401133 Primary open-angle glaucoma, bilateral, severe stage: Secondary | ICD-10-CM | POA: Diagnosis not present

## 2018-06-25 DIAGNOSIS — H2513 Age-related nuclear cataract, bilateral: Secondary | ICD-10-CM | POA: Diagnosis not present

## 2018-06-28 ENCOUNTER — Ambulatory Visit (INDEPENDENT_AMBULATORY_CARE_PROVIDER_SITE_OTHER): Payer: Medicare Other | Admitting: Family Medicine

## 2018-06-28 ENCOUNTER — Encounter: Payer: Self-pay | Admitting: Family Medicine

## 2018-06-28 VITALS — BP 130/82 | HR 53 | Temp 97.7°F | Ht 70.0 in | Wt 154.4 lb

## 2018-06-28 DIAGNOSIS — J4520 Mild intermittent asthma, uncomplicated: Secondary | ICD-10-CM

## 2018-06-28 DIAGNOSIS — H401190 Primary open-angle glaucoma, unspecified eye, stage unspecified: Secondary | ICD-10-CM

## 2018-06-28 MED ORDER — MONTELUKAST SODIUM 10 MG PO TABS: 10.0000 mg | ORAL_TABLET | Freq: Every day | ORAL | 1 refills | Status: DC

## 2018-06-28 NOTE — Progress Notes (Signed)
Subjective:    Patient ID: Jose Perry, male    DOB: 1940/12/30, 77 y.o.   MRN: [id number]  HPI   Presents to clinic to discuss asthma symptoms.  States he suffered from more severe asthma in childhood, but over the past many years he has not needed any medication to control his asthma symptoms.  Back in May 2019 he began to have shortness of breath symptoms again, and was started on albuterol inhaler as needed.  States that albuterol inhaler somewhat helpful, but has noticed over the past few weeks he gets out of breath more easily and albuterol inhaler does not seem to be as effective as it once was.  Patient recently had lab work in June 2019, this lab work revealed no anemia.  However it did show a slightly decreased white blood cell count and slightly decreased platelet count-patient is currently being worked up by hematology for this.  Patient also reports he had CT of chest and chest x-ray back in November 2018, no alarming abnormalities were seen on his lungs/heart on these imaging studies.  Patient denies any cough, no production of phlegm, no fever or chills.   Patient Active Problem List   Diagnosis Date Noted  . B12 deficiency 05/14/2018  . Thrombocytopenia ([place]) 12/19/2017  . Change in stool 10/12/2017  . Asthma 08/30/2017  . Lung nodule 08/30/2017  . Urinary urgency 05/23/2017  . Thyroid nodule 06/13/2016  . Bruit 06/14/2015  . Health care maintenance 12/13/2014  . Glaucoma 09/21/2014  . Goiter 06/28/2014  . Hypertension 06/28/2014  . Leukopenia 06/28/2014  . History of colonic polyps 06/28/2014  . Enlarged prostate 06/28/2014  . GERD (gastroesophageal reflux disease) 06/28/2014   Social History   Tobacco Use  . Smoking status: Never Smoker  . Smokeless tobacco: Never Used  Substance Use Topics  . Alcohol use: No    Alcohol/week: 0.0 standard drinks   Review of Systems  Constitutional: Negative for chills, fatigue and fever.  HENT: Negative for  congestion, ear pain, sinus pain and sore throat.   Eyes: Negative.   Respiratory: +shortness of breath.   Cardiovascular: Negative for chest pain, palpitations and leg swelling.  Gastrointestinal: Negative for abdominal pain, diarrhea, nausea and vomiting.  Genitourinary: Negative for dysuria, frequency and urgency.  Musculoskeletal: Negative for arthralgias and myalgias.  Skin: Negative for color change, pallor and rash.  Neurological: Negative for syncope, light-headedness and headaches.  Psychiatric/Behavioral: The patient is not nervous/anxious.       Objective:   Physical Exam  Constitutional:  He appears well-developed and well-nourished. No distress.   Head: Normocephalic and atraumatic.  Eyes: Conjunctivae and EOM are normal. No scleral icterus. Patient does have glaucoma. Neck: Normal range of motion. Neck supple. No tracheal deviation present.  Cardiovascular: Normal rate, regular rhythm and normal heart sounds.  Pulmonary/Chest: Effort normal and breath sounds normal. No respiratory distress. He has no wheezes. He has no rales.  Abdominal: Soft. Bowel sounds are normal. There is no tenderness.  Neurological: He is alert and oriented to person, place, and time.  Gait normal  Skin: Skin is warm and dry. He is not diaphoretic. No pallor.  Psychiatric: He has a normal mood and affect. His behavior is normal. Thought content normal.   Nursing note and vitals reviewed.    Vitals:   06/28/18 1012  BP: 130/82  Pulse: (!) 53  Temp: 97.7 F (36.5 C)  SpO2: 98%   Assessment & Plan:    Mild intermittent  asthma - patient will continue to use albuterol inhaler as needed for shortness of breath.  We will add Singulair 10 mg once daily to see if this helps calm down any sort of allergens could possibly be triggering asthma symptoms.  We will also do referral to pulmonology for further work-up including possible pulmonary function testing.  I looked into possibility of adding  inhaler such as Symbicort or Breo, but due to patient's glaucoma and allergies to brimonidine and brinzolamide; the Symbicort and/or Breo inhalers flagged as contraindicated due to possible allergy.  I am hopeful the Singulair will help lessen some of his symptoms.  Offered to order another chest x-ray in clinic today, but patient declines at this time.  Glaucoma --patient has upcoming glaucoma surgery, is looking forward to having this done.  He had glaucoma surgery on one eye 10 years ago and it was very successful.  Keep regular follow-up as already scheduled.  Return to clinic sooner if issues persist or worsen.

## 2018-06-28 NOTE — Patient Instructions (Signed)

## 2018-06-30 ENCOUNTER — Other Ambulatory Visit: Payer: Self-pay | Admitting: Internal Medicine

## 2018-07-10 ENCOUNTER — Encounter: Payer: Self-pay | Admitting: Pulmonary Disease

## 2018-07-10 ENCOUNTER — Ambulatory Visit (INDEPENDENT_AMBULATORY_CARE_PROVIDER_SITE_OTHER): Payer: Medicare Other | Admitting: Pulmonary Disease

## 2018-07-10 VITALS — BP 112/72 | HR 54 | Ht 68.5 in | Wt 154.6 lb

## 2018-07-10 DIAGNOSIS — R0789 Other chest pain: Secondary | ICD-10-CM | POA: Diagnosis not present

## 2018-07-10 DIAGNOSIS — H10509 Unspecified blepharoconjunctivitis, unspecified eye: Secondary | ICD-10-CM | POA: Insufficient documentation

## 2018-07-10 DIAGNOSIS — N401 Enlarged prostate with lower urinary tract symptoms: Secondary | ICD-10-CM | POA: Insufficient documentation

## 2018-07-10 DIAGNOSIS — H409 Unspecified glaucoma: Secondary | ICD-10-CM

## 2018-07-10 DIAGNOSIS — H5010 Unspecified exotropia: Secondary | ICD-10-CM | POA: Insufficient documentation

## 2018-07-10 DIAGNOSIS — R0602 Shortness of breath: Secondary | ICD-10-CM | POA: Diagnosis not present

## 2018-07-10 NOTE — Patient Instructions (Signed)
1) You will be having breathing tests performed  2) You will have a heart test performed  3) We will se you in follow up in 3-4 weeks

## 2018-07-10 NOTE — Progress Notes (Signed)
Subjective:    Patient ID: Jose Perry, male    DOB: August 27, 1941, 77 y.o.   MRN: [id number]  HPI  Patient is a 77 year old lifelong never smoker who presents for evaluation of shortness of breath. He is kindly referred by Jose Perry. The patient states that he has had the symptoms for approximately one year to a year and a half. He notes that he presented with the sensation of chest tightness in November 2018 to the emergency room at [facility], he had a chest CT formed at that time which I have independently reviewed and showed no evidence of pulmonary pathology or pulmonary emboli. The study was benign. The patient has noted ongoing issues with tightness feeling that he cannot take in nor let air out. Activity aggravates these issues. He also notices a dry cough on occasion. He has also noted that he occasionally has shoulder pain and arm pain particularly on the left when these symptoms present. He has not noticed diaphoresis. He does have significant orthopnea and has to sleep propped up. He does note acid reflux if he is not propped up. He has not had any fevers, chills or sweats. As noted, cough has been nonproductive, no hemoptysis. He has not had a recent cardiac evaluation.  He recently was given a trial of albuterol which initially help but now he feels that this is not helping at all. He was started on montelukast and he feels that this may have helped some with his symptoms but it has not been optimal. States that he had asthma as a child but was treated with "injections", he states he outgrew it. He is concerned about using too many medications and particularly inhalers, as he is to have glaucoma surgery on 17 October has been requested not to start too many new medications prior to that. Have reviewed the patient's ophthalmology notes and indeed they are leery about inhalers and inhaler therapy in this patient.  The patient does not have military history. No significant past occupational  history. He does not have any pets in the home no unusual hobbies. No exposure to TB in the past. He has not done any recent foreign or domestic travel. Previously resided in [place]. He has not lived out [place].  His family history, past medical history and surgical history have been reviewed in detail. Medications have also been reviewed.  Review of Systems  Constitutional: Positive for fatigue.  HENT: Negative.   Eyes: Positive for pain, redness and visual disturbance.       Issues with glaucoma  Respiratory: Positive for cough, chest tightness and shortness of breath. Negative for apnea, choking, wheezing and stridor.   Cardiovascular: Negative.   Gastrointestinal:       Gastroesophageal reflux symptoms with heartburn  Endocrine: Negative.   Genitourinary: Positive for decreased urine volume and difficulty urinating.       Has BPH  Musculoskeletal: Negative.   Skin: Negative.   Allergic/Immunologic: Negative.   Neurological: Negative.   Hematological: Negative.   Psychiatric/Behavioral: Negative.        Objective:   Physical Exam  Constitutional: He is oriented to person, place, and time. He appears well-developed and well-nourished. No distress.  HENT:  Head: Normocephalic and atraumatic.  Right Ear: External ear normal.  Left Ear: External ear normal.  Mouth/Throat: Oropharynx is clear and moist.  Eyes: Pupils are equal, round, and reactive to light. Conjunctivae are normal. Right eye exhibits discharge. Left eye exhibits no discharge. No scleral icterus.  He has some eye deviation particularly on the right previously documented. Continual tearing of the right eye  Neck: Normal range of motion. Neck supple. No JVD present. No tracheal deviation present. No thyromegaly present.  Cardiovascular: Normal rate, regular rhythm, normal heart sounds and intact distal pulses. Exam reveals no gallop and no friction rub.  No murmur heard. Pulmonary/Chest: Effort normal and breath  sounds normal. No stridor. No respiratory distress. He has no wheezes. He has no rales. He exhibits no tenderness.  Abdominal: Soft. Bowel sounds are normal. He exhibits no distension.  Musculoskeletal: Normal range of motion. He exhibits no edema, tenderness or deformity.  Lymphadenopathy:    He has no cervical adenopathy.  Neurological: He is alert and oriented to person, place, and time. A cranial nerve deficit is present.  Extraocular movements affected on the right eye, chronic.  Skin: Skin is warm and dry. No rash noted. He is not diaphoretic. No erythema. No pallor.  Psychiatric: He has a normal mood and affect. His behavior is normal. Judgment and thought content normal.  Nursing note and vitals reviewed.         Assessment & Plan:   1) Dyspnea: though the patient has a history of asthma documented as a child I cannot ascribe his symptoms to asthma based on this encounter however, this will need to be evaluated further. For this reason will obtain pulmonary function testing. He does not appear to have significant relief from albuterol which makes asthma as a diagnosis suspect. He does have some associated symptoms that may indicate cardiac origin of his dyspnea. Will obtain 2D Echo to evaluate this further. I would like to regroup with the patient in approximately 3 to 4 weeks after his glaucoma surgery the evaluation of his symptoms.  2) Chest tightness: again I cannot ascribe this to asthma. Evaluating as above.  3) Glaucoma: this issue adds complexity to his management particularly with regards to medications chosen for potential asthma.  4) Declined influenza vaccine: patient decline in flexor vaccine today would like to postpone this until after his surgery for glaucoma.  I am grateful for the opportunity to participate in this gentleman's care.

## 2018-07-11 ENCOUNTER — Ambulatory Visit: Payer: Medicare Other | Attending: Pulmonary Disease

## 2018-07-11 DIAGNOSIS — R0602 Shortness of breath: Secondary | ICD-10-CM | POA: Diagnosis not present

## 2018-07-11 DIAGNOSIS — R0789 Other chest pain: Secondary | ICD-10-CM

## 2018-07-11 MED ORDER — ALBUTEROL SULFATE (2.5 MG/3ML) 0.083% IN NEBU
2.5000 mg | INHALATION_SOLUTION | Freq: Once | RESPIRATORY_TRACT | Status: AC
  Administered 2018-07-11: 2.5 mg via RESPIRATORY_TRACT
  Filled 2018-07-11: qty 3

## 2018-07-12 ENCOUNTER — Ambulatory Visit
Admission: RE | Admit: 2018-07-12 | Discharge: 2018-07-12 | Disposition: A | Discharge status: Home / Self Care | Payer: Medicare Other | Source: Ambulatory Visit | Attending: Pulmonary Disease | Admitting: Pulmonary Disease

## 2018-07-12 DIAGNOSIS — J45909 Unspecified asthma, uncomplicated: Secondary | ICD-10-CM | POA: Insufficient documentation

## 2018-07-12 DIAGNOSIS — R0602 Shortness of breath: Secondary | ICD-10-CM | POA: Insufficient documentation

## 2018-07-12 DIAGNOSIS — K219 Gastro-esophageal reflux disease without esophagitis: Secondary | ICD-10-CM | POA: Diagnosis not present

## 2018-07-12 DIAGNOSIS — R0789 Other chest pain: Secondary | ICD-10-CM | POA: Diagnosis not present

## 2018-07-12 DIAGNOSIS — I119 Hypertensive heart disease without heart failure: Secondary | ICD-10-CM | POA: Diagnosis not present

## 2018-07-12 NOTE — Progress Notes (Signed)
*  PRELIMINARY RESULTS* Echocardiogram 2D Echocardiogram has been performed.  Sherrie Sport 07/12/2018, 11:35 AM

## 2018-07-16 ENCOUNTER — Telehealth: Payer: Self-pay | Admitting: *Deleted

## 2018-07-16 NOTE — Telephone Encounter (Signed)
Lab/MD per Gaspar Bidding 07/14/18 staff message. Appts. were scheduled as requested and patient is aware  of date and time of his scheduled visit.

## 2018-07-22 ENCOUNTER — Inpatient Hospital Stay: Payer: Medicare Other | Attending: Hematology and Oncology

## 2018-07-22 ENCOUNTER — Inpatient Hospital Stay (HOSPITAL_BASED_OUTPATIENT_CLINIC_OR_DEPARTMENT_OTHER): Payer: Medicare Other | Admitting: Hematology and Oncology

## 2018-07-22 ENCOUNTER — Other Ambulatory Visit: Payer: Self-pay

## 2018-07-22 VITALS — BP 147/84 | HR 48 | Temp 95.8°F | Resp 18 | Wt 154.3 lb

## 2018-07-22 DIAGNOSIS — E538 Deficiency of other specified B group vitamins: Secondary | ICD-10-CM | POA: Diagnosis not present

## 2018-07-22 DIAGNOSIS — D696 Thrombocytopenia, unspecified: Secondary | ICD-10-CM | POA: Diagnosis not present

## 2018-07-22 DIAGNOSIS — D72819 Decreased white blood cell count, unspecified: Secondary | ICD-10-CM | POA: Diagnosis not present

## 2018-07-22 LAB — CBC WITH DIFFERENTIAL/PLATELET
Abs Immature Granulocytes: 0.01 10*3/uL (ref 0.00–0.07)
Basophils Absolute: 0 10*3/uL (ref 0.0–0.1)
Basophils Relative: 1 %
Eosinophils Absolute: 0.1 10*3/uL (ref 0.0–0.5)
Eosinophils Relative: 4 %
HCT: 45.5 % (ref 39.0–52.0)
Hemoglobin: 14.3 g/dL (ref 13.0–17.0)
Immature Granulocytes: 0 %
Lymphocytes Relative: 33 %
Lymphs Abs: 1 10*3/uL (ref 0.7–4.0)
MCH: 26.7 pg (ref 26.0–34.0)
MCHC: 31.4 g/dL (ref 30.0–36.0)
MCV: 84.9 fL (ref 80.0–100.0)
Monocytes Absolute: 0.3 10*3/uL (ref 0.1–1.0)
Monocytes Relative: 10 %
Neutro Abs: 1.6 10*3/uL — ABNORMAL LOW (ref 1.7–7.7)
Neutrophils Relative %: 52 %
Platelets: 155 10*3/uL (ref 150–400)
RBC: 5.36 MIL/uL (ref 4.22–5.81)
RDW: 14.6 % (ref 11.5–15.5)
WBC: 3 10*3/uL — ABNORMAL LOW (ref 4.0–10.5)
nRBC: 0 % (ref 0.0–0.2)

## 2018-07-22 LAB — VITAMIN B12: Vitamin B-12: 708 pg/mL (ref 180–914)

## 2018-07-22 NOTE — Progress Notes (Signed)
[facility] day:  07/22/2018   Chief Complaint: Jose Perry is a 77 y.o. male with thrombocytopenia and leukopenia who is seen for a 1 month assessment.   HPI:  The patient was last seen in the hematology clinic on 05/14/2018. At that time, patient was doing well. He had no significant complaints. No bleeding. Eating well; weight stable. Exam was unremarkable. Started on oral B12 supplementation daily, with plans to recheck labs in 1 month.   Patient scheduled to have labs rechecked following initial of oral B12 supplementation. He failed to return for the scheduled lab appointment.   He was seen in follow up consult on 06/04/2018 by Dr. John Giovanni (urology). Notes reviewed. No significant LUTS. PSA stable at 2.3 ng/mL. Patient continued on tamsulosin for BPH. Scheduled to follow up in 1 year.   He was seen in consult on 07/10/2018 by Dr. Vernard Gambles (pulmonary medicine). Notes reviewed. Patient seen for worsening shortness of breath, dry cough, and orthopnea. PFTs and echocardiogram were ordered. DDx included asthma, however due to an upcoming eye surgery on 07/25/2018, ophthalmology asked that patient not be started on MDI/SVN medications. Patient to follow up with Dr. Patsey Berthold 3-4 weeks following his eye surgery.   PFTs done on 07/11/2018 revealed severe obstructive pulmonary disease with significant bronchodilator response.   Echocardiogram was done on 07/12/2018 that revealed a LVEF of 60-65%.There was trivial AVR and MVR, and mild TVR noted.   In the interim, patient is doing well overall. He denies any acute concerns today. Breathing is stable at this time. He is having more difficulties with his acid reflux. Patient denies that he has experienced any B symptoms. He denies any interval infections.  No increased bruising or bleeding.   Patient advises that he maintains an adequate appetite. He is eating well. Weight today is 154 lb 4.8  oz (70 kg), which compared to his last visit to the clinic, represents a 3 pound decrease.    Patient denies pain in the clinic today.   Past Medical History:  Diagnosis Date  . Asthma   . Dyspnea   . Elevated blood pressure   . Enlarged prostate   . GERD (gastroesophageal reflux disease)   . Glaucoma   . History of adenomatous polyp of colon   . Hx of colonic polyps   . Hypertension   . Leukopenia    has had an extensive w/up.    . Liver hemangioma 01/24/2018  . Thyroid goiter     Past Surgical History:  Procedure Laterality Date  . BACK SURGERY  2002   ruptured disc  . COLONOSCOPY W/ POLYPECTOMY  04/24/2005, 07/06/2010, 07/08/2014  . COLONOSCOPY WITH PROPOFOL N/A 02/01/2018   Procedure: COLONOSCOPY WITH PROPOFOL;  Surgeon: Manya Silvas, MD;  Location: [facility] ENDOSCOPY;  Service: Endoscopy;  Laterality: N/A;  . ESOPHAGOGASTRODUODENOSCOPY  04/24/2005, 07/06/2010,  . EYE SURGERY  2009   relieve pressure from glaucoma  . THYROID SURGERY  1968   goiter    Family History  Problem Relation Age of Onset  . Heart disease Mother   . Alcohol abuse Father   . Hyperlipidemia Father   . Stroke Father     Social History:  reports that he has never smoked. He has never used smokeless tobacco. He reports that he does not drink alcohol or use drugs.  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  He denies any exposure to radiation or toxins.  The patient is alone today.  Allergies:  Allergies  Allergen Reactions  . Brimonidine Tartrate     Other reaction(s): Eye redness  . Brinzolamide-Brimonidine     Other reaction(s): Eye redness  . Other Other (See Comments)    Simbrinza Eye Drops gives pt Eye redness    Current Medications: Current Outpatient Medications  Medication Sig Dispense Refill  . acetaZOLAMIDE (DIAMOX) 500 MG capsule Take 500 mg by mouth 2 (two) times daily.     Marland Kitchen amLODipine (NORVASC) 5 MG tablet TAKE 1 TABLET BY MOUTH EVERY DAY 90 tablet 1  .  dorzolamide (TRUSOPT) 2 % ophthalmic solution 1 drop two (2) times a day.    . montelukast (SINGULAIR) 10 MG tablet Take 1 tablet (10 mg total) by mouth at bedtime. 30 tablet 1  . omeprazole (PRILOSEC) 40 MG capsule Take 40 mg daily by mouth.     Marland Kitchen SIMBRINZA 1-0.2 % SUSP Place 2 drops daily at 12 noon into the right eye.     . tamsulosin (FLOMAX) 0.4 MG CAPS capsule Take 1 capsule (0.4 mg total) by mouth daily. 90 capsule 3  . vitamin B-12 (CYANOCOBALAMIN) 1000 MCG tablet Take by mouth.    Marland Kitchen albuterol (PROVENTIL HFA;VENTOLIN HFA) 108 (90 Base) MCG/ACT inhaler Inhale 2 puffs into the lungs every 6 (six) hours as needed. (Patient not taking: Reported on 07/22/2018) 1 Inhaler 2  . latanoprost (XALATAN) 0.005 % ophthalmic solution latanoprost 0.005 % eye drops     No current facility-administered medications for this visit.     Review of Systems  Constitutional: Positive for weight loss (down 3 pounds). Negative for diaphoresis, fever and malaise/fatigue.  HENT: Negative.   Eyes: Negative.   Respiratory: Positive for cough (dry) and shortness of breath (exertional). Negative for hemoptysis and sputum production.   Cardiovascular: Positive for orthopnea. Negative for chest pain, palpitations, leg swelling and PND.       LVEF 60-65%.  Gastrointestinal: Positive for heartburn. Negative for abdominal pain, blood in stool, constipation, diarrhea, melena, nausea and vomiting.  Genitourinary: Negative for dysuria, frequency, hematuria and urgency.  Musculoskeletal: Negative for back pain, falls, joint pain and myalgias.  Skin: Negative for itching and rash.  Neurological: Negative for dizziness, tremors, weakness and headaches.  Endo/Heme/Allergies: Does not bruise/bleed easily.  Psychiatric/Behavioral: Negative for depression, memory loss and suicidal ideas. The patient is not nervous/anxious and does not have insomnia.   All other systems reviewed and are negative.  Performance status (ECOG): 0 -  Asymptomatic  Vital Signs BP (!) 147/84 (BP Location: Left Arm, Patient Position: Sitting)   Pulse (!) 48   Temp (!) 95.8 F (35.4 C) (Tympanic)   Resp 18   Wt 154 lb 4.8 oz (70 kg)   BMI 23.12 kg/m   Physical Exam  Constitutional: He is oriented to person, place, and time and well-developed, well-nourished, and in no distress.  HENT:  Head: Normocephalic and atraumatic.  Mouth/Throat: Oropharynx is clear and moist and mucous membranes are normal.  Short dark hair.   Eyes: Pupils are equal, round, and reactive to light. EOM are normal. No scleral icterus.  Brown eyes.   Neck: Normal range of motion. Neck supple. No tracheal deviation present. No thyromegaly present.  Cardiovascular: Normal rate, regular rhythm, normal heart sounds and intact distal pulses. Exam reveals no gallop and no friction rub.  No murmur heard. Pulmonary/Chest: Effort normal and breath sounds normal. No respiratory distress. He has no wheezes. He has no rales.  Abdominal:  Soft. Bowel sounds are normal. He exhibits no distension. There is no tenderness.  Musculoskeletal: Normal range of motion. He exhibits no edema or tenderness.  Neurological: He is alert and oriented to person, place, and time.  Skin: Skin is warm and dry. No rash noted. No erythema.  Psychiatric: Mood, affect and judgment normal.  Nursing note and vitals reviewed.   Appointment on 07/22/2018  Component Date Value Ref Range Status  . WBC 07/22/2018 3.0* 4.0 - 10.5 K/uL Final  . RBC 07/22/2018 5.36  4.22 - 5.81 MIL/uL Final  . Hemoglobin 07/22/2018 14.3  13.0 - 17.0 g/dL Final  . HCT 07/22/2018 45.5  39.0 - 52.0 % Final  . MCV 07/22/2018 84.9  80.0 - 100.0 fL Final  . MCH 07/22/2018 26.7  26.0 - 34.0 pg Final  . MCHC 07/22/2018 31.4  30.0 - 36.0 g/dL Final  . RDW 07/22/2018 14.6  11.5 - 15.5 % Final  . Platelets 07/22/2018 155  150 - 400 K/uL Final  . nRBC 07/22/2018 0.0  0.0 - 0.2 % Final  . Neutrophils Relative % 07/22/2018 52  %  Final  . Neutro Abs 07/22/2018 1.6* 1.7 - 7.7 K/uL Final  . Lymphocytes Relative 07/22/2018 33  % Final  . Lymphs Abs 07/22/2018 1.0  0.7 - 4.0 K/uL Final  . Monocytes Relative 07/22/2018 10  % Final  . Monocytes Absolute 07/22/2018 0.3  0.1 - 1.0 K/uL Final  . Eosinophils Relative 07/22/2018 4  % Final  . Eosinophils Absolute 07/22/2018 0.1  0.0 - 0.5 K/uL Final  . Basophils Relative 07/22/2018 1  % Final  . Basophils Absolute 07/22/2018 0.0  0.0 - 0.1 K/uL Final  . Immature Granulocytes 07/22/2018 0  % Final  . Abs Immature Granulocytes 07/22/2018 0.01  0.00 - 0.07 K/uL Final   Performed at [facility], [address], [place], [place] [address]    Assessment:  Jose Perry is a 77 y.o. male with mild leukopenia and thrombocytopenia.  He has a greater than 30 year history of low counts.  He denies any issues with infections, gingivitis, bruising or bleeding.  He denies any new medications, herbal products, or alcohol.  He denies any family history of blood disorders or autoimmune disease.   CBCs dating back to 03/2014 reveal leukopenia beginning on 08/11/2014.  WBC has ranged between 2600 - 3600 with an isolated WBC 4400 on 08/21/2017.  ANC has been between 1100 - 1800.  He has had mild thrombocytopenia since 03/22/2014.  Platelet count has ranged between 91,000 -131,000.  He has had extensive testing including a bone marrow ([place]) in the distant past.  Work-up was negative per patient (no report available).  Work-up on 05/06/2018 revealed a hematocrit of 41.7, hemoglobin 13.5, MCV 83.9, platelets 156,000, WBC 2,600 with an ANC of 1400.  B12 was 185 (low).  Normal studies included: folate (9.6), TSH, copper, ANA, SPEP, hepatitis B core antibody total, hepatitis C antibody, and HIV testing.  Began oral B12 supplementation on 05/14/2018.   Liver CT on on 07/06/2017 revealed a stable 8.2 x 4.8 cm enhancing mass occupying a large portion of the the LEFT lateral hepatic lobe  (compared to 10/19/2011).  Lesion has imaging characteristics typical and atypical of a benign hemangioma.  Spleen is normal in size.  PFTs on 07/11/2018 revealed severe obstructive pulmonary disease with significant bronchodilator response. Echocardiogram on 07/12/2018 that revealed a LVEF of 60-65%.There was trivial AVR and MVR, and mild TVR noted.  Symptomatically, he is doing well overall.  He notes an increase in his acid reflux symptoms.  Breathing is stable.  He denies any B symptoms or recent infections.  Exam is grossly unremarkable.  Plan: 1. Labs today: CBC with diff, anti-parietal Ab, intrinsic factor Ab, B12 2. Leukopenia and thrombocytopenia  Labs reviewed. WBC 3000 (ANC 1600, ALC 1000).    Platelet count 155,000 3. Vitamin B12 deficiency  Has been on daily oral B12 1,000 mcg supplementation since 05/14/2018.   Check B12 level today.   Check anti-parietal Ab and intrinsic factor Ab to rule out pernicious anemia.  4. Severe obstructive pulmonary disease  Seen in consult by pulmonary medicine.  PFTs reviewed - (+) for severe obstructive pulmonary disease with significant bronchodilator response.   Echocardiogram demonstrated a normal LVEF of 60-65%.   Considering asthma as part of DDx, as patient previously treated for asthma earlier in his life.   Unable to treat with MDI/SVN initially, as patient is scheduled for "glaucoma surgery" on 07/25/2018. Ophthalmology asked that new respiratory medications be deferred until after procedure.   Follow up with Dr. Patsey Berthold 3-4 weeks after eye surgery as already planned.  5. RTC in 3 months for MD assessments and labs (CBC with diff, BMP).    Honor Loh, NP  07/22/2018 , 3:11 PM    I saw and evaluated the patient, participating in the key portions of the service and reviewing pertinent diagnostic studies and records.  I reviewed the nurse practitioner's note and agree with the findings and the plan.  The assessment and plan  were discussed with the patient.  A few questions were asked by the patient and answered.   Nolon Stalls, MD 07/22/2018,3:11 PM

## 2018-07-22 NOTE — Progress Notes (Signed)
Here for follow up per pt having R eye surg for glaucoma on Oct 17th    Per pt " feeling ok "

## 2018-07-23 LAB — INTRINSIC FACTOR ANTIBODIES: Intrinsic Factor: 1 AU/mL (ref 0.0–1.1)

## 2018-07-23 LAB — ANTI-PARIETAL ANTIBODY: Parietal Cell Antibody-IgG: 5.5 Units (ref 0.0–20.0)

## 2018-07-25 DIAGNOSIS — K219 Gastro-esophageal reflux disease without esophagitis: Secondary | ICD-10-CM | POA: Diagnosis not present

## 2018-07-25 DIAGNOSIS — Z86010 Personal history of colonic polyps: Secondary | ICD-10-CM | POA: Diagnosis not present

## 2018-07-25 DIAGNOSIS — H401112 Primary open-angle glaucoma, right eye, moderate stage: Secondary | ICD-10-CM | POA: Diagnosis not present

## 2018-07-25 DIAGNOSIS — J45909 Unspecified asthma, uncomplicated: Secondary | ICD-10-CM | POA: Diagnosis not present

## 2018-07-27 ENCOUNTER — Emergency Department
Admission: EM | Admit: 2018-07-27 | Discharge: 2018-07-27 | Disposition: A | Discharge status: Home / Self Care | Payer: Medicare Other | Attending: Emergency Medicine | Admitting: Emergency Medicine

## 2018-07-27 ENCOUNTER — Encounter: Payer: Self-pay | Admitting: Emergency Medicine

## 2018-07-27 ENCOUNTER — Other Ambulatory Visit: Payer: Self-pay

## 2018-07-27 DIAGNOSIS — R339 Retention of urine, unspecified: Secondary | ICD-10-CM | POA: Diagnosis not present

## 2018-07-27 DIAGNOSIS — J45901 Unspecified asthma with (acute) exacerbation: Secondary | ICD-10-CM | POA: Diagnosis not present

## 2018-07-27 DIAGNOSIS — I10 Essential (primary) hypertension: Secondary | ICD-10-CM | POA: Insufficient documentation

## 2018-07-27 DIAGNOSIS — R338 Other retention of urine: Secondary | ICD-10-CM

## 2018-07-27 LAB — URINALYSIS, COMPLETE (UACMP) WITH MICROSCOPIC
BACTERIA UA: NONE SEEN
Bilirubin Urine: NEGATIVE
GLUCOSE, UA: NEGATIVE mg/dL
Ketones, ur: NEGATIVE mg/dL
Leukocytes, UA: NEGATIVE
NITRITE: NEGATIVE
PH: 5 (ref 5.0–8.0)
Protein, ur: NEGATIVE mg/dL
SPECIFIC GRAVITY, URINE: 1.008 (ref 1.005–1.030)
Squamous Epithelial / LPF: NONE SEEN (ref 0–5)
WBC, UA: NONE SEEN WBC/hpf (ref 0–5)

## 2018-07-27 LAB — CBC
HCT: 43.9 % (ref 39.0–52.0)
Hemoglobin: 14.1 g/dL (ref 13.0–17.0)
MCH: 27.3 pg (ref 26.0–34.0)
MCHC: 32.1 g/dL (ref 30.0–36.0)
MCV: 84.9 fL (ref 80.0–100.0)
PLATELETS: 151 10*3/uL (ref 150–400)
RBC: 5.17 MIL/uL (ref 4.22–5.81)
RDW: 14.4 % (ref 11.5–15.5)
WBC: 4.4 10*3/uL (ref 4.0–10.5)
nRBC: 0 % (ref 0.0–0.2)

## 2018-07-27 LAB — BASIC METABOLIC PANEL
ANION GAP: 7 (ref 5–15)
BUN: 12 mg/dL (ref 8–23)
CALCIUM: 10.3 mg/dL (ref 8.9–10.3)
CO2: 22 mmol/L (ref 22–32)
Chloride: 108 mmol/L (ref 98–111)
Creatinine, Ser: 1.39 mg/dL — ABNORMAL HIGH (ref 0.61–1.24)
GFR calc Af Amer: 55 mL/min — ABNORMAL LOW (ref >60)
GFR calc non Af Amer: 48 mL/min — ABNORMAL LOW (ref >60)
GLUCOSE: 103 mg/dL — AB (ref 70–99)
Potassium: 3.4 mmol/L — ABNORMAL LOW (ref 3.5–5.1)
Sodium: 137 mmol/L (ref 135–145)

## 2018-07-27 MED ORDER — TRAMADOL HCL 50 MG PO TABS: 50.0000 mg | ORAL_TABLET | Freq: Four times a day (QID) | ORAL | 0 refills | Status: DC | PRN

## 2018-07-27 MED ORDER — LIDOCAINE HCL URETHRAL/MUCOSAL 2 % EX GEL
1.0000 "application " | Freq: Once | CUTANEOUS | Status: AC
  Administered 2018-07-27: 1 via URETHRAL
  Filled 2018-07-27: qty 10

## 2018-07-27 MED ORDER — CEPHALEXIN 250 MG PO CAPS: 250.0000 mg | ORAL_CAPSULE | Freq: Four times a day (QID) | ORAL | 0 refills | Status: AC

## 2018-07-27 MED ORDER — CEPHALEXIN 250 MG PO CAPS: 250.0000 mg | ORAL_CAPSULE | Freq: Four times a day (QID) | ORAL | 0 refills | Status: DC

## 2018-07-27 NOTE — ED Notes (Signed)
Pt drainage bag changed to leg bag. This RN instructed pt on how to care for leg bag and also reviewed prescriptions and follow up care. Pt verbally understood understanding.

## 2018-07-27 NOTE — ED Triage Notes (Signed)
Pt has had urinary retention since surgery for eye on Thursday.  Reports drinks a lot of fluids but has just been dribbling. Does have prostate enlargement. No fevers.  VSS. Appears uncomfortable.

## 2018-07-27 NOTE — ED Provider Notes (Signed)
[facility] Emergency Department Provider Note       Time seen: ----------------------------------------- 11:28 AM on 07/27/2018 -----------------------------------------   I have reviewed the triage vital signs and the nursing notes.  HISTORY   Chief Complaint Urinary Retention    HPI Jose Perry is a 77 y.o. male with a history of asthma, GERD, hypertension, leukopenia who presents to the ED for urinary retention since surgery for his eye on Thursday.  Patient reports he drinks a lot of fluid but has just been dribbling urine.  He does have a history of prostate enlargement.  Past Medical History:  Diagnosis Date  . Asthma   . Dyspnea   . Elevated blood pressure   . Enlarged prostate   . GERD (gastroesophageal reflux disease)   . Glaucoma   . History of adenomatous polyp of colon   . Hx of colonic polyps   . Hypertension   . Leukopenia    has had an extensive w/up.    . Liver hemangioma 01/24/2018  . Thyroid goiter     Patient Active Problem List   Diagnosis Date Noted  . Hyperplasia of prostate with lower urinary tract symptoms (LUTS) 07/10/2018  . Exotropia 07/10/2018  . Blepharoconjunctivitis 07/10/2018  . Primary open angle glaucoma (POAG) 06/28/2018  . B12 deficiency 05/14/2018  . Liver hemangioma 01/24/2018  . Thrombocytopenia ([place]) 12/19/2017  . Change in stool 10/12/2017  . Asthma 08/30/2017  . Lung nodule 08/30/2017  . Urinary urgency 05/23/2017  . Thyroid nodule 06/13/2016  . Primary open angle glaucoma (POAG) of right eye, moderate stage 04/10/2016  . Shortness of breath 08/30/2015  . Bruit 06/14/2015  . Health care maintenance 12/13/2014  . Glaucoma 09/21/2014  . Goiter 06/28/2014  . Hypertension 06/28/2014  . Leukopenia 06/28/2014  . History of colonic polyps 06/28/2014  . Enlarged prostate 06/28/2014  . GERD (gastroesophageal reflux disease) 06/28/2014  . History of adenomatous polyp of colon 06/26/2014  .  Gastrointestinal ulcer due to Helicobacter pylori 24/26/8341  . Male erectile dysfunction, unspecified 07/24/2012  . Incomplete emptying of bladder 07/24/2012  . Elevated prostate specific antigen (PSA) 07/24/2012  . Benign localized hyperplasia of prostate with urinary obstruction 07/24/2012    Past Surgical History:  Procedure Laterality Date  . BACK SURGERY  2002   ruptured disc  . COLONOSCOPY W/ POLYPECTOMY  04/24/2005, 07/06/2010, 07/08/2014  . COLONOSCOPY WITH PROPOFOL N/A 02/01/2018   Procedure: COLONOSCOPY WITH PROPOFOL;  Surgeon: Manya Silvas, MD;  Location: [facility] ENDOSCOPY;  Service: Endoscopy;  Laterality: N/A;  . ESOPHAGOGASTRODUODENOSCOPY  04/24/2005, 07/06/2010,  . EYE SURGERY  2009   relieve pressure from glaucoma  . THYROID SURGERY  1968   goiter    Allergies Brimonidine tartrate; Brinzolamide-brimonidine; and Other  Social History Social History   Tobacco Use  . Smoking status: Never Smoker  . Smokeless tobacco: Never Used  Substance Use Topics  . Alcohol use: No    Alcohol/week: 0.0 standard drinks  . Drug use: No   Review of Systems Constitutional: Negative for fever. Cardiovascular: Negative for chest pain. Respiratory: Negative for shortness of breath. Gastrointestinal: Positive for abdominal pain Genitourinary: Positive for urinary hesitancy and urgency Musculoskeletal: Negative for back pain. Skin: Negative for rash. Neurological: Negative for headaches, focal weakness or numbness.  All systems negative/normal/unremarkable except as stated in the HPI  ____________________________________________   PHYSICAL EXAM:  VITAL SIGNS: ED Triage Vitals  Enc Vitals Group     BP 07/27/18 1116 (!) 152/87  Pulse Rate 07/27/18 1120 64     Resp 07/27/18 1116 20     Temp 07/27/18 1116 97.6 F (36.4 C)     Temp Source 07/27/18 1116 Oral     SpO2 07/27/18 1120 95 %     Weight 07/27/18 1119 154 lb 4.8 oz (70 kg)     Height 07/27/18 1119 5' 8.5" (1.74  m)     Head Circumference --      Peak Flow --      Pain Score 07/27/18 1119 0     Pain Loc --      Pain Edu? --      Excl. in [place]? --    Constitutional: Alert and oriented.  Mild distress Cardiovascular: Normal rate, regular rhythm. No murmurs, rubs, or gallops. Respiratory: Normal respiratory effort without tachypnea nor retractions. Breath sounds are clear and equal bilaterally. No wheezes/rales/rhonchi. Gastrointestinal: Lower abdominal distention, likely bladder enlargement Musculoskeletal: Nontender with normal range of motion in extremities. No lower extremity tenderness nor edema. Neurologic:  Normal speech and language. No gross focal neurologic deficits are appreciated.  Skin:  Skin is warm, dry and intact. No rash noted. Psychiatric: Mood and affect are normal. Speech and behavior are normal.  ____________________________________________  ED COURSE:  As part of my medical decision making, I reviewed the following data within the [place] History obtained from family if available, nursing notes, old chart and ekg, as well as notes from prior ED visits. Patient presented for urinary retention, we will assess with labs and imaging as indicated at this time.   Procedures ____________________________________________   LABS (pertinent positives/negatives)  Labs Reviewed  BASIC METABOLIC PANEL - Abnormal; Notable for the following components:      Result Value   Potassium 3.4 (*)    Glucose, Bld 103 (*)    Creatinine, Ser 1.39 (*)    GFR calc non Af Amer 48 (*)    GFR calc Af Amer 55 (*)    All other components within normal limits  URINALYSIS, COMPLETE (UACMP) WITH MICROSCOPIC - Abnormal; Notable for the following components:   Color, Urine YELLOW (*)    APPearance CLEAR (*)    Hgb urine dipstick SMALL (*)    All other components within normal limits  URINE CULTURE  CBC    RADIOLOGY Bladder scan  Bladder scan reveals around 1 L of fluid retained  in the bladder  ____________________________________________  DIFFERENTIAL DIAGNOSIS   Urinary retention, prostatic enlargement, UTI, medication side effect  FINAL ASSESSMENT AND PLAN  Acute urinary retention   Plan: The patient had presented for acute urinary retention. Patient's labs did reveal a slight increase in his creatinine which is likely due to his urinary retention. Patient's imaging again revealed urinary retention which was relieved with a Foley catheter which he tolerated well.  He will be discharged home with similar.  He is cleared for outpatient follow-up.   Laurence Aly, MD   Note: This note was generated in part or whole with voice recognition software. Voice recognition is usually quite accurate but there are transcription errors that can and very often do occur. I apologize for any typographical errors that were not detected and corrected.     Earleen Newport, MD 07/27/18 [phone]

## 2018-07-29 LAB — URINE CULTURE: CULTURE: NO GROWTH

## 2018-07-31 ENCOUNTER — Ambulatory Visit (INDEPENDENT_AMBULATORY_CARE_PROVIDER_SITE_OTHER): Payer: Medicare Other | Admitting: Pulmonary Disease

## 2018-07-31 ENCOUNTER — Encounter: Payer: Self-pay | Admitting: Pulmonary Disease

## 2018-07-31 ENCOUNTER — Ambulatory Visit (INDEPENDENT_AMBULATORY_CARE_PROVIDER_SITE_OTHER): Payer: Medicare Other | Admitting: Family Medicine

## 2018-07-31 VITALS — BP 140/82 | HR 62 | Ht 68.5 in | Wt 161.0 lb

## 2018-07-31 DIAGNOSIS — R0602 Shortness of breath: Secondary | ICD-10-CM

## 2018-07-31 DIAGNOSIS — R339 Retention of urine, unspecified: Secondary | ICD-10-CM

## 2018-07-31 DIAGNOSIS — R338 Other retention of urine: Secondary | ICD-10-CM | POA: Diagnosis not present

## 2018-07-31 DIAGNOSIS — J4550 Severe persistent asthma, uncomplicated: Secondary | ICD-10-CM | POA: Diagnosis not present

## 2018-07-31 DIAGNOSIS — N9989 Other postprocedural complications and disorders of genitourinary system: Secondary | ICD-10-CM

## 2018-07-31 DIAGNOSIS — Z23 Encounter for immunization: Secondary | ICD-10-CM | POA: Diagnosis not present

## 2018-07-31 MED ORDER — FLUTICASONE FUROATE-VILANTEROL 100-25 MCG/INH IN AEPB: 1.0000 | INHALATION_SPRAY | Freq: Every day | RESPIRATORY_TRACT | 0 refills | Status: DC

## 2018-07-31 MED ORDER — FLUTICASONE FUROATE-VILANTEROL 100-25 MCG/INH IN AEPB: 1.0000 | INHALATION_SPRAY | Freq: Every day | RESPIRATORY_TRACT | 2 refills | Status: DC

## 2018-07-31 NOTE — Progress Notes (Signed)
Subjective:    Patient ID: Jose Perry, male    DOB: 05-12-1941, 77 y.o.   MRN: [id number]  HPI Patient is a 77 year old lifelong never smoker who presents for follow-up of shortness of breath. The patient states that he has had the symptoms for approximately one year to a year and a half. He notes that he presented with the sensation of chest tightness in November 2018 to the emergency room at [facility], he had a chest CT formed at that time which I have independently reviewed and showed no evidence of pulmonary pathology or pulmonary emboli. The study was benign. The patient has noted ongoing issues with tightness feeling that he cannot take in nor let air out. Activity aggravates these issues. He also notices a dry cough on occasion. He has also noted that he occasionally has shoulder pain and arm pain particularly on the left when these symptoms present. He has not noticed diaphoresis. He does have significant orthopnea and has to sleep propped up. He does note acid reflux if he is not propped up. He has not had any fevers, chills or sweats. As noted, cough has been nonproductive, no hemoptysis. He has not had a recent cardiac evaluation.  He recently was given a trial of albuterol which initially helped did quit working however he states that he recently has started to be effective again. He was started on montelukast and he feels that this may have helped some with his symptoms but it has not been optimal. States that he had asthma as a child but was treated with "injections", he states he outgrew it. He had glaucoma surgery on 17 October and he tolerated that well however, he did have issues with urinary retention afterwards and now is wearing a Foley catheter. He was started on Flomax.  His initial visit here was on 2 October. We requested complete pulmonary function testing. Shows that the patient has an FEV1 of 0.77 L or 31% predicted with an FVC of 1.52 L or 47% predicted. The patient has  significant bronchodilator response with a net change of 40% increase in FEV1 after use of bronchodilator. This is consistent with severe persistent asthma poorly controlled. Also has evidence of air trapping but not of hyperinflation. It was performed on 3 October. The patient also had 2D Echo performed on 4 October which showed normal ejection fraction on the left he does have some LVH. It was mild dilation of the left atrium and also mild dilation of the right ventricle. May indicate mild diastolic dysfunction and potential issues with developing cor pulmonale.  Review of Systems  Constitutional: Negative.   HENT: Negative.   Eyes: Negative.   Respiratory: Positive for chest tightness and shortness of breath.   Gastrointestinal: Negative.   Endocrine: Negative.   Genitourinary: Positive for difficulty urinating.  All other systems reviewed and are negative.      Objective:   Physical Exam  Constitutional: He is oriented to person, place, and time. He appears well-developed.  Non-toxic appearance. He does not appear ill. No distress.  Thin gentleman in no acute distress.  HENT:  Head: Normocephalic and atraumatic.  Mouth/Throat: Oropharynx is clear and moist. No oropharyngeal exudate.  Eyes: Pupils are equal, round, and reactive to light.  Status post glaucoma surgery  Neck: Neck supple. No JVD present. No tracheal deviation present. No thyromegaly present.  Cardiovascular: Normal rate, regular rhythm, normal heart sounds and intact distal pulses.  No murmur heard. Pulmonary/Chest: Effort normal. He has  no wheezes. He has no rhonchi. He has no rales.  Distant breath sounds diffusely.  Abdominal: Soft. He exhibits no distension.  Genitourinary:  Genitourinary Comments: He has an indwelling Foley catheter.  Musculoskeletal: Normal range of motion.       Right lower leg: He exhibits no edema.       Left lower leg: He exhibits no edema.  Lymphadenopathy:    He has no cervical  adenopathy.  Neurological: He is alert and oriented to person, place, and time.  No focal deficits   Skin: Skin is warm and dry. No cyanosis. Nails show no clubbing.  Psychiatric: He has a normal mood and affect. His behavior is normal.          Assessment & Plan:   1) Poorly Controlled severe persistent asthma: the patient appears to have had asthma of long-standing and unfortunately not treated optimally. Asthmatics are notorious for minimizing their symptoms. It appears that he now has incurred in some airway remodeling with some COPD features (i.e. no complete reversibility with bronchodilator) however, THIS PATIENT DOES NOT HAVE COPD. We will institute treatment with Symbicort 160/4.5, two inhalations twice a day. The patient was shown how to use the inhaler and was provided a spacer for use. Hopefully this will help with reducing inflammatory response. We should repeat spirometry in approximately three months time. For now I would continue Singulair as well. Hopefully management with these two medications will help with his dyspnea.  2) Shortness of Breath/chest tightness:  2D Echo was essentially normal with the exception of mild left ventricular hypertrophy. He may have a mild element of diastolic dysfunction there is dilation of the right ventricle which may indicate early cor pulmonale. Suspect that his chest tightness has been due to poorly controlled asthma. The patient does some subtle abnormalities on echo that may indicate issues with nocturnal hypoxemia, will check overnight oximetry.  3) Glaucoma: this issue adds complexity to his management particularly with regards to medications chosen for potential asthma. Avoid LAMAs which are not located for asthma as these can aggravate glaucoma and urinary retention which he already has had issues with.The use of LAMAs in asthma is limited.  4) Urinary retention: another reason to avoid LAMAs. This is being followed by urology  5)  Influenza vaccine was provided for the patient today.

## 2018-07-31 NOTE — Progress Notes (Signed)
Patient presents today with pain from catheter. I adjusted the catheter and changed to night bag. Patient felt much better and will return on Friday to have catheter removed for a TOV.

## 2018-07-31 NOTE — Patient Instructions (Signed)
1) you will be placed on Brio Ellipta 100/25 one inhalation daily.  2) Rinse your mouth well after use of Breo.  3) We will also check your oxygen level at nighttime will let you know if you need oxygen at nighttime.

## 2018-07-31 NOTE — Progress Notes (Signed)
re

## 2018-08-02 ENCOUNTER — Ambulatory Visit (INDEPENDENT_AMBULATORY_CARE_PROVIDER_SITE_OTHER): Payer: Medicare Other | Admitting: Family Medicine

## 2018-08-02 ENCOUNTER — Ambulatory Visit: Payer: Medicare Other

## 2018-08-02 DIAGNOSIS — R339 Retention of urine, unspecified: Secondary | ICD-10-CM

## 2018-08-02 LAB — BLADDER SCAN AMB NON-IMAGING: Scan Result: 560

## 2018-08-02 NOTE — Progress Notes (Signed)
Catheter Removal  Patient is present today for a catheter removal.  65ml of water was drained from the balloon. A 16FR foley cath was removed from the bladder no complications were noted . Patient tolerated well.  Preformed by: Elberta Leatherwood, CMA  Follow up/ Additional notes: Return today at 2:00pm for PVR  Bladder Scan Patient can not void: 560 ml Performed By: Elberta Leatherwood, CMA  Cath Replacement   Patient was cleaned and prepped in a sterile fashion with betadine and 2% lidocaine jelly was instilled into the urethra. A 16 FR foley cath was replaced into the bladder no complications were noted Urine return was noted 563ml and urine was yellow in color. The balloon was filled with 71ml of sterile water. A leg bag was attached for drainage.  A night bag was also given to the patient and patient was given instruction on how to change from one bag to another. Patient was given proper instruction on catheter care.    Preformed by: Elberta Leatherwood, CMA  Follow up: 1 week TOV

## 2018-08-05 ENCOUNTER — Ambulatory Visit (INDEPENDENT_AMBULATORY_CARE_PROVIDER_SITE_OTHER): Payer: Medicare Other | Admitting: Family Medicine

## 2018-08-05 ENCOUNTER — Other Ambulatory Visit: Payer: Self-pay | Admitting: Family Medicine

## 2018-08-05 DIAGNOSIS — R339 Retention of urine, unspecified: Secondary | ICD-10-CM

## 2018-08-05 NOTE — Progress Notes (Signed)
Patient presents today with complaints of his catheter pulling on his penis. I adjusted the catheter and placed a new strap on the leg and patient felt much better. He will be back for cath removal on Friday.

## 2018-08-09 ENCOUNTER — Ambulatory Visit (INDEPENDENT_AMBULATORY_CARE_PROVIDER_SITE_OTHER): Payer: Medicare Other | Admitting: Family Medicine

## 2018-08-09 DIAGNOSIS — R339 Retention of urine, unspecified: Secondary | ICD-10-CM

## 2018-08-09 NOTE — Progress Notes (Signed)
Catheter Removal  Patient is present today for a catheter removal.  50ml of water was drained from the balloon. A 16FR foley cath was removed from the bladder no complications were noted . Patient tolerated well.  Preformed by: Elberta Leatherwood, CMA  Follow up/ Additional notes: At 2:30 for PVR

## 2018-08-09 NOTE — Progress Notes (Signed)
Bladder Scan Patient can void: 135 ml Performed By: Fonnie Jarvis, CMA  Patient told to follow up with Zara Council, PAC in 1-2 weeks for a follow up w/PVR

## 2018-08-12 DIAGNOSIS — R0602 Shortness of breath: Secondary | ICD-10-CM | POA: Diagnosis not present

## 2018-08-15 ENCOUNTER — Telehealth: Payer: Self-pay

## 2018-08-15 NOTE — Telephone Encounter (Signed)
Spoke with patient with results of ONO. No oxygen required at this time. Patient aware with no further questions.

## 2018-08-21 ENCOUNTER — Ambulatory Visit: Payer: Medicare Other

## 2018-08-22 ENCOUNTER — Ambulatory Visit (INDEPENDENT_AMBULATORY_CARE_PROVIDER_SITE_OTHER): Payer: Medicare Other | Admitting: Urology

## 2018-08-22 ENCOUNTER — Encounter: Payer: Self-pay | Admitting: Urology

## 2018-08-22 VITALS — BP 159/82 | HR 76 | Ht 68.5 in | Wt 159.8 lb

## 2018-08-22 DIAGNOSIS — N138 Other obstructive and reflux uropathy: Secondary | ICD-10-CM

## 2018-08-22 DIAGNOSIS — Z87898 Personal history of other specified conditions: Secondary | ICD-10-CM

## 2018-08-22 DIAGNOSIS — N401 Enlarged prostate with lower urinary tract symptoms: Secondary | ICD-10-CM | POA: Diagnosis not present

## 2018-08-22 LAB — BLADDER SCAN AMB NON-IMAGING

## 2018-08-22 NOTE — Progress Notes (Addendum)
08/22/2018 4:35 PM   Jose Perry August 28, 1941 [id number]  Referring provider: Einar Pheasant, [address] [place], [place] [address]  Chief Complaint  Patient presents with   Follow-up    1 week    HPI: Jose Perry is 77 yo M who returns today for a follow up after an episode of urinary retention.   He was last seen in our office on 06/04/2018 by Dr. Bernardo Heater to establish local urological care.  He is a former patient of Dr. Bjorn Loser.  He was given a refill for tamsulosin and instructed to return in one year.  He was seen in the ED on 07/27/2018 for urinary retention after having eye surgery.  Foley was placed.  Creatinine was 1.39.  He presented to our office on 08/02/2018 and had the Foley to removed.  He then returned to the office and was found to have 560 mL in his bladder and had the Foley replaced.    He came back on 08/09/2018 and had the Foley removed for another TOV.  He came back to the office for a PVR which was 135 mL.   Since then he has had no issues.    Today, he has not urinary complaints.  Patient denies any gross hematuria, dysuria or suprapubic/flank pain.  Patient denies any fevers, chills, nausea or vomiting.   His PVR is 108 mL.    PMH: Past Medical History:  Diagnosis Date   Asthma    Dyspnea    Elevated blood pressure    Enlarged prostate    GERD (gastroesophageal reflux disease)    Glaucoma    History of adenomatous polyp of colon    Hx of colonic polyps    Hypertension    Leukopenia    has had an extensive w/up.     Liver hemangioma 01/24/2018   Thyroid goiter     Surgical History: Past Surgical History:  Procedure Laterality Date   BACK SURGERY  2002   ruptured disc   COLONOSCOPY W/ POLYPECTOMY  04/24/2005, 07/06/2010, 07/08/2014   COLONOSCOPY WITH PROPOFOL N/A 02/01/2018   Procedure: COLONOSCOPY WITH PROPOFOL;  Surgeon: Manya Silvas, MD;  Location[facility] ENDOSCOPY;  Service: Endoscopy;   Laterality: N/A;   ESOPHAGOGASTRODUODENOSCOPY  04/24/2005, 07/06/2010,   EYE SURGERY  2009   relieve pressure from glaucoma   THYROID SURGERY  1968   goiter    Home Medications:  Allergies as of 08/22/2018      Reactions   Brimonidine Tartrate    Other reaction(s): Eye redness   Brinzolamide-brimonidine    Other reaction(s): Eye redness   Other Other (See Comments)   Simbrinza Eye Drops gives pt Eye redness      Medication List        Accurate as of 08/22/18  4:35 PM. Always use your most recent med list.          acetaZOLAMIDE 500 MG capsule Commonly known as:  DIAMOX Take 500 mg by mouth 2 (two) times daily.   albuterol 108 (90 Base) MCG/ACT inhaler Commonly known as:  PROVENTIL HFA;VENTOLIN HFA TAKE 2 PUFFS BY MOUTH EVERY 6 HOURS AS NEEDED   amLODipine 5 MG tablet Commonly known as:  NORVASC TAKE 1 TABLET BY MOUTH EVERY DAY   fluticasone furoate-vilanterol 100-25 MCG/INH Aepb Commonly known as:  BREO ELLIPTA Inhale 1 puff into the lungs daily.   latanoprost 0.005 % ophthalmic solution Commonly known as:  XALATAN latanoprost 0.005 % eye drops  montelukast 10 MG tablet Commonly known as:  SINGULAIR Take 1 tablet (10 mg total) by mouth at bedtime.   omeprazole 40 MG capsule Commonly known as:  PRILOSEC Take 40 mg daily by mouth.   tamsulosin 0.4 MG Caps capsule Commonly known as:  FLOMAX Take 1 capsule (0.4 mg total) by mouth daily.   vitamin B-12 1000 MCG tablet Commonly known as:  CYANOCOBALAMIN Take by mouth.       Allergies:  Allergies  Allergen Reactions   Brimonidine Tartrate     Other reaction(s): Eye redness   Brinzolamide-Brimonidine     Other reaction(s): Eye redness   Other Other (See Comments)    Simbrinza Eye Drops gives pt Eye redness    Family History: Family History  Problem Relation Age of Onset   Heart disease Mother    Alcohol abuse Father    Hyperlipidemia Father    Stroke Father     Social History:   reports that he has never smoked. He has never used smokeless tobacco. He reports that he does not drink alcohol or use drugs.  ROS: UROLOGY Frequent Urination?: No Hard to postpone urination?: No Burning/pain with urination?: No Get up at night to urinate?: No Leakage of urine?: No Urine stream starts and stops?: No Trouble starting stream?: No Do you have to strain to urinate?: No Blood in urine?: No Urinary tract infection?: No Sexually transmitted disease?: No Injury to kidneys or bladder?: No Painful intercourse?: No Weak stream?: No Erection problems?: No Penile pain?: No  Gastrointestinal Nausea?: No Vomiting?: No Indigestion/heartburn?: No Diarrhea?: No Constipation?: No  Constitutional Fever: No Night sweats?: No Weight loss?: No Fatigue?: No  Skin Itching?: No  Eyes Blurred vision?: No Double vision?: No  Ears/Nose/Throat Sore throat?: No Sinus problems?: No  Hematologic/Lymphatic Swollen glands?: No Easy bruising?: No  Cardiovascular Leg swelling?: No Chest pain?: No  Respiratory Cough?: No Shortness of breath?: No     Musculoskeletal Back pain?: No Joint pain?: No  Neurological Headaches?: No Dizziness?: No  Psychologic Depression?: No Anxiety?: No  Physical Exam: BP (!) 159/82 (BP Location: Left Arm, Patient Position: Sitting, Cuff Size: Normal)    Pulse 76    Ht 5' 8.5" (1.74 m)    Wt 159 lb 12.8 oz (72.5 kg)    BMI 23.94 kg/m   Constitutional: Well nourished. Alert and oriented, No acute distress. HEENT: [place] AT, moist mucus membranes. Trachea midline, no masses. Cardiovascular: No clubbing, cyanosis, or edema. Respiratory: Normal respiratory effort, no increased work of breathing. Skin: No rashes, bruises or suspicious lesions. Neurologic: Grossly intact, no focal deficits, moving all 4 extremities. Psychiatric: Normal mood and affect.  Laboratory Data: Lab Results  Component Value Date   WBC 4.4 07/27/2018   HGB  14.1 07/27/2018   HCT 43.9 07/27/2018   MCV 84.9 07/27/2018   PLT 151 07/27/2018    Lab Results  Component Value Date   CREATININE 1.39 (H) 07/27/2018    Lab Results  Component Value Date   PSA 2.75 08/11/2014    No results found for: TESTOSTERONE  No results found for: HGBA1C  Urinalysis    Component Value Date/Time   COLORURINE YELLOW (A) 07/27/2018 1125   APPEARANCEUR CLEAR (A) 07/27/2018 1125   APPEARANCEUR Clear 04/16/2014 1923   LABSPEC 1.008 07/27/2018 1125   LABSPEC 1.002 04/16/2014 1923   PHURINE 5.0 07/27/2018 1125   GLUCOSEU NEGATIVE 07/27/2018 1125   GLUCOSEU Negative 04/16/2014 1923   HGBUR SMALL (A) 07/27/2018 1125  BILIRUBINUR NEGATIVE 07/27/2018 1125   BILIRUBINUR negative 05/23/2017 0957   BILIRUBINUR Negative 04/16/2014 1923   KETONESUR NEGATIVE 07/27/2018 1125   PROTEINUR NEGATIVE 07/27/2018 1125   UROBILINOGEN 0.2 05/23/2017 0957   NITRITE NEGATIVE 07/27/2018 1125   LEUKOCYTESUR NEGATIVE 07/27/2018 1125   LEUKOCYTESUR Negative 04/16/2014 1923    Lab Results  Component Value Date   BACTERIA NONE SEEN 07/27/2018    Pertinent Imaging: Results for VYRON, FRONCZAK (MRN [id number]) as of 09/05/2018 21:08  Ref. Range 08/22/2018 15:32  Scan Result Unknown 108 ml    Assessment & Plan:    1. History of urinary retention Voiding well Continue tamsulosin  Creatinine rechecked today RTC on 06/04/2019 with Dr. Johnney Killian the office if experiencing difficulty with urination   Return for follow up with Dr. Bernardo Heater in August for one year follow up .  Zara Council, PA-C  [facility] [address], [place], [place] [address] [phone]

## 2018-08-23 ENCOUNTER — Ambulatory Visit (INDEPENDENT_AMBULATORY_CARE_PROVIDER_SITE_OTHER): Payer: Medicare Other

## 2018-08-23 VITALS — BP 142/78 | HR 71 | Temp 97.6°F | Resp 16 | Ht 70.0 in | Wt 162.8 lb

## 2018-08-23 DIAGNOSIS — Z0000 Encounter for general adult medical examination without abnormal findings: Secondary | ICD-10-CM

## 2018-08-23 NOTE — Progress Notes (Signed)
Subjective:   Jose Perry is a 77 y.o. male who presents for Medicare Annual/Subsequent preventive examination.  Review of Systems:  No ROS.  Medicare Wellness Visit. Additional risk factors are reflected in the social history. Cardiac Risk Factors include: advanced age (>97men, >30 women);male gender;hypertension     Objective:    Vitals: BP (!) 142/78 (BP Location: Left Arm, Patient Position: Sitting, Cuff Size: Normal)   Pulse 71   Temp 97.6 F (36.4 C) (Oral)   Resp 16   Ht 5\' 10"  (1.778 m)   Wt 162 lb 12.8 oz (73.8 kg)   SpO2 96%   BMI 23.36 kg/m   Body mass index is 23.36 kg/m.  Advanced Directives 08/23/2018 07/27/2018 07/22/2018 05/14/2018 05/06/2018 02/01/2018 08/21/2017  Does Patient Have a Medical Advance Directive? No No No No No No No  Does patient want to make changes to medical advance directive? No - Patient declined - - - - - -  Would patient like information on creating a medical advance directive? - - No - Patient declined - - - -    Tobacco Social History   Tobacco Use  Smoking Status Never Smoker  Smokeless Tobacco Never Used     Counseling given: Not Answered   Clinical Intake:  Pre-visit preparation completed: Yes  Pain : No/denies pain     Nutritional Status: BMI of 19-24  Normal Diabetes: No  How often do you need to have someone help you when you read instructions, pamphlets, or other written materials from your doctor or pharmacy?: 1 - Never  Interpreter Needed?: No     Past Medical History:  Diagnosis Date  . Asthma   . Dyspnea   . Elevated blood pressure   . Enlarged prostate   . GERD (gastroesophageal reflux disease)   . Glaucoma   . History of adenomatous polyp of colon   . Hx of colonic polyps   . Hypertension   . Leukopenia    has had an extensive w/up.    . Liver hemangioma 01/24/2018  . Thyroid goiter    Past Surgical History:  Procedure Laterality Date  . BACK SURGERY  2002   ruptured disc  .  COLONOSCOPY W/ POLYPECTOMY  04/24/2005, 07/06/2010, 07/08/2014  . COLONOSCOPY WITH PROPOFOL N/A 02/01/2018   Procedure: COLONOSCOPY WITH PROPOFOL;  Surgeon: Manya Silvas, MD;  Location: [facility] ENDOSCOPY;  Service: Endoscopy;  Laterality: N/A;  . ESOPHAGOGASTRODUODENOSCOPY  04/24/2005, 07/06/2010,  . EYE SURGERY  2009   relieve pressure from glaucoma  . THYROID SURGERY  1968   goiter   Family History  Problem Relation Age of Onset  . Heart disease Mother   . Alcohol abuse Father   . Hyperlipidemia Father   . Stroke Father    Social History   Socioeconomic History  . Marital status: Married    Spouse name: Not on file  . Number of children: Not on file  . Years of education: Not on file  . Highest education level: Not on file  Occupational History  . Not on file  Social Needs  . Financial resource strain: Not hard at all  . Food insecurity:    Worry: Never true    Inability: Never true  . Transportation needs:    Medical: No    Non-medical: No  Tobacco Use  . Smoking status: Never Smoker  . Smokeless tobacco: Never Used  Substance and Sexual Activity  . Alcohol use: No    Alcohol/week: 0.0 standard  drinks  . Drug use: No  . Sexual activity: Yes  Lifestyle  . Physical activity:    Days per week: 7 days    Minutes per session: 60 min  . Stress: Not at all  Relationships  . Social connections:    Talks on phone: Not on file    Gets together: Not on file    Attends religious service: Not on file    Active member of club or organization: Not on file    Attends meetings of clubs or organizations: Not on file    Relationship status: Married  Other Topics Concern  . Not on file  Social History Narrative  . Not on file    Outpatient Encounter Medications as of 08/23/2018  Medication Sig  . albuterol (PROVENTIL HFA;VENTOLIN HFA) 108 (90 Base) MCG/ACT inhaler TAKE 2 PUFFS BY MOUTH EVERY 6 HOURS AS NEEDED  . amLODipine (NORVASC) 5 MG tablet TAKE 1 TABLET BY MOUTH EVERY  DAY  . fluticasone furoate-vilanterol (BREO ELLIPTA) 100-25 MCG/INH AEPB Inhale 1 puff into the lungs daily.  Marland Kitchen omeprazole (PRILOSEC) 40 MG capsule Take 40 mg daily by mouth.   . tamsulosin (FLOMAX) 0.4 MG CAPS capsule Take 1 capsule (0.4 mg total) by mouth daily.  . vitamin B-12 (CYANOCOBALAMIN) 1000 MCG tablet Take by mouth.  . [DISCONTINUED] latanoprost (XALATAN) 0.005 % ophthalmic solution latanoprost 0.005 % eye drops  . [DISCONTINUED] montelukast (SINGULAIR) 10 MG tablet Take 1 tablet (10 mg total) by mouth at bedtime.  . [DISCONTINUED] acetaZOLAMIDE (DIAMOX) 500 MG capsule Take 500 mg by mouth 2 (two) times daily.    No facility-administered encounter medications on file as of 08/23/2018.     Activities of Daily Living In your present state of health, do you have any difficulty performing the following activities: 08/23/2018  Hearing? N  Vision? N  Difficulty concentrating or making decisions? N  Walking or climbing stairs? N  Dressing or bathing? N  Doing errands, shopping? N  Preparing Food and eating ? N  Using the Toilet? N  In the past six months, have you accidently leaked urine? N  Do you have problems with loss of bowel control? N  Managing your Medications? N  Managing your Finances? N  Housekeeping or managing your Housekeeping? N  Some recent data might be hidden    Patient Care Team: Einar Pheasant, MD as PCP - General (Internal Medicine)   Assessment:   This is a routine wellness examination for Jose Perry.  The goal of the wellness visit is to assist the patient how to close the gaps in care and create a preventative care plan for the patient.   The roster of all physicians providing medical care to patient is listed in the Snapshot section of the chart.  Taking calcium VIT D as appropriate/Osteoporosis risk reviewed.    HTN-followed by pcp.  He did not take his antihypertensive medication this morning. BP reading today 142/78. Encouraged to take all  medication as directed.   Safety issues reviewed; Smoke and carbon monoxide detectors in the home. Firearm safety discussed. Wears seatbelts when driving or riding with others. No violence in the home.  They do not have excessive sun exposure.  Discussed the need for sun protection: hats, long sleeves and the use of sunscreen if there is significant sun exposure.  Patient is alert, normal appearance, oriented to person/place/and time. Correctly identified the president of the [place] and recalls of 3/3 words. Performs simple calculations and can read correct time  from watch face. Displays appropriate judgement.  No new identified risk were noted.  No failures at ADL's or IADL's.    BMI- discussed the importance of a healthy diet, water intake and the benefits of aerobic exercise.He has a healthy diet, adequate water intake and is easing back into his exercise regimen after physical activity was limited due to eye surgery and recovery complications. He is feeling much better now.   Dental- every 6 months.  Sleep patterns- Sleeps without issues.   Recheck Vit B level with the cancer center in January. Taking 1066mcg daily.   TDAP vaccine discussed. He plans to receive at his local pharmacy and notify pcp after complete. Educational material provided.  Patient Concerns: None at this time. Follow up with PCP as needed.  Exercise Activities and Dietary recommendations Current Exercise Habits: Structured exercise class, Type of exercise: calisthenics, Time (Minutes): 60, Frequency (Times/Week): 7, Weekly Exercise (Minutes/Week): 420, Intensity: Moderate  Goals    . Maintain Healthy Lifestyle       Fall Risk Fall Risk  08/23/2018 08/20/2017 06/21/2017 08/18/2016 07/04/2016  Falls in the past year? 0 No No No No   Depression Screen PHQ 2/9 Scores 08/23/2018 08/20/2017 06/21/2017 08/18/2016  PHQ - 2 Score 0 0 0 0  PHQ- 9 Score - 0 - -    Cognitive Function MMSE - Mini Mental State Exam  08/23/2018 08/20/2017 08/18/2016  Orientation to time 5 5 5   Orientation to Place 5 5 5   Registration 3 3 3   Attention/ Calculation 5 5 5   Recall 3 3 3   Language- name 2 objects 2 2 2   Language- repeat 1 1 1   Language- follow 3 step command 3 3 3   Language- read & follow direction 1 1 1   Write a sentence 1 1 1   Copy design 1 1 1   Total score 30 30 30      6CIT Screen 08/18/2016  What Year? 0 points  What month? 0 points  What time? 0 points  Count back from 20 0 points  Months in reverse 0 points    Immunization History  Administered Date(s) Administered  . Influenza, High Dose Seasonal PF 06/13/2016, 08/20/2017, 07/31/2018  . Influenza,inj,Quad PF,6+ Mos 07/20/2015  . Pneumococcal Conjugate-13 06/10/2015  . Pneumococcal Polysaccharide-23 06/30/2013  . Zoster 06/30/2013   Screening Tests Health Maintenance  Topic Date Due  . TETANUS/TDAP  01/25/1961  . INFLUENZA VACCINE  Completed  . PNA vac Low Risk Adult  Completed      Plan:    End of life planning; Advanced aging; Advanced directives discussed.  No HCPOA/Living Will.  Additional information declined at this time.  Keep all routine maintenance appointments.   I have personally reviewed and noted the following in the patient's chart:   . Medical and social history . Use of alcohol, tobacco or illicit drugs  . Current medications and supplements . Functional ability and status . Nutritional status . Physical activity . Advanced directives . List of other physicians . Hospitalizations, surgeries, and ER visits in previous 12 months . Vitals . Screenings to include cognitive, depression, and falls . Referrals and appointments  In addition, I have reviewed and discussed with patient certain preventive protocols, quality metrics, and best practice recommendations. A written personalized care plan for preventive services as well as general preventive health recommendations were provided to patient.      Varney Biles, LPN  34/[id number]/[id number]   Reviewed above information.  Agree with assessment and plan.  Dr. Scott

## 2018-08-23 NOTE — Patient Instructions (Addendum)
  Mr. Wolters , Thank you for taking time to come for your Medicare Wellness Visit. I appreciate your ongoing commitment to your health goals. Please review the following plan we discussed and let me know if I can assist you in the future.   Follow up as needed.    Bring a copy of your [place] and/or Living Will to be scanned into chart once completed.  Have a great day!  These are the goals we discussed: Goals    . Maintain Healthy Lifestyle       This is a list of the screening recommended for you and due dates:  Health Maintenance  Topic Date Due  . Tetanus Vaccine  01/25/1961  . Flu Shot  Completed  . Pneumonia vaccines  Completed

## 2018-08-24 LAB — BUN+CREAT
BUN/Creatinine Ratio: 6 — ABNORMAL LOW (ref 10–24)
BUN: 7 mg/dL — AB (ref 8–27)
Creatinine, Ser: 1.19 mg/dL (ref 0.76–1.27)
GFR, EST AFRICAN AMERICAN: 68 mL/min/{1.73_m2} (ref >59)
GFR, EST NON AFRICAN AMERICAN: 59 mL/min/{1.73_m2} — AB (ref >59)

## 2018-08-26 ENCOUNTER — Other Ambulatory Visit: Payer: Self-pay

## 2018-08-26 ENCOUNTER — Telehealth: Payer: Self-pay

## 2018-08-26 NOTE — Telephone Encounter (Signed)
Patient notified

## 2018-08-26 NOTE — Telephone Encounter (Signed)
-----   Message from Nori Riis, PA-C sent at 08/26/2018 10:45 AM EST ----- Please let Mr. Kleinman know that his kidney function is now normal.

## 2018-09-10 ENCOUNTER — Ambulatory Visit (INDEPENDENT_AMBULATORY_CARE_PROVIDER_SITE_OTHER): Payer: Medicare Other | Admitting: Pulmonary Disease

## 2018-09-10 ENCOUNTER — Encounter: Payer: Self-pay | Admitting: Pulmonary Disease

## 2018-09-10 VITALS — BP 120/68 | HR 67 | Ht 68.0 in | Wt 165.0 lb

## 2018-09-10 DIAGNOSIS — J4550 Severe persistent asthma, uncomplicated: Secondary | ICD-10-CM

## 2018-09-10 DIAGNOSIS — R0602 Shortness of breath: Secondary | ICD-10-CM | POA: Diagnosis not present

## 2018-09-10 NOTE — Patient Instructions (Signed)
1) Use your Breo 1 puff daily, this will likely decrease your need on the as needed inhaler (emergency inhaler).  2) make sure you rinse well after using Breo.  3) see you in follow-up in 3 months time or prior to that if needed.

## 2018-09-10 NOTE — Progress Notes (Signed)
   Subjective:    Patient ID: Jose Perry, male    DOB: 1941/05/21, 77 y.o.   MRN: [id number]  HPI Patient is a 77 year old lifelong never smoker, who presents for follow-up of severe persistent asthma which was poorly controlled.  Recall that he had pulmonary function testing performed on 3 October which showed an FEV1 of 0.77 L or 31% predicted with an FVC of 1.52 L or 47% predicted.  After bronchodilator however the patient had a 25% change on his FVC and a 40% change in his FEV1.  He has been on Kellogg and as needed albuterol.  He had issues after glaucoma surgery which was performed on 17 October.  The issues where urinary retention and he had to wear a Foley for a while.  These issues have now resolved.  He notes that his breathing is markedly better.  He has now decreased the use of Breo to as needed but notes that he has to use his albuterol 1-2 times per day.  I have advised him that the Memory Dance is a maintenance medication and given the severity of his asthma I would be prudent for him to use it every day.  He does not describe any fevers, chills or sweats.  He has had no cough.  No sputum production no hemoptysis.  He looks and feels well today.   Review of Systems  Constitutional: Negative.   HENT: Negative.   Eyes: Negative.   Respiratory: Negative.   Cardiovascular: Negative.   Gastrointestinal: Negative.   Genitourinary:       Resolved issues with urinary retention after surgery.  Neurological: Negative.   All other systems reviewed and are negative.      Objective:   Physical Exam  Constitutional: He is oriented to person, place, and time. He appears well-developed and well-nourished.  HENT:  Head: Normocephalic and atraumatic.  Eyes: Pupils are equal, round, and reactive to light. EOM are normal. No scleral icterus.  Neck: Normal range of motion. Neck supple. No JVD present. No tracheal deviation present. No thyromegaly present.  Cardiovascular: Normal rate, regular  rhythm and normal heart sounds.  Pulmonary/Chest: Effort normal and breath sounds normal.  Abdominal: Soft. He exhibits no distension.  Musculoskeletal: Normal range of motion. He exhibits no edema.  Lymphadenopathy:    He has no cervical adenopathy.  Neurological: He is alert and oriented to person, place, and time.  No focal deficits noted.  Skin: Skin is warm and dry. No rash noted. No erythema. No pallor.  Psychiatric: He has a normal mood and affect. His behavior is normal. Thought content normal.          Assessment & Plan:    1) Severe Persistent Asthma without Complication: Patient appears better controlled.  He is on Kellogg which is what is provided by his insurance.  I have reminded him to use Breo Ellipta daily.  Use of albuterol hopefully will be reduced by this.  We will see him in follow-up in 3 months time he is to contact us prior to that time should any new difficulties arise.  2) Dyspnea: This has resolved completely.  Likely due to poorly treated asthma.

## 2018-09-12 ENCOUNTER — Other Ambulatory Visit: Payer: Self-pay | Admitting: Internal Medicine

## 2018-09-19 ENCOUNTER — Other Ambulatory Visit: Payer: Self-pay | Admitting: Internal Medicine

## 2018-09-19 ENCOUNTER — Telehealth: Payer: Self-pay | Admitting: Radiology

## 2018-09-19 DIAGNOSIS — I10 Essential (primary) hypertension: Secondary | ICD-10-CM

## 2018-09-19 NOTE — Telephone Encounter (Signed)
Pt coming in for labs tomorrow, please place future orders. Thank you.  

## 2018-09-19 NOTE — Progress Notes (Signed)
Order placed for labs.

## 2018-09-19 NOTE — Telephone Encounter (Signed)
Orders placed for labs

## 2018-09-20 ENCOUNTER — Other Ambulatory Visit (INDEPENDENT_AMBULATORY_CARE_PROVIDER_SITE_OTHER): Payer: Medicare Other

## 2018-09-20 DIAGNOSIS — I10 Essential (primary) hypertension: Secondary | ICD-10-CM

## 2018-09-20 LAB — HEPATIC FUNCTION PANEL
ALT: 17 U/L (ref 0–53)
AST: 25 U/L (ref 0–37)
Albumin: 4.2 g/dL (ref 3.5–5.2)
Alkaline Phosphatase: 65 U/L (ref 39–117)
BILIRUBIN DIRECT: 0.1 mg/dL (ref 0.0–0.3)
BILIRUBIN TOTAL: 0.5 mg/dL (ref 0.2–1.2)
Total Protein: 6.7 g/dL (ref 6.0–8.3)

## 2018-09-20 LAB — LIPID PANEL
CHOL/HDL RATIO: 3
Cholesterol: 188 mg/dL (ref 0–200)
HDL: 69.1 mg/dL (ref >39.00)
LDL CALC: 111 mg/dL — AB (ref 0–99)
NonHDL: 118.56
TRIGLYCERIDES: 40 mg/dL (ref 0.0–149.0)
VLDL: 8 mg/dL (ref 0.0–40.0)

## 2018-09-20 LAB — BASIC METABOLIC PANEL
BUN: 11 mg/dL (ref 6–23)
CALCIUM: 9.5 mg/dL (ref 8.4–10.5)
CO2: 28 mEq/L (ref 19–32)
CREATININE: 1.23 mg/dL (ref 0.40–1.50)
Chloride: 103 mEq/L (ref 96–112)
GFR: 73.45 mL/min (ref >60.00)
Glucose, Bld: 92 mg/dL (ref 70–99)
Potassium: 3.9 mEq/L (ref 3.5–5.1)
Sodium: 139 mEq/L (ref 135–145)

## 2018-09-23 ENCOUNTER — Encounter: Payer: Self-pay | Admitting: Hematology and Oncology

## 2018-09-24 ENCOUNTER — Encounter: Payer: Self-pay | Admitting: Internal Medicine

## 2018-09-24 ENCOUNTER — Ambulatory Visit (INDEPENDENT_AMBULATORY_CARE_PROVIDER_SITE_OTHER): Payer: Medicare Other | Admitting: Internal Medicine

## 2018-09-24 DIAGNOSIS — K219 Gastro-esophageal reflux disease without esophagitis: Secondary | ICD-10-CM

## 2018-09-24 DIAGNOSIS — E049 Nontoxic goiter, unspecified: Secondary | ICD-10-CM

## 2018-09-24 DIAGNOSIS — J4520 Mild intermittent asthma, uncomplicated: Secondary | ICD-10-CM | POA: Diagnosis not present

## 2018-09-24 DIAGNOSIS — E538 Deficiency of other specified B group vitamins: Secondary | ICD-10-CM | POA: Diagnosis not present

## 2018-09-24 DIAGNOSIS — D696 Thrombocytopenia, unspecified: Secondary | ICD-10-CM

## 2018-09-24 DIAGNOSIS — D72819 Decreased white blood cell count, unspecified: Secondary | ICD-10-CM | POA: Diagnosis not present

## 2018-09-24 DIAGNOSIS — N401 Enlarged prostate with lower urinary tract symptoms: Secondary | ICD-10-CM | POA: Diagnosis not present

## 2018-09-24 DIAGNOSIS — Z0000 Encounter for general adult medical examination without abnormal findings: Secondary | ICD-10-CM

## 2018-09-24 DIAGNOSIS — N138 Other obstructive and reflux uropathy: Secondary | ICD-10-CM

## 2018-09-24 DIAGNOSIS — I10 Essential (primary) hypertension: Secondary | ICD-10-CM | POA: Diagnosis not present

## 2018-09-24 NOTE — Progress Notes (Signed)
Patient ID: Jose Perry, male   DOB: 01-Aug-1941, 77 y.o.   MRN: [id number]   Subjective:    Patient ID: Jose Perry, male    DOB: Feb 08, 1941, 77 y.o.   MRN: [id number]  HPI  Patient with past history of asthma and B12 deficiency.  He comes in today to follow up on these issues as well as for a complete physical exam.  Recently evaluated by pulmonary for asthma.  On Breo and prn albuterol.  Breathing better.  Also saw urology recently for urinary retention.  Had foley for a while.  No urinary symptoms now.  No chest pain.  Stays active.  No acid reflux.  No abdominal pain.  Bowels moving.  Discussed recent labs.  Discussed calculated cholesterol risk - 12.8%.  Discussed starting a cholesterol medication.  He declines.     Past Medical History:  Diagnosis Date  . Asthma   . Dyspnea   . Elevated blood pressure   . Enlarged prostate   . GERD (gastroesophageal reflux disease)   . Glaucoma   . History of adenomatous polyp of colon   . Hx of colonic polyps   . Hypertension   . Leukopenia    has had an extensive w/up.    . Liver hemangioma 01/24/2018  . Thyroid goiter    Past Surgical History:  Procedure Laterality Date  . BACK SURGERY  2002   ruptured disc  . COLONOSCOPY W/ POLYPECTOMY  04/24/2005, 07/06/2010, 07/08/2014  . COLONOSCOPY WITH PROPOFOL N/A 02/01/2018   Procedure: COLONOSCOPY WITH PROPOFOL;  Surgeon: Manya Silvas, MD;  Location: [facility] ENDOSCOPY;  Service: Endoscopy;  Laterality: N/A;  . ESOPHAGOGASTRODUODENOSCOPY  04/24/2005, 07/06/2010,  . EYE SURGERY  2009   relieve pressure from glaucoma  . THYROID SURGERY  1968   goiter   Family History  Problem Relation Age of Onset  . Heart disease Mother   . Alcohol abuse Father   . Hyperlipidemia Father   . Stroke Father    Social History   Socioeconomic History  . Marital status: Married    Spouse name: Not on file  . Number of children: Not on file  . Years of education: Not on file  . Highest education level:  Not on file  Occupational History  . Not on file  Social Needs  . Financial resource strain: Not hard at all  . Food insecurity:    Worry: Never true    Inability: Never true  . Transportation needs:    Medical: No    Non-medical: No  Tobacco Use  . Smoking status: Never Smoker  . Smokeless tobacco: Never Used  Substance and Sexual Activity  . Alcohol use: No    Alcohol/week: 0.0 standard drinks  . Drug use: No  . Sexual activity: Yes  Lifestyle  . Physical activity:    Days per week: 7 days    Minutes per session: 60 min  . Stress: Not at all  Relationships  . Social connections:    Talks on phone: Not on file    Gets together: Not on file    Attends religious service: Not on file    Active member of club or organization: Not on file    Attends meetings of clubs or organizations: Not on file    Relationship status: Married  Other Topics Concern  . Not on file  Social History Narrative  . Not on file    Outpatient Encounter Medications as of 09/24/2018  Medication Sig  .  albuterol (PROVENTIL HFA;VENTOLIN HFA) 108 (90 Base) MCG/ACT inhaler TAKE 2 PUFFS BY MOUTH EVERY 6 HOURS AS NEEDED  . amLODipine (NORVASC) 5 MG tablet TAKE 1 TABLET BY MOUTH EVERY DAY  . fluticasone furoate-vilanterol (BREO ELLIPTA) 100-25 MCG/INH AEPB Inhale 1 puff into the lungs daily.  Marland Kitchen omeprazole (PRILOSEC) 40 MG capsule Take 40 mg daily by mouth.   . tamsulosin (FLOMAX) 0.4 MG CAPS capsule Take 1 capsule (0.4 mg total) by mouth daily.  . vitamin B-12 (CYANOCOBALAMIN) 1000 MCG tablet Take by mouth.   No facility-administered encounter medications on file as of 09/24/2018.     Review of Systems  Constitutional: Negative for appetite change and unexpected weight change.  HENT: Negative for congestion and sinus pressure.   Eyes: Negative for pain and visual disturbance.  Respiratory: Negative for cough, chest tightness and shortness of breath.   Cardiovascular: Negative for chest pain,  palpitations and leg swelling.  Gastrointestinal: Negative for abdominal pain, diarrhea, nausea and vomiting.  Genitourinary: Negative for difficulty urinating and dysuria.  Musculoskeletal: Negative for joint swelling and myalgias.  Skin: Negative for color change and rash.  Neurological: Negative for dizziness, light-headedness and headaches.  Hematological: Negative for adenopathy. Does not bruise/bleed easily.  Psychiatric/Behavioral: Negative for agitation and dysphoric mood.       Objective:    Physical Exam Constitutional:      General: He is not in acute distress.    Appearance: Normal appearance. He is well-developed.  HENT:     Head: Normocephalic and atraumatic.     Nose: Nose normal. No congestion.     Mouth/Throat:     Pharynx: No oropharyngeal exudate or posterior oropharyngeal erythema.  Eyes:     General:        Right eye: No discharge.        Left eye: No discharge.     Conjunctiva/sclera: Conjunctivae normal.  Neck:     Musculoskeletal: Neck supple.     Thyroid: No thyromegaly.  Cardiovascular:     Rate and Rhythm: Normal rate and regular rhythm.  Pulmonary:     Effort: No respiratory distress.     Breath sounds: Normal breath sounds. No wheezing.  Abdominal:     General: Bowel sounds are normal.     Palpations: Abdomen is soft.     Tenderness: There is no abdominal tenderness.  Genitourinary:    Comments: Has seen urology.   Musculoskeletal:        General: No swelling or tenderness.  Lymphadenopathy:     Cervical: No cervical adenopathy.  Skin:    Findings: No erythema or rash.  Neurological:     Mental Status: He is alert and oriented to person, place, and time.  Psychiatric:        Mood and Affect: Mood normal.        Behavior: Behavior normal.     BP 128/78 (BP Location: Left Arm, Patient Position: Sitting, Cuff Size: Normal)   Pulse 81   Temp 97.9 F (36.6 C) (Oral)   Resp 16   Ht 5\' 9"  (1.753 m)   Wt 168 lb 3.2 oz (76.3 kg)   SpO2  97%   BMI 24.84 kg/m  Wt Readings from Last 3 Encounters:  09/24/18 168 lb 3.2 oz (76.3 kg)  09/10/18 165 lb (74.8 kg)  08/23/18 162 lb 12.8 oz (73.8 kg)     Lab Results  Component Value Date   WBC 4.4 07/27/2018   HGB 14.1 07/27/2018   HCT  43.9 07/27/2018   PLT 151 07/27/2018   GLUCOSE 92 09/20/2018   CHOL 188 09/20/2018   TRIG 40.0 09/20/2018   HDL 69.10 09/20/2018   LDLCALC 111 (H) 09/20/2018   ALT 17 09/20/2018   AST 25 09/20/2018   NA 139 09/20/2018   K 3.9 09/20/2018   CL 103 09/20/2018   CREATININE 1.23 09/20/2018   BUN 11 09/20/2018   CO2 28 09/20/2018   TSH 1.852 05/06/2018   PSA 2.75 08/11/2014       Assessment & Plan:   Problem List Items Addressed This Visit    Asthma    Saw pulmonary doing well on Breo.  No sob.  No increased cough or congestion.  Follow.        B12 deficiency    Recheck B12 with next labs.        Relevant Orders   Vitamin B12   Benign localized hyperplasia of prostate with urinary obstruction    Saw urology.  Doing well now.  No urinary symptoms.        GERD (gastroesophageal reflux disease)    Controlled on current regimen.  Follow.        Goiter    Previous CT with enlarged left thyroid lobe and what appeared to be a nodule present.  Evaluated by endocrinology 09/2017.  Stable.  Recommended f/u in one year.  Is scheduled for f/u ultrasound next week.        Relevant Orders   TSH   Health care maintenance    Physical today 09/24/18.  PSA and prostate checks through urology.  Colonoscopy 2015.  Recommended f/u in 2020.        Hypertension    Blood pressure under good control.  Continue same medication regimen.  Follow pressures.  Follow metabolic panel.        Relevant Orders   Basic metabolic panel   Leukopenia    Evaluated by hematology.  White count stable.  Follow cbc.        Thrombocytopenia ([place])    Follow cbc.            Einar Pheasant, MD

## 2018-09-24 NOTE — Assessment & Plan Note (Signed)
Physical today 09/24/18.  PSA and prostate checks through urology.  Colonoscopy 2015.  Recommended f/u in 2020.

## 2018-09-26 DIAGNOSIS — E042 Nontoxic multinodular goiter: Secondary | ICD-10-CM | POA: Diagnosis not present

## 2018-09-29 ENCOUNTER — Encounter: Payer: Self-pay | Admitting: Internal Medicine

## 2018-09-29 NOTE — Assessment & Plan Note (Signed)
Previous CT with enlarged left thyroid lobe and what appeared to be a nodule present.  Evaluated by endocrinology 09/2017.  Stable.  Recommended f/u in one year.  Is scheduled for f/u ultrasound next week.

## 2018-09-29 NOTE — Assessment & Plan Note (Signed)
Controlled on current regimen.  Follow.  

## 2018-09-29 NOTE — Assessment & Plan Note (Signed)
Saw pulmonary doing well on Breo.  No sob.  No increased cough or congestion.  Follow.

## 2018-09-29 NOTE — Assessment & Plan Note (Signed)
Saw urology.  Doing well now.  No urinary symptoms.

## 2018-09-29 NOTE — Assessment & Plan Note (Signed)
Recheck B12 with next labs.

## 2018-09-29 NOTE — Assessment & Plan Note (Signed)
Blood pressure under good control.  Continue same medication regimen.  Follow pressures.  Follow metabolic panel.   

## 2018-09-29 NOTE — Assessment & Plan Note (Signed)
Evaluated by hematology.  White count stable.  Follow cbc.

## 2018-09-29 NOTE — Assessment & Plan Note (Signed)
Follow cbc.  

## 2018-10-05 IMAGING — CT CT CHEST W/O CM
2 of 3 series · 15 of 36 positions shown, 18 images · non-contrast
Comparison: Radiograph August 27, 2017. CT scan [DATE]

CLINICAL DATA: Lung nodule.

EXAM:
CT CHEST WITHOUT CONTRAST
TECHNIQUE: Multidetector CT imaging of the chest was performed following the
standard protocol without IV contrast.

[Series 2: thorax · axial · 0.70mm/px · z∈[-366,-84]mm · 12 of 167 slices shown, 15 images]
[im 13/167  mediastinal]
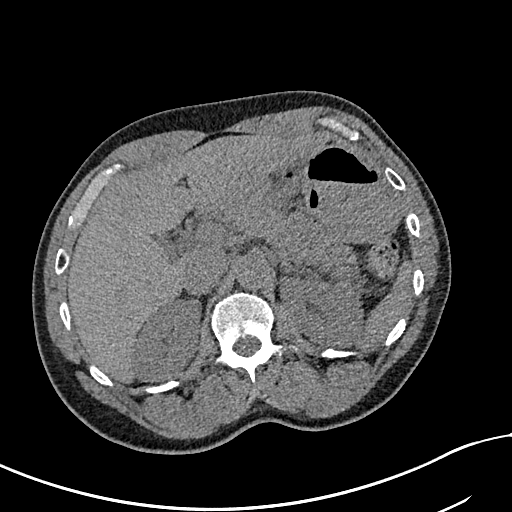
[im 13/167  lung]
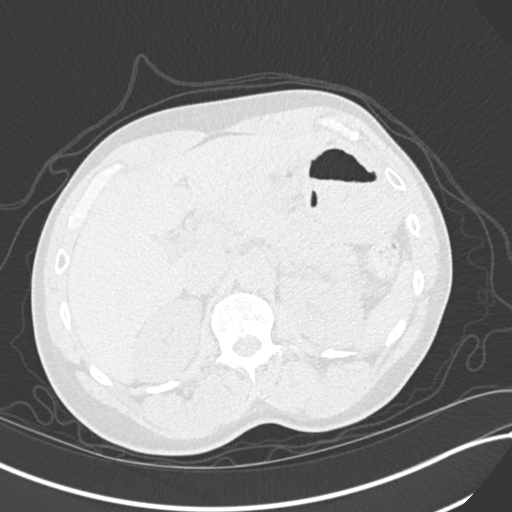
[im 25/167  lung]
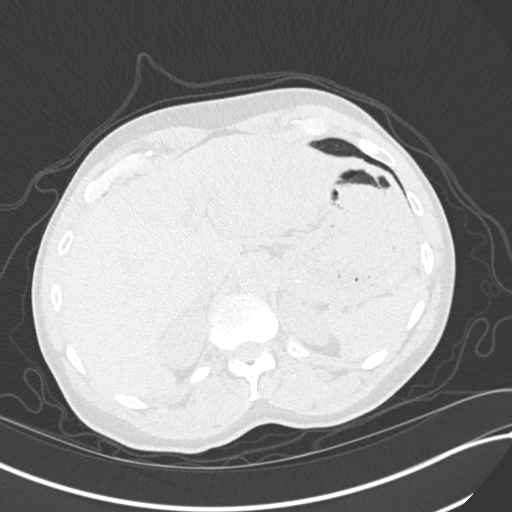
[im 37/167  lung]
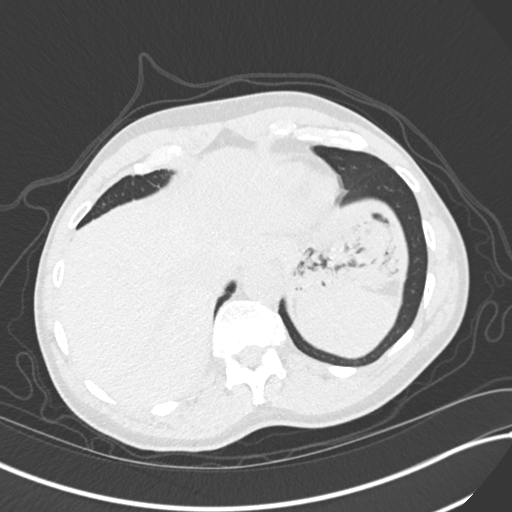
[im 50/167  lung]
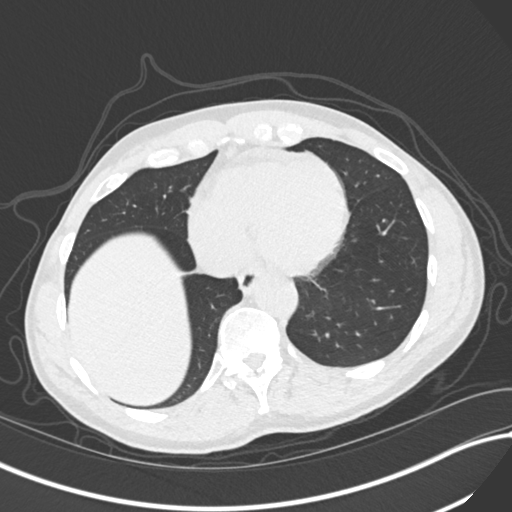
[im 62/167  mediastinal]
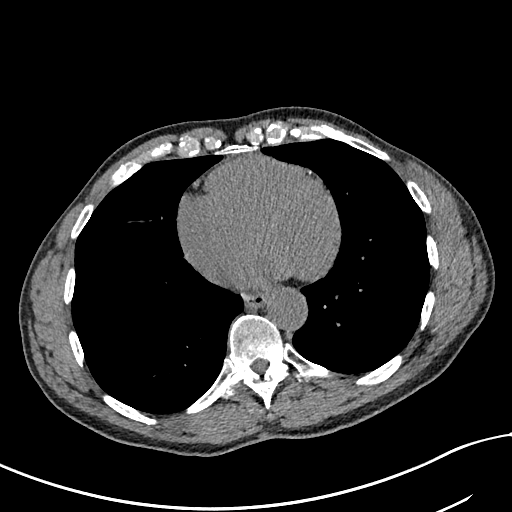
[im 62/167  lung]
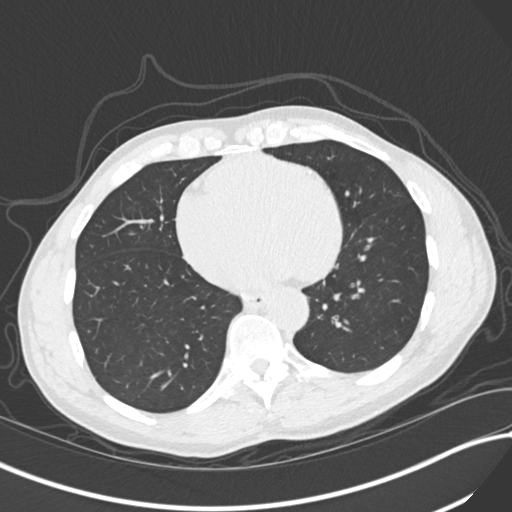
[im 74/167  lung]
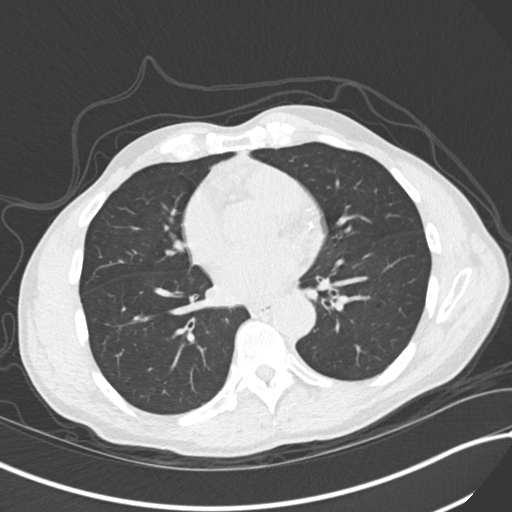
[im 93/167  lung]
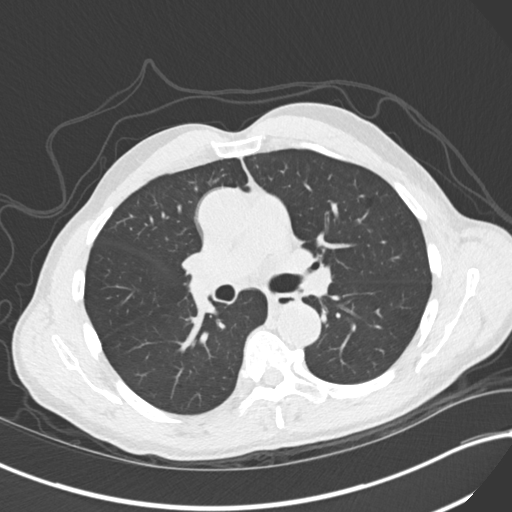
[im 105/167  lung]
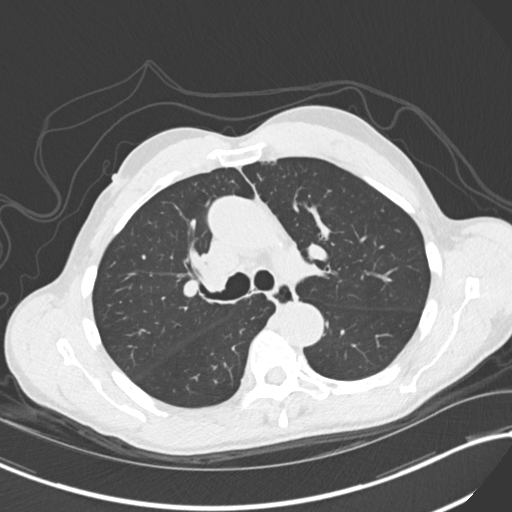
[im 117/167  mediastinal]
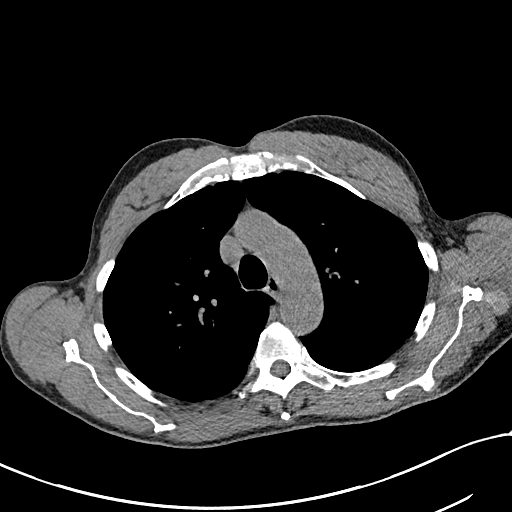
[im 117/167  lung]
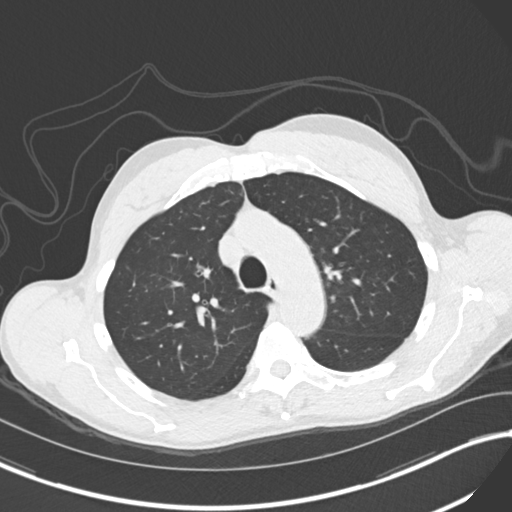
[im 130/167  lung]
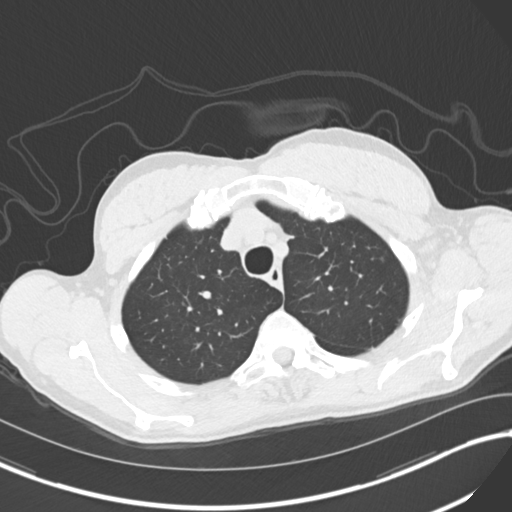
[im 142/167  lung]
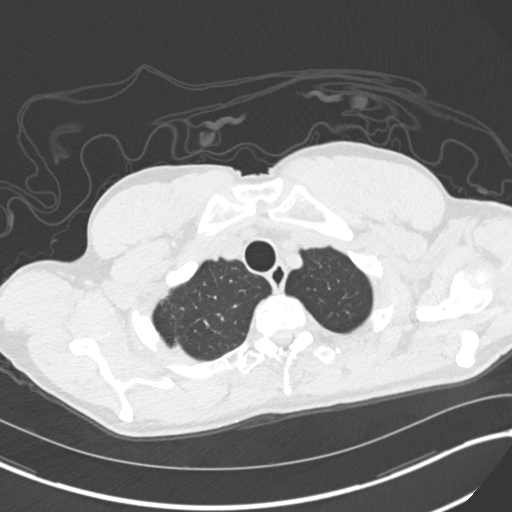
[im 154/167  lung]
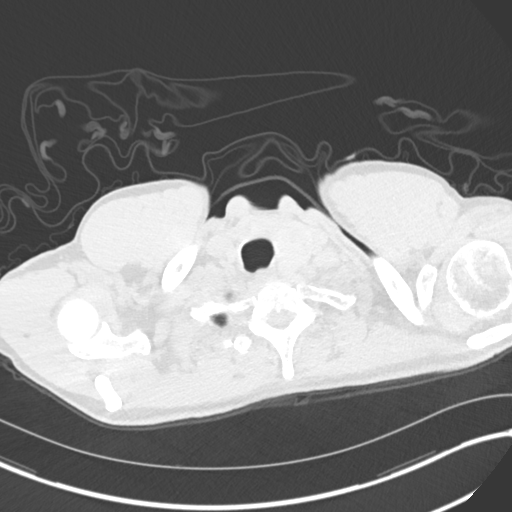

[Series 5: coronal · coronal · 0.69mm/px · 3 of 118 slices shown]
[im 24/118  lung]
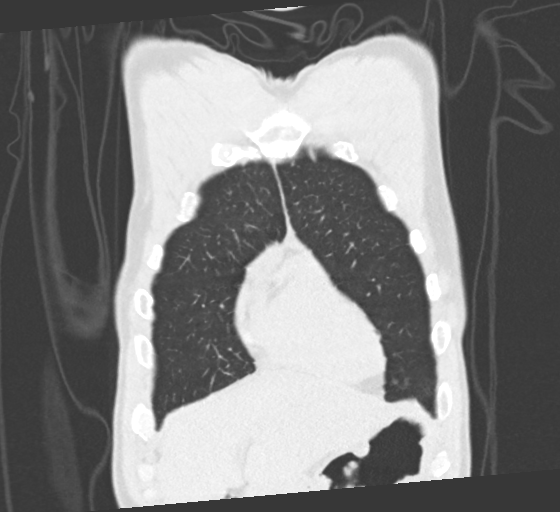
[im 47/118  lung]
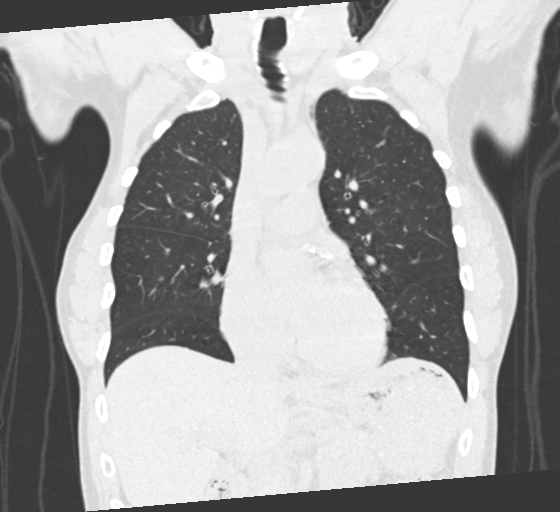
[im 71/118  lung]
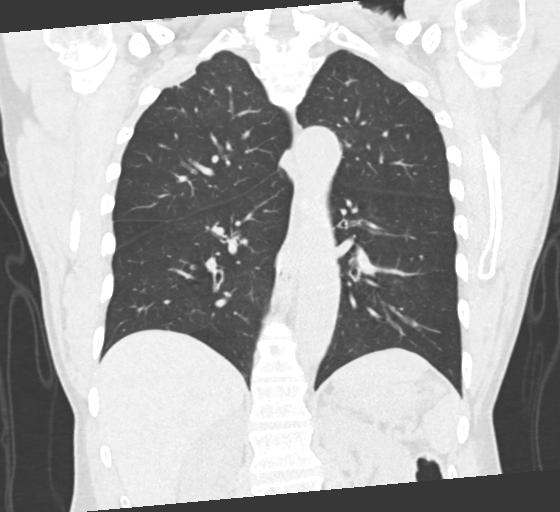

[15 of 36 positions shown; findings below may reference images not displayed]

FINDINGS: Cardiovascular: Coronary artery calcifications are noted. There is
no evidence of thoracic aortic aneurysm. Normal cardiac size. No
pericardial effusion.

Mediastinum/Nodes: Enlarged left thyroid gland is again noted
concerning for possible nodule. Thyroid ultrasound is recommended.
Esophagus is unremarkable. No adenopathy is noted.

Lungs/Pleura: Lungs are clear. No pleural effusion or pneumothorax.

Upper Abdomen: No acute abnormality.

Musculoskeletal: No chest wall mass or suspicious bone lesions
identified.
IMPRESSION: No pulmonary nodule is noted.

Coronary artery calcifications are noted which may suggest coronary
artery disease.

Enlarged left thyroid gland is again noted. Thyroid ultrasound is
recommended to evaluate for underlying nodule or mass.

## 2018-10-15 ENCOUNTER — Telehealth: Payer: Self-pay | Admitting: *Deleted

## 2018-10-15 NOTE — Telephone Encounter (Signed)
Copied from [id number]. Topic: General - Inquiry >> Oct 15, 2018  4:52 PM Alanda Slim E wrote: Reason for CRM: Pt called to inquire about his next appt/ advised Pt of labs for 01.14.2020 and he asked about his follow up appt.  Advised Pt that that was in June with another set of labs. Pt was confused about two lab appt but then said the next one was to check his B12 / please advise if he still needs the next lab appointment

## 2018-10-16 NOTE — Telephone Encounter (Signed)
Patient was just wondering if he needed to have appt to go over labs or if I would call with the results. Advised patient that I would be calling with results.

## 2018-10-21 ENCOUNTER — Other Ambulatory Visit: Payer: Medicare Other

## 2018-10-21 ENCOUNTER — Ambulatory Visit: Payer: Medicare Other | Admitting: Oncology

## 2018-10-21 ENCOUNTER — Ambulatory Visit: Payer: Medicare Other | Admitting: Hematology and Oncology

## 2018-10-22 ENCOUNTER — Other Ambulatory Visit: Payer: Medicare Other

## 2018-10-23 ENCOUNTER — Other Ambulatory Visit (INDEPENDENT_AMBULATORY_CARE_PROVIDER_SITE_OTHER): Payer: Medicare Other

## 2018-10-23 DIAGNOSIS — I10 Essential (primary) hypertension: Secondary | ICD-10-CM | POA: Diagnosis not present

## 2018-10-23 DIAGNOSIS — E538 Deficiency of other specified B group vitamins: Secondary | ICD-10-CM | POA: Diagnosis not present

## 2018-10-23 DIAGNOSIS — E049 Nontoxic goiter, unspecified: Secondary | ICD-10-CM | POA: Diagnosis not present

## 2018-10-23 LAB — BASIC METABOLIC PANEL
BUN: 14 mg/dL (ref 6–23)
CALCIUM: 10 mg/dL (ref 8.4–10.5)
CO2: 29 mEq/L (ref 19–32)
Chloride: 103 mEq/L (ref 96–112)
Creatinine, Ser: 1.37 mg/dL (ref 0.40–1.50)
GFR: 64.84 mL/min (ref >60.00)
Glucose, Bld: 64 mg/dL — ABNORMAL LOW (ref 70–99)
Potassium: 3.8 mEq/L (ref 3.5–5.1)
SODIUM: 139 meq/L (ref 135–145)

## 2018-10-23 LAB — TSH: TSH: 1.79 u[IU]/mL (ref 0.35–4.50)

## 2018-10-23 LAB — VITAMIN B12: VITAMIN B 12: 854 pg/mL (ref 211–911)

## 2018-10-24 ENCOUNTER — Other Ambulatory Visit: Payer: Self-pay | Admitting: Internal Medicine

## 2018-10-24 DIAGNOSIS — R944 Abnormal results of kidney function studies: Secondary | ICD-10-CM

## 2018-10-24 NOTE — Progress Notes (Signed)
Order placed for f/u met b 

## 2018-11-06 ENCOUNTER — Other Ambulatory Visit (INDEPENDENT_AMBULATORY_CARE_PROVIDER_SITE_OTHER): Payer: Medicare Other

## 2018-11-06 DIAGNOSIS — R944 Abnormal results of kidney function studies: Secondary | ICD-10-CM

## 2018-11-06 LAB — BASIC METABOLIC PANEL
BUN: 10 mg/dL (ref 6–23)
CHLORIDE: 103 meq/L (ref 96–112)
CO2: 29 meq/L (ref 19–32)
Calcium: 9.7 mg/dL (ref 8.4–10.5)
Creatinine, Ser: 1.2 mg/dL (ref 0.40–1.50)
GFR: 71.08 mL/min (ref >60.00)
Glucose, Bld: 73 mg/dL (ref 70–99)
POTASSIUM: 4 meq/L (ref 3.5–5.1)
Sodium: 137 mEq/L (ref 135–145)

## 2018-11-29 DIAGNOSIS — H2513 Age-related nuclear cataract, bilateral: Secondary | ICD-10-CM | POA: Diagnosis not present

## 2018-11-29 DIAGNOSIS — H401133 Primary open-angle glaucoma, bilateral, severe stage: Secondary | ICD-10-CM | POA: Diagnosis not present

## 2018-12-16 DIAGNOSIS — K121 Other forms of stomatitis: Secondary | ICD-10-CM | POA: Diagnosis not present

## 2018-12-16 DIAGNOSIS — H6123 Impacted cerumen, bilateral: Secondary | ICD-10-CM | POA: Diagnosis not present

## 2018-12-16 DIAGNOSIS — J301 Allergic rhinitis due to pollen: Secondary | ICD-10-CM | POA: Diagnosis not present

## 2019-03-12 ENCOUNTER — Other Ambulatory Visit: Payer: Self-pay | Admitting: Internal Medicine

## 2019-03-19 DIAGNOSIS — H5010 Unspecified exotropia: Secondary | ICD-10-CM | POA: Diagnosis not present

## 2019-03-19 DIAGNOSIS — H2513 Age-related nuclear cataract, bilateral: Secondary | ICD-10-CM | POA: Diagnosis not present

## 2019-03-19 DIAGNOSIS — H401133 Primary open-angle glaucoma, bilateral, severe stage: Secondary | ICD-10-CM | POA: Diagnosis not present

## 2019-03-26 ENCOUNTER — Telehealth: Payer: Self-pay | Admitting: *Deleted

## 2019-03-26 DIAGNOSIS — I10 Essential (primary) hypertension: Secondary | ICD-10-CM

## 2019-03-26 NOTE — Telephone Encounter (Signed)
Please place future orders for lab appt.  

## 2019-03-26 NOTE — Telephone Encounter (Signed)
Orders placed for f/u labs.  

## 2019-03-28 ENCOUNTER — Other Ambulatory Visit (INDEPENDENT_AMBULATORY_CARE_PROVIDER_SITE_OTHER): Payer: Medicare Other

## 2019-03-28 ENCOUNTER — Other Ambulatory Visit: Payer: Self-pay

## 2019-03-28 DIAGNOSIS — I10 Essential (primary) hypertension: Secondary | ICD-10-CM

## 2019-03-28 LAB — HEPATIC FUNCTION PANEL
ALT: 17 U/L (ref 0–53)
AST: 25 U/L (ref 0–37)
Albumin: 4.2 g/dL (ref 3.5–5.2)
Alkaline Phosphatase: 83 U/L (ref 39–117)
Bilirubin, Direct: 0.1 mg/dL (ref 0.0–0.3)
Total Bilirubin: 0.4 mg/dL (ref 0.2–1.2)
Total Protein: 6.5 g/dL (ref 6.0–8.3)

## 2019-03-28 LAB — LIPID PANEL
Cholesterol: 161 mg/dL (ref 0–200)
HDL: 63.1 mg/dL (ref >39.00)
LDL Cholesterol: 90 mg/dL (ref 0–99)
NonHDL: 98.29
Total CHOL/HDL Ratio: 3
Triglycerides: 43 mg/dL (ref 0.0–149.0)
VLDL: 8.6 mg/dL (ref 0.0–40.0)

## 2019-03-28 LAB — BASIC METABOLIC PANEL
BUN: 18 mg/dL (ref 6–23)
CO2: 29 mEq/L (ref 19–32)
Calcium: 9.5 mg/dL (ref 8.4–10.5)
Chloride: 101 mEq/L (ref 96–112)
Creatinine, Ser: 1.3 mg/dL (ref 0.40–1.50)
GFR: 64.74 mL/min (ref >60.00)
Glucose, Bld: 88 mg/dL (ref 70–99)
Potassium: 4.5 mEq/L (ref 3.5–5.1)
Sodium: 137 mEq/L (ref 135–145)

## 2019-04-01 ENCOUNTER — Other Ambulatory Visit: Payer: Self-pay

## 2019-04-01 ENCOUNTER — Ambulatory Visit (INDEPENDENT_AMBULATORY_CARE_PROVIDER_SITE_OTHER): Payer: Medicare Other | Admitting: Internal Medicine

## 2019-04-01 ENCOUNTER — Encounter: Payer: Self-pay | Admitting: Internal Medicine

## 2019-04-01 DIAGNOSIS — H409 Unspecified glaucoma: Secondary | ICD-10-CM | POA: Diagnosis not present

## 2019-04-01 DIAGNOSIS — I10 Essential (primary) hypertension: Secondary | ICD-10-CM

## 2019-04-01 DIAGNOSIS — J4520 Mild intermittent asthma, uncomplicated: Secondary | ICD-10-CM | POA: Diagnosis not present

## 2019-04-01 DIAGNOSIS — D72819 Decreased white blood cell count, unspecified: Secondary | ICD-10-CM | POA: Diagnosis not present

## 2019-04-01 DIAGNOSIS — D696 Thrombocytopenia, unspecified: Secondary | ICD-10-CM | POA: Diagnosis not present

## 2019-04-01 DIAGNOSIS — K219 Gastro-esophageal reflux disease without esophagitis: Secondary | ICD-10-CM | POA: Diagnosis not present

## 2019-04-01 DIAGNOSIS — E049 Nontoxic goiter, unspecified: Secondary | ICD-10-CM | POA: Diagnosis not present

## 2019-04-01 NOTE — Progress Notes (Signed)
Patient ID: Jose Perry, male   DOB: August 13, 1941, 78 y.o.   MRN: [id number]   Virtual Visit via telephone Note  This visit type was conducted due to national recommendations for restrictions regarding the COVID-19 pandemic (e.g. social distancing).  This format is felt to be most appropriate for this patient at this time.  All issues noted in this document were discussed and addressed.  No physical exam was performed (except for noted visual exam findings with Video Visits).   I connected with [name] by telephone and verified that I am speaking with the correct person using two identifiers. Location patient: home Location provider: work Persons participating in the telephone visit: patient, provider  I discussed the limitations, risks, security and privacy concerns of performing an evaluation and management service by telephone and the availability of in person appointments. The patient expressed understanding and agreed to proceed.   Reason for visit: scheduled follow up.    HPI: He reports he is doing well.  Staying in due to covid restrictions.  No fever.  No cough, chest congestion or sob.  No chest pain.  No acid reflux.  No abdominal pain.  Bowels moving.  Seeing ophthalmology for f/u glaucoma.  Eyes doing better.  Saw Dr Honor Junes 09/26/18.  Stable. Recommended f/u in 12 months.  Breathing better.  Not using breo regularly now.  No urinary retention.  Discussed lab results.  Cholesterol improved.  Discussed calculated cholesterol risk and the recommendation to start a cholesterol medication.  He declines.  Is exercising.     ROS: See pertinent positives and negatives per HPI.  Past Medical History:  Diagnosis Date  . Asthma   . Dyspnea   . Elevated blood pressure   . Enlarged prostate   . GERD (gastroesophageal reflux disease)   . Glaucoma   . History of adenomatous polyp of colon   . Hx of colonic polyps   . Hypertension   . Leukopenia    has had an extensive w/up.     . Liver hemangioma 01/24/2018  . Thyroid goiter     Past Surgical History:  Procedure Laterality Date  . BACK SURGERY  2002   ruptured disc  . COLONOSCOPY W/ POLYPECTOMY  04/24/2005, 07/06/2010, 07/08/2014  . COLONOSCOPY WITH PROPOFOL N/A 02/01/2018   Procedure: COLONOSCOPY WITH PROPOFOL;  Surgeon: Manya Silvas, MD;  Location: [facility] ENDOSCOPY;  Service: Endoscopy;  Laterality: N/A;  . ESOPHAGOGASTRODUODENOSCOPY  04/24/2005, 07/06/2010,  . EYE SURGERY  2009   relieve pressure from glaucoma  . THYROID SURGERY  1968   goiter    Family History  Problem Relation Age of Onset  . Heart disease Mother   . Alcohol abuse Father   . Hyperlipidemia Father   . Stroke Father     SOCIAL HX: reviewed.    Current Outpatient Medications:  .  albuterol (PROVENTIL HFA;VENTOLIN HFA) 108 (90 Base) MCG/ACT inhaler, TAKE 2 PUFFS BY MOUTH EVERY 6 HOURS AS NEEDED, Disp: 6.7 Inhaler, Rfl: 2 .  amLODipine (NORVASC) 5 MG tablet, TAKE 1 TABLET BY MOUTH EVERY DAY, Disp: 90 tablet, Rfl: 1 .  fluticasone furoate-vilanterol (BREO ELLIPTA) 100-25 MCG/INH AEPB, Inhale 1 puff into the lungs daily., Disp: 180 each, Rfl: 2 .  latanoprost (XALATAN) 0.005 % ophthalmic solution, ADMINISTER 1 DROP TO THE RIGHT EYE NIGHTLY., Disp: , Rfl:  .  omeprazole (PRILOSEC) 40 MG capsule, Take 40 mg daily by mouth. , Disp: , Rfl:  .  tamsulosin (FLOMAX) 0.4 MG CAPS capsule,  Take 1 capsule (0.4 mg total) by mouth daily., Disp: 90 capsule, Rfl: 3 .  vitamin B-12 (CYANOCOBALAMIN) 1000 MCG tablet, Take by mouth., Disp: , Rfl:   EXAM:  GENERAL: alert.  Sounds to be in no acute distress.  Answering questions appropriately.    PSYCH/NEURO: pleasant and cooperative, no obvious depression or anxiety, speech and thought processing grossly intact  ASSESSMENT AND PLAN:  Discussed the following assessment and plan:  Asthma Saw pulmonary.  Breathing better.  Not using breo regularly.  Follow.    GERD (gastroesophageal reflux disease)  Controlled on current regimen.  Follow.    Glaucoma Followed by ophthalmology.   Goiter Evaluated by Dr Honor Junes 09/2018.  Stable.  Recommended f/u in 12 months.    Hypertension Blood pressure has been under good control.  Continue same medication regimen.  Follow pressures.  Follow metabolic panel.    Leukopenia Evaluated by hematology.  Follow cbc.    Thrombocytopenia ([place]) Follow cbc.     I discussed the assessment and treatment plan with the patient. The patient was provided an opportunity to ask questions and all were answered. The patient agreed with the plan and demonstrated an understanding of the instructions.   The patient was advised to call back or seek an in-person evaluation if the symptoms worsen or if the condition fails to improve as anticipated.  I provided 11 minutes of non-face-to-face time during this encounter.   Einar Pheasant, MD

## 2019-04-05 ENCOUNTER — Encounter: Payer: Self-pay | Admitting: Internal Medicine

## 2019-04-05 NOTE — Assessment & Plan Note (Signed)
Evaluated by hematology.  Follow cbc.  

## 2019-04-05 NOTE — Assessment & Plan Note (Signed)
Controlled on current regimen.  Follow.  

## 2019-04-05 NOTE — Assessment & Plan Note (Signed)
Followed by ophthalmology.   

## 2019-04-05 NOTE — Assessment & Plan Note (Signed)
Blood pressure has been under good control.  Continue same medication regimen.  Follow pressures.  Follow metabolic panel.   

## 2019-04-05 NOTE — Assessment & Plan Note (Signed)
Evaluated by Dr Honor Junes 09/2018.  Stable.  Recommended f/u in 12 months.

## 2019-04-05 NOTE — Assessment & Plan Note (Signed)
Saw pulmonary.  Breathing better.  Not using breo regularly.  Follow.

## 2019-04-05 NOTE — Assessment & Plan Note (Signed)
Follow cbc.  

## 2019-06-04 ENCOUNTER — Ambulatory Visit: Payer: Medicare Other | Admitting: Urology

## 2019-06-17 ENCOUNTER — Ambulatory Visit: Payer: Medicare Other | Admitting: Urology

## 2019-06-18 ENCOUNTER — Encounter: Payer: Self-pay | Admitting: Urology

## 2019-06-18 ENCOUNTER — Other Ambulatory Visit: Payer: Self-pay

## 2019-06-18 ENCOUNTER — Ambulatory Visit (INDEPENDENT_AMBULATORY_CARE_PROVIDER_SITE_OTHER): Payer: Medicare Other | Admitting: Urology

## 2019-06-18 VITALS — BP 139/90 | HR 73 | Ht 69.0 in | Wt 161.7 lb

## 2019-06-18 DIAGNOSIS — N401 Enlarged prostate with lower urinary tract symptoms: Secondary | ICD-10-CM

## 2019-06-18 DIAGNOSIS — N138 Other obstructive and reflux uropathy: Secondary | ICD-10-CM | POA: Diagnosis not present

## 2019-06-18 MED ORDER — TAMSULOSIN HCL 0.4 MG PO CAPS: 0.4000 mg | ORAL_CAPSULE | Freq: Every day | ORAL | 3 refills | Status: DC

## 2019-06-18 NOTE — Progress Notes (Signed)
06/18/2019 2:23 PM   Jose Perry [birth year] [id number]  Referring provider: Einar Pheasant, [id number] [place],  [place] [address]  Chief Complaint  Patient presents with  . Benign Prostatic Hypertrophy    Urologic history:  1.  BPH with lower urinary tract symptoms  -Tamsulosin daily  -History of transient elevated PSA; no prior biopsy; PSA returned to baseline.   HPI: 78 y.o. male presents for annual follow-up.  He had an episode of urinary retention November 2019 related to medication and was seen here in follow-up with resolution.  He has stable lower urinary tract symptoms.  He remains on tamsulosin.  Denies dysuria or gross hematuria.  Denies flank, abdominal, pelvic or scrotal pain.   PMH: Past Medical History:  Diagnosis Date  . Asthma   . Dyspnea   . Elevated blood pressure   . Enlarged prostate   . GERD (gastroesophageal reflux disease)   . Glaucoma   . History of adenomatous polyp of colon   . Hx of colonic polyps   . Hypertension   . Leukopenia    has had an extensive w/up.    . Liver hemangioma 01/24/2018  . Thyroid goiter     Surgical History: Past Surgical History:  Procedure Laterality Date  . BACK SURGERY  2002   ruptured disc  . COLONOSCOPY W/ POLYPECTOMY  04/24/2005, 07/06/2010, 07/08/2014  . COLONOSCOPY WITH PROPOFOL N/A 02/01/2018   Procedure: COLONOSCOPY WITH PROPOFOL;  Surgeon: Manya Silvas, MD;  Location: [facility] ENDOSCOPY;  Service: Endoscopy;  Laterality: N/A;  . ESOPHAGOGASTRODUODENOSCOPY  04/24/2005, 07/06/2010,  . EYE SURGERY  2009   relieve pressure from glaucoma  . THYROID SURGERY  1968   goiter    Home Medications:  Allergies as of 06/18/2019      Reactions   Brimonidine Tartrate    Other reaction(s): Eye redness   Brinzolamide-brimonidine    Other reaction(s): Eye redness   Other Other (See Comments)   Simbrinza Eye Drops gives pt Eye redness      Medication List       Accurate as of  June 18, 2019  2:23 PM. If you have any questions, ask your nurse or doctor.        STOP taking these medications   amLODipine 5 MG tablet Commonly known as: NORVASC Stopped by: [name], MD     TAKE these medications   albuterol 108 (90 Base) MCG/ACT inhaler Commonly known as: VENTOLIN HFA TAKE 2 PUFFS BY MOUTH EVERY 6 HOURS AS NEEDED   fluticasone furoate-vilanterol 100-25 MCG/INH Aepb Commonly known as: Breo Ellipta Inhale 1 puff into the lungs daily.   latanoprost 0.005 % ophthalmic solution Commonly known as: XALATAN ADMINISTER 1 DROP TO THE RIGHT EYE NIGHTLY.   omeprazole 40 MG capsule Commonly known as: PRILOSEC Take 40 mg daily by mouth.   tamsulosin 0.4 MG Caps capsule Commonly known as: FLOMAX Take 1 capsule (0.4 mg total) by mouth daily.   vitamin B-12 1000 MCG tablet Commonly known as: CYANOCOBALAMIN Take by mouth.       Allergies:  Allergies  Allergen Reactions  . Brimonidine Tartrate     Other reaction(s): Eye redness  . Brinzolamide-Brimonidine     Other reaction(s): Eye redness  . Other Other (See Comments)    Simbrinza Eye Drops gives pt Eye redness    Family History: Family History  Problem Relation Age of Onset  . Heart disease Mother   . Alcohol abuse Father   .  Hyperlipidemia Father   . Stroke Father     Social History:  reports that he has never smoked. He has never used smokeless tobacco. He reports that he does not drink alcohol or use drugs.  ROS: UROLOGY Frequent Urination?: No Hard to postpone urination?: Yes Burning/pain with urination?: No Get up at night to urinate?: Yes Leakage of urine?: No Urine stream starts and stops?: No Trouble starting stream?: No Do you have to strain to urinate?: No Blood in urine?: No Urinary tract infection?: No Sexually transmitted disease?: No Injury to kidneys or bladder?: No Painful intercourse?: No Weak stream?: No Erection problems?: No Penile pain?: No   Gastrointestinal Nausea?: No Vomiting?: No Indigestion/heartburn?: No Diarrhea?: No Constipation?: No  Constitutional Fever: No Night sweats?: No Weight loss?: No Fatigue?: No  Skin Skin rash/lesions?: No Itching?: No  Eyes Blurred vision?: No Double vision?: No  Ears/Nose/Throat Sore throat?: No Sinus problems?: No  Hematologic/Lymphatic Swollen glands?: No Easy bruising?: No  Cardiovascular Leg swelling?: No  Respiratory Cough?: No Shortness of breath?: No  Endocrine Excessive thirst?: No  Musculoskeletal Back pain?: No Joint pain?: No  Neurological Headaches?: No Dizziness?: No  Psychologic Depression?: No Anxiety?: No  Physical Exam: BP 139/90 (BP Location: Left Arm, Patient Position: Sitting, Cuff Size: Normal)   Pulse 73   Ht 5\' 9"  (1.753 m)   Wt 161 lb 11.2 oz (73.3 kg)   BMI 23.88 kg/m   Constitutional:  Alert and oriented, No acute distress. HEENT: [place] AT, moist mucus membranes.  Trachea midline, no masses. Cardiovascular: No clubbing, cyanosis, or edema. Respiratory: Normal respiratory effort, no increased work of breathing. GI: Abdomen is soft, nontender, nondistended, no abdominal masses GU: No CVA tenderness.  Prostate 50 g, smooth without nodules Lymph: No cervical or inguinal lymphadenopathy. Skin: No rashes, bruises or suspicious lesions. Neurologic: Grossly intact, no focal deficits, moving all 4 extremities. Psychiatric: Normal mood and affect.   Assessment & Plan:    - BPH with obstruction/lower urinary tract symptoms Stable lower urinary tract symptoms on tamsulosin which was refilled.  We discussed current prostate cancer screening guidelines and would recommend discontinuing screening based on his age.  He requested to have his PSA checked this year.   Return in about 1 year (around 06/17/2020) for Recheck.  [name], [place] [address] [place],  [address]  [phone]

## 2019-06-19 DIAGNOSIS — H6123 Impacted cerumen, bilateral: Secondary | ICD-10-CM | POA: Diagnosis not present

## 2019-06-19 DIAGNOSIS — J301 Allergic rhinitis due to pollen: Secondary | ICD-10-CM | POA: Diagnosis not present

## 2019-06-19 LAB — PSA: Prostate Specific Ag, Serum: 3.1 ng/mL (ref 0.0–4.0)

## 2019-06-23 ENCOUNTER — Telehealth: Payer: Self-pay | Admitting: *Deleted

## 2019-06-23 NOTE — Telephone Encounter (Signed)
Notified patient as instructed, patient pleased. Discussed follow-up appointments, patient agrees  

## 2019-06-23 NOTE — Telephone Encounter (Signed)
-----   Message from [name], MD sent at 06/21/2019 12:02 PM EDT ----- PSA stable at 3.1

## 2019-07-11 DIAGNOSIS — H5010 Unspecified exotropia: Secondary | ICD-10-CM | POA: Diagnosis not present

## 2019-07-11 DIAGNOSIS — H2513 Age-related nuclear cataract, bilateral: Secondary | ICD-10-CM | POA: Diagnosis not present

## 2019-07-11 DIAGNOSIS — H401133 Primary open-angle glaucoma, bilateral, severe stage: Secondary | ICD-10-CM | POA: Diagnosis not present

## 2019-08-14 ENCOUNTER — Telehealth: Payer: Self-pay | Admitting: Internal Medicine

## 2019-08-14 NOTE — Telephone Encounter (Signed)
Last OV:  04/01/19  This Rx was originally prescribed by a historical provider.

## 2019-08-14 NOTE — Telephone Encounter (Signed)
Pt called in to request a refill for omeprazole (PRILOSEC) 40 MG capsule - 90 day supply.   Pharmacy:  [facility] [place], [place] [facility] [employer] [employer] [phone] (Phone) [phone] (Fax)

## 2019-08-15 ENCOUNTER — Other Ambulatory Visit: Payer: Self-pay

## 2019-08-15 MED ORDER — OMEPRAZOLE 40 MG PO CPDR: 40.0000 mg | DELAYED_RELEASE_CAPSULE | Freq: Every day | ORAL | 1 refills | Status: DC

## 2019-08-15 NOTE — Telephone Encounter (Signed)
Please confirm with pt he is still taking this medication and this dose.  Who has been refilling if so.  I do not mind refilling if he needs, just need to clarify.

## 2019-08-15 NOTE — Telephone Encounter (Signed)
Patient has been taking this dose daily. Dr Vira Agar was filling but he has retired. I have sent in a 90 day supply per patients request

## 2019-08-22 DIAGNOSIS — Z23 Encounter for immunization: Secondary | ICD-10-CM | POA: Diagnosis not present

## 2019-09-22 ENCOUNTER — Other Ambulatory Visit: Payer: Self-pay | Admitting: Family Medicine

## 2019-09-23 ENCOUNTER — Other Ambulatory Visit: Payer: Self-pay | Admitting: Internal Medicine

## 2019-09-23 NOTE — Telephone Encounter (Signed)
Pt called about needing a refill for medication in note. Please advise and thank you!

## 2019-09-24 ENCOUNTER — Other Ambulatory Visit: Payer: Medicare Other

## 2019-09-24 NOTE — Telephone Encounter (Signed)
Patient aware and clarified that he has been taking with no missed doses.

## 2019-09-24 NOTE — Telephone Encounter (Signed)
This message was sent to me to clarify if pt still taking medication.  I sent in refill and please let pt know was sent in and while on phone - clarify has been taking regularly and no missed doses.  Thanks

## 2019-09-24 NOTE — Telephone Encounter (Signed)
Pt called again about his prescription. Please advise.

## 2019-09-26 ENCOUNTER — Encounter: Payer: Medicare Other | Admitting: Internal Medicine

## 2019-09-26 ENCOUNTER — Other Ambulatory Visit: Payer: Self-pay

## 2019-09-26 ENCOUNTER — Ambulatory Visit (INDEPENDENT_AMBULATORY_CARE_PROVIDER_SITE_OTHER): Payer: Medicare Other

## 2019-09-26 VITALS — Ht 69.0 in | Wt 161.0 lb

## 2019-09-26 DIAGNOSIS — Z0000 Encounter for general adult medical examination without abnormal findings: Secondary | ICD-10-CM | POA: Diagnosis not present

## 2019-09-26 NOTE — Progress Notes (Addendum)
Subjective:   KARLOS MALINA is a 78 y.o. male who presents for Medicare Annual/Subsequent preventive examination.  Review of Systems:  No ROS.  Medicare Wellness Virtual Visit.  Visual/audio telehealth visit, UTA vital signs.   Wt/Ht transferred from previous visit.  See social history for additional risk factors.   Cardiac Risk Factors include: advanced age (>6men, >32 women);male gender;hypertension     Objective:    Vitals: Ht 5\' 9"  (1.753 m)   Wt 161 lb (73 kg)   BMI 23.78 kg/m   Body mass index is 23.78 kg/m.  Advanced Directives 09/26/2019 08/23/2018 07/27/2018 07/22/2018 05/14/2018 05/06/2018 02/01/2018  Does Patient Have a Medical Advance Directive? No No No No No No No  Does patient want to make changes to medical advance directive? - No - Patient declined - - - - -  Would patient like information on creating a medical advance directive? No - Patient declined - - No - Patient declined - - -    Tobacco Social History   Tobacco Use  Smoking Status Never Smoker  Smokeless Tobacco Never Used     Counseling given: Not Answered   Clinical Intake:  Pre-visit preparation completed: Yes        Diabetes: No  How often do you need to have someone help you when you read instructions, pamphlets, or other written materials from your doctor or pharmacy?: 1 - Never  Interpreter Needed?: No     Past Medical History:  Diagnosis Date  . Asthma   . Dyspnea   . Elevated blood pressure   . Enlarged prostate   . GERD (gastroesophageal reflux disease)   . Glaucoma   . History of adenomatous polyp of colon   . Hx of colonic polyps   . Hypertension   . Leukopenia    has had an extensive w/up.    . Liver hemangioma 01/24/2018  . Thyroid goiter    Past Surgical History:  Procedure Laterality Date  . BACK SURGERY  2002   ruptured disc  . COLONOSCOPY W/ POLYPECTOMY  04/24/2005, 07/06/2010, 07/08/2014  . COLONOSCOPY WITH PROPOFOL N/A 02/01/2018   Procedure:  COLONOSCOPY WITH PROPOFOL;  Surgeon: Manya Silvas, MD;  Location: [facility] ENDOSCOPY;  Service: Endoscopy;  Laterality: N/A;  . ESOPHAGOGASTRODUODENOSCOPY  04/24/2005, 07/06/2010,  . EYE SURGERY  2009   relieve pressure from glaucoma  . THYROID SURGERY  1968   goiter   Family History  Problem Relation Age of Onset  . Heart disease Mother   . Alcohol abuse Father   . Hyperlipidemia Father   . Stroke Father    Social History   Socioeconomic History  . Marital status: Married    Spouse name: Not on file  . Number of children: Not on file  . Years of education: Not on file  . Highest education level: Not on file  Occupational History  . Not on file  Tobacco Use  . Smoking status: Never Smoker  . Smokeless tobacco: Never Used  Substance and Sexual Activity  . Alcohol use: No    Alcohol/week: 0.0 standard drinks  . Drug use: No  . Sexual activity: Yes  Other Topics Concern  . Not on file  Social History Narrative  . Not on file   Social Determinants of Health   Financial Resource Strain:   . Difficulty of Paying Living Expenses: Not on file  Food Insecurity:   . Worried About Charity fundraiser in the Last Year: Not on  file  . [place] in the Last Year: Not on file  Transportation Needs:   . Lack of Transportation (Medical): Not on file  . Lack of Transportation (Non-Medical): Not on file  Physical Activity:   . Days of Exercise per Week: Not on file  . Minutes of Exercise per Session: Not on file  Stress:   . Feeling of Stress : Not on file  Social Connections:   . Frequency of Communication with Friends and Family: Not on file  . Frequency of Social Gatherings with Friends and Family: Not on file  . Attends Religious Services: Not on file  . Active Member of Clubs or Organizations: Not on file  . Attends Archivist Meetings: Not on file  . Marital Status: Not on file    Outpatient Encounter Medications as of 09/26/2019  Medication Sig  .  albuterol (VENTOLIN HFA) 108 (90 Base) MCG/ACT inhaler TAKE 2 PUFFS BY MOUTH EVERY 6 HOURS AS NEEDED  . amLODipine (NORVASC) 5 MG tablet TAKE 1 TABLET BY MOUTH EVERY DAY  . fluticasone furoate-vilanterol (BREO ELLIPTA) 100-25 MCG/INH AEPB Inhale 1 puff into the lungs daily.  Marland Kitchen latanoprost (XALATAN) 0.005 % ophthalmic solution ADMINISTER 1 DROP TO THE RIGHT EYE NIGHTLY.  Marland Kitchen omeprazole (PRILOSEC) 40 MG capsule Take 1 capsule (40 mg total) by mouth daily.  . tamsulosin (FLOMAX) 0.4 MG CAPS capsule Take 1 capsule (0.4 mg total) by mouth daily.  . vitamin B-12 (CYANOCOBALAMIN) 1000 MCG tablet Take by mouth.   No facility-administered encounter medications on file as of 09/26/2019.    Activities of Daily Living In your present state of health, do you have any difficulty performing the following activities: 09/26/2019  Hearing? N  Vision? N  Difficulty concentrating or making decisions? N  Walking or climbing stairs? N  Dressing or bathing? N  Doing errands, shopping? N  Preparing Food and eating ? N  Using the Toilet? N  In the past six months, have you accidently leaked urine? N  Do you have problems with loss of bowel control? N  Managing your Medications? N  Managing your Finances? N  Housekeeping or managing your Housekeeping? N  Some recent data might be hidden    Patient Care Team: Einar Pheasant, MD as PCP - General (Internal Medicine)   Assessment:   This is a routine wellness examination for Burt.  Nurse connected with patient 09/26/19 at  8:30 AM EST by a telephone enabled telemedicine application and verified that I am speaking with the correct person using two identifiers. Patient stated full name and DOB. Patient gave permission to continue with virtual visit. Patient's location was at home and Nurse's location was at [place] office.   Patient is alert and oriented x3. Patient denies difficulty focusing or concentrating. Patient likes to read brain stimulation.    Health Maintenance Due: -Tdap vaccine- discussed; to be completed with doctor in visit or local pharmacy.   See completed HM at the end of note.   Eye: Visual acuity not assessed. Virtual visit. Followed by their ophthalmologist.  Dental: Visits every 6 months.    Hearing: Demonstrates normal hearing during visit.  Safety:  Patient feels safe at home- yes Patient does have smoke detectors at home- yes Patient does wear sunscreen or protective clothing when in direct sunlight - yes Patient does wear seat belt when in a moving vehicle - yes Patient drives- yes Adequate lighting in walkways free from debris- yes Grab bars and handrails  used as appropriate- yes Ambulates with an assistive device- no Cell phone on person when ambulating outside of the home- yes  Social: Alcohol intake - no      Smoking history- never   Smokers in home? none Illicit drug use? none  Medication: Taking as directed and without issues.  Self managed - yes   Covid-19: Precautions and sickness symptoms discussed. Wears mask, social distancing, hand hygiene as appropriate.   Activities of Daily Living Patient denies needing assistance with: household chores, feeding themselves, getting from bed to chair, getting to the toilet, bathing/showering, dressing, managing money, or preparing meals.    Discussed the importance of a healthy diet, water intake and the benefits of aerobic exercise.    Physical activity- Overall body exercise, free weights, calisthenics.   Diet:  Regular Water: good intake; 64 ounces  Other Providers Patient Care Team: Einar Pheasant, MD as PCP - General (Internal Medicine)  Exercise Activities and Dietary recommendations Current Exercise Habits: Home exercise routine, Type of exercise: strength training/weights;stretching;calisthenics, Frequency (Times/Week): 7, Intensity: Mild  Goals    . Maintain Healthy Lifestyle     Learn something new, just for fun through  audio(language).           Fall Risk Fall Risk  09/26/2019 08/26/2018 08/23/2018 08/20/2017 06/21/2017  Falls in the past year? 0 0 0 No No  Comment - Emmi Telephone Survey: data to providers prior to load - - -  Follow up Falls prevention discussed - - - -    Timed Get Up and Go Performed: no, virtual visit  Depression Screen PHQ 2/9 Scores 09/26/2019 08/23/2018 08/20/2017 06/21/2017  PHQ - 2 Score 0 0 0 0  PHQ- 9 Score - - 0 -    Cognitive Function MMSE - Mini Mental State Exam 08/23/2018 08/20/2017 08/18/2016  Orientation to time 5 5 5   Orientation to Place 5 5 5   Registration 3 3 3   Attention/ Calculation 5 5 5   Recall 3 3 3   Language- name 2 objects 2 2 2   Language- repeat 1 1 1   Language- follow 3 step command 3 3 3   Language- read & follow direction 1 1 1   Write a sentence 1 1 1   Copy design 1 1 1   Total score 30 30 30      6CIT Screen 09/26/2019 08/18/2016  What Year? 0 points 0 points  What month? 0 points 0 points  What time? 0 points 0 points  Count back from 20 0 points 0 points  Months in reverse 0 points 0 points  Repeat phrase 0 points -  Total Score 0 -    Immunization History  Administered Date(s) Administered  . Influenza, High Dose Seasonal PF 06/13/2016, 08/20/2017, 07/31/2018, 08/22/2019  . Influenza,inj,Quad PF,6+ Mos 07/20/2015  . Pneumococcal Conjugate-13 06/10/2015  . Pneumococcal Polysaccharide-23 06/30/2013  . Zoster 06/30/2013  . Zoster Recombinat (Shingrix) 07/06/2018   Screening Tests Health Maintenance  Topic Date Due  . TETANUS/TDAP  01/25/1960  . INFLUENZA VACCINE  Completed  . PNA vac Low Risk Adult  Completed       Plan:   Keep all routine maintenance appointments.   Next scheduled lab 10/29/19  Cpe 11/03/19 @ 1030  Medicare Attestation I have personally reviewed: The patient's medical and social history Their use of alcohol, tobacco or illicit drugs Their current medications and supplements The patient's  functional ability including ADLs,fall risks, home safety risks, cognitive, and hearing and visual impairment Diet and physical activities Evidence for  depression   I have reviewed and discussed with patient certain preventive protocols, quality metrics, and best practice recommendations.  Varney Biles, LPN  [id number]   Reviewed above information.  Agree with assessment and plan.    Dr Nicki Reaper

## 2019-09-26 NOTE — Patient Instructions (Addendum)
  Jose Perry , Thank you for taking time to come for your Medicare Wellness Visit. I appreciate your ongoing commitment to your health goals. Please review the following plan we discussed and let me know if I can assist you in the future.   These are the goals we discussed: Goals    . Maintain Healthy Lifestyle     Learn something new, just for fun through audio(language).           This is a list of the screening recommended for you and due dates:  Health Maintenance  Topic Date Due  . Tetanus Vaccine  01/25/1961  . Flu Shot  Completed  . Pneumonia vaccines  Completed

## 2019-10-27 ENCOUNTER — Other Ambulatory Visit: Payer: Self-pay

## 2019-10-29 ENCOUNTER — Other Ambulatory Visit (INDEPENDENT_AMBULATORY_CARE_PROVIDER_SITE_OTHER): Payer: Medicare Other

## 2019-10-29 ENCOUNTER — Other Ambulatory Visit: Payer: Self-pay

## 2019-10-29 DIAGNOSIS — E049 Nontoxic goiter, unspecified: Secondary | ICD-10-CM

## 2019-10-29 DIAGNOSIS — I10 Essential (primary) hypertension: Secondary | ICD-10-CM | POA: Diagnosis not present

## 2019-10-29 DIAGNOSIS — D696 Thrombocytopenia, unspecified: Secondary | ICD-10-CM

## 2019-10-29 LAB — HEPATIC FUNCTION PANEL
ALT: 16 U/L (ref 0–53)
AST: 29 U/L (ref 0–37)
Albumin: 4.4 g/dL (ref 3.5–5.2)
Alkaline Phosphatase: 85 U/L (ref 39–117)
Bilirubin, Direct: 0.1 mg/dL (ref 0.0–0.3)
Total Bilirubin: 0.5 mg/dL (ref 0.2–1.2)
Total Protein: 7 g/dL (ref 6.0–8.3)

## 2019-10-29 LAB — LIPID PANEL
Cholesterol: 171 mg/dL (ref 0–200)
HDL: 73.4 mg/dL (ref >39.00)
LDL Cholesterol: 91 mg/dL (ref 0–99)
NonHDL: 97.27
Total CHOL/HDL Ratio: 2
Triglycerides: 31 mg/dL (ref 0.0–149.0)
VLDL: 6.2 mg/dL (ref 0.0–40.0)

## 2019-10-29 LAB — BASIC METABOLIC PANEL
BUN: 14 mg/dL (ref 6–23)
CO2: 29 mEq/L (ref 19–32)
Calcium: 9.7 mg/dL (ref 8.4–10.5)
Chloride: 103 mEq/L (ref 96–112)
Creatinine, Ser: 1.42 mg/dL (ref 0.40–1.50)
GFR: 58.38 mL/min — ABNORMAL LOW (ref >60.00)
Glucose, Bld: 89 mg/dL (ref 70–99)
Potassium: 4.4 mEq/L (ref 3.5–5.1)
Sodium: 138 mEq/L (ref 135–145)

## 2019-10-29 LAB — CBC WITH DIFFERENTIAL/PLATELET
Basophils Absolute: 0 10*3/uL (ref 0.0–0.1)
Basophils Relative: 1.1 % (ref 0.0–3.0)
Eosinophils Absolute: 0.1 10*3/uL (ref 0.0–0.7)
Eosinophils Relative: 2.8 % (ref 0.0–5.0)
HCT: 41.6 % (ref 39.0–52.0)
Hemoglobin: 13.4 g/dL (ref 13.0–17.0)
Lymphocytes Relative: 39.9 % (ref 12.0–46.0)
Lymphs Abs: 1.2 10*3/uL (ref 0.7–4.0)
MCHC: 32.1 g/dL (ref 30.0–36.0)
MCV: 83.6 fl (ref 78.0–100.0)
Monocytes Absolute: 0.3 10*3/uL (ref 0.1–1.0)
Monocytes Relative: 10.8 % (ref 3.0–12.0)
Neutro Abs: 1.3 10*3/uL — ABNORMAL LOW (ref 1.4–7.7)
Neutrophils Relative %: 45.4 % (ref 43.0–77.0)
Platelets: 132 10*3/uL — ABNORMAL LOW (ref 150.0–400.0)
RBC: 4.98 Mil/uL (ref 4.22–5.81)
RDW: 14.9 % (ref 11.5–15.5)
WBC: 3 10*3/uL — ABNORMAL LOW (ref 4.0–10.5)

## 2019-10-29 LAB — TSH: TSH: 2.74 u[IU]/mL (ref 0.35–4.50)

## 2019-11-03 ENCOUNTER — Other Ambulatory Visit: Payer: Self-pay

## 2019-11-03 ENCOUNTER — Ambulatory Visit (INDEPENDENT_AMBULATORY_CARE_PROVIDER_SITE_OTHER): Payer: Medicare Other | Admitting: Internal Medicine

## 2019-11-03 VITALS — BP 130/80 | Ht 70.0 in | Wt 161.0 lb

## 2019-11-03 DIAGNOSIS — J4520 Mild intermittent asthma, uncomplicated: Secondary | ICD-10-CM | POA: Diagnosis not present

## 2019-11-03 DIAGNOSIS — I10 Essential (primary) hypertension: Secondary | ICD-10-CM

## 2019-11-03 DIAGNOSIS — D72819 Decreased white blood cell count, unspecified: Secondary | ICD-10-CM | POA: Diagnosis not present

## 2019-11-03 DIAGNOSIS — E041 Nontoxic single thyroid nodule: Secondary | ICD-10-CM

## 2019-11-03 DIAGNOSIS — D696 Thrombocytopenia, unspecified: Secondary | ICD-10-CM

## 2019-11-03 DIAGNOSIS — K219 Gastro-esophageal reflux disease without esophagitis: Secondary | ICD-10-CM | POA: Diagnosis not present

## 2019-11-03 DIAGNOSIS — N401 Enlarged prostate with lower urinary tract symptoms: Secondary | ICD-10-CM

## 2019-11-03 MED ORDER — OMEPRAZOLE 40 MG PO CPDR: 40.0000 mg | DELAYED_RELEASE_CAPSULE | Freq: Every day | ORAL | 1 refills | Status: DC

## 2019-11-03 NOTE — Progress Notes (Signed)
Pre visit review using our clinic review tool, if applicable. No additional management support is needed unless otherwise documented below in the visit note. 

## 2019-11-03 NOTE — Progress Notes (Signed)
Patient ID: Jose Perry, male   DOB: 1941/01/30, 79 y.o.   MRN: [id number]   Virtual Visit via video Note  This visit type was conducted due to national recommendations for restrictions regarding the COVID-19 pandemic (e.g. social distancing).  This format is felt to be most appropriate for this patient at this time.  All issues noted in this document were discussed and addressed.  No physical exam was performed (except for noted visual exam findings with Video Visits).   I connected with [name] by a video enabled telemedicine application and verified that I am speaking with the correct person using two identifiers. Location patient: home Location provider: work  Persons participating in the virtual visit: patient, provider  I discussed the limitations, risks, security and privacy concerns of performing an evaluation and management service by video and the availability of in person appointments.. The patient expressed understanding and agreed to proceed.   Reason for visit: scheduled follow up.   HPI: He reports he is doing well.  Feels good.  Exercises regularly.  No chest pain or tightness with increased activity or exertion.  Breathing better.  Does not use breo regularly.  Does not feel he needs.  No acid reflux.  States things are controlled on omeprazole.  No abdominal pain. Bowels moving.  States has two bowel movements per day.  Normal for him.  States blood pressure is averaging 126/84. States occasionally will wake up and bp will read 140, but comes quickly down after exercise.  Reports - stays hydrated.  Discussed labs.  Discussed taking oral b12.     ROS: See pertinent positives and negatives per HPI.  Past Medical History:  Diagnosis Date  . Asthma   . Dyspnea   . Elevated blood pressure   . Enlarged prostate   . GERD (gastroesophageal reflux disease)   . Glaucoma   . History of adenomatous polyp of colon   . Hx of colonic polyps   . Hypertension   . Leukopenia      has had an extensive w/up.    . Liver hemangioma 01/24/2018  . Thyroid goiter     Past Surgical History:  Procedure Laterality Date  . BACK SURGERY  2002   ruptured disc  . COLONOSCOPY W/ POLYPECTOMY  04/24/2005, 07/06/2010, 07/08/2014  . COLONOSCOPY WITH PROPOFOL N/A 02/01/2018   Procedure: COLONOSCOPY WITH PROPOFOL;  Surgeon: Manya Silvas, MD;  Location: [facility] ENDOSCOPY;  Service: Endoscopy;  Laterality: N/A;  . ESOPHAGOGASTRODUODENOSCOPY  04/24/2005, 07/06/2010,  . EYE SURGERY  2009   relieve pressure from glaucoma  . THYROID SURGERY  1968   goiter    Family History  Problem Relation Age of Onset  . Heart disease Mother   . Alcohol abuse Father   . Hyperlipidemia Father   . Stroke Father     SOCIAL HX: reviewed.    Current Outpatient Medications:  .  albuterol (VENTOLIN HFA) 108 (90 Base) MCG/ACT inhaler, TAKE 2 PUFFS BY MOUTH EVERY 6 HOURS AS NEEDED, Disp: 18 g, Rfl: 2 .  amLODipine (NORVASC) 5 MG tablet, TAKE 1 TABLET BY MOUTH EVERY DAY, Disp: 90 tablet, Rfl: 1 .  fluticasone furoate-vilanterol (BREO ELLIPTA) 100-25 MCG/INH AEPB, Inhale 1 puff into the lungs daily., Disp: 180 each, Rfl: 2 .  latanoprost (XALATAN) 0.005 % ophthalmic solution, ADMINISTER 1 DROP TO THE RIGHT EYE NIGHTLY., Disp: , Rfl:  .  omeprazole (PRILOSEC) 40 MG capsule, Take 1 capsule (40 mg total) by mouth daily., Disp: 90  capsule, Rfl: 1 .  tamsulosin (FLOMAX) 0.4 MG CAPS capsule, Take 1 capsule (0.4 mg total) by mouth daily., Disp: 90 capsule, Rfl: 3 .  vitamin B-12 (CYANOCOBALAMIN) 1000 MCG tablet, Take by mouth., Disp: , Rfl:   EXAM:  VITALS per patient if applicable: [id number]  GENERAL: alert, oriented, appears well and in no acute distress  HEENT: atraumatic, conjunttiva clear, no obvious abnormalities on inspection of external nose and ears  NECK: normal movements of the head and neck  LUNGS: on inspection no signs of respiratory distress, breathing rate appears normal, no obvious gross  SOB, gasping or wheezing  CV: no obvious cyanosis  PSYCH/NEURO: pleasant and cooperative, no obvious depression or anxiety, speech and thought processing grossly intact  ASSESSMENT AND PLAN:  Discussed the following assessment and plan:  Asthma Saw pulmonary.  Not using breo regularly.  Does not feel needs.  Follow.    GERD (gastroesophageal reflux disease) Controlled on prilosec.    Hypertension Blood pressure under good control.  Continue same medication regimen.  Follow pressures.  Follow metabolic panel.    Leukopenia Has been evaluated by hematology.  Recent slight decrease white blood cell count and platelet count.  Follow cbc.  Recheck with next labs.    Thrombocytopenia ([place]) Slightly decreased platelet count noted on most recent labs.  Recheck cbc with next lab.    Thyroid nodule Evaluated by endocrinology.  Last check 09/24/19 - multinodular goiter. Stable.  Recommended f/u in one year.    Hyperplasia of prostate with lower urinary tract symptoms (LUTS) Sees urology.  On flomax.  Stable.    Orders Placed This Encounter  Procedures  . CBC with Differential/Platelet    Standing Status:   Future    Standing Expiration Date:   11/02/2020  . Basic metabolic panel    Standing Status:   Future    Standing Expiration Date:   11/02/2020    Meds ordered this encounter  Medications  . omeprazole (PRILOSEC) 40 MG capsule    Sig: Take 1 capsule (40 mg total) by mouth daily.    Dispense:  90 capsule    Refill:  1     I discussed the assessment and treatment plan with the patient. The patient was provided an opportunity to ask questions and all were answered. The patient agreed with the plan and demonstrated an understanding of the instructions.   The patient was advised to call back or seek an in-person evaluation if the symptoms worsen or if the condition fails to improve as anticipated.   Einar Pheasant, MD

## 2019-11-08 ENCOUNTER — Encounter: Payer: Self-pay | Admitting: Internal Medicine

## 2019-11-08 NOTE — Assessment & Plan Note (Signed)
Has been evaluated by hematology.  Recent slight decrease white blood cell count and platelet count.  Follow cbc.  Recheck with next labs.

## 2019-11-08 NOTE — Assessment & Plan Note (Signed)
Evaluated by endocrinology.  Last check 09/24/19 - multinodular goiter.  Stable.  Recommended f/u in one year.   

## 2019-11-08 NOTE — Assessment & Plan Note (Signed)
Slightly decreased platelet count noted on most recent labs.  Recheck cbc with next lab.

## 2019-11-08 NOTE — Assessment & Plan Note (Signed)
Blood pressure under good control.  Continue same medication regimen.  Follow pressures.  Follow metabolic panel.   

## 2019-11-08 NOTE — Assessment & Plan Note (Signed)
Sees urology.  On flomax.  Stable.

## 2019-11-08 NOTE — Assessment & Plan Note (Signed)
Saw pulmonary.  Not using breo regularly.  Does not feel needs.  Follow.

## 2019-11-08 NOTE — Assessment & Plan Note (Signed)
Controlled on prilosec.   

## 2019-11-11 DIAGNOSIS — H5010 Unspecified exotropia: Secondary | ICD-10-CM | POA: Diagnosis not present

## 2019-11-11 DIAGNOSIS — H2513 Age-related nuclear cataract, bilateral: Secondary | ICD-10-CM | POA: Diagnosis not present

## 2019-11-11 DIAGNOSIS — H401133 Primary open-angle glaucoma, bilateral, severe stage: Secondary | ICD-10-CM | POA: Diagnosis not present

## 2019-12-03 ENCOUNTER — Other Ambulatory Visit: Payer: Self-pay

## 2019-12-03 ENCOUNTER — Other Ambulatory Visit (INDEPENDENT_AMBULATORY_CARE_PROVIDER_SITE_OTHER): Payer: Medicare Other

## 2019-12-03 DIAGNOSIS — D696 Thrombocytopenia, unspecified: Secondary | ICD-10-CM | POA: Diagnosis not present

## 2019-12-03 DIAGNOSIS — I10 Essential (primary) hypertension: Secondary | ICD-10-CM | POA: Diagnosis not present

## 2019-12-03 LAB — BASIC METABOLIC PANEL
BUN: 11 mg/dL (ref 6–23)
CO2: 29 mEq/L (ref 19–32)
Calcium: 9.7 mg/dL (ref 8.4–10.5)
Chloride: 102 mEq/L (ref 96–112)
Creatinine, Ser: 1.33 mg/dL (ref 0.40–1.50)
GFR: 62.95 mL/min (ref >60.00)
Glucose, Bld: 79 mg/dL (ref 70–99)
Potassium: 4.1 mEq/L (ref 3.5–5.1)
Sodium: 137 mEq/L (ref 135–145)

## 2019-12-03 LAB — CBC WITH DIFFERENTIAL/PLATELET
Basophils Absolute: 0 10*3/uL (ref 0.0–0.1)
Basophils Relative: 1.3 % (ref 0.0–3.0)
Eosinophils Absolute: 0.1 10*3/uL (ref 0.0–0.7)
Eosinophils Relative: 2.4 % (ref 0.0–5.0)
HCT: 40.1 % (ref 39.0–52.0)
Hemoglobin: 12.9 g/dL — ABNORMAL LOW (ref 13.0–17.0)
Lymphocytes Relative: 33.2 % (ref 12.0–46.0)
Lymphs Abs: 0.8 10*3/uL (ref 0.7–4.0)
MCHC: 32 g/dL (ref 30.0–36.0)
MCV: 82.8 fl (ref 78.0–100.0)
Monocytes Absolute: 0.2 10*3/uL (ref 0.1–1.0)
Monocytes Relative: 10.2 % (ref 3.0–12.0)
Neutro Abs: 1.3 10*3/uL — ABNORMAL LOW (ref 1.4–7.7)
Neutrophils Relative %: 52.9 % (ref 43.0–77.0)
Platelets: 147 10*3/uL — ABNORMAL LOW (ref 150.0–400.0)
RBC: 4.85 Mil/uL (ref 4.22–5.81)
RDW: 14.9 % (ref 11.5–15.5)
WBC: 2.4 10*3/uL — ABNORMAL LOW (ref 4.0–10.5)

## 2019-12-05 ENCOUNTER — Other Ambulatory Visit: Payer: Self-pay | Admitting: Internal Medicine

## 2019-12-05 DIAGNOSIS — D696 Thrombocytopenia, unspecified: Secondary | ICD-10-CM

## 2019-12-05 DIAGNOSIS — D649 Anemia, unspecified: Secondary | ICD-10-CM

## 2019-12-05 NOTE — Progress Notes (Signed)
Order placed for f/u labs.  

## 2019-12-18 DIAGNOSIS — J301 Allergic rhinitis due to pollen: Secondary | ICD-10-CM | POA: Diagnosis not present

## 2019-12-18 DIAGNOSIS — H6123 Impacted cerumen, bilateral: Secondary | ICD-10-CM | POA: Diagnosis not present

## 2020-01-08 ENCOUNTER — Other Ambulatory Visit (INDEPENDENT_AMBULATORY_CARE_PROVIDER_SITE_OTHER): Payer: Medicare Other

## 2020-01-08 ENCOUNTER — Other Ambulatory Visit: Payer: Self-pay

## 2020-01-08 DIAGNOSIS — D696 Thrombocytopenia, unspecified: Secondary | ICD-10-CM

## 2020-01-08 DIAGNOSIS — D649 Anemia, unspecified: Secondary | ICD-10-CM | POA: Diagnosis not present

## 2020-01-08 LAB — CBC WITH DIFFERENTIAL/PLATELET
Basophils Absolute: 0 10*3/uL (ref 0.0–0.1)
Basophils Relative: 1.1 % (ref 0.0–3.0)
Eosinophils Absolute: 0 10*3/uL (ref 0.0–0.7)
Eosinophils Relative: 1.8 % (ref 0.0–5.0)
HCT: 40.7 % (ref 39.0–52.0)
Hemoglobin: 13.2 g/dL (ref 13.0–17.0)
Lymphocytes Relative: 29.2 % (ref 12.0–46.0)
Lymphs Abs: 0.7 10*3/uL (ref 0.7–4.0)
MCHC: 32.4 g/dL (ref 30.0–36.0)
MCV: 81.9 fl (ref 78.0–100.0)
Monocytes Absolute: 0.2 10*3/uL (ref 0.1–1.0)
Monocytes Relative: 8.2 % (ref 3.0–12.0)
Neutro Abs: 1.4 10*3/uL (ref 1.4–7.7)
Neutrophils Relative %: 59.7 % (ref 43.0–77.0)
Platelets: 129 10*3/uL — ABNORMAL LOW (ref 150.0–400.0)
RBC: 4.97 Mil/uL (ref 4.22–5.81)
RDW: 14.9 % (ref 11.5–15.5)
WBC: 2.4 10*3/uL — ABNORMAL LOW (ref 4.0–10.5)

## 2020-01-08 LAB — IBC + FERRITIN
Ferritin: 19.3 ng/mL — ABNORMAL LOW (ref 22.0–322.0)
Iron: 52 ug/dL (ref 42–165)
Saturation Ratios: 13.7 % — ABNORMAL LOW (ref 20.0–50.0)
Transferrin: 272 mg/dL (ref 212.0–360.0)

## 2020-01-08 LAB — VITAMIN B12: Vitamin B-12: 1245 pg/mL — ABNORMAL HIGH (ref 211–911)

## 2020-01-13 ENCOUNTER — Telehealth: Payer: Self-pay | Admitting: Internal Medicine

## 2020-01-13 NOTE — Telephone Encounter (Signed)
Pt would like a call back about lab work

## 2020-01-13 NOTE — Telephone Encounter (Signed)
See result note.  

## 2020-01-14 ENCOUNTER — Other Ambulatory Visit: Payer: Self-pay | Admitting: Internal Medicine

## 2020-01-14 DIAGNOSIS — E611 Iron deficiency: Secondary | ICD-10-CM

## 2020-01-14 NOTE — Progress Notes (Signed)
Order placed for GI referral.   

## 2020-03-02 DIAGNOSIS — K219 Gastro-esophageal reflux disease without esophagitis: Secondary | ICD-10-CM | POA: Diagnosis not present

## 2020-03-02 DIAGNOSIS — E611 Iron deficiency: Secondary | ICD-10-CM | POA: Diagnosis not present

## 2020-03-05 DIAGNOSIS — E611 Iron deficiency: Secondary | ICD-10-CM | POA: Diagnosis not present

## 2020-03-16 ENCOUNTER — Other Ambulatory Visit: Payer: Self-pay | Admitting: Internal Medicine

## 2020-04-14 ENCOUNTER — Other Ambulatory Visit: Payer: Self-pay | Admitting: Urology

## 2020-04-14 DIAGNOSIS — N138 Other obstructive and reflux uropathy: Secondary | ICD-10-CM

## 2020-05-05 ENCOUNTER — Other Ambulatory Visit: Payer: Self-pay

## 2020-05-05 ENCOUNTER — Ambulatory Visit (INDEPENDENT_AMBULATORY_CARE_PROVIDER_SITE_OTHER): Payer: Medicare Other | Admitting: Internal Medicine

## 2020-05-05 VITALS — BP 120/78 | HR 55 | Temp 97.6°F | Resp 16 | Ht 70.0 in | Wt 161.0 lb

## 2020-05-05 DIAGNOSIS — Z86010 Personal history of colonic polyps: Secondary | ICD-10-CM | POA: Diagnosis not present

## 2020-05-05 DIAGNOSIS — D696 Thrombocytopenia, unspecified: Secondary | ICD-10-CM

## 2020-05-05 DIAGNOSIS — E041 Nontoxic single thyroid nodule: Secondary | ICD-10-CM

## 2020-05-05 DIAGNOSIS — R972 Elevated prostate specific antigen [PSA]: Secondary | ICD-10-CM | POA: Diagnosis not present

## 2020-05-05 DIAGNOSIS — K219 Gastro-esophageal reflux disease without esophagitis: Secondary | ICD-10-CM

## 2020-05-05 DIAGNOSIS — D72819 Decreased white blood cell count, unspecified: Secondary | ICD-10-CM | POA: Diagnosis not present

## 2020-05-05 DIAGNOSIS — E611 Iron deficiency: Secondary | ICD-10-CM

## 2020-05-05 DIAGNOSIS — Z125 Encounter for screening for malignant neoplasm of prostate: Secondary | ICD-10-CM

## 2020-05-05 DIAGNOSIS — I10 Essential (primary) hypertension: Secondary | ICD-10-CM | POA: Diagnosis not present

## 2020-05-05 LAB — IBC + FERRITIN
Ferritin: 29 ng/mL (ref 22.0–322.0)
Iron: 60 ug/dL (ref 42–165)
Saturation Ratios: 15.8 % — ABNORMAL LOW (ref 20.0–50.0)
Transferrin: 272 mg/dL (ref 212.0–360.0)

## 2020-05-05 LAB — BASIC METABOLIC PANEL
BUN: 15 mg/dL (ref 6–23)
CO2: 28 mEq/L (ref 19–32)
Calcium: 9.9 mg/dL (ref 8.4–10.5)
Chloride: 103 mEq/L (ref 96–112)
Creatinine, Ser: 1.35 mg/dL (ref 0.40–1.50)
GFR: 61.8 mL/min (ref >60.00)
Glucose, Bld: 82 mg/dL (ref 70–99)
Potassium: 4.3 mEq/L (ref 3.5–5.1)
Sodium: 137 mEq/L (ref 135–145)

## 2020-05-05 LAB — CBC WITH DIFFERENTIAL/PLATELET
Basophils Absolute: 0 10*3/uL (ref 0.0–0.1)
Basophils Relative: 1.1 % (ref 0.0–3.0)
Eosinophils Absolute: 0.1 10*3/uL (ref 0.0–0.7)
Eosinophils Relative: 1.7 % (ref 0.0–5.0)
HCT: 40.9 % (ref 39.0–52.0)
Hemoglobin: 13.1 g/dL (ref 13.0–17.0)
Lymphocytes Relative: 29.3 % (ref 12.0–46.0)
Lymphs Abs: 0.8 10*3/uL (ref 0.7–4.0)
MCHC: 32.1 g/dL (ref 30.0–36.0)
MCV: 83 fl (ref 78.0–100.0)
Monocytes Absolute: 0.2 10*3/uL (ref 0.1–1.0)
Monocytes Relative: 7.6 % (ref 3.0–12.0)
Neutro Abs: 1.7 10*3/uL (ref 1.4–7.7)
Neutrophils Relative %: 60.3 % (ref 43.0–77.0)
Platelets: 151 10*3/uL (ref 150.0–400.0)
RBC: 4.92 Mil/uL (ref 4.22–5.81)
RDW: 15.2 % (ref 11.5–15.5)
WBC: 2.9 10*3/uL — ABNORMAL LOW (ref 4.0–10.5)

## 2020-05-05 LAB — LIPID PANEL
Cholesterol: 172 mg/dL (ref 0–200)
HDL: 66.7 mg/dL (ref >39.00)
LDL Cholesterol: 96 mg/dL (ref 0–99)
NonHDL: 104.92
Total CHOL/HDL Ratio: 3
Triglycerides: 44 mg/dL (ref 0.0–149.0)
VLDL: 8.8 mg/dL (ref 0.0–40.0)

## 2020-05-05 LAB — HEPATIC FUNCTION PANEL
ALT: 18 U/L (ref 0–53)
AST: 33 U/L (ref 0–37)
Albumin: 4.3 g/dL (ref 3.5–5.2)
Alkaline Phosphatase: 85 U/L (ref 39–117)
Bilirubin, Direct: 0.2 mg/dL (ref 0.0–0.3)
Total Bilirubin: 0.7 mg/dL (ref 0.2–1.2)
Total Protein: 6.9 g/dL (ref 6.0–8.3)

## 2020-05-05 LAB — PSA, MEDICARE: PSA: 3.48 ng/ml (ref 0.10–4.00)

## 2020-05-05 LAB — TSH: TSH: 1.71 u[IU]/mL (ref 0.35–4.50)

## 2020-05-05 NOTE — Progress Notes (Signed)
Patient ID: Jose Perry, male   DOB: 1940/12/08, 79 y.o.   MRN: [id number]   Subjective:    Patient ID: Jose Perry, male    DOB: Oct 20, 1940, 79 y.o.   MRN: [id number]  HPI This visit occurred during the SARS-CoV-2 public health emergency.  Safety protocols were in place, including screening questions prior to the visit, additional usage of staff PPE, and extensive cleaning of exam room while observing appropriate contact time as indicated for disinfecting solutions.  Patient here for a scheduled follow up.  States he is doing well.  Feels good.  Stays active.  No chest pain or sob reported.  No abdominal pain.  Saw GI 02/2020 for IDA.  Having 2 bowel movements per day.  Some occasional burping. On omeprazole.  Colonoscopy 2019 - unremarkable.   Hemoccult cards negative.  Elected to follow.  Overall he feels things are stable.  Blood pressure stable.     Past Medical History:  Diagnosis Date  . Asthma   . Dyspnea   . Elevated blood pressure   . Enlarged prostate   . GERD (gastroesophageal reflux disease)   . Glaucoma   . History of adenomatous polyp of colon   . Hx of colonic polyps   . Hypertension   . Leukopenia    has had an extensive w/up.    . Liver hemangioma 01/24/2018  . Thyroid goiter    Past Surgical History:  Procedure Laterality Date  . BACK SURGERY  2002   ruptured disc  . COLONOSCOPY W/ POLYPECTOMY  04/24/2005, 07/06/2010, 07/08/2014  . COLONOSCOPY WITH PROPOFOL N/A 02/01/2018   Procedure: COLONOSCOPY WITH PROPOFOL;  Surgeon: Manya Silvas, MD;  Location: [facility] ENDOSCOPY;  Service: Endoscopy;  Laterality: N/A;  . ESOPHAGOGASTRODUODENOSCOPY  04/24/2005, 07/06/2010,  . EYE SURGERY  2009   relieve pressure from glaucoma  . THYROID SURGERY  1968   goiter   Family History  Problem Relation Age of Onset  . Heart disease Mother   . Alcohol abuse Father   . Hyperlipidemia Father   . Stroke Father    Social History   Socioeconomic History  . Marital status:  Married    Spouse name: Not on file  . Number of children: Not on file  . Years of education: Not on file  . Highest education level: Not on file  Occupational History  . Not on file  Tobacco Use  . Smoking status: Never Smoker  . Smokeless tobacco: Never Used  Substance and Sexual Activity  . Alcohol use: No    Alcohol/week: 0.0 standard drinks  . Drug use: No  . Sexual activity: Yes  Other Topics Concern  . Not on file  Social History Narrative  . Not on file   Social Determinants of Health   Financial Resource Strain:   . Difficulty of Paying Living Expenses:   Food Insecurity:   . Worried About Charity fundraiser in the Last Year:   . Arboriculturist in the Last Year:   Transportation Needs:   . Film/video editor (Medical):   Marland Kitchen Lack of Transportation (Non-Medical):   Physical Activity:   . Days of Exercise per Week:   . Minutes of Exercise per Session:   Stress:   . Feeling of Stress :   Social Connections:   . Frequency of Communication with Friends and Family:   . Frequency of Social Gatherings with Friends and Family:   . Attends Religious Services:   .  Active Member of Clubs or Organizations:   . Attends Archivist Meetings:   Marland Kitchen Marital Status:     Outpatient Encounter Medications as of 05/05/2020  Medication Sig  . albuterol (VENTOLIN HFA) 108 (90 Base) MCG/ACT inhaler TAKE 2 PUFFS BY MOUTH EVERY 6 HOURS AS NEEDED  . amLODipine (NORVASC) 5 MG tablet TAKE 1 TABLET BY MOUTH EVERY DAY  . fluticasone furoate-vilanterol (BREO ELLIPTA) 100-25 MCG/INH AEPB Inhale 1 puff into the lungs daily.  Marland Kitchen latanoprost (XALATAN) 0.005 % ophthalmic solution ADMINISTER 1 DROP TO THE RIGHT EYE NIGHTLY.  Marland Kitchen omeprazole (PRILOSEC) 40 MG capsule Take 1 capsule (40 mg total) by mouth daily.  . tamsulosin (FLOMAX) 0.4 MG CAPS capsule TAKE 1 CAPSULE DAILY  . vitamin B-12 (CYANOCOBALAMIN) 1000 MCG tablet Take by mouth.   No facility-administered encounter medications on  file as of 05/05/2020.    Review of Systems  Constitutional: Negative for appetite change and unexpected weight change.  HENT: Negative for congestion and sinus pressure.   Respiratory: Negative for cough, chest tightness and shortness of breath.   Cardiovascular: Negative for chest pain, palpitations and leg swelling.  Gastrointestinal: Negative for abdominal pain, diarrhea, nausea and vomiting.  Genitourinary: Negative for difficulty urinating and dysuria.  Musculoskeletal: Negative for joint swelling and myalgias.  Skin: Negative for color change and rash.  Neurological: Negative for dizziness, light-headedness and headaches.  Psychiatric/Behavioral: Negative for agitation and dysphoric mood.       Objective:    Physical Exam Vitals reviewed.  Constitutional:      General: He is not in acute distress.    Appearance: Normal appearance. He is well-developed.  HENT:     Head: Normocephalic and atraumatic.     Right Ear: External ear normal.     Left Ear: External ear normal.  Eyes:     General: No scleral icterus.       Right eye: No discharge.        Left eye: No discharge.     Conjunctiva/sclera: Conjunctivae normal.  Cardiovascular:     Rate and Rhythm: Normal rate and regular rhythm.  Pulmonary:     Effort: Pulmonary effort is normal. No respiratory distress.     Breath sounds: Normal breath sounds.  Abdominal:     General: Bowel sounds are normal.     Palpations: Abdomen is soft.     Tenderness: There is no abdominal tenderness.  Musculoskeletal:        General: No swelling or tenderness.     Cervical back: Neck supple. No tenderness.  Lymphadenopathy:     Cervical: No cervical adenopathy.  Skin:    Findings: No erythema or rash.  Neurological:     Mental Status: He is alert.  Psychiatric:        Mood and Affect: Mood normal.        Behavior: Behavior normal.     BP 120/78   Pulse 55   Temp 97.6 F (36.4 C)   Resp 16   Ht 5\' 10"  (1.778 m)   Wt 161 lb  (73 kg)   SpO2 99%   BMI 23.10 kg/m  Wt Readings from Last 3 Encounters:  05/05/20 161 lb (73 kg)  11/03/19 161 lb (73 kg)  09/26/19 161 lb (73 kg)     Lab Results  Component Value Date   WBC 2.9 (L) 05/05/2020   HGB 13.1 05/05/2020   HCT 40.9 05/05/2020   PLT 151.0 05/05/2020   GLUCOSE 82 05/05/2020  CHOL 172 05/05/2020   TRIG 44.0 05/05/2020   HDL 66.70 05/05/2020   LDLCALC 96 05/05/2020   ALT 18 05/05/2020   AST 33 05/05/2020   NA 137 05/05/2020   K 4.3 05/05/2020   CL 103 05/05/2020   CREATININE 1.35 05/05/2020   BUN 15 05/05/2020   CO2 28 05/05/2020   TSH 1.71 05/05/2020   PSA 3.48 05/05/2020       Assessment & Plan:   Problem List Items Addressed This Visit    Elevated prostate specific antigen (PSA)    PSA 3.48 on last check.  Recheck psa with next labs.        GERD (gastroesophageal reflux disease)    Some occasional burping. On omeprazole.  Stable.  Just saw GI.  Elected to monitor.        History of colonic polyps    Colonoscopy 01/2018 as outlined.  Just saw GI.       Hypertension    Blood pressure as outlined.  Continue amlodipine.  Follow pressures.  Follow metabolic panel.        Relevant Orders   Hepatic function panel (Completed)   Lipid panel (Completed)   TSH (Completed)   Basic metabolic panel (Completed)   Iron deficiency    Saw GI as outlined.  2019 - colonoscopy.  Hemoccult cards negative.  Elected to follow.  See GI note.       Relevant Orders   IBC + Ferritin (Completed)   Leukopenia - Primary    Has been evaluated by hematology previously.  Follow cbc.       Relevant Orders   CBC with Differential/Platelet (Completed)   Thrombocytopenia (HCC)    Platelet count 151 on recent check.  Follow cbc.       Thyroid nodule    Evaluated by endocrinology.  Last check 09/24/19 - multinodular goiter.  Stable.  Recommended f/u in one year.         Other Visit Diagnoses    Prostate cancer screening       Relevant Orders    PSA, Medicare (Completed)       Einar Pheasant, MD

## 2020-05-16 ENCOUNTER — Encounter: Payer: Self-pay | Admitting: Internal Medicine

## 2020-05-16 NOTE — Assessment & Plan Note (Signed)
Platelet count 151 on recent check.  Follow cbc.

## 2020-05-16 NOTE — Assessment & Plan Note (Signed)
Has been evaluated by hematology previously.  Follow cbc.

## 2020-05-16 NOTE — Assessment & Plan Note (Signed)
Some occasional burping. On omeprazole.  Stable.  Just saw GI.  Elected to monitor.

## 2020-05-16 NOTE — Assessment & Plan Note (Signed)
Evaluated by endocrinology.  Last check 09/24/19 - multinodular goiter.  Stable.  Recommended f/u in one year.

## 2020-05-16 NOTE — Assessment & Plan Note (Signed)
PSA 3.48 on last check.  Recheck psa with next labs.

## 2020-05-16 NOTE — Assessment & Plan Note (Signed)
Blood pressure as outlined.  Continue amlodipine.   Follow pressures.  Follow metabolic panel.  

## 2020-05-16 NOTE — Assessment & Plan Note (Signed)
Saw GI as outlined.  2019 - colonoscopy.  Hemoccult cards negative.  Elected to follow.  See GI note.

## 2020-05-16 NOTE — Assessment & Plan Note (Signed)
Colonoscopy 01/2018 as outlined.  Just saw GI.

## 2020-06-16 DIAGNOSIS — H2511 Age-related nuclear cataract, right eye: Secondary | ICD-10-CM | POA: Diagnosis not present

## 2020-06-17 ENCOUNTER — Ambulatory Visit: Payer: Medicare Other | Admitting: Urology

## 2020-06-18 DIAGNOSIS — H6123 Impacted cerumen, bilateral: Secondary | ICD-10-CM | POA: Diagnosis not present

## 2020-06-18 DIAGNOSIS — K121 Other forms of stomatitis: Secondary | ICD-10-CM | POA: Diagnosis not present

## 2020-06-23 ENCOUNTER — Other Ambulatory Visit: Payer: Self-pay

## 2020-06-23 ENCOUNTER — Ambulatory Visit (INDEPENDENT_AMBULATORY_CARE_PROVIDER_SITE_OTHER): Payer: Medicare Other | Admitting: Urology

## 2020-06-23 ENCOUNTER — Encounter: Payer: Self-pay | Admitting: Urology

## 2020-06-23 VITALS — BP 118/68 | HR 69 | Ht 65.0 in | Wt 160.0 lb

## 2020-06-23 DIAGNOSIS — N138 Other obstructive and reflux uropathy: Secondary | ICD-10-CM

## 2020-06-23 DIAGNOSIS — N401 Enlarged prostate with lower urinary tract symptoms: Secondary | ICD-10-CM

## 2020-06-23 LAB — BLADDER SCAN AMB NON-IMAGING: Scan Result: 56

## 2020-06-23 MED ORDER — TAMSULOSIN HCL 0.4 MG PO CAPS: 0.4000 mg | ORAL_CAPSULE | Freq: Every day | ORAL | 3 refills | Status: DC

## 2020-06-23 NOTE — Progress Notes (Signed)
06/23/2020 2:21 PM   Jose Perry 06/15/1941 [id number]  Referring provider: Einar Pheasant, [address] [place],  [place] [address]  Chief Complaint  Patient presents with  . Benign Prostatic Hypertrophy    Urologic history:  1.  BPH with lower urinary tract symptoms             -Tamsulosin daily             -History of transient elevated PSA; no prior biopsy; PSA returned to baseline.  HPI: 79 y.o. male presents for annual follow-up.   Remains on tamsulosin with stable LUTS  Denies dysuria, gross hematuria  Denies flank, abdominal or pelvic pain  No further episodes of acute urinary retention   PMH: Past Medical History:  Diagnosis Date  . Asthma   . Dyspnea   . Elevated blood pressure   . Enlarged prostate   . GERD (gastroesophageal reflux disease)   . Glaucoma   . History of adenomatous polyp of colon   . Hx of colonic polyps   . Hypertension   . Leukopenia    has had an extensive w/up.    . Liver hemangioma 01/24/2018  . Thyroid goiter     Surgical History: Past Surgical History:  Procedure Laterality Date  . BACK SURGERY  2002   ruptured disc  . COLONOSCOPY W/ POLYPECTOMY  04/24/2005, 07/06/2010, 07/08/2014  . COLONOSCOPY WITH PROPOFOL N/A 02/01/2018   Procedure: COLONOSCOPY WITH PROPOFOL;  Surgeon: Manya Silvas, MD;  Location: [facility] ENDOSCOPY;  Service: Endoscopy;  Laterality: N/A;  . ESOPHAGOGASTRODUODENOSCOPY  04/24/2005, 07/06/2010,  . EYE SURGERY  2009   relieve pressure from glaucoma  . THYROID SURGERY  1968   goiter    Home Medications:  Allergies as of 06/23/2020      Reactions   Brimonidine Tartrate    Other reaction(s): Eye redness   Brinzolamide-brimonidine    Other reaction(s): Eye redness   Other Other (See Comments)   Simbrinza Eye Drops gives pt Eye redness      Medication List       Accurate as of June 23, 2020  2:21 PM. If you have any questions, ask your nurse or doctor.          albuterol 108 (90 Base) MCG/ACT inhaler Commonly known as: VENTOLIN HFA TAKE 2 PUFFS BY MOUTH EVERY 6 HOURS AS NEEDED   amLODipine 5 MG tablet Commonly known as: NORVASC TAKE 1 TABLET BY MOUTH EVERY DAY   fluticasone furoate-vilanterol 100-25 MCG/INH Aepb Commonly known as: Breo Ellipta Inhale 1 puff into the lungs daily.   latanoprost 0.005 % ophthalmic solution Commonly known as: XALATAN ADMINISTER 1 DROP TO THE RIGHT EYE NIGHTLY.   omeprazole 40 MG capsule Commonly known as: PRILOSEC Take 1 capsule (40 mg total) by mouth daily.   tamsulosin 0.4 MG Caps capsule Commonly known as: FLOMAX TAKE 1 CAPSULE DAILY   vitamin B-12 1000 MCG tablet Commonly known as: CYANOCOBALAMIN Take by mouth.       Allergies:  Allergies  Allergen Reactions  . Brimonidine Tartrate     Other reaction(s): Eye redness  . Brinzolamide-Brimonidine     Other reaction(s): Eye redness  . Other Other (See Comments)    Simbrinza Eye Drops gives pt Eye redness    Family History: Family History  Problem Relation Age of Onset  . Heart disease Mother   . Alcohol abuse Father   . Hyperlipidemia Father   . Stroke Father  Social History:  reports that he has never smoked. He has never used smokeless tobacco. He reports that he does not drink alcohol and does not use drugs.   Physical Exam: BP 118/68   Pulse 69   Ht 5\' 5"  (1.651 m)   Wt 160 lb (72.6 kg)   BMI 26.63 kg/m   Constitutional:  Alert and oriented, No acute distress. HEENT: [place] AT, moist mucus membranes.  Trachea midline, no masses. Cardiovascular: No clubbing, cyanosis, or edema. Respiratory: Normal respiratory effort, no increased work of breathing. GI: Abdomen is soft, nontender, nondistended, no abdominal masses GU: Prostate 50 g, smooth without nodules Skin: No rashes, bruises or suspicious lesions. Neurologic: Grossly intact, no focal deficits, moving all 4 extremities. Psychiatric: Normal mood and  affect.   Assessment & Plan:    1. BPH with obstruction/lower urinary tract symptoms  Stable LUTS on tamsulosin  Tamsulosin refill sent  Continue annual follow-up   [name], MD  [place] [address] [place], [place] [address] [phone]

## 2020-06-24 ENCOUNTER — Encounter: Payer: Self-pay | Admitting: Ophthalmology

## 2020-06-24 ENCOUNTER — Other Ambulatory Visit: Payer: Self-pay

## 2020-06-25 ENCOUNTER — Other Ambulatory Visit: Payer: Self-pay | Admitting: Internal Medicine

## 2020-06-28 ENCOUNTER — Other Ambulatory Visit
Admission: RE | Admit: 2020-06-28 | Discharge: 2020-06-28 | Disposition: A | Discharge status: Home / Self Care | Payer: Medicare Other | Source: Ambulatory Visit | Attending: Ophthalmology | Admitting: Ophthalmology

## 2020-06-28 ENCOUNTER — Other Ambulatory Visit: Payer: Self-pay

## 2020-06-28 DIAGNOSIS — Z01812 Encounter for preprocedural laboratory examination: Secondary | ICD-10-CM | POA: Diagnosis not present

## 2020-06-28 DIAGNOSIS — Z20822 Contact with and (suspected) exposure to covid-19: Secondary | ICD-10-CM | POA: Diagnosis not present

## 2020-06-28 NOTE — Discharge Instructions (Signed)

## 2020-06-29 LAB — SARS CORONAVIRUS 2 (TAT 6-24 HRS): SARS Coronavirus 2: NEGATIVE

## 2020-06-30 ENCOUNTER — Encounter: Payer: Self-pay | Admitting: Ophthalmology

## 2020-06-30 ENCOUNTER — Ambulatory Visit: Payer: Medicare Other | Admitting: Anesthesiology

## 2020-06-30 ENCOUNTER — Other Ambulatory Visit: Payer: Self-pay

## 2020-06-30 ENCOUNTER — Encounter
Admission: RE | Disposition: A | Discharge status: Home / Self Care | Payer: Self-pay | Source: Home / Self Care | Attending: Ophthalmology

## 2020-06-30 ENCOUNTER — Ambulatory Visit
Admission: RE | Admit: 2020-06-30 | Discharge: 2020-06-30 | Disposition: A | Discharge status: Home / Self Care | Payer: Medicare Other | Attending: Ophthalmology | Admitting: Ophthalmology

## 2020-06-30 DIAGNOSIS — H5703 Miosis: Secondary | ICD-10-CM | POA: Insufficient documentation

## 2020-06-30 DIAGNOSIS — K219 Gastro-esophageal reflux disease without esophagitis: Secondary | ICD-10-CM | POA: Diagnosis not present

## 2020-06-30 DIAGNOSIS — H2511 Age-related nuclear cataract, right eye: Secondary | ICD-10-CM | POA: Diagnosis not present

## 2020-06-30 DIAGNOSIS — J45909 Unspecified asthma, uncomplicated: Secondary | ICD-10-CM | POA: Insufficient documentation

## 2020-06-30 DIAGNOSIS — I10 Essential (primary) hypertension: Secondary | ICD-10-CM | POA: Diagnosis not present

## 2020-06-30 DIAGNOSIS — Z79899 Other long term (current) drug therapy: Secondary | ICD-10-CM | POA: Diagnosis not present

## 2020-06-30 DIAGNOSIS — H25811 Combined forms of age-related cataract, right eye: Secondary | ICD-10-CM | POA: Diagnosis not present

## 2020-06-30 HISTORY — PX: CATARACT EXTRACTION W/PHACO: SHX586

## 2020-06-30 SURGERY — PHACOEMULSIFICATION, CATARACT, WITH IOL INSERTION
Anesthesia: Monitor Anesthesia Care | Site: Eye | Laterality: Right

## 2020-06-30 MED ORDER — LIDOCAINE HCL (PF) 2 % IJ SOLN
INTRAOCULAR | Status: DC | PRN
  Administered 2020-06-30: 2 mL

## 2020-06-30 MED ORDER — ACETAMINOPHEN 325 MG PO TABS: 325.0000 mg | ORAL_TABLET | ORAL | Status: DC | PRN

## 2020-06-30 MED ORDER — TIMOLOL MALEATE 0.5 % OP SOLN
OPHTHALMIC | Status: DC | PRN
  Administered 2020-06-30: 1 [drp] via OPHTHALMIC

## 2020-06-30 MED ORDER — NA HYALUR & NA CHOND-NA HYALUR 0.4-0.35 ML IO KIT
PACK | INTRAOCULAR | Status: DC | PRN
  Administered 2020-06-30: 1 mL via INTRAOCULAR

## 2020-06-30 MED ORDER — CEFUROXIME OPHTHALMIC INJECTION 1 MG/0.1 ML
INJECTION | OPHTHALMIC | Status: DC | PRN
  Administered 2020-06-30: 0.1 mL via INTRACAMERAL

## 2020-06-30 MED ORDER — EPINEPHRINE PF 1 MG/ML IJ SOLN
INTRAOCULAR | Status: DC | PRN
  Administered 2020-06-30: 108 mL via OPHTHALMIC

## 2020-06-30 MED ORDER — LACTATED RINGERS IV SOLN: INTRAVENOUS | Status: DC

## 2020-06-30 MED ORDER — TETRACAINE HCL 0.5 % OP SOLN
1.0000 [drp] | OPHTHALMIC | Status: DC | PRN
  Administered 2020-06-30 (×3): 1 [drp] via OPHTHALMIC

## 2020-06-30 MED ORDER — ACETAMINOPHEN 160 MG/5ML PO SOLN: 325.0000 mg | ORAL | Status: DC | PRN

## 2020-06-30 MED ORDER — MIDAZOLAM HCL 2 MG/2ML IJ SOLN
INTRAMUSCULAR | Status: DC | PRN
  Administered 2020-06-30: 2 mg via INTRAVENOUS

## 2020-06-30 MED ORDER — FENTANYL CITRATE (PF) 100 MCG/2ML IJ SOLN
INTRAMUSCULAR | Status: DC | PRN
Start: 2020-06-30 — End: 2020-06-30
  Administered 2020-06-30: 50 ug via INTRAVENOUS

## 2020-06-30 MED ORDER — ARMC OPHTHALMIC DILATING DROPS
1.0000 "application " | OPHTHALMIC | Status: DC | PRN
  Administered 2020-06-30 (×3): 1 via OPHTHALMIC

## 2020-06-30 MED ORDER — MOXIFLOXACIN HCL 0.5 % OP SOLN
1.0000 [drp] | OPHTHALMIC | Status: DC | PRN
  Administered 2020-06-30 (×3): 1 [drp] via OPHTHALMIC

## 2020-06-30 SURGICAL SUPPLY — 24 items
CANNULA ANT/CHMB 27G (MISCELLANEOUS) ×1 IMPLANT
CANNULA ANT/CHMB 27GA (MISCELLANEOUS) ×3 IMPLANT
GLOVE SURG LX 7.5 STRW (GLOVE) ×2
GLOVE SURG LX STRL 7.5 STRW (GLOVE) ×1 IMPLANT
GLOVE SURG TRIUMPH 8.0 PF LTX (GLOVE) ×3 IMPLANT
GOWN STRL REUS W/ TWL LRG LVL3 (GOWN DISPOSABLE) ×2 IMPLANT
GOWN STRL REUS W/TWL LRG LVL3 (GOWN DISPOSABLE) ×6
LENS IOL DIOP 17.5 (Intraocular Lens) ×3 IMPLANT
LENS IOL TECNIS MONO 17.5 (Intraocular Lens) IMPLANT
MARKER SKIN DUAL TIP RULER LAB (MISCELLANEOUS) ×3 IMPLANT
NDL CAPSULORHEX 25GA (NEEDLE) ×1 IMPLANT
NDL FILTER BLUNT 18X1 1/2 (NEEDLE) ×2 IMPLANT
NEEDLE CAPSULORHEX 25GA (NEEDLE) ×3 IMPLANT
NEEDLE FILTER BLUNT 18X 1/2SAF (NEEDLE) ×4
NEEDLE FILTER BLUNT 18X1 1/2 (NEEDLE) ×2 IMPLANT
PACK CATARACT BRASINGTON (MISCELLANEOUS) ×3 IMPLANT
PACK EYE AFTER SURG (MISCELLANEOUS) ×3 IMPLANT
PACK OPTHALMIC (MISCELLANEOUS) ×3 IMPLANT
RING MALYGIN (MISCELLANEOUS) ×2 IMPLANT
SOLUTION OPHTHALMIC SALT (MISCELLANEOUS) ×3 IMPLANT
SYR 3ML LL SCALE MARK (SYRINGE) ×6 IMPLANT
SYR TB 1ML LUER SLIP (SYRINGE) ×3 IMPLANT
WATER STERILE IRR 250ML POUR (IV SOLUTION) ×3 IMPLANT
WIPE NON LINTING 3.25X3.25 (MISCELLANEOUS) ×3 IMPLANT

## 2020-06-30 NOTE — Transfer of Care (Signed)
Immediate Anesthesia Transfer of Care Note  Patient: Jose Perry  Procedure(s) Performed: CATARACT EXTRACTION PHACO AND INTRAOCULAR LENS PLACEMENT (IOC) RIGHT, Malyugin 7.19 01:30.8 7.9% (Right Eye)  Patient Location: PACU  Anesthesia Type: MAC  Level of Consciousness: awake, alert  and patient cooperative  Airway and Oxygen Therapy: Patient Spontanous Breathing and Patient connected to supplemental oxygen  Post-op Assessment: Post-op Vital signs reviewed, Patient's Cardiovascular Status Stable, Respiratory Function Stable, Patent Airway and No signs of Nausea or vomiting  Post-op Vital Signs: Reviewed and stable  Complications: No complications documented.

## 2020-06-30 NOTE — H&P (Signed)

## 2020-06-30 NOTE — Anesthesia Procedure Notes (Signed)
Procedure Name: MAC Date/Time: 06/30/2020 10:50 AM Performed by: Silvana Newness, CRNA Pre-anesthesia Checklist: Patient identified, Emergency Drugs available, Suction available, Patient being monitored and Timeout performed Patient Re-evaluated:Patient Re-evaluated prior to induction Oxygen Delivery Method: Nasal cannula Placement Confirmation: positive ETCO2

## 2020-06-30 NOTE — Op Note (Signed)
OPERATIVE NOTE  Jose Perry [id number] 06/30/2020   PREOPERATIVE DIAGNOSIS:    Nuclear Sclerotic Cataract Right eye with miotic pupil.        H25.11  POSTOPERATIVE DIAGNOSIS: Nuclear Sclerotic Cataract Right eye with miotic pupil.          PROCEDURE:  Phacoemusification with posterior chamber intraocular lens placement of the right eye which required pupil stretching with the Malyugin pupil expansion device. Ultrasound time: Procedure(s): CATARACT EXTRACTION PHACO AND INTRAOCULAR LENS PLACEMENT (IOC) RIGHT, Malyugin 7.19 01:30.8 7.9% (Right)  LENS:   Implant Name Type Inv. Item Serial No. Manufacturer Lot No. LRB No. Used Action  LENS IOL DIOP 17.5 - [id number] Intraocular Lens LENS IOL DIOP 17.5 [id number]   Right 1 Implanted        SURGEON:  Wyonia Hough, MD   ANESTHESIA:  Topical with tetracaine drops and 2% Xylocaine jelly, augmented with 1% preservative-free intracameral lidocaine.   COMPLICATIONS:  None.   DESCRIPTION OF PROCEDURE:  The patient was identified in the holding room and transported to the operating room and placed in the supine position under the operating microscope. Theright eye was identified as the operative eye and it was prepped and draped in the usual sterile ophthalmic fashion.   A 1 millimeter clear-corneal paracentesis was made at the 12:00 position.  0.5 ml of preservative-free 1% lidocaine was injected into the anterior chamber. The anterior chamber was filled with Viscoat viscoelastic.  A 2.4 millimeter keratome was used to make a near-clear corneal incision at the 9:00 position. A Malyugin pupil expander was then placed through the main incision and into the anterior chamber of the eye.  The edge of the iris was secured on the lip of the pupil expander and it was released, thereby expanding the pupil to approximately 6.25 millimeters for completion of the cataract surgery.  Additional Viscoat was placed in the anterior chamber.  A  cystotome and capsulorrhexis forceps were used to make a curvilinear capsulorrhexis.   Balanced salt solution was used to hydrodissect and hydrodelineate the lens nucleus.   Phacoemulsification was used in stop and chop fashion to remove the lens, nucleus and epinucleus.  The remaining cortex was aspirated using the irrigation aspiration handpiece.  Additional Provisc was placed into the eye to distend the capsular bag for lens placement.  A lens was then injected into the capsular bag.  The pupil expanding ring was removed using a Kuglen hook and insertion device. The remaining viscoelastic was aspirated from the capsular bag and the anterior chamber.  The anterior chamber was filled with balanced salt solution to inflate to a physiologic pressure.  Wounds were hydrated with balanced salt solution.  The anterior chamber was inflated to a physiologic pressure with balanced salt solution.  No wound leaks were noted.Cefuroxime 0.1 ml of a 10mg /ml solution was injected into the anterior chamber for a dose of 1 mg of intracameral antibiotic at the completion of the case. Timolol and Brimonidine drops were applied to the eye.  The patient was taken to the recovery room in stable condition without complications of anesthesia or surgery.  Jose Perry 06/30/2020, 11:17 AM

## 2020-06-30 NOTE — Anesthesia Postprocedure Evaluation (Signed)
Anesthesia Post Note  Patient: Jose Perry  Procedure(s) Performed: CATARACT EXTRACTION PHACO AND INTRAOCULAR LENS PLACEMENT (IOC) RIGHT, Malyugin 7.19 01:30.8 7.9% (Right Eye)     Patient location during evaluation: PACU Anesthesia Type: MAC Level of consciousness: awake and alert Pain management: pain level controlled Vital Signs Assessment: post-procedure vital signs reviewed and stable Respiratory status: spontaneous breathing, nonlabored ventilation, respiratory function stable and patient connected to nasal cannula oxygen Cardiovascular status: stable and blood pressure returned to baseline Postop Assessment: no apparent nausea or vomiting Anesthetic complications: no   No complications documented.  Trecia Rogers

## 2020-06-30 NOTE — Anesthesia Preprocedure Evaluation (Signed)
Anesthesia Evaluation  Patient identified by MRN, date of birth, ID band Patient awake    Reviewed: Allergy & Precautions, H&P , NPO status , Patient's Chart, lab work & pertinent test results, reviewed documented beta blocker date and time   Airway Mallampati: II  TM Distance: >3 FB Neck ROM: full    Dental no notable dental hx.    Pulmonary asthma ,    Pulmonary exam normal breath sounds clear to auscultation       Cardiovascular Exercise Tolerance: Good hypertension, Normal cardiovascular exam Rhythm:regular Rate:Normal     Neuro/Psych negative neurological ROS  negative psych ROS   GI/Hepatic Neg liver ROS, PUD, GERD  Controlled,  Endo/Other  negative endocrine ROS  Renal/GU negative Renal ROS  negative genitourinary   Musculoskeletal   Abdominal   Peds  Hematology negative hematology ROS (+)   Anesthesia Other Findings   Reproductive/Obstetrics negative OB ROS                             Anesthesia Physical Anesthesia Plan  ASA: II  Anesthesia Plan: MAC   Post-op Pain Management:    Induction:   PONV Risk Score and Plan:   Airway Management Planned:   Additional Equipment:   Intra-op Plan:   Post-operative Plan:   Informed Consent: I have reviewed the patients History and Physical, chart, labs and discussed the procedure including the risks, benefits and alternatives for the proposed anesthesia with the patient or authorized representative who has indicated his/her understanding and acceptance.     Dental Advisory Given  Plan Discussed with: CRNA and Anesthesiologist  Anesthesia Plan Comments:         Anesthesia Quick Evaluation

## 2020-07-05 DIAGNOSIS — Z23 Encounter for immunization: Secondary | ICD-10-CM | POA: Diagnosis not present

## 2020-07-06 ENCOUNTER — Telehealth: Payer: Self-pay | Admitting: Internal Medicine

## 2020-07-06 MED ORDER — BREO ELLIPTA 100-25 MCG/INH IN AEPB: 1.0000 | INHALATION_SPRAY | Freq: Every day | RESPIRATORY_TRACT | 2 refills | Status: DC

## 2020-07-06 MED ORDER — ALBUTEROL SULFATE HFA 108 (90 BASE) MCG/ACT IN AERS: INHALATION_SPRAY | RESPIRATORY_TRACT | 2 refills | Status: DC

## 2020-07-06 NOTE — Telephone Encounter (Signed)
Pt would like a refill on fluticasone furoate-vilanterol (BREO ELLIPTA) 100-25 MCG/INH AEPB. He states that he isnt having any problems but he would like to have it on hand in case of emergency. He also would like a refill on his albuterol.

## 2020-08-27 DIAGNOSIS — Z23 Encounter for immunization: Secondary | ICD-10-CM | POA: Diagnosis not present

## 2020-09-09 DIAGNOSIS — H401112 Primary open-angle glaucoma, right eye, moderate stage: Secondary | ICD-10-CM | POA: Diagnosis not present

## 2020-09-14 ENCOUNTER — Other Ambulatory Visit: Payer: Self-pay | Admitting: Internal Medicine

## 2020-09-23 DIAGNOSIS — E042 Nontoxic multinodular goiter: Secondary | ICD-10-CM | POA: Diagnosis not present

## 2020-09-28 ENCOUNTER — Telehealth: Payer: Self-pay | Admitting: Internal Medicine

## 2020-09-28 ENCOUNTER — Ambulatory Visit: Payer: Medicare Other

## 2020-09-28 NOTE — Telephone Encounter (Signed)
Noted  

## 2020-09-28 NOTE — Telephone Encounter (Signed)
I rescheduled  patient on schedule for 1pm 09-29-20

## 2020-09-29 ENCOUNTER — Ambulatory Visit (INDEPENDENT_AMBULATORY_CARE_PROVIDER_SITE_OTHER): Payer: Medicare Other

## 2020-09-29 VITALS — Ht 65.0 in | Wt 154.0 lb

## 2020-09-29 DIAGNOSIS — Z0000 Encounter for general adult medical examination without abnormal findings: Secondary | ICD-10-CM

## 2020-09-29 NOTE — Progress Notes (Addendum)
Subjective:   Jose Perry is a 79 y.o. male who presents for Medicare Annual/Subsequent preventive examination.  Review of Systems    No ROS.  Medicare Wellness Virtual Visit.   Cardiac Risk Factors include: advanced age (>87men, >69 women);male gender;hypertension     Objective:    Today's Vitals   09/29/20 1304  Weight: 154 lb (69.9 kg)  Height: 5\' 5"  (1.651 m)   Body mass index is 25.63 kg/m.  Advanced Directives 09/29/2020 06/30/2020 09/26/2019 08/23/2018 07/27/2018 07/22/2018 05/14/2018  Does Patient Have a Medical Advance Directive? No Yes No No No No No  Type of Advance Directive - Healthcare Power of [place];Living will - - - - -  Does patient want to make changes to medical advance directive? - No - Patient declined - No - Patient declined - - -  Copy of [place] in Chart? - No - copy requested - - - - -  Would patient like information on creating a medical advance directive? No - Patient declined - No - Patient declined - - No - Patient declined -    Current Medications (verified) Outpatient Encounter Medications as of 09/29/2020  Medication Sig  . albuterol (VENTOLIN HFA) 108 (90 Base) MCG/ACT inhaler TAKE 2 PUFFS BY MOUTH EVERY 6 HOURS AS NEEDED  . amLODipine (NORVASC) 5 MG tablet TAKE 1 TABLET BY MOUTH EVERY DAY  . fluticasone furoate-vilanterol (BREO ELLIPTA) 100-25 MCG/INH AEPB Inhale 1 puff into the lungs daily.  Marland Kitchen latanoprost (XALATAN) 0.005 % ophthalmic solution ADMINISTER 1 DROP TO THE RIGHT EYE NIGHTLY.  Marland Kitchen omeprazole (PRILOSEC) 40 MG capsule TAKE 1 CAPSULE DAILY  . tamsulosin (FLOMAX) 0.4 MG CAPS capsule Take 1 capsule (0.4 mg total) by mouth daily.  . vitamin B-12 (CYANOCOBALAMIN) 1000 MCG tablet Take by mouth.   No facility-administered encounter medications on file as of 09/29/2020.    Allergies (verified) Brimonidine tartrate, Brinzolamide-brimonidine, and Other   History: Past Medical History:  Diagnosis Date  . Asthma    . Dyspnea   . Elevated blood pressure   . Enlarged prostate   . GERD (gastroesophageal reflux disease)   . Glaucoma   . History of adenomatous polyp of colon   . Hx of colonic polyps   . Hypertension   . Leukopenia    has had an extensive w/up.    . Liver hemangioma 01/24/2018  . Thyroid goiter    Past Surgical History:  Procedure Laterality Date  . BACK SURGERY  2002   ruptured disc  . CATARACT EXTRACTION W/PHACO Right 06/30/2020   Procedure: CATARACT EXTRACTION PHACO AND INTRAOCULAR LENS PLACEMENT (IOC) RIGHT, [place] 7.19 01:30.8 7.9%;  Surgeon: Leandrew Koyanagi, MD;  Location: [facility];  Service: Ophthalmology;  Laterality: Right;  . COLONOSCOPY W/ POLYPECTOMY  04/24/2005, 07/06/2010, 07/08/2014  . COLONOSCOPY WITH PROPOFOL N/A 02/01/2018   Procedure: COLONOSCOPY WITH PROPOFOL;  Surgeon: Manya Silvas, MD;  Location[facility] ENDOSCOPY;  Service: Endoscopy;  Laterality: N/A;  . ESOPHAGOGASTRODUODENOSCOPY  04/24/2005, 07/06/2010,  . EYE SURGERY  2009   relieve pressure from glaucoma  . THYROID SURGERY  1968   goiter   Family History  Problem Relation Age of Onset  . Heart disease Mother   . Alcohol abuse Father   . Hyperlipidemia Father   . Stroke Father    Social History   Socioeconomic History  . Marital status: Married    Spouse name: Not on file  . Number of children: Not on file  .  Years of education: Not on file  . Highest education level: Not on file  Occupational History  . Not on file  Tobacco Use  . Smoking status: Never Smoker  . Smokeless tobacco: Never Used  Vaping Use  . Vaping Use: Never used  Substance and Sexual Activity  . Alcohol use: No    Alcohol/week: 0.0 standard drinks  . Drug use: No  . Sexual activity: Yes  Other Topics Concern  . Not on file  Social History Narrative  . Not on file   Social Determinants of Health   Financial Resource Strain: Low Risk   . Difficulty of Paying Living Expenses: Not hard at all  Food  Insecurity: No Food Insecurity  . Worried About Charity fundraiser in the Last Year: Never true  . Ran Out of Food in the Last Year: Never true  Transportation Needs: No Transportation Needs  . Lack of Transportation (Medical): No  . Lack of Transportation (Non-Medical): No  Physical Activity: Sufficiently Active  . Days of Exercise per Week: 7 days  . Minutes of Exercise per Session: 60 min  Stress: No Stress Concern Present  . Feeling of Stress : Not at all  Social Connections: Unknown  . Frequency of Communication with Friends and Family: Not on file  . Frequency of Social Gatherings with Friends and Family: Not on file  . Attends Religious Services: Not on file  . Active Member of Clubs or Organizations: Not on file  . Attends Archivist Meetings: Not on file  . Marital Status: Married    Tobacco Counseling Counseling given: Not Answered   Clinical Intake:  Pre-visit preparation completed: Yes        Diabetes: No  How often do you need to have someone help you when you read instructions, pamphlets, or other written materials from your doctor or pharmacy?: 1 - Never   Interpreter Needed?: No      Activities of Daily Living In your present state of health, do you have any difficulty performing the following activities: 09/29/2020 06/30/2020  Hearing? N N  Vision? N N  Difficulty concentrating or making decisions? N N  Walking or climbing stairs? N N  Dressing or bathing? N N  Doing errands, shopping? N -  Preparing Food and eating ? N -  Using the Toilet? N -  In the past six months, have you accidently leaked urine? N -  Do you have problems with loss of bowel control? N -  Managing your Medications? N -  Managing your Finances? N -  Housekeeping or managing your Housekeeping? N -  Some recent data might be hidden    Patient Care Team: Einar Pheasant, MD as PCP - General (Internal Medicine)  Indicate any recent Medical Services you may have  received from other than [facility] providers in the past year (date may be approximate).     Assessment:   This is a routine wellness examination for Jose Perry.  I connected with Jose Perry today by telephone and verified that I am speaking with the correct person using two identifiers. Location patient: home Location provider: work Persons participating in the virtual visit: patient, Marine scientist.    I discussed the limitations, risks, security and privacy concerns of performing an evaluation and management service by telephone and the availability of in person appointments. The patient expressed understanding and verbally consented to this telephonic visit.    Interactive audio and video telecommunications were attempted between this provider and patient,  however failed, due to patient having technical difficulties OR patient did not have access to video capability.  We continued and completed visit with audio only.  Some vital signs may be absent or patient reported.   Hearing/Vision screen  Hearing Screening   125Hz  250Hz  500Hz  1000Hz  2000Hz  3000Hz  4000Hz  6000Hz  8000Hz   Right ear:           Left ear:           Comments: Patient is able to hear conversational tones without difficulty.  No issues reported.  Vision Screening Comments: Follow[facility] Wears corrective lenses Cataract extraction, R eye Glaucoma; drops in use R eye Visual acuity not assessed, virtual visit.  They have seen their ophthalmologist every 3 months.     Dietary issues and exercise activities discussed: Current Exercise Habits: Home exercise routine, Intensity: Mild  Regular diet Good water iintake  Goals    . Maintain Healthy Lifestyle     Stay active Healthy diet        Depression Screen PHQ 2/9 Scores 09/29/2020 09/26/2019 08/23/2018 08/20/2017 06/21/2017 08/18/2016 07/04/2016  PHQ - 2 Score 0 0 0 0 0 0 0  PHQ- 9 Score - - - 0 - - -    Fall Risk Fall Risk  09/29/2020 09/26/2019 08/26/2018  08/23/2018 08/20/2017  Falls in the past year? 0 0 0 0 No  Comment - - Emmi Telephone Survey: data to providers prior to load - -  Number falls in past yr: 0 - - - -  Injury with Fall? 0 - - - -  Follow up Falls evaluation completed Falls prevention discussed - - -    FALL RISK PREVENTION PERTAINING TO THE HOME: Handrails in use when climbing stairs? Yes Home free of loose throw rugs in walkways, pet beds, electrical cords, etc? Yes  Adequate lighting in your home to reduce risk of falls? Yes   ASSISTIVE DEVICES UTILIZED TO PREVENT FALLS: Use of a cane, walker or w/c? No   TIMED UP AND GO: Was the test performed? No . Virtual visit.   Cognitive Function: MMSE - Mini Mental State Exam 08/23/2018 08/20/2017 08/18/2016  Orientation to time 5 5 5   Orientation to Place 5 5 5   Registration 3 3 3   Attention/ Calculation 5 5 5   Recall 3 3 3   Language- name 2 objects 2 2 2   Language- repeat 1 1 1   Language- follow 3 step command 3 3 3   Language- read & follow direction 1 1 1   Write a sentence 1 1 1   Copy design 1 1 1   Total score 30 30 30      6CIT Screen 09/29/2020 09/26/2019 08/18/2016  What Year? 0 points 0 points 0 points  What month? 0 points 0 points 0 points  What time? 0 points 0 points 0 points  Count back from 20 0 points 0 points 0 points  Months in reverse 0 points 0 points 0 points  Repeat phrase 0 points 0 points -  Total Score 0 0 -    Immunizations Immunization History  Administered Date(s) Administered  . Fluad Quad(high Dose 65+) 08/27/2020  . Influenza, High Dose Seasonal PF 06/13/2016, 08/20/2017, 07/31/2018, 08/22/2019  . Influenza,inj,Quad PF,6+ Mos 07/20/2015  . PFIZER SARS-COV-2 Vaccination 10/30/2019, 11/20/2019, 07/05/2020  . Pneumococcal Conjugate-13 06/10/2015  . Pneumococcal Polysaccharide-23 06/30/2013  . Zoster 06/30/2013  . Zoster Recombinat (Shingrix) 07/06/2018    TDAP status: Due, Education has been provided regarding the importance  of  this vaccine. Advised may receive this vaccine at local pharmacy or Health Dept. Aware to provide a copy of the vaccination record if obtained from local pharmacy or Health Dept. Verbalized acceptance and understanding.  Deferred.  Health Maintenance Health Maintenance  Topic Date Due  . TETANUS/TDAP  Never done  . INFLUENZA VACCINE  Completed  . COVID-19 Vaccine  Completed  . Hepatitis C Screening  Completed  . PNA vac Low Risk Adult  Completed   Colonoscopy- recommended f/u 2020.  Lung Cancer Screening: (Low Dose CT Chest recommended if Age 29-80 years, 30 pack-year currently smoking OR have quit w/in 15years.) does not qualify.   Hepatitis C Screening: Completed 05/06/18.  Vision Screening: Recommended annual ophthalmology exams for early detection of glaucoma and other disorders of the eye. Is the patient up to date with their annual eye exam?  Yes  Who is the provider or what is the name of the office in which the patient attends annual eye exams? Visits every 3 months. Cataract extracted R eye.   Dental Screening: Recommended annual dental exams for proper oral hygiene. Visits every 3 months.   Community Resource Referral / Chronic Care Management: CRR required this visit?  No   CCM required this visit?  No      Plan:   Keep all routine maintenance appointments.   Follow up 11/10/19 @ 8:00  I have personally reviewed and noted the following in the patient's chart:   . Medical and social history . Use of alcohol, tobacco or illicit drugs  . Current medications and supplements . Functional ability and status . Nutritional status . Physical activity . Advanced directives . List of other physicians . Hospitalizations, surgeries, and ER visits in previous 12 months . Vitals . Screenings to include cognitive, depression, and falls . Referrals and appointments  In addition, I have reviewed and discussed with patient certain preventive protocols, quality metrics, and  best practice recommendations. A written personalized care plan for preventive services as well as general preventive health recommendations were provided to patient via mychart.     Varney Biles, LPN   [id number]    Agree with plan. Mable Paris, NP

## 2020-09-29 NOTE — Patient Instructions (Addendum)
[name] , Thank you for taking time to come for your Medicare Wellness Visit. I appreciate your ongoing commitment to your health goals. Please review the following plan we discussed and let me know if I can assist you in the future.   These are the goals we discussed: Goals    . Maintain Healthy Lifestyle     Stay active Healthy diet         This is a list of the screening recommended for you and due dates:  Health Maintenance  Topic Date Due  . Tetanus Vaccine  Never done  . Flu Shot  Completed  . COVID-19 Vaccine  Completed  .  Hepatitis C: One time screening is recommended by Center for Disease Control  (CDC) for  adults born from [birth year] through 1964.   Completed  . Pneumonia vaccines  Completed    Immunizations Immunization History  Administered Date(s) Administered  . Fluad Quad(high Dose 65+) 08/27/2020  . Influenza, High Dose Seasonal PF 06/13/2016, 08/20/2017, 07/31/2018, 08/22/2019  . Influenza,inj,Quad PF,6+ Mos 07/20/2015  . PFIZER SARS-COV-2 Vaccination 10/30/2019, 11/20/2019, 07/05/2020  . Pneumococcal Conjugate-13 06/10/2015  . Pneumococcal Polysaccharide-23 06/30/2013  . Zoster 06/30/2013  . Zoster Recombinat (Shingrix) 07/06/2018   Advanced directives: not yet completed  Conditions/risks identified: none  new  Follow up in one year for your annual wellness visit.  Preventive Care 12 Years and Older, Male Preventive care refers to lifestyle choices and visits with your health care provider that can promote health and wellness. What does preventive care include?  A yearly physical exam. This is also called an annual well check.  Dental exams once or twice a year.  Routine eye exams. Ask your health care provider how often you should have your eyes checked.  Personal lifestyle choices, including:  Daily care of your teeth and gums.  Regular physical activity.  Eating a healthy diet.  Avoiding tobacco and drug use.  Limiting alcohol  use.  Practicing safe sex.  Taking low doses of aspirin every day.  Taking vitamin and mineral supplements as recommended by your health care provider. What happens during an annual well check? The services and screenings done by your health care provider during your annual well check will depend on your age, overall health, lifestyle risk factors, and family history of disease. Counseling  Your health care provider may ask you questions about your:  Alcohol use.  Tobacco use.  Drug use.  Emotional well-being.  Home and relationship well-being.  Sexual activity.  Eating habits.  History of falls.  Memory and ability to understand (cognition).  Work and work Statistician. Screening  You may have the following tests or measurements:  Height, weight, and BMI.  Blood pressure.  Lipid and cholesterol levels. These may be checked every 5 years, or more frequently if you are over 81 years old.  Skin check.  Lung cancer screening. You may have this screening every year starting at age 44 if you have a 30-pack-year history of smoking and currently smoke or have quit within the past 15 years.  Fecal occult blood test (FOBT) of the stool. You may have this test every year starting at age 81.  Flexible sigmoidoscopy or colonoscopy. You may have a sigmoidoscopy every 5 years or a colonoscopy every 10 years starting at age 21.  Prostate cancer screening. Recommendations will vary depending on your family history and other risks.  Hepatitis C blood test.  Hepatitis B blood test.  Sexually transmitted disease (STD)  testing.  Diabetes screening. This is done by checking your blood sugar (glucose) after you have not eaten for a while (fasting). You may have this done every 1-3 years.  Abdominal aortic aneurysm (AAA) screening. You may need this if you are a current or former smoker.  Osteoporosis. You may be screened starting at age 12 if you are at high risk. Talk with your  health care provider about your test results, treatment options, and if necessary, the need for more tests. Vaccines  Your health care provider may recommend certain vaccines, such as:  Influenza vaccine. This is recommended every year.  Tetanus, diphtheria, and acellular pertussis (Tdap, Td) vaccine. You may need a Td booster every 10 years.  Zoster vaccine. You may need this after age 42.  Pneumococcal 13-valent conjugate (PCV13) vaccine. One dose is recommended after age 4.  Pneumococcal polysaccharide (PPSV23) vaccine. One dose is recommended after age 79. Talk to your health care provider about which screenings and vaccines you need and how often you need them. This information is not intended to replace advice given to you by your health care provider. Make sure you discuss any questions you have with your health care provider. Document Released: 10/22/2015 Document Revised: 06/14/2016 Document Reviewed: 07/27/2015 Elsevier Interactive Patient Education  2017 [employer].  Fall Prevention in the Home Falls can cause injuries. They can happen to people of all ages. There are many things you can do to make your home safe and to help prevent falls. What can I do on the outside of my home?  Regularly fix the edges of walkways and driveways and fix any cracks.  Remove anything that might make you trip as you walk through a door, such as a raised step or threshold.  Trim any bushes or trees on the path to your home.  Use bright outdoor lighting.  Clear any walking paths of anything that might make someone trip, such as rocks or tools.  Regularly check to see if handrails are loose or broken. Make sure that both sides of any steps have handrails.  Any raised decks and porches should have guardrails on the edges.  Have any leaves, snow, or ice cleared regularly.  Use sand or salt on walking paths during winter.  Clean up any spills in your garage right away. This includes oil  or grease spills. What can I do in the bathroom?  Use night lights.  Install grab bars by the toilet and in the tub and shower. Do not use towel bars as grab bars.  Use non-skid mats or decals in the tub or shower.  If you need to sit down in the shower, use a plastic, non-slip stool.  Keep the floor dry. Clean up any water that spills on the floor as soon as it happens.  Remove soap buildup in the tub or shower regularly.  Attach bath mats securely with double-sided non-slip rug tape.  Do not have throw rugs and other things on the floor that can make you trip. What can I do in the bedroom?  Use night lights.  Make sure that you have a light by your bed that is easy to reach.  Do not use any sheets or blankets that are too big for your bed. They should not hang down onto the floor.  Have a firm chair that has side arms. You can use this for support while you get dressed.  Do not have throw rugs and other things on the floor  that can make you trip. What can I do in the kitchen?  Clean up any spills right away.  Avoid walking on wet floors.  Keep items that you use a lot in easy-to-reach places.  If you need to reach something above you, use a strong step stool that has a grab bar.  Keep electrical cords out of the way.  Do not use floor polish or wax that makes floors slippery. If you must use wax, use non-skid floor wax.  Do not have throw rugs and other things on the floor that can make you trip. What can I do with my stairs?  Do not leave any items on the stairs.  Make sure that there are handrails on both sides of the stairs and use them. Fix handrails that are broken or loose. Make sure that handrails are as long as the stairways.  Check any carpeting to make sure that it is firmly attached to the stairs. Fix any carpet that is loose or worn.  Avoid having throw rugs at the top or bottom of the stairs. If you do have throw rugs, attach them to the floor with  carpet tape.  Make sure that you have a light switch at the top of the stairs and the bottom of the stairs. If you do not have them, ask someone to add them for you. What else can I do to help prevent falls?  Wear shoes that:  Do not have high heels.  Have rubber bottoms.  Are comfortable and fit you well.  Are closed at the toe. Do not wear sandals.  If you use a stepladder:  Make sure that it is fully opened. Do not climb a closed stepladder.  Make sure that both sides of the stepladder are locked into place.  Ask someone to hold it for you, if possible.  Clearly mark and make sure that you can see:  Any grab bars or handrails.  First and last steps.  Where the edge of each step is.  Use tools that help you move around (mobility aids) if they are needed. These include:  Canes.  Walkers.  Scooters.  Crutches.  Turn on the lights when you go into a dark area. Replace any light bulbs as soon as they burn out.  Set up your furniture so you have a clear path. Avoid moving your furniture around.  If any of your floors are uneven, fix them.  If there are any pets around you, be aware of where they are.  Review your medicines with your doctor. Some medicines can make you feel dizzy. This can increase your chance of falling. Ask your doctor what other things that you can do to help prevent falls. This information is not intended to replace advice given to you by your health care provider. Make sure you discuss any questions you have with your health care provider. Document Released: 07/22/2009 Document Revised: 03/02/2016 Document Reviewed: 10/30/2014 Elsevier Interactive Patient Education  2017 [employer].

## 2020-11-02 ENCOUNTER — Encounter: Payer: Medicare Other | Admitting: Internal Medicine

## 2020-11-09 ENCOUNTER — Other Ambulatory Visit: Payer: Self-pay

## 2020-11-09 ENCOUNTER — Encounter: Payer: Self-pay | Admitting: Internal Medicine

## 2020-11-09 ENCOUNTER — Ambulatory Visit (INDEPENDENT_AMBULATORY_CARE_PROVIDER_SITE_OTHER): Payer: Medicare Other | Admitting: Internal Medicine

## 2020-11-09 VITALS — BP 120/70 | HR 80 | Temp 97.4°F | Resp 16 | Ht 65.0 in | Wt 161.0 lb

## 2020-11-09 DIAGNOSIS — H409 Unspecified glaucoma: Secondary | ICD-10-CM | POA: Diagnosis not present

## 2020-11-09 DIAGNOSIS — E611 Iron deficiency: Secondary | ICD-10-CM | POA: Diagnosis not present

## 2020-11-09 DIAGNOSIS — R972 Elevated prostate specific antigen [PSA]: Secondary | ICD-10-CM

## 2020-11-09 DIAGNOSIS — I10 Essential (primary) hypertension: Secondary | ICD-10-CM

## 2020-11-09 DIAGNOSIS — N401 Enlarged prostate with lower urinary tract symptoms: Secondary | ICD-10-CM

## 2020-11-09 DIAGNOSIS — K219 Gastro-esophageal reflux disease without esophagitis: Secondary | ICD-10-CM

## 2020-11-09 DIAGNOSIS — Z125 Encounter for screening for malignant neoplasm of prostate: Secondary | ICD-10-CM

## 2020-11-09 DIAGNOSIS — Z13220 Encounter for screening for lipoid disorders: Secondary | ICD-10-CM

## 2020-11-09 DIAGNOSIS — Z0000 Encounter for general adult medical examination without abnormal findings: Secondary | ICD-10-CM | POA: Diagnosis not present

## 2020-11-09 DIAGNOSIS — D72819 Decreased white blood cell count, unspecified: Secondary | ICD-10-CM

## 2020-11-09 DIAGNOSIS — D696 Thrombocytopenia, unspecified: Secondary | ICD-10-CM | POA: Diagnosis not present

## 2020-11-09 DIAGNOSIS — E041 Nontoxic single thyroid nodule: Secondary | ICD-10-CM | POA: Diagnosis not present

## 2020-11-09 DIAGNOSIS — J4520 Mild intermittent asthma, uncomplicated: Secondary | ICD-10-CM

## 2020-11-09 LAB — BASIC METABOLIC PANEL
BUN: 11 mg/dL (ref 6–23)
CO2: 31 mEq/L (ref 19–32)
Calcium: 9.9 mg/dL (ref 8.4–10.5)
Chloride: 103 mEq/L (ref 96–112)
Creatinine, Ser: 1.33 mg/dL (ref 0.40–1.50)
GFR: 51.1 mL/min — ABNORMAL LOW (ref >60.00)
Glucose, Bld: 91 mg/dL (ref 70–99)
Potassium: 4.5 mEq/L (ref 3.5–5.1)
Sodium: 137 mEq/L (ref 135–145)

## 2020-11-09 LAB — CBC WITH DIFFERENTIAL/PLATELET
Basophils Absolute: 0 10*3/uL (ref 0.0–0.1)
Basophils Relative: 2.1 % (ref 0.0–3.0)
Eosinophils Absolute: 0.1 10*3/uL (ref 0.0–0.7)
Eosinophils Relative: 5.4 % — ABNORMAL HIGH (ref 0.0–5.0)
HCT: 40.2 % (ref 39.0–52.0)
Hemoglobin: 13 g/dL (ref 13.0–17.0)
Lymphocytes Relative: 34.7 % (ref 12.0–46.0)
Lymphs Abs: 0.8 10*3/uL (ref 0.7–4.0)
MCHC: 32.3 g/dL (ref 30.0–36.0)
MCV: 82.5 fl (ref 78.0–100.0)
Monocytes Absolute: 0.2 10*3/uL (ref 0.1–1.0)
Monocytes Relative: 10.4 % (ref 3.0–12.0)
Neutro Abs: 1.1 10*3/uL — ABNORMAL LOW (ref 1.4–7.7)
Neutrophils Relative %: 47.4 % (ref 43.0–77.0)
Platelets: 141 10*3/uL — ABNORMAL LOW (ref 150.0–400.0)
RBC: 4.88 Mil/uL (ref 4.22–5.81)
RDW: 15.2 % (ref 11.5–15.5)
WBC: 2.3 10*3/uL — ABNORMAL LOW (ref 4.0–10.5)

## 2020-11-09 LAB — HEPATIC FUNCTION PANEL
ALT: 16 U/L (ref 0–53)
AST: 26 U/L (ref 0–37)
Albumin: 4.3 g/dL (ref 3.5–5.2)
Alkaline Phosphatase: 90 U/L (ref 39–117)
Bilirubin, Direct: 0.1 mg/dL (ref 0.0–0.3)
Total Bilirubin: 0.4 mg/dL (ref 0.2–1.2)
Total Protein: 6.8 g/dL (ref 6.0–8.3)

## 2020-11-09 LAB — LIPID PANEL
Cholesterol: 159 mg/dL (ref 0–200)
HDL: 67.8 mg/dL (ref >39.00)
LDL Cholesterol: 82 mg/dL (ref 0–99)
NonHDL: 91.58
Total CHOL/HDL Ratio: 2
Triglycerides: 48 mg/dL (ref 0.0–149.0)
VLDL: 9.6 mg/dL (ref 0.0–40.0)

## 2020-11-09 LAB — T4, FREE: Free T4: 0.69 ng/dL (ref 0.60–1.60)

## 2020-11-09 LAB — TSH: TSH: 2.13 u[IU]/mL (ref 0.35–4.50)

## 2020-11-09 LAB — FERRITIN: Ferritin: 22.2 ng/mL (ref 22.0–322.0)

## 2020-11-09 LAB — PSA, MEDICARE: PSA: 3.53 ng/ml (ref 0.10–4.00)

## 2020-11-09 MED ORDER — OMEPRAZOLE 40 MG PO CPDR
40.0000 mg | DELAYED_RELEASE_CAPSULE | Freq: Every day | ORAL | 1 refills | Status: DC
Start: 2020-11-09 — End: 2021-02-28

## 2020-11-09 NOTE — Assessment & Plan Note (Signed)
Evaluated by endocrinology 09/23/20.  Stable.  Recommended f/u in one year with ultrasound prior.

## 2020-11-09 NOTE — Assessment & Plan Note (Signed)
Controlled on prilosec.   

## 2020-11-09 NOTE — Assessment & Plan Note (Signed)
Has been previously evaluated by hematology.  Recheck cbc today.

## 2020-11-09 NOTE — Progress Notes (Signed)
Patient ID: Jose Perry, male   DOB: Jul 25, 1941, 80 y.o.   MRN: [id number]   Subjective:    Patient ID: Jose Perry, male    DOB: 1940-11-16, 80 y.o.   MRN: [id number]  HPI This visit occurred during the SARS-CoV-2 public health emergency.  Safety protocols were in place, including screening questions prior to the visit, additional usage of staff PPE, and extensive cleaning of exam room while observing appropriate contact time as indicated for disinfecting solutions.  Patient here for his physical exam. Doing well.  Stays active.  No chest pain or sob with increased activity or exertion.  No acid reflux.  Controlled.  On prilosec.  No abdominal pain. Bowels moving.  No urine change.  Saw Dr Honor Junes.  F/u thyroid.  Stable.  Recommended f/u in one year with ultrasound prior.  Discussed tetanus and shingles vaccine.    Past Medical History:  Diagnosis Date  . Asthma   . Dyspnea   . Elevated blood pressure   . Enlarged prostate   . GERD (gastroesophageal reflux disease)   . Glaucoma   . History of adenomatous polyp of colon   . Hx of colonic polyps   . Hypertension   . Leukopenia    has had an extensive w/up.    . Liver hemangioma 01/24/2018  . Thyroid goiter    Past Surgical History:  Procedure Laterality Date  . BACK SURGERY  2002   ruptured disc  . CATARACT EXTRACTION W/PHACO Right 06/30/2020   Procedure: CATARACT EXTRACTION PHACO AND INTRAOCULAR LENS PLACEMENT (IOC) RIGHT, Malyugin 7.19 01:30.8 7.9%;  Surgeon: Leandrew Koyanagi, MD;  Location: [place];  Service: Ophthalmology;  Laterality: Right;  . COLONOSCOPY W/ POLYPECTOMY  04/24/2005, 07/06/2010, 07/08/2014  . COLONOSCOPY WITH PROPOFOL N/A 02/01/2018   Procedure: COLONOSCOPY WITH PROPOFOL;  Surgeon: Manya Silvas, MD;  Location: [facility] ENDOSCOPY;  Service: Endoscopy;  Laterality: N/A;  . ESOPHAGOGASTRODUODENOSCOPY  04/24/2005, 07/06/2010,  . EYE SURGERY  2009   relieve pressure from glaucoma  . THYROID  SURGERY  1968   goiter   Family History  Problem Relation Age of Onset  . Heart disease Mother   . Alcohol abuse Father   . Hyperlipidemia Father   . Stroke Father    Social History   Socioeconomic History  . Marital status: Married    Spouse name: Not on file  . Number of children: Not on file  . Years of education: Not on file  . Highest education level: Not on file  Occupational History  . Not on file  Tobacco Use  . Smoking status: Never Smoker  . Smokeless tobacco: Never Used  Vaping Use  . Vaping Use: Never used  Substance and Sexual Activity  . Alcohol use: No    Alcohol/week: 0.0 standard drinks  . Drug use: No  . Sexual activity: Yes  Other Topics Concern  . Not on file  Social History Narrative  . Not on file   Social Determinants of Health   Financial Resource Strain: Low Risk   . Difficulty of Paying Living Expenses: Not hard at all  Food Insecurity: No Food Insecurity  . Worried About Charity fundraiser in the Last Year: Never true  . Ran Out of Food in the Last Year: Never true  Transportation Needs: No Transportation Needs  . Lack of Transportation (Medical): No  . Lack of Transportation (Non-Medical): No  Physical Activity: Sufficiently Active  . Days of Exercise per Week: 7  days  . Minutes of Exercise per Session: 60 min  Stress: No Stress Concern Present  . Feeling of Stress : Not at all  Social Connections: Unknown  . Frequency of Communication with Friends and Family: Not on file  . Frequency of Social Gatherings with Friends and Family: Not on file  . Attends Religious Services: Not on file  . Active Member of Clubs or Organizations: Not on file  . Attends Archivist Meetings: Not on file  . Marital Status: Married    Outpatient Encounter Medications as of 11/09/2020  Medication Sig  . albuterol (VENTOLIN HFA) 108 (90 Base) MCG/ACT inhaler TAKE 2 PUFFS BY MOUTH EVERY 6 HOURS AS NEEDED  . amLODipine (NORVASC) 5 MG tablet TAKE  1 TABLET BY MOUTH EVERY DAY  . fluticasone furoate-vilanterol (BREO ELLIPTA) 100-25 MCG/INH AEPB Inhale 1 puff into the lungs daily.  Marland Kitchen latanoprost (XALATAN) 0.005 % ophthalmic solution ADMINISTER 1 DROP TO THE RIGHT EYE NIGHTLY.  Marland Kitchen omeprazole (PRILOSEC) 40 MG capsule Take 1 capsule (40 mg total) by mouth daily.  . tamsulosin (FLOMAX) 0.4 MG CAPS capsule Take 1 capsule (0.4 mg total) by mouth daily.  . vitamin B-12 (CYANOCOBALAMIN) 1000 MCG tablet Take by mouth.  . [DISCONTINUED] omeprazole (PRILOSEC) 40 MG capsule TAKE 1 CAPSULE DAILY   No facility-administered encounter medications on file as of 11/09/2020.    Review of Systems  Constitutional: Negative for appetite change and unexpected weight change.  HENT: Negative for congestion, sinus pressure and sore throat.   Eyes: Negative for pain and visual disturbance.  Respiratory: Negative for cough, chest tightness and shortness of breath.   Cardiovascular: Negative for chest pain, palpitations and leg swelling.  Gastrointestinal: Negative for abdominal pain, diarrhea, nausea and vomiting.       Acid reflux controlled on prilosec.   Genitourinary: Negative for difficulty urinating and dysuria.  Musculoskeletal: Negative for joint swelling and myalgias.  Skin: Negative for color change and rash.  Neurological: Negative for dizziness, light-headedness and headaches.  Hematological: Negative for adenopathy. Does not bruise/bleed easily.  Psychiatric/Behavioral: Negative for agitation and dysphoric mood.       Objective:    Physical Exam Vitals reviewed.  Constitutional:      General: He is not in acute distress.    Appearance: Normal appearance. He is well-developed and well-nourished.  HENT:     Head: Normocephalic and atraumatic.     Right Ear: External ear normal.     Left Ear: External ear normal.     Mouth/Throat:     Mouth: Oropharynx is clear and moist.     Pharynx: No oropharyngeal exudate.  Eyes:     General: No scleral  icterus.       Right eye: No discharge.        Left eye: No discharge.     Conjunctiva/sclera: Conjunctivae normal.  Neck:     Thyroid: No thyromegaly.  Cardiovascular:     Rate and Rhythm: Normal rate and regular rhythm.  Pulmonary:     Effort: No respiratory distress.     Breath sounds: Normal breath sounds. No wheezing.  Abdominal:     General: Bowel sounds are normal.     Palpations: Abdomen is soft.     Tenderness: There is no abdominal tenderness.  Musculoskeletal:        General: No swelling, tenderness or edema.     Cervical back: Neck supple. No tenderness.  Lymphadenopathy:     Cervical: No cervical adenopathy.  Skin:    Findings: No erythema or rash.  Neurological:     Mental Status: He is alert and oriented to person, place, and time.  Psychiatric:        Mood and Affect: Mood and affect and mood normal.        Behavior: Behavior normal.     BP 120/70   Pulse 80   Temp (!) 97.4 F (36.3 C) (Oral)   Resp 16   Ht 5\' 5"  (1.651 m)   Wt 161 lb (73 kg)   SpO2 98%   BMI 26.79 kg/m  Wt Readings from Last 3 Encounters:  11/09/20 161 lb (73 kg)  09/29/20 154 lb (69.9 kg)  06/30/20 154 lb (69.9 kg)     Lab Results  Component Value Date   WBC 2.3 aL (L) 11/09/2020   HGB 13.0 11/09/2020   HCT 40.2 11/09/2020   PLT 141.0 (L) 11/09/2020   GLUCOSE 91 11/09/2020   CHOL 159 11/09/2020   TRIG 48.0 11/09/2020   HDL 67.80 11/09/2020   LDLCALC 82 11/09/2020   ALT 16 11/09/2020   AST 26 11/09/2020   NA 137 11/09/2020   K 4.5 11/09/2020   CL 103 11/09/2020   CREATININE 1.33 11/09/2020   BUN 11 11/09/2020   CO2 31 11/09/2020   TSH 2.13 11/09/2020   PSA 3.53 11/09/2020       Assessment & Plan:   Problem List Items Addressed This Visit    Asthma    Using breo.  Breathing stable.  Follow.       Elevated prostate specific antigen (PSA)    Followed by urology.  Recheck psa today.       GERD (gastroesophageal reflux disease)    Controlled on prilosec.        Relevant Medications   omeprazole (PRILOSEC) 40 MG capsule   Glaucoma    Followed by ophthalmology.        Health care maintenance    Physical today 11/09/20.  Colonoscopy 01/2018.  Dr Bernardo Heater follows prostate.  Request psa to be checked today.       Hyperplasia of prostate with lower urinary tract symptoms (LUTS)    On flomax.  Symptoms stable.  Followed by urology.       Hypertension    Continue amlodipine.  Blood pressure as outlined.  Follow metabolic panel.       Relevant Orders   Basic metabolic panel (Completed)   Iron deficiency    Saw GI.  2019 - colonoscopy.  Previous hemoccult cards negative.  Elected to follow.  See GI note.  Recheck iron studies.       Relevant Orders   Ferritin (Completed)   Leukopenia    Has been previously evaluated by hematology.  Recheck cbc today.       Relevant Orders   CBC with Differential/Platelet (Completed)   Thrombocytopenia (HCC)    Recheck cbc today.  Has been stable.       Relevant Orders   Hepatic function panel (Completed)   Thyroid nodule    Evaluated by endocrinology 09/23/20.  Stable.  Recommended f/u in one year with ultrasound prior.        Relevant Orders   T4, free (Completed)   TSH (Completed)    Other Visit Diagnoses    Routine general medical examination at a health care facility    -  Primary   Screening cholesterol level       Relevant Orders   Lipid  panel (Completed)   Prostate cancer screening       Relevant Orders   PSA, Medicare (Completed)       Einar Pheasant, MD

## 2020-11-09 NOTE — Assessment & Plan Note (Signed)
On flomax.  Symptoms stable.  Followed by urology.

## 2020-11-09 NOTE — Assessment & Plan Note (Signed)
Followed by urology.  Recheck psa today.

## 2020-11-09 NOTE — Assessment & Plan Note (Signed)
Physical today 11/09/20.  Colonoscopy 01/2018.  Dr Bernardo Heater follows prostate.  Request psa to be checked today.

## 2020-11-09 NOTE — Assessment & Plan Note (Signed)
Continue amlodipine.  Blood pressure as outlined.  Follow metabolic panel.

## 2020-11-09 NOTE — Assessment & Plan Note (Signed)
Recheck cbc today.  Has been stable.

## 2020-11-09 NOTE — Assessment & Plan Note (Signed)
Using breo.  Breathing stable.  Follow.

## 2020-11-09 NOTE — Assessment & Plan Note (Signed)
Followed by ophthalmology.   

## 2020-11-09 NOTE — Assessment & Plan Note (Signed)
Saw GI.  2019 - colonoscopy.  Previous hemoccult cards negative.  Elected to follow.  See GI note.  Recheck iron studies.

## 2020-11-29 DIAGNOSIS — H6123 Impacted cerumen, bilateral: Secondary | ICD-10-CM | POA: Diagnosis not present

## 2020-11-29 DIAGNOSIS — K121 Other forms of stomatitis: Secondary | ICD-10-CM | POA: Diagnosis not present

## 2020-12-10 DIAGNOSIS — H401123 Primary open-angle glaucoma, left eye, severe stage: Secondary | ICD-10-CM | POA: Diagnosis not present

## 2021-02-26 ENCOUNTER — Other Ambulatory Visit: Payer: Self-pay | Admitting: Internal Medicine

## 2021-04-22 DIAGNOSIS — H00011 Hordeolum externum right upper eyelid: Secondary | ICD-10-CM | POA: Diagnosis not present

## 2021-05-09 ENCOUNTER — Ambulatory Visit: Payer: Medicare Other | Admitting: Internal Medicine

## 2021-05-23 ENCOUNTER — Other Ambulatory Visit: Payer: Self-pay | Admitting: Urology

## 2021-05-23 DIAGNOSIS — N138 Other obstructive and reflux uropathy: Secondary | ICD-10-CM

## 2021-05-30 DIAGNOSIS — K121 Other forms of stomatitis: Secondary | ICD-10-CM | POA: Diagnosis not present

## 2021-05-30 DIAGNOSIS — H6123 Impacted cerumen, bilateral: Secondary | ICD-10-CM | POA: Diagnosis not present

## 2021-06-02 ENCOUNTER — Ambulatory Visit: Payer: Medicare Other | Admitting: Internal Medicine

## 2021-06-04 ENCOUNTER — Other Ambulatory Visit: Payer: Self-pay | Admitting: Internal Medicine

## 2021-06-09 DIAGNOSIS — H401123 Primary open-angle glaucoma, left eye, severe stage: Secondary | ICD-10-CM | POA: Diagnosis not present

## 2021-06-09 DIAGNOSIS — H401112 Primary open-angle glaucoma, right eye, moderate stage: Secondary | ICD-10-CM | POA: Diagnosis not present

## 2021-06-16 ENCOUNTER — Encounter: Payer: Self-pay | Admitting: Hematology and Oncology

## 2021-06-22 ENCOUNTER — Telehealth: Payer: Self-pay | Admitting: Internal Medicine

## 2021-06-22 DIAGNOSIS — I10 Essential (primary) hypertension: Secondary | ICD-10-CM

## 2021-06-22 DIAGNOSIS — Z13220 Encounter for screening for lipoid disorders: Secondary | ICD-10-CM

## 2021-06-22 NOTE — Telephone Encounter (Signed)
Patient is calling in to see if he can have labs before his 10/3 appointment,please add orders for the patient.

## 2021-06-23 ENCOUNTER — Ambulatory Visit: Payer: Self-pay | Admitting: Urology

## 2021-06-23 NOTE — Telephone Encounter (Signed)
Labs ordered and patient has been scheduled

## 2021-06-24 ENCOUNTER — Encounter: Payer: Self-pay | Admitting: Urology

## 2021-06-26 ENCOUNTER — Other Ambulatory Visit: Payer: Self-pay | Admitting: Internal Medicine

## 2021-07-07 ENCOUNTER — Other Ambulatory Visit: Payer: Self-pay

## 2021-07-07 ENCOUNTER — Other Ambulatory Visit (INDEPENDENT_AMBULATORY_CARE_PROVIDER_SITE_OTHER): Payer: Medicare Other

## 2021-07-07 DIAGNOSIS — Z13220 Encounter for screening for lipoid disorders: Secondary | ICD-10-CM

## 2021-07-07 DIAGNOSIS — I10 Essential (primary) hypertension: Secondary | ICD-10-CM

## 2021-07-07 LAB — LIPID PANEL
Cholesterol: 164 mg/dL (ref 0–200)
HDL: 73.3 mg/dL (ref >39.00)
LDL Cholesterol: 84 mg/dL (ref 0–99)
NonHDL: 90.33
Total CHOL/HDL Ratio: 2
Triglycerides: 31 mg/dL (ref 0.0–149.0)
VLDL: 6.2 mg/dL (ref 0.0–40.0)

## 2021-07-07 LAB — HEPATIC FUNCTION PANEL
ALT: 16 U/L (ref 0–53)
AST: 25 U/L (ref 0–37)
Albumin: 4.2 g/dL (ref 3.5–5.2)
Alkaline Phosphatase: 80 U/L (ref 39–117)
Bilirubin, Direct: 0.1 mg/dL (ref 0.0–0.3)
Total Bilirubin: 0.5 mg/dL (ref 0.2–1.2)
Total Protein: 6.6 g/dL (ref 6.0–8.3)

## 2021-07-07 LAB — BASIC METABOLIC PANEL
BUN: 9 mg/dL (ref 6–23)
CO2: 30 mEq/L (ref 19–32)
Calcium: 9.2 mg/dL (ref 8.4–10.5)
Chloride: 104 mEq/L (ref 96–112)
Creatinine, Ser: 1.32 mg/dL (ref 0.40–1.50)
GFR: 51.32 mL/min — ABNORMAL LOW (ref >60.00)
Glucose, Bld: 82 mg/dL (ref 70–99)
Potassium: 3.8 mEq/L (ref 3.5–5.1)
Sodium: 139 mEq/L (ref 135–145)

## 2021-07-08 ENCOUNTER — Other Ambulatory Visit: Payer: Self-pay | Admitting: Urology

## 2021-07-08 DIAGNOSIS — N138 Other obstructive and reflux uropathy: Secondary | ICD-10-CM

## 2021-07-11 ENCOUNTER — Ambulatory Visit (INDEPENDENT_AMBULATORY_CARE_PROVIDER_SITE_OTHER): Payer: Medicare Other | Admitting: Internal Medicine

## 2021-07-11 ENCOUNTER — Other Ambulatory Visit: Payer: Self-pay

## 2021-07-11 ENCOUNTER — Encounter: Payer: Self-pay | Admitting: Internal Medicine

## 2021-07-11 VITALS — BP 128/86 | HR 64 | Temp 96.0°F | Ht 70.0 in | Wt 156.6 lb

## 2021-07-11 DIAGNOSIS — E538 Deficiency of other specified B group vitamins: Secondary | ICD-10-CM

## 2021-07-11 DIAGNOSIS — Z23 Encounter for immunization: Secondary | ICD-10-CM

## 2021-07-11 DIAGNOSIS — Z125 Encounter for screening for malignant neoplasm of prostate: Secondary | ICD-10-CM | POA: Diagnosis not present

## 2021-07-11 DIAGNOSIS — D696 Thrombocytopenia, unspecified: Secondary | ICD-10-CM | POA: Diagnosis not present

## 2021-07-11 DIAGNOSIS — D72819 Decreased white blood cell count, unspecified: Secondary | ICD-10-CM | POA: Diagnosis not present

## 2021-07-11 DIAGNOSIS — N400 Enlarged prostate without lower urinary tract symptoms: Secondary | ICD-10-CM

## 2021-07-11 DIAGNOSIS — E041 Nontoxic single thyroid nodule: Secondary | ICD-10-CM | POA: Diagnosis not present

## 2021-07-11 DIAGNOSIS — K219 Gastro-esophageal reflux disease without esophagitis: Secondary | ICD-10-CM | POA: Diagnosis not present

## 2021-07-11 DIAGNOSIS — I10 Essential (primary) hypertension: Secondary | ICD-10-CM

## 2021-07-11 DIAGNOSIS — E611 Iron deficiency: Secondary | ICD-10-CM

## 2021-07-11 DIAGNOSIS — J4520 Mild intermittent asthma, uncomplicated: Secondary | ICD-10-CM | POA: Diagnosis not present

## 2021-07-11 LAB — IBC + FERRITIN
Ferritin: 18.1 ng/mL — ABNORMAL LOW (ref 22.0–322.0)
Iron: 60 ug/dL (ref 42–165)
Saturation Ratios: 16.2 % — ABNORMAL LOW (ref 20.0–50.0)
TIBC: 371 ug/dL (ref 250.0–450.0)
Transferrin: 265 mg/dL (ref 212.0–360.0)

## 2021-07-11 LAB — CBC WITH DIFFERENTIAL/PLATELET
Basophils Absolute: 0 10*3/uL (ref 0.0–0.1)
Basophils Relative: 1 % (ref 0.0–3.0)
Eosinophils Absolute: 0.2 10*3/uL (ref 0.0–0.7)
Eosinophils Relative: 6.2 % — ABNORMAL HIGH (ref 0.0–5.0)
HCT: 40.9 % (ref 39.0–52.0)
Hemoglobin: 13 g/dL (ref 13.0–17.0)
Lymphocytes Relative: 38.1 % (ref 12.0–46.0)
Lymphs Abs: 0.9 10*3/uL (ref 0.7–4.0)
MCHC: 31.9 g/dL (ref 30.0–36.0)
MCV: 83.4 fl (ref 78.0–100.0)
Monocytes Absolute: 0.3 10*3/uL (ref 0.1–1.0)
Monocytes Relative: 10.7 % (ref 3.0–12.0)
Neutro Abs: 1.1 10*3/uL — ABNORMAL LOW (ref 1.4–7.7)
Neutrophils Relative %: 44 % (ref 43.0–77.0)
Platelets: 122 10*3/uL — ABNORMAL LOW (ref 150.0–400.0)
RBC: 4.9 Mil/uL (ref 4.22–5.81)
RDW: 14.8 % (ref 11.5–15.5)
WBC: 2.5 10*3/uL — ABNORMAL LOW (ref 4.0–10.5)

## 2021-07-11 LAB — PSA, MEDICARE: PSA: 2.86 ng/ml (ref 0.10–4.00)

## 2021-07-11 LAB — VITAMIN B12: Vitamin B-12: 1025 pg/mL — ABNORMAL HIGH (ref 211–911)

## 2021-07-11 NOTE — Progress Notes (Signed)
Patient ID: Jose Perry, male   DOB: 16-Mar-1941, 80 y.o.   MRN: [id number]   Subjective:    Patient ID: Jose Perry, male    DOB: January 28, 1941, 80 y.o.   MRN: [id number]  This visit occurred during the SARS-CoV-2 public health emergency.  Safety protocols were in place, including screening questions prior to the visit, additional usage of staff PPE, and extensive cleaning of exam room while observing appropriate contact time as indicated for disinfecting solutions.   Patient here for a scheduled follow up.    HPI Here to follow up regarding his blood pressure.  States he is doing well.  Feels good.  Stays active.  No chest pain or increased sob reported.  No abdominal pain or bowel change reported.  Has f/u with urology this week.     Past Medical History:  Diagnosis Date   Asthma    Dyspnea    Elevated blood pressure    Enlarged prostate    GERD (gastroesophageal reflux disease)    Glaucoma    History of adenomatous polyp of colon    Hx of colonic polyps    Hypertension    Leukopenia    has had an extensive w/up.     Liver hemangioma 01/24/2018   Thyroid goiter    Past Surgical History:  Procedure Laterality Date   BACK SURGERY  2002   ruptured disc   CATARACT EXTRACTION W/PHACO Right 06/30/2020   Procedure: CATARACT EXTRACTION PHACO AND INTRAOCULAR LENS PLACEMENT (IOC) RIGHT, Malyugin 7.19 01:30.8 7.9%;  Surgeon: Leandrew Koyanagi, MD;  Location: [place];  Service: Ophthalmology;  Laterality: Right;   COLONOSCOPY W/ POLYPECTOMY  04/24/2005, 07/06/2010, 07/08/2014   COLONOSCOPY WITH PROPOFOL N/A 02/01/2018   Procedure: COLONOSCOPY WITH PROPOFOL;  Surgeon: Manya Silvas, MD;  Location: [facility] ENDOSCOPY;  Service: Endoscopy;  Laterality: N/A;   ESOPHAGOGASTRODUODENOSCOPY  04/24/2005, 07/06/2010,   EYE SURGERY  2009   relieve pressure from glaucoma   THYROID SURGERY  1968   goiter   Family History  Problem Relation Age of Onset   Heart disease Mother     Alcohol abuse Father    Hyperlipidemia Father    Stroke Father    Social History   Socioeconomic History   Marital status: Married    Spouse name: Not on file   Number of children: Not on file   Years of education: Not on file   Highest education level: Not on file  Occupational History   Not on file  Tobacco Use   Smoking status: Never   Smokeless tobacco: Never  Vaping Use   Vaping Use: Never used  Substance and Sexual Activity   Alcohol use: No    Alcohol/week: 0.0 standard drinks   Drug use: No   Sexual activity: Yes  Other Topics Concern   Not on file  Social History Narrative   Not on file   Social Determinants of Health   Financial Resource Strain: Low Risk    Difficulty of Paying Living Expenses: Not hard at all  Food Insecurity: No Food Insecurity   Worried About Charity fundraiser in the Last Year: Never true   [place] in the Last Year: Never true  Transportation Needs: No Transportation Needs   Lack of Transportation (Medical): No   Lack of Transportation (Non-Medical): No  Physical Activity: Sufficiently Active   Days of Exercise per Week: 7 days   Minutes of Exercise per Session: 60 min  Stress:  No Stress Concern Present   Feeling of Stress : Not at all  Social Connections: Unknown   Frequency of Communication with Friends and Family: Not on file   Frequency of Social Gatherings with Friends and Family: Not on file   Attends Religious Services: Not on file   Active Member of Clubs or Organizations: Not on file   Attends Archivist Meetings: Not on file   Marital Status: Married     Review of Systems  Constitutional:  Negative for appetite change and unexpected weight change.  HENT:  Negative for congestion and sinus pressure.   Respiratory:  Negative for cough, chest tightness and shortness of breath.   Cardiovascular:  Negative for chest pain and palpitations.  Gastrointestinal:  Negative for abdominal pain, diarrhea, nausea  and vomiting.  Genitourinary:  Negative for difficulty urinating and dysuria.  Musculoskeletal:  Negative for joint swelling and myalgias.  Skin:  Negative for color change and rash.  Neurological:  Negative for dizziness, light-headedness and headaches.  Psychiatric/Behavioral:  Negative for agitation and dysphoric mood.        Objective:     BP 128/86   Pulse 64   Temp (!) 96 F (35.6 C)   Ht 5\' 10"  (1.778 m)   Wt 156 lb 9.6 oz (71 kg)   SpO2 98%   BMI 22.47 kg/m  Wt Readings from Last 3 Encounters:  07/15/21 156 lb (70.8 kg)  07/11/21 156 lb 9.6 oz (71 kg)  11/09/20 161 lb (73 kg)    Physical Exam Constitutional:      General: He is not in acute distress.    Appearance: Normal appearance. He is well-developed.  HENT:     Head: Normocephalic and atraumatic.     Right Ear: External ear normal.     Left Ear: External ear normal.  Eyes:     General: No scleral icterus.       Right eye: No discharge.        Left eye: No discharge.  Cardiovascular:     Rate and Rhythm: Normal rate and regular rhythm.  Pulmonary:     Effort: Pulmonary effort is normal. No respiratory distress.     Breath sounds: Normal breath sounds.  Abdominal:     General: Bowel sounds are normal.     Palpations: Abdomen is soft.     Tenderness: There is no abdominal tenderness.  Musculoskeletal:        General: No swelling or tenderness.     Cervical back: Neck supple. No tenderness.  Lymphadenopathy:     Cervical: No cervical adenopathy.  Skin:    Findings: No erythema or rash.  Neurological:     Mental Status: He is alert.  Psychiatric:        Mood and Affect: Mood normal.        Behavior: Behavior normal.     Outpatient Encounter Medications as of 07/11/2021  Medication Sig   albuterol (VENTOLIN HFA) 108 (90 Base) MCG/ACT inhaler INHALE 2 PUFFS BY MOUTH EVERY 6 HOURS AS NEEDED   amLODipine (NORVASC) 5 MG tablet TAKE 1 TABLET BY MOUTH EVERY DAY   BREO ELLIPTA 100-25 MCG/INH AEPB TAKE  1 PUFF BY MOUTH EVERY DAY   latanoprost (XALATAN) 0.005 % ophthalmic solution ADMINISTER 1 DROP TO THE RIGHT EYE NIGHTLY.   omeprazole (PRILOSEC) 40 MG capsule TAKE 1 CAPSULE (40 MG TOTAL) BY MOUTH DAILY.   vitamin B-12 (CYANOCOBALAMIN) 1000 MCG tablet Take by mouth.   [DISCONTINUED]  tamsulosin (FLOMAX) 0.4 MG CAPS capsule TAKE 1 CAPSULE BY MOUTH EVERY DAY   No facility-administered encounter medications on file as of 07/11/2021.     Lab Results  Component Value Date   WBC 2.5 (L) 07/11/2021   HGB 13.0 07/11/2021   HCT 40.9 07/11/2021   PLT 122.0 (L) 07/11/2021   GLUCOSE 82 07/07/2021   CHOL 164 07/07/2021   TRIG 31.0 07/07/2021   HDL 73.30 07/07/2021   LDLCALC 84 07/07/2021   ALT 16 07/07/2021   AST 25 07/07/2021   NA 139 07/07/2021   K 3.8 07/07/2021   CL 104 07/07/2021   CREATININE 1.32 07/07/2021   BUN 9 07/07/2021   CO2 30 07/07/2021   TSH 2.13 11/09/2020   PSA 2.86 07/11/2021       Assessment & Plan:   Problem List Items Addressed This Visit     Asthma    Using breo.  Breathing stable.  Follow.       B12 deficiency    Check B12 level.        Relevant Orders   Vitamin B12 (Completed)   Enlarged prostate    Has been followed by urology.  Has f/u this week.        GERD (gastroesophageal reflux disease)    Controlled on prilosec.       Hypertension - Primary    Continue amlodipine.  Blood pressure as outlined.  Follow metabolic panel.       Iron deficiency    Saw GI.  2019 - colonoscopy.  Previous hemoccult cards negative.  Elected to follow.  See GI note.  Recheck iron studies.       Relevant Orders   IBC + Ferritin (Completed)   Leukopenia    Has been previously evaluated by hematology.  Recheck cbc today.       Relevant Orders   CBC with Differential/Platelet (Completed)   Thrombocytopenia (HCC)    Follow cbc.  Has been stable.       Thyroid nodule    Evaluated by endocrinology 09/23/20.  Stable.  Recommended f/u in one year with  ultrasound prior.        Other Visit Diagnoses     Need for immunization against influenza       Relevant Orders   Flu Vaccine QUAD High Dose(Fluad) (Completed)   Prostate cancer screening       Relevant Orders   PSA, Medicare ( [place] only) (Completed)        Einar Pheasant, MD

## 2021-07-14 ENCOUNTER — Other Ambulatory Visit: Payer: Self-pay

## 2021-07-14 DIAGNOSIS — E611 Iron deficiency: Secondary | ICD-10-CM

## 2021-07-15 ENCOUNTER — Encounter: Payer: Self-pay | Admitting: Urology

## 2021-07-15 ENCOUNTER — Encounter: Payer: Self-pay | Admitting: Hematology and Oncology

## 2021-07-15 ENCOUNTER — Other Ambulatory Visit: Payer: Self-pay

## 2021-07-15 ENCOUNTER — Ambulatory Visit (INDEPENDENT_AMBULATORY_CARE_PROVIDER_SITE_OTHER): Payer: Medicare Other | Admitting: Urology

## 2021-07-15 VITALS — BP 139/81 | HR 65 | Ht 70.0 in | Wt 156.0 lb

## 2021-07-15 DIAGNOSIS — N401 Enlarged prostate with lower urinary tract symptoms: Secondary | ICD-10-CM | POA: Diagnosis not present

## 2021-07-15 DIAGNOSIS — N138 Other obstructive and reflux uropathy: Secondary | ICD-10-CM

## 2021-07-15 LAB — BLADDER SCAN AMB NON-IMAGING

## 2021-07-15 MED ORDER — TAMSULOSIN HCL 0.4 MG PO CAPS: 0.4000 mg | ORAL_CAPSULE | Freq: Every day | ORAL | 3 refills | Status: DC

## 2021-07-15 NOTE — Progress Notes (Signed)
07/15/2021 12:30 PM   Jose Perry 1941-04-05 [id number]  Referring provider: Einar Pheasant, [address] [place],  [place] [address]  Chief Complaint  Patient presents with   Follow-up    1 year follow-up    Urologic history:   1.  BPH with lower urinary tract symptoms -Tamsulosin daily           HPI: 80 y.o. male presents for annual follow-up.  Doing well since last visit No bothersome LUTS Remains on tamsulosin Denies dysuria, gross hematuria Denies flank, abdominal or pelvic pain  PMH: Past Medical History:  Diagnosis Date   Asthma    Dyspnea    Elevated blood pressure    Enlarged prostate    GERD (gastroesophageal reflux disease)    Glaucoma    History of adenomatous polyp of colon    Hx of colonic polyps    Hypertension    Leukopenia    has had an extensive w/up.     Liver hemangioma 01/24/2018   Thyroid goiter     Surgical History: Past Surgical History:  Procedure Laterality Date   BACK SURGERY  2002   ruptured disc   CATARACT EXTRACTION W/PHACO Right 06/30/2020   Procedure: CATARACT EXTRACTION PHACO AND INTRAOCULAR LENS PLACEMENT (IOC) RIGHT, Malyugin 7.19 01:30.8 7.9%;  Surgeon: Leandrew Koyanagi, MD;  Location: [place];  Service: Ophthalmology;  Laterality: Right;   COLONOSCOPY W/ POLYPECTOMY  04/24/2005, 07/06/2010, 07/08/2014   COLONOSCOPY WITH PROPOFOL N/A 02/01/2018   Procedure: COLONOSCOPY WITH PROPOFOL;  Surgeon: Manya Silvas, MD;  Location: [facility] ENDOSCOPY;  Service: Endoscopy;  Laterality: N/A;   ESOPHAGOGASTRODUODENOSCOPY  04/24/2005, 07/06/2010,   EYE SURGERY  2009   relieve pressure from glaucoma   THYROID SURGERY  1968   goiter    Home Medications:  Allergies as of 07/15/2021       Reactions   Brimonidine Tartrate    Other reaction(s): Eye redness   Brinzolamide-brimonidine    Other reaction(s): Eye redness   Other Other (See Comments)   Simbrinza Eye Drops gives pt Eye redness         Medication List        Accurate as of July 15, 2021 12:30 PM. If you have any questions, ask your nurse or doctor.          albuterol 108 (90 Base) MCG/ACT inhaler Commonly known as: VENTOLIN HFA INHALE 2 PUFFS BY MOUTH EVERY 6 HOURS AS NEEDED   amLODipine 5 MG tablet Commonly known as: NORVASC TAKE 1 TABLET BY MOUTH EVERY DAY   Breo Ellipta 100-25 MCG/INH Aepb Generic drug: fluticasone furoate-vilanterol TAKE 1 PUFF BY MOUTH EVERY DAY   latanoprost 0.005 % ophthalmic solution Commonly known as: XALATAN ADMINISTER 1 DROP TO THE RIGHT EYE NIGHTLY.   omeprazole 40 MG capsule Commonly known as: PRILOSEC TAKE 1 CAPSULE (40 MG TOTAL) BY MOUTH DAILY.   tamsulosin 0.4 MG Caps capsule Commonly known as: FLOMAX TAKE 1 CAPSULE BY MOUTH EVERY DAY   vitamin B-12 1000 MCG tablet Commonly known as: CYANOCOBALAMIN Take by mouth.        Allergies:  Allergies  Allergen Reactions   Brimonidine Tartrate     Other reaction(s): Eye redness   Brinzolamide-Brimonidine     Other reaction(s): Eye redness   Other Other (See Comments)    Simbrinza Eye Drops gives pt Eye redness    Family History: Family History  Problem Relation Age of Onset   Heart disease Mother  Alcohol abuse Father    Hyperlipidemia Father    Stroke Father     Social History:  reports that he has never smoked. He has never used smokeless tobacco. He reports that he does not drink alcohol and does not use drugs.   Physical Exam: BP 139/81   Pulse 65   Ht 5\' 10"  (1.778 m)   Wt 156 lb (70.8 kg)   BMI 22.38 kg/m   Constitutional:  Alert and oriented, No acute distress. HEENT: [place] AT, moist mucus membranes.  Trachea midline, no masses. Cardiovascular: No clubbing, cyanosis, or edema. Respiratory: Normal respiratory effort, no increased work of breathing. GU: Prostate 60 g, smooth without nodules Skin: No rashes, bruises or suspicious lesions. Neurologic: Grossly intact, no focal  deficits, moving all 4 extremities. Psychiatric: Normal mood and affect.   Assessment & Plan:    1. BPH with lower urinary tract symptoms Stable LUTS on tamsulosin Bladder scan PVR 119 Tamsulosin refill sent Continue annual follow-up   [name], MD  [facility] [address], [place], [place] [address] [phone]

## 2021-07-16 ENCOUNTER — Encounter: Payer: Self-pay | Admitting: Internal Medicine

## 2021-07-16 NOTE — Assessment & Plan Note (Signed)
Saw GI.  2019 - colonoscopy.  Previous hemoccult cards negative.  Elected to follow.  See GI note.  Recheck iron studies.

## 2021-07-16 NOTE — Assessment & Plan Note (Signed)
Has been previously evaluated by hematology.  Recheck cbc today.

## 2021-07-16 NOTE — Assessment & Plan Note (Signed)
Check B12 level. 

## 2021-07-16 NOTE — Assessment & Plan Note (Signed)
Continue amlodipine.  Blood pressure as outlined.  Follow metabolic panel.

## 2021-07-16 NOTE — Assessment & Plan Note (Signed)
Using breo.  Breathing stable.  Follow.

## 2021-07-16 NOTE — Assessment & Plan Note (Signed)
Has been followed by urology.  Has f/u this week.

## 2021-07-16 NOTE — Assessment & Plan Note (Signed)
Controlled on prilosec.   

## 2021-07-16 NOTE — Assessment & Plan Note (Signed)
Follow cbc.  Has been stable.

## 2021-07-16 NOTE — Assessment & Plan Note (Signed)
Evaluated by endocrinology 09/23/20.  Stable.  Recommended f/u in one year with ultrasound prior.

## 2021-08-02 DIAGNOSIS — H35351 Cystoid macular degeneration, right eye: Secondary | ICD-10-CM | POA: Diagnosis not present

## 2021-08-02 DIAGNOSIS — H401112 Primary open-angle glaucoma, right eye, moderate stage: Secondary | ICD-10-CM | POA: Diagnosis not present

## 2021-08-09 ENCOUNTER — Telehealth: Payer: Self-pay | Admitting: Internal Medicine

## 2021-08-09 NOTE — Telephone Encounter (Signed)
Per note, patient has upcoming appt with NP regarding BP.

## 2021-08-09 NOTE — Telephone Encounter (Signed)
Patient called stating his blood pressure has been going up and down for the last 2 weeks. His reading are 150/80 to 160/80 in the mornings after medication 125/82-130/77 after 5 pm is goes back up to 153/80. I asked how he was feeling he stated he felt fine no symptoms of dizziness , headache.  He has been scheduled with Dutch Quint on 08/11/21 @ 3 pm.

## 2021-08-11 ENCOUNTER — Other Ambulatory Visit: Payer: Self-pay

## 2021-08-11 ENCOUNTER — Other Ambulatory Visit: Payer: Medicare Other

## 2021-08-11 ENCOUNTER — Ambulatory Visit (INDEPENDENT_AMBULATORY_CARE_PROVIDER_SITE_OTHER): Payer: Medicare Other | Admitting: Family

## 2021-08-11 VITALS — BP 150/80 | HR 62 | Temp 97.6°F | Ht 70.0 in | Wt 159.0 lb

## 2021-08-11 DIAGNOSIS — N400 Enlarged prostate without lower urinary tract symptoms: Secondary | ICD-10-CM

## 2021-08-11 DIAGNOSIS — I10 Essential (primary) hypertension: Secondary | ICD-10-CM

## 2021-08-11 DIAGNOSIS — E611 Iron deficiency: Secondary | ICD-10-CM | POA: Diagnosis not present

## 2021-08-11 MED ORDER — LOSARTAN POTASSIUM 50 MG PO TABS: 50.0000 mg | ORAL_TABLET | Freq: Every day | ORAL | 1 refills | Status: DC

## 2021-08-11 NOTE — Progress Notes (Signed)
Acute Office Visit  Subjective:    Patient ID: Jose Perry, male    DOB: 12-19-1940, 80 y.o.   MRN: [id number]  Chief Complaint  Patient presents with  . Acute Visit    Elevated BP    HPI Patient is in today with c/o elevated blood pressure that he has noticed over the last 2 weeks. Reports feeling as though this occurred after obtaining the COVID-19 booster vaccine. He reports more sodium than usual. Currently taking Norvasc 5mg  once daily.  Has mild swelling in his lower extremities and wear loose fitting socks to help. He has a history of enlarged prostate and has urinary frequency.   Past Medical History:  Diagnosis Date  . Asthma   . Dyspnea   . Elevated blood pressure   . Enlarged prostate   . GERD (gastroesophageal reflux disease)   . Glaucoma   . History of adenomatous polyp of colon   . Hx of colonic polyps   . Hypertension   . Leukopenia    has had an extensive w/up.    . Liver hemangioma 01/24/2018  . Thyroid goiter     Past Surgical History:  Procedure Laterality Date  . BACK SURGERY  2002   ruptured disc  . CATARACT EXTRACTION W/PHACO Right 06/30/2020   Procedure: CATARACT EXTRACTION PHACO AND INTRAOCULAR LENS PLACEMENT (IOC) RIGHT, Malyugin 7.19 01:30.8 7.9%;  Surgeon: Leandrew Koyanagi, MD;  Location: [place];  Service: Ophthalmology;  Laterality: Right;  . COLONOSCOPY W/ POLYPECTOMY  04/24/2005, 07/06/2010, 07/08/2014  . COLONOSCOPY WITH PROPOFOL N/A 02/01/2018   Procedure: COLONOSCOPY WITH PROPOFOL;  Surgeon: Manya Silvas, MD;  Location: [facility] ENDOSCOPY;  Service: Endoscopy;  Laterality: N/A;  . ESOPHAGOGASTRODUODENOSCOPY  04/24/2005, 07/06/2010,  . EYE SURGERY  2009   relieve pressure from glaucoma  . THYROID SURGERY  1968   goiter    Family History  Problem Relation Age of Onset  . Heart disease Mother   . Alcohol abuse Father   . Hyperlipidemia Father   . Stroke Father     Social History   Socioeconomic History  . Marital  status: Married    Spouse name: Not on file  . Number of children: Not on file  . Years of education: Not on file  . Highest education level: Not on file  Occupational History  . Not on file  Tobacco Use  . Smoking status: Never  . Smokeless tobacco: Never  Vaping Use  . Vaping Use: Never used  Substance and Sexual Activity  . Alcohol use: No    Alcohol/week: 0.0 standard drinks  . Drug use: No  . Sexual activity: Yes  Other Topics Concern  . Not on file  Social History Narrative  . Not on file   Social Determinants of Health   Financial Resource Strain: Low Risk   . Difficulty of Paying Living Expenses: Not hard at all  Food Insecurity: No Food Insecurity  . Worried About Charity fundraiser in the Last Year: Never true  . Ran Out of Food in the Last Year: Never true  Transportation Needs: No Transportation Needs  . Lack of Transportation (Medical): No  . Lack of Transportation (Non-Medical): No  Physical Activity: Sufficiently Active  . Days of Exercise per Week: 7 days  . Minutes of Exercise per Session: 60 min  Stress: No Stress Concern Present  . Feeling of Stress : Not at all  Social Connections: Unknown  . Frequency of Communication with Friends  and Family: Not on file  . Frequency of Social Gatherings with Friends and Family: Not on file  . Attends Religious Services: Not on file  . Active Member of Clubs or Organizations: Not on file  . Attends Archivist Meetings: Not on file  . Marital Status: Married  Human resources officer Violence: Not At Risk  . Fear of Current or Ex-Partner: No  . Emotionally Abused: No  . Physically Abused: No  . Sexually Abused: No    Outpatient Medications Prior to Visit  Medication Sig Dispense Refill  . ACULAR 0.5 % ophthalmic solution Place 1 drop into the right eye 4 (four) times daily.    Marland Kitchen albuterol (VENTOLIN HFA) 108 (90 Base) MCG/ACT inhaler INHALE 2 PUFFS BY MOUTH EVERY 6 HOURS AS NEEDED 18 each 2  . amLODipine  (NORVASC) 5 MG tablet TAKE 1 TABLET BY MOUTH EVERY DAY 90 tablet 1  . BREO ELLIPTA 100-25 MCG/INH AEPB TAKE 1 PUFF BY MOUTH EVERY DAY 180 each 2  . latanoprost (XALATAN) 0.005 % ophthalmic solution ADMINISTER 1 DROP TO THE RIGHT EYE NIGHTLY.    Marland Kitchen omeprazole (PRILOSEC) 40 MG capsule TAKE 1 CAPSULE (40 MG TOTAL) BY MOUTH DAILY. 90 capsule 1  . prednisoLONE acetate (PRED FORTE) 1 % ophthalmic suspension Place 1 drop into the right eye 4 (four) times daily.    . tamsulosin (FLOMAX) 0.4 MG CAPS capsule Take 1 capsule (0.4 mg total) by mouth daily. 90 capsule 3  . vitamin B-12 (CYANOCOBALAMIN) 1000 MCG tablet Take by mouth.     No facility-administered medications prior to visit.    Allergies  Allergen Reactions  . Brimonidine Tartrate     Other reaction(s): Eye redness  . Brinzolamide-Brimonidine     Other reaction(s): Eye redness  . Other Other (See Comments)    Simbrinza Eye Drops gives pt Eye redness    Review of Systems  Constitutional: Negative.   Respiratory: Negative.    Cardiovascular:  Positive for leg swelling. Negative for chest pain and palpitations.  Musculoskeletal: Negative.   Neurological: Negative.   All other systems reviewed and are negative.     Objective:    Physical Exam Vitals and nursing note reviewed.  Constitutional:      Appearance: Normal appearance.  Cardiovascular:     Rate and Rhythm: Normal rate and regular rhythm.     Pulses: Normal pulses.     Heart sounds: Normal heart sounds.  Pulmonary:     Effort: Pulmonary effort is normal.     Breath sounds: Normal breath sounds.  Abdominal:     General: Abdomen is flat.  Musculoskeletal:        General: Normal range of motion.     Cervical back: Normal range of motion.     Right lower leg: No edema.     Left lower leg: No edema.     Comments: 1+ pitting edema noted bilaterally   Skin:    General: Skin is warm and dry.  Neurological:     Mental Status: He is alert and oriented to person, place,  and time.  Psychiatric:        Mood and Affect: Mood normal.        Behavior: Behavior normal.   BP (!) 150/80   Pulse 62   Temp 97.6 F (36.4 C) (Oral)   Ht 5\' 10"  (1.778 m)   Wt 159 lb (72.1 kg)   SpO2 99%   BMI 22.81 kg/m  Wt Readings from  Last 3 Encounters:  08/11/21 159 lb (72.1 kg)  07/15/21 156 lb (70.8 kg)  07/11/21 156 lb 9.6 oz (71 kg)    Health Maintenance Due  Topic Date Due  . Zoster Vaccines- Shingrix (2 of 2) 08/31/2018    There are no preventive care reminders to display for this patient.   Lab Results  Component Value Date   TSH 2.13 11/09/2020   Lab Results  Component Value Date   WBC 2.5 (L) 07/11/2021   HGB 13.0 07/11/2021   HCT 40.9 07/11/2021   MCV 83.4 07/11/2021   PLT 122.0 (L) 07/11/2021   Lab Results  Component Value Date   NA 139 07/07/2021   K 3.8 07/07/2021   CO2 30 07/07/2021   GLUCOSE 82 07/07/2021   BUN 9 07/07/2021   CREATININE 1.32 07/07/2021   BILITOT 0.5 07/07/2021   ALKPHOS 80 07/07/2021   AST 25 07/07/2021   ALT 16 07/07/2021   PROT 6.6 07/07/2021   ALBUMIN 4.2 07/07/2021   CALCIUM 9.2 07/07/2021   ANIONGAP 7 07/27/2018   GFR 51.32 (L) 07/07/2021   Lab Results  Component Value Date   CHOL 164 07/07/2021   Lab Results  Component Value Date   HDL 73.30 07/07/2021   Lab Results  Component Value Date   LDLCALC 84 07/07/2021   Lab Results  Component Value Date   TRIG 31.0 07/07/2021   Lab Results  Component Value Date   CHOLHDL 2 07/07/2021   No results found for: HGBA1C     Assessment & Plan:   Problem List Items Addressed This Visit     Enlarged prostate   Other Visit Diagnoses     Hypertension, uncontrolled    -  Primary   Relevant Medications   losartan (COZAAR) 50 MG tablet        Meds ordered this encounter  Medications  . losartan (COZAAR) 50 MG tablet    Sig: Take 1 tablet (50 mg total) by mouth daily.    Dispense:  30 tablet    Refill:  1   Plan: HCTZ not initiated due to  prostate enlargement and urinary frequency. Will start Cozaar. If swelling in the legs persist, we will d/c Norvasc and increase Cozaar if he tolerates it well. Continue exercising daily. Low sodium diet. Call the office if symptoms worsen or persist.    Kennyth Arnold, FNP

## 2021-08-12 LAB — IRON,TIBC AND FERRITIN PANEL
%SAT: 15 % (calc) — ABNORMAL LOW (ref 20–48)
Ferritin: 17 ng/mL — ABNORMAL LOW (ref 24–380)
Iron: 53 ug/dL (ref 50–180)
TIBC: 358 mcg/dL (calc) (ref 250–425)

## 2021-08-12 LAB — CBC WITH DIFFERENTIAL/PLATELET
Basophils Absolute: 0 10*3/uL (ref 0.0–0.1)
Basophils Relative: 0.4 % (ref 0.0–3.0)
Eosinophils Absolute: 0.1 10*3/uL (ref 0.0–0.7)
Eosinophils Relative: 3.5 % (ref 0.0–5.0)
HCT: 41.2 % (ref 39.0–52.0)
Hemoglobin: 13.1 g/dL (ref 13.0–17.0)
Lymphocytes Relative: 40.1 % (ref 12.0–46.0)
Lymphs Abs: 1.1 10*3/uL (ref 0.7–4.0)
MCHC: 31.7 g/dL (ref 30.0–36.0)
MCV: 83.4 fl (ref 78.0–100.0)
Monocytes Absolute: 0.3 10*3/uL (ref 0.1–1.0)
Monocytes Relative: 9.7 % (ref 3.0–12.0)
Neutro Abs: 1.2 10*3/uL — ABNORMAL LOW (ref 1.4–7.7)
Neutrophils Relative %: 46.3 % (ref 43.0–77.0)
Platelets: 137 10*3/uL — ABNORMAL LOW (ref 150.0–400.0)
RBC: 4.94 Mil/uL (ref 4.22–5.81)
RDW: 14.8 % (ref 11.5–15.5)
WBC: 2.7 10*3/uL — ABNORMAL LOW (ref 4.0–10.5)

## 2021-08-15 DIAGNOSIS — H35351 Cystoid macular degeneration, right eye: Secondary | ICD-10-CM | POA: Diagnosis not present

## 2021-08-15 NOTE — Addendum Note (Signed)
Addended by: Elpidio Galea T on: 08/15/2021 01:34 PM   Modules accepted: Orders

## 2021-08-20 ENCOUNTER — Other Ambulatory Visit: Payer: Self-pay | Admitting: Internal Medicine

## 2021-09-02 ENCOUNTER — Other Ambulatory Visit: Payer: Self-pay | Admitting: Family

## 2021-09-09 ENCOUNTER — Other Ambulatory Visit: Payer: Self-pay | Admitting: Internal Medicine

## 2021-09-12 DIAGNOSIS — H35351 Cystoid macular degeneration, right eye: Secondary | ICD-10-CM | POA: Diagnosis not present

## 2021-09-19 DIAGNOSIS — E042 Nontoxic multinodular goiter: Secondary | ICD-10-CM | POA: Diagnosis not present

## 2021-09-26 DIAGNOSIS — K219 Gastro-esophageal reflux disease without esophagitis: Secondary | ICD-10-CM | POA: Diagnosis not present

## 2021-09-26 DIAGNOSIS — E611 Iron deficiency: Secondary | ICD-10-CM | POA: Diagnosis not present

## 2021-09-26 DIAGNOSIS — D1803 Hemangioma of intra-abdominal structures: Secondary | ICD-10-CM | POA: Diagnosis not present

## 2021-09-29 ENCOUNTER — Other Ambulatory Visit: Payer: Self-pay | Admitting: Internal Medicine

## 2021-09-29 DIAGNOSIS — E611 Iron deficiency: Secondary | ICD-10-CM

## 2021-09-29 NOTE — Progress Notes (Signed)
F/u labs ordered.   

## 2021-09-30 ENCOUNTER — Ambulatory Visit: Payer: Medicare Other

## 2021-10-04 ENCOUNTER — Ambulatory Visit: Payer: Medicare Other

## 2021-10-09 ENCOUNTER — Other Ambulatory Visit: Payer: Self-pay | Admitting: Family

## 2021-10-11 DIAGNOSIS — E042 Nontoxic multinodular goiter: Secondary | ICD-10-CM | POA: Diagnosis not present

## 2021-10-11 NOTE — Telephone Encounter (Signed)
Please call Jose Perry and see how many losartan tablets he has left.  Needs met b checked since starting losartan.  I do not mind refilling medication, but needs met b checked.  Also needs f/u with me.  Can work in.  Would like to know how blood pressures are doing.

## 2021-10-11 NOTE — Telephone Encounter (Signed)
Pt went to pharmacy to get medication and the pharmacy does not have it and pt is completely out of medication. It has been a week.

## 2021-10-12 ENCOUNTER — Other Ambulatory Visit: Payer: Self-pay

## 2021-10-12 DIAGNOSIS — I10 Essential (primary) hypertension: Secondary | ICD-10-CM

## 2021-10-12 MED ORDER — LOSARTAN POTASSIUM 50 MG PO TABS: 50.0000 mg | ORAL_TABLET | Freq: Every day | ORAL | 0 refills | Status: DC

## 2021-10-12 NOTE — Telephone Encounter (Signed)
Spoke with patient. Has f/u in Feb. BP was 120/78 yesterday. BMP ordered and scheduled. 30 day supply has been sent in per Dr Nicki Reaper

## 2021-10-14 ENCOUNTER — Other Ambulatory Visit: Payer: Self-pay

## 2021-10-14 ENCOUNTER — Ambulatory Visit (INDEPENDENT_AMBULATORY_CARE_PROVIDER_SITE_OTHER): Payer: Medicare Other

## 2021-10-14 ENCOUNTER — Other Ambulatory Visit (INDEPENDENT_AMBULATORY_CARE_PROVIDER_SITE_OTHER): Payer: Medicare Other

## 2021-10-14 VITALS — Ht 70.0 in | Wt 159.0 lb

## 2021-10-14 DIAGNOSIS — Z0000 Encounter for general adult medical examination without abnormal findings: Secondary | ICD-10-CM

## 2021-10-14 DIAGNOSIS — E611 Iron deficiency: Secondary | ICD-10-CM | POA: Diagnosis not present

## 2021-10-14 DIAGNOSIS — I10 Essential (primary) hypertension: Secondary | ICD-10-CM

## 2021-10-14 LAB — CBC WITH DIFFERENTIAL/PLATELET
Basophils Absolute: 0 10*3/uL (ref 0.0–0.1)
Basophils Relative: 1 % (ref 0.0–3.0)
Eosinophils Absolute: 0.1 10*3/uL (ref 0.0–0.7)
Eosinophils Relative: 6.6 % — ABNORMAL HIGH (ref 0.0–5.0)
HCT: 40.9 % (ref 39.0–52.0)
Hemoglobin: 13 g/dL (ref 13.0–17.0)
Lymphocytes Relative: 36.2 % (ref 12.0–46.0)
Lymphs Abs: 0.8 10*3/uL (ref 0.7–4.0)
MCHC: 31.8 g/dL (ref 30.0–36.0)
MCV: 84.2 fl (ref 78.0–100.0)
Monocytes Absolute: 0.3 10*3/uL (ref 0.1–1.0)
Monocytes Relative: 12.2 % — ABNORMAL HIGH (ref 3.0–12.0)
Neutro Abs: 1 10*3/uL — ABNORMAL LOW (ref 1.4–7.7)
Neutrophils Relative %: 44 % (ref 43.0–77.0)
Platelets: 122 10*3/uL — ABNORMAL LOW (ref 150.0–400.0)
RBC: 4.86 Mil/uL (ref 4.22–5.81)
RDW: 15.8 % — ABNORMAL HIGH (ref 11.5–15.5)
WBC: 2.3 10*3/uL — ABNORMAL LOW (ref 4.0–10.5)

## 2021-10-14 LAB — BASIC METABOLIC PANEL
BUN: 14 mg/dL (ref 6–23)
CO2: 26 mEq/L (ref 19–32)
Calcium: 9.4 mg/dL (ref 8.4–10.5)
Chloride: 103 mEq/L (ref 96–112)
Creatinine, Ser: 1.47 mg/dL (ref 0.40–1.50)
GFR: 45.02 mL/min — ABNORMAL LOW (ref >60.00)
Glucose, Bld: 83 mg/dL (ref 70–99)
Potassium: 4.2 mEq/L (ref 3.5–5.1)
Sodium: 138 mEq/L (ref 135–145)

## 2021-10-14 LAB — IBC + FERRITIN
Ferritin: 38.5 ng/mL (ref 22.0–322.0)
Iron: 78 ug/dL (ref 42–165)
Saturation Ratios: 21.8 % (ref 20.0–50.0)
TIBC: 357 ug/dL (ref 250.0–450.0)
Transferrin: 255 mg/dL (ref 212.0–360.0)

## 2021-10-14 NOTE — Patient Instructions (Addendum)
°  [name] , Thank you for taking time to come for your Medicare Wellness Visit. I appreciate your ongoing commitment to your health goals. Please review the following plan we discussed and let me know if I can assist you in the future.   These are the goals we discussed:  Goals      Maintain Healthy Lifestyle     Stay active Healthy diet Stay hydrated          This is a list of the screening recommended for you and due dates:  Health Maintenance  Topic Date Due   Tetanus Vaccine  07/11/2022*   Pneumonia Vaccine  Completed   Flu Shot  Completed   COVID-19 Vaccine  Completed   Hepatitis C Screening: USPSTF Recommendation to screen - Ages 66-79 yo.  Completed   Zoster (Shingles) Vaccine  Completed   HPV Vaccine  Aged Out  *Topic was postponed. The date shown is not the original due date.

## 2021-10-14 NOTE — Progress Notes (Signed)
Subjective:   Jose Perry is a 81 y.o. male who presents for Medicare Annual/Subsequent preventive examination.  Review of Systems    No ROS.  Medicare Wellness Virtual Visit.  Visual/audio telehealth visit, UTA vital signs.   See social history for additional risk factors.   Cardiac Risk Factors include: advanced age (>75men, >71 women);male gender;hypertension     Objective:    Today's Vitals   10/14/21 1533  Weight: 159 lb (72.1 kg)  Height: 5\' 10"  (1.778 m)   Body mass index is 22.81 kg/m.  Advanced Directives 09/29/2020 06/30/2020 09/26/2019 08/23/2018 07/27/2018 07/22/2018 05/14/2018  Does Patient Have a Medical Advance Directive? No Yes No No No No No  Type of Advance Directive - Healthcare Power of Universal;Living will - - - - -  Does patient want to make changes to medical advance directive? - No - Patient declined - No - Patient declined - - -  Copy of [place] in Chart? - No - copy requested - - - - -  Would patient like information on creating a medical advance directive? No - Patient declined - No - Patient declined - - No - Patient declined -    Current Medications (verified) Outpatient Encounter Medications as of 10/14/2021  Medication Sig   ACULAR 0.5 % ophthalmic solution Place 1 drop into the right eye 4 (four) times daily.   albuterol (VENTOLIN HFA) 108 (90 Base) MCG/ACT inhaler INHALE 2 PUFFS BY MOUTH EVERY 6 HOURS AS NEEDED   amLODipine (NORVASC) 5 MG tablet TAKE 1 TABLET BY MOUTH EVERY DAY   BREO ELLIPTA 100-25 MCG/INH AEPB TAKE 1 PUFF BY MOUTH EVERY DAY   latanoprost (XALATAN) 0.005 % ophthalmic solution ADMINISTER 1 DROP TO THE RIGHT EYE NIGHTLY.   losartan (COZAAR) 50 MG tablet Take 1 tablet (50 mg total) by mouth daily.   omeprazole (PRILOSEC) 40 MG capsule TAKE 1 CAPSULE (40 MG TOTAL) BY MOUTH DAILY.   prednisoLONE acetate (PRED FORTE) 1 % ophthalmic suspension Place 1 drop into the right eye 4 (four) times daily.   tamsulosin  (FLOMAX) 0.4 MG CAPS capsule Take 1 capsule (0.4 mg total) by mouth daily.   vitamin B-12 (CYANOCOBALAMIN) 1000 MCG tablet Take by mouth.   No facility-administered encounter medications on file as of 10/14/2021.    Allergies (verified) Brimonidine tartrate, Brinzolamide-brimonidine, and Other   History: Past Medical History:  Diagnosis Date   Asthma    Dyspnea    Elevated blood pressure    Enlarged prostate    GERD (gastroesophageal reflux disease)    Glaucoma    History of adenomatous polyp of colon    Hx of colonic polyps    Hypertension    Leukopenia    has had an extensive w/up.     Liver hemangioma 01/24/2018   Thyroid goiter    Past Surgical History:  Procedure Laterality Date   BACK SURGERY  2002   ruptured disc   CATARACT EXTRACTION W/PHACO Right 06/30/2020   Procedure: CATARACT EXTRACTION PHACO AND INTRAOCULAR LENS PLACEMENT (IOC) RIGHT, Malyugin 7.19 01:30.8 7.9%;  Surgeon: Leandrew Koyanagi, MD;  Location: [place];  Service: Ophthalmology;  Laterality: Right;   COLONOSCOPY W/ POLYPECTOMY  04/24/2005, 07/06/2010, 07/08/2014   COLONOSCOPY WITH PROPOFOL N/A 02/01/2018   Procedure: COLONOSCOPY WITH PROPOFOL;  Surgeon: Manya Silvas, MD;  Location: [facility] ENDOSCOPY;  Service: Endoscopy;  Laterality: N/A;   ESOPHAGOGASTRODUODENOSCOPY  04/24/2005, 07/06/2010,   EYE SURGERY  2009   relieve pressure  from glaucoma   THYROID SURGERY  1968   goiter   Family History  Problem Relation Age of Onset   Heart disease Mother    Alcohol abuse Father    Hyperlipidemia Father    Stroke Father    Social History   Socioeconomic History   Marital status: Married    Spouse name: Not on file   Number of children: Not on file   Years of education: Not on file   Highest education level: Not on file  Occupational History   Not on file  Tobacco Use   Smoking status: Never   Smokeless tobacco: Never  Vaping Use   Vaping Use: Never used  Substance and Sexual Activity    Alcohol use: No    Alcohol/week: 0.0 standard drinks   Drug use: No   Sexual activity: Yes  Other Topics Concern   Not on file  Social History Narrative   Not on file   Social Determinants of Health   Financial Resource Strain: Low Risk    Difficulty of Paying Living Expenses: Not hard at all  Food Insecurity: No Food Insecurity   Worried About Charity fundraiser in the Last Year: Never true   [place] in the Last Year: Never true  Transportation Needs: No Transportation Needs   Lack of Transportation (Medical): No   Lack of Transportation (Non-Medical): No  Physical Activity: Sufficiently Active   Days of Exercise per Week: 7 days   Minutes of Exercise per Session: 60 min  Stress: No Stress Concern Present   Feeling of Stress : Not at all  Social Connections: Unknown   Frequency of Communication with Friends and Family: Not on file   Frequency of Social Gatherings with Friends and Family: Not on file   Attends Religious Services: Not on Electrical engineer or Organizations: Not on file   Attends Archivist Meetings: Not on file   Marital Status: Married    Tobacco Counseling Counseling given: Not Answered   Clinical Intake:  Pre-visit preparation completed: Yes        Diabetes: No  How often do you need to have someone help you when you read instructions, pamphlets, or other written materials from your doctor or pharmacy?: 1 - Never  Interpreter Needed?: No      Activities of Daily Living In your present state of health, do you have any difficulty performing the following activities: 10/14/2021  Hearing? N  Vision? N  Difficulty concentrating or making decisions? N  Walking or climbing stairs? N  Dressing or bathing? N  Doing errands, shopping? N  Preparing Food and eating ? N  Using the Toilet? N  In the past six months, have you accidently leaked urine? N  Comment Followed by Urology  Do you have problems with loss of  bowel control? N  Managing your Medications? N  Managing your Finances? N  Housekeeping or managing your Housekeeping? N  Some recent data might be hidden    Patient Care Team: Einar Pheasant, MD as PCP - General (Internal Medicine)  Indicate any recent Medical Services you may have received from other than [facility] providers in the past year (date may be approximate).     Assessment:   This is a routine wellness examination for [place].  Virtual Visit via Telephone Note  I connected with  [name] on 10/14/21 at  3:30 PM EST by telephone and verified that I  am speaking with the correct person using two identifiers.  Persons participating in the virtual visit: patient/Nurse Health Advisor   I discussed the limitations, risks, security and privacy concerns of performing an evaluation and management service by telephone and the availability of in person appointments. The patient expressed understanding and agreed to proceed.  Interactive audio and video telecommunications were attempted between this nurse and patient, however failed, due to patient having technical difficulties OR patient did not have access to video capability.  We continued and completed visit with audio only.  Some vital signs may be absent or patient reported.   Hearing/Vision screen Hearing Screening - Comments:: Patient is able to hear conversational tones without difficulty.  No issues reported. Vision Screening - Comments:: Followed by [facility] Wears corrective lenses  Cataract extraction, R eye Glaucoma; drops in use R eye  They have seen their ophthalmologist every 3-4 months.   Dietary issues and exercise activities discussed: Current Exercise Habits: Home exercise routine, Type of exercise: strength training/weights, Time (Minutes): 60, Frequency (Times/Week): 7, Weekly Exercise (Minutes/Week): 420, Intensity: Mild Healthy diet Good water intake   Goals Addressed             This  Visit's Progress    Maintain Healthy Lifestyle       Stay active Healthy diet Stay hydrated         Depression Screen PHQ 2/9 Scores 10/14/2021 07/11/2021 09/29/2020 09/26/2019 08/23/2018 08/20/2017 06/21/2017  PHQ - 2 Score 0 0 0 0 0 0 0  PHQ- 9 Score - - - - - 0 -    Fall Risk Fall Risk  10/14/2021 07/11/2021 09/29/2020 09/26/2019 08/26/2018  Falls in the past year? 0 0 0 0 0  Comment - - - - Emmi Telephone Survey: data to providers prior to load  Number falls in past yr: 0 0 0 - -  Injury with Fall? 0 0 0 - -  Follow up Falls evaluation completed Falls evaluation completed Falls evaluation completed Falls prevention discussed -   FALL RISK PREVENTION PERTAINING TO THE HOME: Home free of loose throw rugs in walkways, pet beds, electrical cords, etc? Yes  Adequate lighting in your home to reduce risk of falls? Yes   ASSISTIVE DEVICES UTILIZED TO PREVENT FALLS: Use of a cane, walker or w/c? No   TIMED UP AND GO: Was the test performed? No .   Cognitive Function: Patient is alert and oriented x3.  MMSE - Mini Mental State Exam 08/23/2018 08/20/2017 08/18/2016  Orientation to time 5 5 5   Orientation to Place 5 5 5   Registration 3 3 3   Attention/ Calculation 5 5 5   Recall 3 3 3   Language- name 2 objects 2 2 2   Language- repeat 1 1 1   Language- follow 3 step command 3 3 3   Language- read & follow direction 1 1 1   Write a sentence 1 1 1   Copy design 1 1 1   Total score 30 30 30      6CIT Screen 09/29/2020 09/26/2019 08/18/2016  What Year? 0 points 0 points 0 points  What month? 0 points 0 points 0 points  What time? 0 points 0 points 0 points  Count back from 20 0 points 0 points 0 points  Months in reverse 0 points 0 points 0 points  Repeat phrase 0 points 0 points -  Total Score 0 0 -    Immunizations Immunization History  Administered Date(s) Administered   Fluad Quad(high Dose 65+) 08/27/2020,  07/11/2021   Influenza, High Dose Seasonal PF 06/13/2016, 08/20/2017,  07/31/2018, 08/22/2019   Influenza,inj,Quad PF,6+ Mos 07/20/2015   PFIZER Comirnaty(Gray Top)Covid-19 Tri-Sucrose Vaccine 02/26/2021   PFIZER(Purple Top)SARS-COV-2 Vaccination 10/30/2019, 11/20/2019, 07/05/2020   Pfizer Covid-19 Vaccine Bivalent Booster 49yrs & up 07/01/2021   Pneumococcal Conjugate-13 06/10/2015   Pneumococcal Polysaccharide-23 06/30/2013   Zoster Recombinat (Shingrix) 07/06/2018, 08/15/2021   Zoster, Live 06/30/2013   TDAP status: Due, Education has been provided regarding the importance of this vaccine. Advised may receive this vaccine at local pharmacy or Health Dept. Aware to provide a copy of the vaccination record if obtained from local pharmacy or Health Dept. Verbalized acceptance and understanding. Deferred.   Screening Tests Health Maintenance  Topic Date Due   TETANUS/TDAP  07/11/2022 (Originally 01/25/1960)   Pneumonia Vaccine 71+ Years old  Completed   INFLUENZA VACCINE  Completed   COVID-19 Vaccine  Completed   Hepatitis C Screening  Completed   Zoster Vaccines- Shingrix  Completed   HPV VACCINES  Aged Out   Health Maintenance There are no preventive care reminders to display for this patient.  Lung Cancer Screening: (Low Dose CT Chest recommended if Age 50-80 years, 30 pack-year currently smoking OR have quit w/in 15years.) does not qualify.   Hepatitis C Screening: does not qualify.  Vision Screening: Recommended annual ophthalmology exams for early detection of glaucoma and other disorders of the eye.  Dental Screening: Recommended annual dental exams for proper oral hygiene  Community Resource Referral / Chronic Care Management: CRR required this visit?  No   CCM required this visit?  No      Plan:     I have personally reviewed and noted the following in the patients chart:   Medical and social history Use of alcohol, tobacco or illicit drugs  Current medications and supplements including opioid prescriptions. Patient is not  currently taking opioid prescriptions. Functional ability and status Nutritional status Physical activity Advanced directives List of other physicians Hospitalizations, surgeries, and ER visits in previous 12 months Vitals Screenings to include cognitive, depression, and falls Referrals and appointments  In addition, I have reviewed and discussed with patient certain preventive protocols, quality metrics, and best practice recommendations. A written personalized care plan for preventive services as well as general preventive health recommendations were provided to patient.     Varney Biles, LPN   [id number]

## 2021-10-21 ENCOUNTER — Telehealth: Payer: Self-pay

## 2021-10-21 DIAGNOSIS — N289 Disorder of kidney and ureter, unspecified: Secondary | ICD-10-CM

## 2021-10-21 NOTE — Telephone Encounter (Signed)
Placed orders for bmet for future labs

## 2021-10-24 ENCOUNTER — Other Ambulatory Visit (INDEPENDENT_AMBULATORY_CARE_PROVIDER_SITE_OTHER): Payer: Medicare Other

## 2021-10-24 ENCOUNTER — Other Ambulatory Visit: Payer: Self-pay

## 2021-10-24 DIAGNOSIS — N289 Disorder of kidney and ureter, unspecified: Secondary | ICD-10-CM | POA: Diagnosis not present

## 2021-10-24 DIAGNOSIS — H35351 Cystoid macular degeneration, right eye: Secondary | ICD-10-CM | POA: Diagnosis not present

## 2021-10-24 LAB — BASIC METABOLIC PANEL
BUN: 15 mg/dL (ref 6–23)
CO2: 30 mEq/L (ref 19–32)
Calcium: 9.6 mg/dL (ref 8.4–10.5)
Chloride: 103 mEq/L (ref 96–112)
Creatinine, Ser: 1.35 mg/dL (ref 0.40–1.50)
GFR: 49.85 mL/min — ABNORMAL LOW (ref >60.00)
Glucose, Bld: 84 mg/dL (ref 70–99)
Potassium: 4 mEq/L (ref 3.5–5.1)
Sodium: 139 mEq/L (ref 135–145)

## 2021-10-27 ENCOUNTER — Ambulatory Visit (INDEPENDENT_AMBULATORY_CARE_PROVIDER_SITE_OTHER): Payer: Medicare Other | Admitting: Internal Medicine

## 2021-10-27 ENCOUNTER — Other Ambulatory Visit: Payer: Self-pay

## 2021-10-27 ENCOUNTER — Encounter: Payer: Self-pay | Admitting: Internal Medicine

## 2021-10-27 VITALS — BP 132/82 | HR 88 | Temp 97.8°F | Ht 70.0 in | Wt 158.2 lb

## 2021-10-27 DIAGNOSIS — D72819 Decreased white blood cell count, unspecified: Secondary | ICD-10-CM

## 2021-10-27 DIAGNOSIS — I10 Essential (primary) hypertension: Secondary | ICD-10-CM

## 2021-10-27 DIAGNOSIS — D696 Thrombocytopenia, unspecified: Secondary | ICD-10-CM

## 2021-10-27 DIAGNOSIS — Z86010 Personal history of colonic polyps: Secondary | ICD-10-CM

## 2021-10-27 DIAGNOSIS — K219 Gastro-esophageal reflux disease without esophagitis: Secondary | ICD-10-CM

## 2021-10-27 DIAGNOSIS — E041 Nontoxic single thyroid nodule: Secondary | ICD-10-CM

## 2021-10-27 DIAGNOSIS — J4520 Mild intermittent asthma, uncomplicated: Secondary | ICD-10-CM

## 2021-10-27 DIAGNOSIS — E611 Iron deficiency: Secondary | ICD-10-CM | POA: Diagnosis not present

## 2021-10-27 DIAGNOSIS — R197 Diarrhea, unspecified: Secondary | ICD-10-CM

## 2021-10-27 MED ORDER — AMLODIPINE BESYLATE 10 MG PO TABS: 10.0000 mg | ORAL_TABLET | Freq: Every day | ORAL | 1 refills | Status: DC

## 2021-10-27 NOTE — Patient Instructions (Signed)
Examples of probiotics:  align, culturelle, florastor.

## 2021-10-27 NOTE — Progress Notes (Signed)
Patient ID: Jose Perry, male   DOB: 09-27-1941, 81 y.o.   MRN: [id number]   Subjective:    Patient ID: Jose Perry, male    DOB: [birth year], 81 y.o.   MRN: [id number]  This visit occurred during the SARS-CoV-2 public health emergency.  Safety protocols were in place, including screening questions prior to the visit, additional usage of staff PPE, and extensive cleaning of exam room while observing appropriate contact time as indicated for disinfecting solutions.   Patient here for work in appt.   Chief Complaint  Patient presents with   Medication Problem    Discuss d/c Losartan   .   HPI Work in to follow up blood pressure.  Off losartan. He stopped the medication. We were following kidney function.  He preferred to stop medication.  Brings in recorded blood pressure readings:  140-150/70-80.  Remains elevated.  No chest pain or sob reported.  Saw Dr Honor Junes - follow up thyroid nodules.  Recommended f/u thyroid ultrasound in one year.  Just saw GI.  Recommended continuing oral iron supplements.  Had recommended to hold on colonoscopy/EGD.  He reports persistent loose stool.  Multiple stools per day.  Taking kaopectate.  Helps.  No fever.  No abdominal pain.     Past Medical History:  Diagnosis Date   Asthma    Dyspnea    Elevated blood pressure    Enlarged prostate    GERD (gastroesophageal reflux disease)    Glaucoma    History of adenomatous polyp of colon    Hx of colonic polyps    Hypertension    Leukopenia    has had an extensive w/up.     Liver hemangioma 01/24/2018   Thyroid goiter    Past Surgical History:  Procedure Laterality Date   BACK SURGERY  2002   ruptured disc   CATARACT EXTRACTION W/PHACO Right 06/30/2020   Procedure: CATARACT EXTRACTION PHACO AND INTRAOCULAR LENS PLACEMENT (IOC) RIGHT, Malyugin 7.19 01:30.8 7.9%;  Surgeon: Leandrew Koyanagi, MD;  Location: [place];  Service: Ophthalmology;  Laterality: Right;   COLONOSCOPY W/  POLYPECTOMY  04/24/2005, 07/06/2010, 07/08/2014   COLONOSCOPY WITH PROPOFOL N/A 02/01/2018   Procedure: COLONOSCOPY WITH PROPOFOL;  Surgeon: Manya Silvas, MD;  Location: [facility] ENDOSCOPY;  Service: Endoscopy;  Laterality: N/A;   ESOPHAGOGASTRODUODENOSCOPY  04/24/2005, 07/06/2010,   EYE SURGERY  2009   relieve pressure from glaucoma   THYROID SURGERY  1968   goiter   Family History  Problem Relation Age of Onset   Heart disease Mother    Alcohol abuse Father    Hyperlipidemia Father    Stroke Father    Social History   Socioeconomic History   Marital status: Married    Spouse name: Not on file   Number of children: Not on file   Years of education: Not on file   Highest education level: Not on file  Occupational History   Not on file  Tobacco Use   Smoking status: Never   Smokeless tobacco: Never  Vaping Use   Vaping Use: Never used  Substance and Sexual Activity   Alcohol use: No    Alcohol/week: 0.0 standard drinks   Drug use: No   Sexual activity: Yes  Other Topics Concern   Not on file  Social History Narrative   Not on file   Social Determinants of Health   Financial Resource Strain: Low Risk    Difficulty of Paying Living Expenses: Not hard at all  Food Insecurity: No Food Insecurity   Worried About Charity fundraiser in the Last Year: Never true   Ran Out of Food in the Last Year: Never true  Transportation Needs: No Transportation Needs   Lack of Transportation (Medical): No   Lack of Transportation (Non-Medical): No  Physical Activity: Sufficiently Active   Days of Exercise per Week: 7 days   Minutes of Exercise per Session: 60 min  Stress: No Stress Concern Present   Feeling of Stress : Not at all  Social Connections: Unknown   Frequency of Communication with Friends and Family: Not on file   Frequency of Social Gatherings with Friends and Family: Not on file   Attends Religious Services: Not on file   Active Member of Clubs or Organizations: Not on  file   Attends Archivist Meetings: Not on file   Marital Status: Married     Review of Systems  Constitutional:  Negative for appetite change, fever and unexpected weight change.  HENT:  Negative for congestion and sinus pressure.   Respiratory:  Negative for cough, chest tightness and shortness of breath.   Cardiovascular:  Negative for chest pain, palpitations and leg swelling.  Gastrointestinal:  Positive for diarrhea. Negative for abdominal pain, nausea and vomiting.  Genitourinary:  Negative for difficulty urinating and dysuria.  Musculoskeletal:  Negative for joint swelling and myalgias.  Skin:  Negative for color change and rash.  Neurological:  Negative for dizziness, light-headedness and headaches.  Psychiatric/Behavioral:  Negative for agitation and dysphoric mood.       Objective:     BP 132/82 (BP Location: Left Arm, Patient Position: Sitting, Cuff Size: Normal)    Pulse 88    Temp 97.8 F (36.6 C) (Oral)    Ht 5\' 10"  (1.778 m)    Wt 158 lb 3.2 oz (71.8 kg)    SpO2 97%    BMI 22.70 kg/m  Wt Readings from Last 3 Encounters:  10/27/21 158 lb 3.2 oz (71.8 kg)  10/14/21 159 lb (72.1 kg)  08/11/21 159 lb (72.1 kg)    Physical Exam Constitutional:      General: He is not in acute distress.    Appearance: Normal appearance. He is well-developed.  HENT:     Head: Normocephalic and atraumatic.     Right Ear: External ear normal.     Left Ear: External ear normal.  Eyes:     General: No scleral icterus.       Right eye: No discharge.        Left eye: No discharge.  Cardiovascular:     Rate and Rhythm: Normal rate and regular rhythm.  Pulmonary:     Effort: Pulmonary effort is normal. No respiratory distress.     Breath sounds: Normal breath sounds.  Abdominal:     General: Bowel sounds are normal.     Palpations: Abdomen is soft.     Tenderness: There is no abdominal tenderness.  Musculoskeletal:        General: No swelling or tenderness.      Cervical back: Neck supple. No tenderness.  Lymphadenopathy:     Cervical: No cervical adenopathy.  Skin:    Findings: No erythema or rash.  Neurological:     Mental Status: He is alert.  Psychiatric:        Mood and Affect: Mood normal.        Behavior: Behavior normal.     Outpatient Encounter Medications as of 10/27/2021  Medication Sig   albuterol (VENTOLIN HFA) 108 (90 Base) MCG/ACT inhaler INHALE 2 PUFFS BY MOUTH EVERY 6 HOURS AS NEEDED   amLODipine (NORVASC) 10 MG tablet Take 1 tablet (10 mg total) by mouth daily.   BREO ELLIPTA 100-25 MCG/INH AEPB TAKE 1 PUFF BY MOUTH EVERY DAY   latanoprost (XALATAN) 0.005 % ophthalmic solution ADMINISTER 1 DROP TO THE RIGHT EYE NIGHTLY.   omeprazole (PRILOSEC) 40 MG capsule TAKE 1 CAPSULE (40 MG TOTAL) BY MOUTH DAILY.   tamsulosin (FLOMAX) 0.4 MG CAPS capsule Take 1 capsule (0.4 mg total) by mouth daily.   vitamin B-12 (CYANOCOBALAMIN) 1000 MCG tablet Take by mouth.   [DISCONTINUED] amLODipine (NORVASC) 5 MG tablet TAKE 1 TABLET BY MOUTH EVERY DAY   [DISCONTINUED] ACULAR 0.5 % ophthalmic solution Place 1 drop into the right eye 4 (four) times daily.   [DISCONTINUED] losartan (COZAAR) 50 MG tablet Take 1 tablet (50 mg total) by mouth daily. (Patient not taking: Reported on 10/27/2021)   [DISCONTINUED] prednisoLONE acetate (PRED FORTE) 1 % ophthalmic suspension Place 1 drop into the right eye 4 (four) times daily.   No facility-administered encounter medications on file as of 10/27/2021.     Lab Results  Component Value Date   WBC 2.3 Repeated and verified X2. (L) 10/14/2021   HGB 13.0 10/14/2021   HCT 40.9 10/14/2021   PLT 122.0 (L) 10/14/2021   GLUCOSE 84 10/24/2021   CHOL 164 07/07/2021   TRIG 31.0 07/07/2021   HDL 73.30 07/07/2021   LDLCALC 84 07/07/2021   ALT 16 07/07/2021   AST 25 07/07/2021   NA 139 10/24/2021   K 4.0 10/24/2021   CL 103 10/24/2021   CREATININE 1.35 10/24/2021   BUN 15 10/24/2021   CO2 30 10/24/2021   TSH  2.13 11/09/2020   PSA 2.86 07/11/2021       Assessment & Plan:   Problem List Items Addressed This Visit     Asthma    Breathing stable.  Follow.       Diarrhea - Primary    Persistent loose stool.  Check c.diff.  Probiotics and fiber. Discussed referral back to GI if persistent.        Relevant Orders   C Difficile Quick Screen w PCR reflex   GERD (gastroesophageal reflux disease)    Controlled on prilosec.       History of colonic polyps    Colonoscopy 01/2018.  Just saw GI.       Hypertension    On amlodipine.  Given persistent elevation in blood pressure, increase amlodipine to 10mg  q day.  Follow pressures.  Follow metabolic panel.       Relevant Medications   amLODipine (NORVASC) 10 MG tablet   Iron deficiency    Just saw GI.  Recommended f/u cbc and iron studies.  Recommended holding on EGD and colonoscopy.  Follow cbc and iron studies.       Leukopenia    Has been previously evaluated by hematology.  White blood cell count just checked 2.3.  Follow.        Thrombocytopenia ([place])    Follow cbc.       Thyroid nodule    Just evaluated by endocrinology - 10/2021.  Recommended f/u thyroid ultrasound in one year.          Einar Pheasant, MD

## 2021-10-28 ENCOUNTER — Other Ambulatory Visit
Admission: RE | Admit: 2021-10-28 | Discharge: 2021-10-28 | Disposition: A | Discharge status: Home / Self Care | Payer: Medicare Other | Source: Ambulatory Visit | Attending: Internal Medicine | Admitting: Internal Medicine

## 2021-10-28 ENCOUNTER — Telehealth: Payer: Self-pay | Admitting: Internal Medicine

## 2021-10-28 DIAGNOSIS — R197 Diarrhea, unspecified: Secondary | ICD-10-CM | POA: Insufficient documentation

## 2021-10-28 DIAGNOSIS — H00011 Hordeolum externum right upper eyelid: Secondary | ICD-10-CM | POA: Diagnosis not present

## 2021-10-28 LAB — C DIFFICILE QUICK SCREEN W PCR REFLEX
C Diff antigen: NEGATIVE
C Diff interpretation: NOT DETECTED
C Diff toxin: NEGATIVE

## 2021-10-28 NOTE — Telephone Encounter (Signed)
Pt called and stated that he just spoke with [place] today and he would like to talk to her again.

## 2021-10-30 ENCOUNTER — Encounter: Payer: Self-pay | Admitting: Internal Medicine

## 2021-10-30 DIAGNOSIS — R197 Diarrhea, unspecified: Secondary | ICD-10-CM | POA: Insufficient documentation

## 2021-10-30 NOTE — Assessment & Plan Note (Signed)
Controlled on prilosec.   

## 2021-10-30 NOTE — Assessment & Plan Note (Signed)
Has been previously evaluated by hematology.  White blood cell count just checked 2.3.  Follow.

## 2021-10-30 NOTE — Assessment & Plan Note (Signed)
Breathing stable.  Follow.    

## 2021-10-30 NOTE — Assessment & Plan Note (Signed)
Just saw GI.  Recommended f/u cbc and iron studies.  Recommended holding on EGD and colonoscopy.  Follow cbc and iron studies.

## 2021-10-30 NOTE — Assessment & Plan Note (Signed)
Persistent loose stool.  Check c.diff.  Probiotics and fiber. Discussed referral back to GI if persistent.

## 2021-10-30 NOTE — Assessment & Plan Note (Signed)
Follow cbc.  

## 2021-10-30 NOTE — Assessment & Plan Note (Signed)
Colonoscopy 01/2018.  Just saw GI.

## 2021-10-30 NOTE — Assessment & Plan Note (Signed)
Just evaluated by endocrinology - 10/2021.  Recommended f/u thyroid ultrasound in one year.   

## 2021-10-30 NOTE — Assessment & Plan Note (Signed)
On amlodipine.  Given persistent elevation in blood pressure, increase amlodipine to 10mg  q day.  Follow pressures.  Follow metabolic panel.

## 2021-10-31 NOTE — Telephone Encounter (Signed)
GI referral placed

## 2021-10-31 NOTE — Telephone Encounter (Signed)
Per pt request. Wanted to see [facility] clinic

## 2021-11-11 ENCOUNTER — Encounter: Payer: Medicare Other | Admitting: Internal Medicine

## 2021-11-15 ENCOUNTER — Other Ambulatory Visit: Payer: Medicare Other

## 2021-11-16 ENCOUNTER — Telehealth: Payer: Self-pay | Admitting: Internal Medicine

## 2021-11-16 DIAGNOSIS — E611 Iron deficiency: Secondary | ICD-10-CM

## 2021-11-16 NOTE — Telephone Encounter (Signed)
Patient has a lab appt 11/21/2021, there are no orders in.

## 2021-11-17 ENCOUNTER — Other Ambulatory Visit: Payer: Self-pay | Admitting: Internal Medicine

## 2021-11-17 DIAGNOSIS — I10 Essential (primary) hypertension: Secondary | ICD-10-CM

## 2021-11-17 DIAGNOSIS — D696 Thrombocytopenia, unspecified: Secondary | ICD-10-CM

## 2021-11-17 NOTE — Telephone Encounter (Signed)
Labs ordered.

## 2021-11-17 NOTE — Addendum Note (Signed)
Addended by: Lars Masson on: 11/17/2021 07:14 AM   Modules accepted: Orders

## 2021-11-17 NOTE — Progress Notes (Signed)
Orders placed for f/u labs.  

## 2021-11-17 NOTE — Telephone Encounter (Signed)
Please schedule labs

## 2021-11-21 ENCOUNTER — Other Ambulatory Visit: Payer: Self-pay

## 2021-11-21 ENCOUNTER — Other Ambulatory Visit (INDEPENDENT_AMBULATORY_CARE_PROVIDER_SITE_OTHER): Payer: Medicare Other

## 2021-11-21 DIAGNOSIS — E611 Iron deficiency: Secondary | ICD-10-CM | POA: Diagnosis not present

## 2021-11-21 DIAGNOSIS — D696 Thrombocytopenia, unspecified: Secondary | ICD-10-CM

## 2021-11-21 DIAGNOSIS — I10 Essential (primary) hypertension: Secondary | ICD-10-CM | POA: Diagnosis not present

## 2021-11-21 LAB — CBC WITH DIFFERENTIAL/PLATELET
Basophils Absolute: 0 10*3/uL (ref 0.0–0.1)
Basophils Relative: 1.4 % (ref 0.0–3.0)
Eosinophils Absolute: 0.1 10*3/uL (ref 0.0–0.7)
Eosinophils Relative: 3.4 % (ref 0.0–5.0)
HCT: 42.8 % (ref 39.0–52.0)
Hemoglobin: 13.7 g/dL (ref 13.0–17.0)
Lymphocytes Relative: 34.1 % (ref 12.0–46.0)
Lymphs Abs: 0.8 10*3/uL (ref 0.7–4.0)
MCHC: 32.1 g/dL (ref 30.0–36.0)
MCV: 85.4 fl (ref 78.0–100.0)
Monocytes Absolute: 0.2 10*3/uL (ref 0.1–1.0)
Monocytes Relative: 7.9 % (ref 3.0–12.0)
Neutro Abs: 1.2 10*3/uL — ABNORMAL LOW (ref 1.4–7.7)
Neutrophils Relative %: 53.2 % (ref 43.0–77.0)
Platelets: 137 10*3/uL — ABNORMAL LOW (ref 150.0–400.0)
RBC: 5.01 Mil/uL (ref 4.22–5.81)
RDW: 15.5 % (ref 11.5–15.5)
WBC: 2.3 10*3/uL — ABNORMAL LOW (ref 4.0–10.5)

## 2021-11-22 LAB — BASIC METABOLIC PANEL
BUN: 18 mg/dL (ref 6–23)
CO2: 26 mEq/L (ref 19–32)
Calcium: 9.9 mg/dL (ref 8.4–10.5)
Chloride: 101 mEq/L (ref 96–112)
Creatinine, Ser: 1.41 mg/dL (ref 0.40–1.50)
GFR: 47.29 mL/min — ABNORMAL LOW (ref >60.00)
Glucose, Bld: 85 mg/dL (ref 70–99)
Potassium: 4.7 mEq/L (ref 3.5–5.1)
Sodium: 136 mEq/L (ref 135–145)

## 2021-11-22 LAB — IBC + FERRITIN
Ferritin: 43.8 ng/mL (ref 22.0–322.0)
Iron: 75 ug/dL (ref 42–165)
Saturation Ratios: 19.6 % — ABNORMAL LOW (ref 20.0–50.0)
TIBC: 383.6 ug/dL (ref 250.0–450.0)
Transferrin: 274 mg/dL (ref 212.0–360.0)

## 2021-11-23 ENCOUNTER — Other Ambulatory Visit: Payer: Self-pay | Admitting: Internal Medicine

## 2021-11-23 DIAGNOSIS — R944 Abnormal results of kidney function studies: Secondary | ICD-10-CM

## 2021-11-23 NOTE — Progress Notes (Signed)
Order placed for renal ultrasound.  

## 2021-11-24 ENCOUNTER — Other Ambulatory Visit: Payer: Self-pay

## 2021-11-24 ENCOUNTER — Encounter: Payer: Self-pay | Admitting: Internal Medicine

## 2021-11-24 ENCOUNTER — Ambulatory Visit (INDEPENDENT_AMBULATORY_CARE_PROVIDER_SITE_OTHER): Payer: Medicare Other | Admitting: Internal Medicine

## 2021-11-24 VITALS — BP 140/80 | HR 66 | Temp 97.8°F | Resp 16 | Ht 70.0 in | Wt 153.2 lb

## 2021-11-24 DIAGNOSIS — N401 Enlarged prostate with lower urinary tract symptoms: Secondary | ICD-10-CM

## 2021-11-24 DIAGNOSIS — D696 Thrombocytopenia, unspecified: Secondary | ICD-10-CM | POA: Diagnosis not present

## 2021-11-24 DIAGNOSIS — D72819 Decreased white blood cell count, unspecified: Secondary | ICD-10-CM | POA: Diagnosis not present

## 2021-11-24 DIAGNOSIS — J4520 Mild intermittent asthma, uncomplicated: Secondary | ICD-10-CM

## 2021-11-24 DIAGNOSIS — R972 Elevated prostate specific antigen [PSA]: Secondary | ICD-10-CM

## 2021-11-24 DIAGNOSIS — I10 Essential (primary) hypertension: Secondary | ICD-10-CM

## 2021-11-24 DIAGNOSIS — Z0000 Encounter for general adult medical examination without abnormal findings: Secondary | ICD-10-CM

## 2021-11-24 DIAGNOSIS — Z86010 Personal history of colonic polyps: Secondary | ICD-10-CM

## 2021-11-24 DIAGNOSIS — E611 Iron deficiency: Secondary | ICD-10-CM | POA: Diagnosis not present

## 2021-11-24 DIAGNOSIS — E041 Nontoxic single thyroid nodule: Secondary | ICD-10-CM | POA: Diagnosis not present

## 2021-11-24 DIAGNOSIS — K219 Gastro-esophageal reflux disease without esophagitis: Secondary | ICD-10-CM | POA: Diagnosis not present

## 2021-11-24 MED ORDER — OLMESARTAN MEDOXOMIL 5 MG PO TABS: 5.0000 mg | ORAL_TABLET | Freq: Every day | ORAL | 2 refills | Status: DC

## 2021-11-24 MED ORDER — AMLODIPINE BESYLATE 5 MG PO TABS: 5.0000 mg | ORAL_TABLET | Freq: Every day | ORAL | 1 refills | Status: DC

## 2021-11-24 NOTE — Progress Notes (Addendum)
Patient ID: Jose Perry, male   DOB: 01-19-1941, 81 y.o.   MRN: [id number]   Subjective:    Patient ID: Jose Perry, male    DOB: 30-Sep-1941, 81 y.o.   MRN: [id number]  This visit occurred during the SARS-CoV-2 public health emergency.  Safety protocols were in place, including screening questions prior to the visit, additional usage of staff PPE, and extensive cleaning of exam room while observing appropriate contact time as indicated for disinfecting solutions.   Patient here for his physical exam.   Chief Complaint  Patient presents with   Annual Exam   .   HPI We have been following his kidney function and blood pressure recently.  Last visit, amlodipine was increased to 10mg  q day.  He started having swelling - ankles, so within the last week, he has decreased his amlodipine to 5mg  q day.  Swelling has improved.  No chest pain or sob reported.  No abdominal pain.  Diarrhea has stopped.  Discussed staying hydrated.  Blood pressure 130s/80s.     Past Medical History:  Diagnosis Date   Asthma    Dyspnea    Elevated blood pressure    Enlarged prostate    GERD (gastroesophageal reflux disease)    Glaucoma    History of adenomatous polyp of colon    Hx of colonic polyps    Hypertension    Leukopenia    has had an extensive w/up.     Liver hemangioma 01/24/2018   Thyroid goiter    Past Surgical History:  Procedure Laterality Date   BACK SURGERY  2002   ruptured disc   CATARACT EXTRACTION W/PHACO Right 06/30/2020   Procedure: CATARACT EXTRACTION PHACO AND INTRAOCULAR LENS PLACEMENT (IOC) RIGHT, M[place]ugin 7.19 01:30.8 7.9%;  Surgeon: Leandrew Koyanagi, MD;  Location: [place];  Service: Ophthalmology;  Laterality: Right;   COLONOSCOPY W/ POLYPECTOMY  04/24/2005, 07/06/2010, 07/08/2014   COLONOSCOPY WITH PROPOFOL N/A 02/01/2018   Procedure: COLONOSCOPY WITH PROPOFOL;  Surgeon: Manya Silvas, MD;  Location: [facility] ENDOSCOPY;  Service: Endoscopy;  Laterality: N/A;    ESOPHAGOGASTRODUODENOSCOPY  04/24/2005, 07/06/2010,   EYE SURGERY  2009   relieve pressure from glaucoma   THYROID SURGERY  1968   goiter   Family History  Problem Relation Age of Onset   Heart disease Mother    Alcohol abuse Father    Hyperlipidemia Father    Stroke Father    Social History   Socioeconomic History   Marital status: Married    Spouse name: Not on file   Number of children: Not on file   Years of education: Not on file   Highest education level: Not on file  Occupational History   Not on file  Tobacco Use   Smoking status: Never   Smokeless tobacco: Never  Vaping Use   Vaping Use: Never used  Substance and Sexual Activity   Alcohol use: No    Alcohol/week: 0.0 standard drinks   Drug use: No   Sexual activity: Yes  Other Topics Concern   Not on file  Social History Narrative   Not on file   Social Determinants of Health   Financial Resource Strain: Low Risk    Difficulty of Paying Living Expenses: Not hard at all  Food Insecurity: No Food Insecurity   Worried About Charity fundraiser in the Last Year: Never true   [place] in the Last Year: Never true  Transportation Needs: No Transportation Needs  Lack of Transportation (Medical): No   Lack of Transportation (Non-Medical): No  Physical Activity: Sufficiently Active   Days of Exercise per Week: 7 days   Minutes of Exercise per Session: 60 min  Stress: No Stress Concern Present   Feeling of Stress : Not at all  Social Connections: Unknown   Frequency of Communication with Friends and Family: Not on file   Frequency of Social Gatherings with Friends and Family: Not on file   Attends Religious Services: Not on file   Active Member of Clubs or Organizations: Not on file   Attends Archivist Meetings: Not on file   Marital Status: Married     Review of Systems  Constitutional:  Negative for appetite change and unexpected weight change.  HENT:  Negative for congestion and  sinus pressure.   Respiratory:  Negative for cough, chest tightness and shortness of breath.   Cardiovascular:  Negative for chest pain and palpitations.       Leg swelling better after decreasing amlodipine to 5mg  q day.   Gastrointestinal:  Negative for abdominal pain, diarrhea, nausea and vomiting.  Genitourinary:  Negative for difficulty urinating and dysuria.  Musculoskeletal:  Negative for joint swelling and myalgias.  Skin:  Negative for pallor and wound.  Neurological:  Negative for dizziness, light-headedness and headaches.  Psychiatric/Behavioral:  Negative for agitation and dysphoric mood.       Objective:     BP 140/80    Pulse 66    Temp 97.8 F (36.6 C)    Resp 16    Ht 5\' 10"  (1.778 m)    Wt 153 lb 3.2 oz (69.5 kg)    SpO2 97%    BMI 21.98 kg/m  Wt Readings from Last 3 Encounters:  11/24/21 153 lb 3.2 oz (69.5 kg)  10/27/21 158 lb 3.2 oz (71.8 kg)  10/14/21 159 lb (72.1 kg)    Physical Exam Constitutional:      General: He is not in acute distress.    Appearance: Normal appearance. He is well-developed.  HENT:     Head: Normocephalic and atraumatic.     Right Ear: External ear normal.     Left Ear: External ear normal.  Eyes:     General: No scleral icterus.       Right eye: No discharge.        Left eye: No discharge.  Cardiovascular:     Rate and Rhythm: Normal rate and regular rhythm.  Pulmonary:     Effort: Pulmonary effort is normal. No respiratory distress.     Breath sounds: Normal breath sounds.  Abdominal:     General: Bowel sounds are normal.     Palpations: Abdomen is soft.     Tenderness: There is no abdominal tenderness.  Musculoskeletal:        General: No swelling or tenderness.     Cervical back: Neck supple. No tenderness.  Lymphadenopathy:     Cervical: No cervical adenopathy.  Skin:    Findings: No erythema or rash.  Neurological:     Mental Status: He is alert.  Psychiatric:        Mood and Affect: Mood normal.         Behavior: Behavior normal.     Outpatient Encounter Medications as of 11/24/2021  Medication Sig   amLODipine (NORVASC) 5 MG tablet Take 1 tablet (5 mg total) by mouth daily.   olmesartan (BENICAR) 5 MG tablet Take 1 tablet (5 mg total) by  mouth daily.   albuterol (VENTOLIN HFA) 108 (90 Base) MCG/ACT inhaler INHALE 2 PUFFS BY MOUTH EVERY 6 HOURS AS NEEDED   BREO ELLIPTA 100-25 MCG/INH AEPB TAKE 1 PUFF BY MOUTH EVERY DAY   latanoprost (XALATAN) 0.005 % ophthalmic solution ADMINISTER 1 DROP TO THE RIGHT EYE NIGHTLY.   omeprazole (PRILOSEC) 40 MG capsule TAKE 1 CAPSULE (40 MG TOTAL) BY MOUTH DAILY.   tamsulosin (FLOMAX) 0.4 MG CAPS capsule Take 1 capsule (0.4 mg total) by mouth daily.   vitamin B-12 (CYANOCOBALAMIN) 1000 MCG tablet Take by mouth.   [DISCONTINUED] amLODipine (NORVASC) 10 MG tablet Take 1 tablet (10 mg total) by mouth daily.   No facility-administered encounter medications on file as of 11/24/2021.     Lab Results  Component Value Date   WBC 2.3 Repeated and verified X2. (L) 11/21/2021   HGB 13.7 11/21/2021   HCT 42.8 11/21/2021   PLT 137.0 (L) 11/21/2021   GLUCOSE 85 11/21/2021   CHOL 164 07/07/2021   TRIG 31.0 07/07/2021   HDL 73.30 07/07/2021   LDLCALC 84 07/07/2021   ALT 16 07/07/2021   AST 25 07/07/2021   NA 136 11/21/2021   K 4.7 11/21/2021   CL 101 11/21/2021   CREATININE 1.41 11/21/2021   BUN 18 11/21/2021   CO2 26 11/21/2021   TSH 2.13 11/09/2020   PSA 2.86 07/11/2021       Assessment & Plan:   Problem List Items Addressed This Visit     Asthma    Breathing stable.  Follow.       Elevated prostate specific antigen (PSA)    Has been followed by urology.        GERD (gastroesophageal reflux disease)    Controlled on prilosec.       Health care maintenance    Physical today 11/24/21.  Colonoscopy 01/2018.  Dr Bernardo Heater follows prostate.  PSA 07/11/21 - 2.86.       History of colonic polyps    Colonoscopy 01/2018.  Just saw GI. No diarrhea now.   Follow.       Hyperplasia of prostate with lower urinary tract symptoms (LUTS)    Has been followed by urology.       Hypertension    On amlodipine. Had swelling with 10mg  dose.  Decrease amlodipine to 5mg  q day.  Start benicar 5mg  q day.  May need to increase dose.  Follow pressures.  Follow metabolic panel.       Relevant Medications   amLODipine (NORVASC) 5 MG tablet   olmesartan (BENICAR) 5 MG tablet   Iron deficiency    Just saw GI.  Recommended f/u cbc and iron studies.  Recommended holding on EGD and colonoscopy.  Follow cbc and iron studies.       Leukopenia    Has been previously evaluated by hematology.  White blood cell count just checked 2.3.  Follow.        Thrombocytopenia ([place])    Follow cbc.       Thyroid nodule    Just evaluated by endocrinology - 10/2021.  Recommended f/u thyroid ultrasound in one year.        Other Visit Diagnoses     Essential hypertension    -  Primary   Relevant Medications   amLODipine (NORVASC) 5 MG tablet   olmesartan (BENICAR) 5 MG tablet   Other Relevant Orders   Basic metabolic panel        Einar Pheasant, MD

## 2021-11-24 NOTE — Assessment & Plan Note (Signed)
Physical today 11/24/21.  Colonoscopy 01/2018.  Dr Bernardo Heater follows prostate.  PSA 07/11/21 - 2.86.

## 2021-11-27 ENCOUNTER — Encounter: Payer: Self-pay | Admitting: Internal Medicine

## 2021-11-27 NOTE — Assessment & Plan Note (Signed)
Has been previously evaluated by hematology.  White blood cell count just checked 2.3.  Follow.

## 2021-11-27 NOTE — Assessment & Plan Note (Signed)
Breathing stable.  Follow.    

## 2021-11-27 NOTE — Assessment & Plan Note (Signed)
On amlodipine. Had swelling with 10mg  dose.  Decrease amlodipine to 5mg  q day.  Start benicar 5mg  q day.  May need to increase dose.  Follow pressures.  Follow metabolic panel.

## 2021-11-27 NOTE — Assessment & Plan Note (Signed)
Colonoscopy 01/2018.  Just saw GI. No diarrhea now.  Follow.

## 2021-11-27 NOTE — Assessment & Plan Note (Signed)
Has been followed by urology.   

## 2021-11-27 NOTE — Assessment & Plan Note (Signed)
Follow cbc.  

## 2021-11-27 NOTE — Assessment & Plan Note (Signed)
Just evaluated by endocrinology - 10/2021.  Recommended f/u thyroid ultrasound in one year.   

## 2021-11-27 NOTE — Assessment & Plan Note (Signed)
Just saw GI.  Recommended f/u cbc and iron studies.  Recommended holding on EGD and colonoscopy.  Follow cbc and iron studies.

## 2021-11-27 NOTE — Assessment & Plan Note (Addendum)
Has been followed by urology.   

## 2021-11-27 NOTE — Assessment & Plan Note (Signed)
Controlled on prilosec.   

## 2021-11-28 DIAGNOSIS — H6123 Impacted cerumen, bilateral: Secondary | ICD-10-CM | POA: Diagnosis not present

## 2021-11-28 DIAGNOSIS — K121 Other forms of stomatitis: Secondary | ICD-10-CM | POA: Diagnosis not present

## 2021-12-01 ENCOUNTER — Other Ambulatory Visit: Payer: Self-pay

## 2021-12-01 ENCOUNTER — Ambulatory Visit
Admission: RE | Admit: 2021-12-01 | Discharge: 2021-12-01 | Disposition: A | Discharge status: Home / Self Care | Payer: Medicare Other | Source: Ambulatory Visit | Attending: Internal Medicine | Admitting: Internal Medicine

## 2021-12-01 DIAGNOSIS — N281 Cyst of kidney, acquired: Secondary | ICD-10-CM | POA: Diagnosis not present

## 2021-12-01 DIAGNOSIS — R944 Abnormal results of kidney function studies: Secondary | ICD-10-CM | POA: Diagnosis not present

## 2021-12-01 DIAGNOSIS — N400 Enlarged prostate without lower urinary tract symptoms: Secondary | ICD-10-CM | POA: Diagnosis not present

## 2021-12-06 DIAGNOSIS — D1803 Hemangioma of intra-abdominal structures: Secondary | ICD-10-CM | POA: Diagnosis not present

## 2021-12-06 DIAGNOSIS — E611 Iron deficiency: Secondary | ICD-10-CM | POA: Diagnosis not present

## 2021-12-06 DIAGNOSIS — R197 Diarrhea, unspecified: Secondary | ICD-10-CM | POA: Diagnosis not present

## 2021-12-06 DIAGNOSIS — K219 Gastro-esophageal reflux disease without esophagitis: Secondary | ICD-10-CM | POA: Diagnosis not present

## 2021-12-13 ENCOUNTER — Other Ambulatory Visit: Payer: Self-pay

## 2021-12-13 ENCOUNTER — Other Ambulatory Visit (INDEPENDENT_AMBULATORY_CARE_PROVIDER_SITE_OTHER): Payer: Medicare Other

## 2021-12-13 DIAGNOSIS — I10 Essential (primary) hypertension: Secondary | ICD-10-CM | POA: Diagnosis not present

## 2021-12-13 LAB — BASIC METABOLIC PANEL
BUN: 15 mg/dL (ref 6–23)
CO2: 30 mEq/L (ref 19–32)
Calcium: 9.7 mg/dL (ref 8.4–10.5)
Chloride: 101 mEq/L (ref 96–112)
Creatinine, Ser: 1.24 mg/dL (ref 0.40–1.50)
GFR: 55.15 mL/min — ABNORMAL LOW (ref >60.00)
Glucose, Bld: 68 mg/dL — ABNORMAL LOW (ref 70–99)
Potassium: 4.1 mEq/L (ref 3.5–5.1)
Sodium: 137 mEq/L (ref 135–145)

## 2021-12-15 ENCOUNTER — Telehealth: Payer: Self-pay

## 2021-12-15 NOTE — Telephone Encounter (Signed)
-----   Message from Einar Pheasant, MD sent at 12/14/2021 11:33 AM EST ----- ?Keep me posted on how blood pressures are running.  May need to adjust medication if persistent elevation.  ?

## 2021-12-19 NOTE — Telephone Encounter (Signed)
Please confirm he is currently on '10mg'$  amlodipine q day.  Blood pressure remaining a little elevated in the am.  Please confirm when he is taking his medication.  Can take in pm if not taking then.  Follow pressures and send in.  We will probably need to add a medication to the amlodipine for optimal blood pressure control.  ?

## 2021-12-20 DIAGNOSIS — M1711 Unilateral primary osteoarthritis, right knee: Secondary | ICD-10-CM | POA: Diagnosis not present

## 2021-12-21 DIAGNOSIS — M1711 Unilateral primary osteoarthritis, right knee: Secondary | ICD-10-CM | POA: Diagnosis not present

## 2021-12-29 NOTE — Telephone Encounter (Signed)
S/w patient, he is taking '5mg'$  Amlodipine. ?Pt states his blood pressures have been around 130/70 the past week. ?Pt wants to try taking med at night and see if that makes a difference. ?Will send in b/p readings after making the switch and if the switch to pm does ?Not help the readings, pt is agreeable to starting '10mg'$ . ?

## 2022-01-19 ENCOUNTER — Ambulatory Visit: Payer: Medicare Other | Admitting: Internal Medicine

## 2022-01-23 ENCOUNTER — Encounter: Payer: Self-pay | Admitting: Internal Medicine

## 2022-01-23 ENCOUNTER — Ambulatory Visit (INDEPENDENT_AMBULATORY_CARE_PROVIDER_SITE_OTHER): Payer: Medicare Other | Admitting: Internal Medicine

## 2022-01-23 DIAGNOSIS — D72819 Decreased white blood cell count, unspecified: Secondary | ICD-10-CM

## 2022-01-23 DIAGNOSIS — E041 Nontoxic single thyroid nodule: Secondary | ICD-10-CM

## 2022-01-23 DIAGNOSIS — Z86010 Personal history of colonic polyps: Secondary | ICD-10-CM | POA: Diagnosis not present

## 2022-01-23 DIAGNOSIS — D696 Thrombocytopenia, unspecified: Secondary | ICD-10-CM | POA: Diagnosis not present

## 2022-01-23 DIAGNOSIS — I10 Essential (primary) hypertension: Secondary | ICD-10-CM

## 2022-01-23 DIAGNOSIS — K219 Gastro-esophageal reflux disease without esophagitis: Secondary | ICD-10-CM | POA: Diagnosis not present

## 2022-01-23 DIAGNOSIS — J4520 Mild intermittent asthma, uncomplicated: Secondary | ICD-10-CM

## 2022-01-23 DIAGNOSIS — D1803 Hemangioma of intra-abdominal structures: Secondary | ICD-10-CM

## 2022-01-23 NOTE — Progress Notes (Signed)
Patient ID: Jose Perry, male   DOB: [birth year], 81 y.o.   MRN: [id number] ? ? ?Subjective:  ? ? Patient ID: Jose Perry, male    DOB: November 17, 1940, 81 y.o.   MRN: [id number] ? ?This visit occurred during the SARS-CoV-2 public health emergency.  Safety protocols were in place, including screening questions prior to the visit, additional usage of staff PPE, and extensive cleaning of exam room while observing appropriate contact time as indicated for disinfecting solutions.  ? ?Patient here for a scheduled follow up.  ? ?Chief Complaint  ?Patient presents with  ? Follow-up  ?  Follow up for HTN   ? .  ? ?HPI ?States he is doing well.  Feels good.  Stays active.  No chest pain or sob reported.  No abdominal pain.  Saw GI for diarrhea.  Started benefiber and [place].  Diarrhea not an issue now.  Eating.  No nausea or vomiting.  Blood pressure ok.   ? ? ?Past Medical History:  ?Diagnosis Date  ? Asthma   ? Dyspnea   ? Elevated blood pressure   ? Enlarged prostate   ? GERD (gastroesophageal reflux disease)   ? Glaucoma   ? History of adenomatous polyp of colon   ? Hx of colonic polyps   ? Hypertension   ? Leukopenia   ? has had an extensive w/up.    ? Liver hemangioma 01/24/2018  ? Thyroid goiter   ? ?Past Surgical History:  ?Procedure Laterality Date  ? BACK SURGERY  2002  ? ruptured disc  ? CATARACT EXTRACTION W/PHACO Right 06/30/2020  ? Procedure: CATARACT EXTRACTION PHACO AND INTRAOCULAR LENS PLACEMENT (IOC) RIGHT, [place] 7.19 01:30.8 7.9%;  Surgeon: Leandrew Koyanagi, MD;  Location: [place];  Service: Ophthalmology;  Laterality: Right;  ? COLONOSCOPY W/ POLYPECTOMY  04/24/2005, 07/06/2010, 07/08/2014  ? COLONOSCOPY WITH PROPOFOL N/A 02/01/2018  ? Procedure: COLONOSCOPY WITH PROPOFOL;  Surgeon: Manya Silvas, MD;  Location: [facility] ENDOSCOPY;  Service: Endoscopy;  Laterality: N/A;  ? ESOPHAGOGASTRODUODENOSCOPY  04/24/2005, 07/06/2010,  ? EYE SURGERY  2009  ? relieve pressure from glaucoma  ? THYROID SURGERY   1968  ? goiter  ? ?Family History  ?Problem Relation Age of Onset  ? Heart disease Mother   ? Alcohol abuse Father   ? Hyperlipidemia Father   ? Stroke Father   ? ?Social History  ? ?Socioeconomic History  ? Marital status: Married  ?  Spouse name: Not on file  ? Number of children: Not on file  ? Years of education: Not on file  ? Highest education level: Not on file  ?Occupational History  ? Not on file  ?Tobacco Use  ? Smoking status: Never  ? Smokeless tobacco: Never  ?Vaping Use  ? Vaping Use: Never used  ?Substance and Sexual Activity  ? Alcohol use: No  ?  Alcohol/week: 0.0 standard drinks  ? Drug use: No  ? Sexual activity: Yes  ?Other Topics Concern  ? Not on file  ?Social History Narrative  ? Not on file  ? ?Social Determinants of Health  ? ?Financial Resource Strain: Low Risk   ? Difficulty of Paying Living Expenses: Not hard at all  ?Food Insecurity: No Food Insecurity  ? Worried About Charity fundraiser in the Last Year: Never true  ? Ran Out of Food in the Last Year: Never true  ?Transportation Needs: No Transportation Needs  ? Lack of Transportation (Medical): No  ? Lack of Transportation (  Non-Medical): No  ?Physical Activity: Sufficiently Active  ? Days of Exercise per Week: 7 days  ? Minutes of Exercise per Session: 60 min  ?Stress: No Stress Concern Present  ? Feeling of Stress : Not at all  ?Social Connections: Unknown  ? Frequency of Communication with Friends and Family: Not on file  ? Frequency of Social Gatherings with Friends and Family: Not on file  ? Attends Religious Services: Not on file  ? Active Member of Clubs or Organizations: Not on file  ? Attends Archivist Meetings: Not on file  ? Marital Status: Married  ? ? ? ?Review of Systems  ?Constitutional:  Negative for appetite change and unexpected weight change.  ?HENT:  Negative for congestion and sinus pressure.   ?Respiratory:  Negative for cough, chest tightness and shortness of breath.   ?Cardiovascular:  Negative for  chest pain, palpitations and leg swelling.  ?Gastrointestinal:  Negative for abdominal pain, diarrhea, nausea and vomiting.  ?Genitourinary:  Negative for difficulty urinating and dysuria.  ?Musculoskeletal:  Negative for joint swelling and myalgias.  ?Skin:  Negative for color change and rash.  ?Neurological:  Negative for dizziness, light-headedness and headaches.  ?Psychiatric/Behavioral:  Negative for agitation and dysphoric mood.   ? ?   ?Objective:  ?  ? ?BP 136/70 (BP Location: Left Arm, Patient Position: Sitting, Cuff Size: Small)   Pulse 67   Temp 97.6 ?F (36.4 ?C) (Temporal)   Resp 14   Ht '5\' 10"'$  (1.778 m)   Wt 156 lb (70.8 kg)   SpO2 100%   BMI 22.38 kg/m?  ?Wt Readings from Last 3 Encounters:  ?01/23/22 156 lb (70.8 kg)  ?11/24/21 153 lb 3.2 oz (69.5 kg)  ?10/27/21 158 lb 3.2 oz (71.8 kg)  ? ? ?Physical Exam ?Constitutional:   ?   General: He is not in acute distress. ?   Appearance: Normal appearance. He is well-developed.  ?HENT:  ?   Head: Normocephalic and atraumatic.  ?   Right Ear: External ear normal.  ?   Left Ear: External ear normal.  ?Eyes:  ?   General: No scleral icterus.    ?   Right eye: No discharge.     ?   Left eye: No discharge.  ?Cardiovascular:  ?   Rate and Rhythm: Normal rate and regular rhythm.  ?Pulmonary:  ?   Effort: Pulmonary effort is normal. No respiratory distress.  ?   Breath sounds: Normal breath sounds.  ?Abdominal:  ?   General: Bowel sounds are normal.  ?   Palpations: Abdomen is soft.  ?   Tenderness: There is no abdominal tenderness.  ?Musculoskeletal:     ?   General: No swelling or tenderness.  ?   Cervical back: Neck supple. No tenderness.  ?Lymphadenopathy:  ?   Cervical: No cervical adenopathy.  ?Skin: ?   Findings: No erythema or rash.  ?Neurological:  ?   Mental Status: He is alert.  ?Psychiatric:     ?   Mood and Affect: Mood normal.     ?   Behavior: Behavior normal.  ? ? ? ?Outpatient Encounter Medications as of 01/23/2022  ?Medication Sig  ?  albuterol (VENTOLIN HFA) 108 (90 Base) MCG/ACT inhaler INHALE 2 PUFFS BY MOUTH EVERY 6 HOURS AS NEEDED  ? amLODipine (NORVASC) 5 MG tablet Take 1 tablet (5 mg total) by mouth daily.  ? BREO ELLIPTA 100-25 MCG/INH AEPB TAKE 1 PUFF BY MOUTH EVERY DAY  ?  latanoprost (XALATAN) 0.005 % ophthalmic solution ADMINISTER 1 DROP TO THE RIGHT EYE NIGHTLY.  ? olmesartan (BENICAR) 5 MG tablet Take 1 tablet (5 mg total) by mouth daily.  ? omeprazole (PRILOSEC) 40 MG capsule TAKE 1 CAPSULE (40 MG TOTAL) BY MOUTH DAILY.  ? tamsulosin (FLOMAX) 0.4 MG CAPS capsule Take 1 capsule (0.4 mg total) by mouth daily.  ? vitamin B-12 (CYANOCOBALAMIN) 1000 MCG tablet Take by mouth.  ? [DISCONTINUED] olmesartan (BENICAR) 5 MG tablet Take 1 tablet (5 mg total) by mouth daily.  ? ?No facility-administered encounter medications on file as of 01/23/2022.  ?  ? ?Lab Results  ?Component Value Date  ? WBC 2.3 Repeated and verified X2. (L) 11/21/2021  ? HGB 13.7 11/21/2021  ? HCT 42.8 11/21/2021  ? PLT 137.0 (L) 11/21/2021  ? GLUCOSE 68 (L) 12/13/2021  ? CHOL 164 07/07/2021  ? TRIG 31.0 07/07/2021  ? HDL 73.30 07/07/2021  ? [place] 84 07/07/2021  ? ALT 16 07/07/2021  ? AST 25 07/07/2021  ? NA 137 12/13/2021  ? K 4.1 12/13/2021  ? CL 101 12/13/2021  ? CREATININE 1.24 12/13/2021  ? BUN 15 12/13/2021  ? CO2 30 12/13/2021  ? TSH 2.13 11/09/2020  ? PSA 2.86 07/11/2021  ? ? ?US Renal ? ?Result Date: 12/03/2021 ?CLINICAL DATA:  Decreased GFR. EXAM: RENAL / URINARY TRACT ULTRASOUND COMPLETE COMPARISON:  Abdominal CT 07/06/2017 FINDINGS: Right Kidney: Renal measurements: 10.1 x 3.3 x 5.3 cm = volume: 93 mL. Mild increased renal parenchymal echogenicity. 3.1 cm simple cyst in the lower pole. No visualized solid lesion. No stone. No hydronephrosis. Left Kidney: Renal measurements: 10.6 x 5.8 x 3.8 cm = volume: 121 mL. Mild increased renal parenchymal echogenicity. No focal lesion or stone. No hydronephrosis. Bladder: Appears normal for degree of bladder distention.  Both ureteral jets are seen. Other: Enlarged prostate gland shortness 5.4 x 4.6 x 4.8 cm. IMPRESSION: 1. Mild increased renal parenchymal echogenicity consistent with chronic medical renal disease. 2. Simple right r

## 2022-01-26 ENCOUNTER — Other Ambulatory Visit: Payer: Self-pay | Admitting: Internal Medicine

## 2022-01-29 ENCOUNTER — Encounter: Payer: Self-pay | Admitting: Internal Medicine

## 2022-01-29 MED ORDER — OLMESARTAN MEDOXOMIL 5 MG PO TABS: 5.0000 mg | ORAL_TABLET | Freq: Every day | ORAL | 2 refills | Status: DC

## 2022-01-29 NOTE — Assessment & Plan Note (Signed)
Controlled on prilosec.   

## 2022-01-29 NOTE — Assessment & Plan Note (Signed)
Has been previously evaluated by hematology.  White blood cell count just checked 2.3.  Follow.   ?

## 2022-01-29 NOTE — Assessment & Plan Note (Signed)
Just saw GI 12/2021.  Felt to be benign.  Recommended no surveillance f/u.  

## 2022-01-29 NOTE — Assessment & Plan Note (Signed)
Just evaluated by endocrinology - 10/2021.  Recommended f/u thyroid ultrasound in one year.   

## 2022-01-29 NOTE — Assessment & Plan Note (Signed)
Follow cbc.  

## 2022-01-29 NOTE — Assessment & Plan Note (Signed)
Colonoscopy 01/2018.  Just saw GI. No diarrhea now.  Follow.  ?

## 2022-01-29 NOTE — Assessment & Plan Note (Signed)
Breathing stable.  Follow.    

## 2022-01-29 NOTE — Assessment & Plan Note (Signed)
On amlodipine. Had swelling with '10mg'$  dose.  On '5mg'$  q day now.   On benicar '5mg'$  q day.   Follow pressures.  Follow metabolic panel.  ?

## 2022-02-20 ENCOUNTER — Other Ambulatory Visit (INDEPENDENT_AMBULATORY_CARE_PROVIDER_SITE_OTHER): Payer: Medicare Other

## 2022-02-20 DIAGNOSIS — E041 Nontoxic single thyroid nodule: Secondary | ICD-10-CM | POA: Diagnosis not present

## 2022-02-20 DIAGNOSIS — D696 Thrombocytopenia, unspecified: Secondary | ICD-10-CM | POA: Diagnosis not present

## 2022-02-20 DIAGNOSIS — D1803 Hemangioma of intra-abdominal structures: Secondary | ICD-10-CM | POA: Diagnosis not present

## 2022-02-20 DIAGNOSIS — I10 Essential (primary) hypertension: Secondary | ICD-10-CM | POA: Diagnosis not present

## 2022-02-20 LAB — BASIC METABOLIC PANEL
BUN: 19 mg/dL (ref 6–23)
CO2: 29 mEq/L (ref 19–32)
Calcium: 9.9 mg/dL (ref 8.4–10.5)
Chloride: 101 mEq/L (ref 96–112)
Creatinine, Ser: 1.32 mg/dL (ref 0.40–1.50)
GFR: 51.1 mL/min — ABNORMAL LOW (ref >60.00)
Glucose, Bld: 82 mg/dL (ref 70–99)
Potassium: 4.2 mEq/L (ref 3.5–5.1)
Sodium: 138 mEq/L (ref 135–145)

## 2022-02-20 LAB — CBC WITH DIFFERENTIAL/PLATELET
Basophils Absolute: 0 10*3/uL (ref 0.0–0.1)
Basophils Relative: 1.5 % (ref 0.0–3.0)
Eosinophils Absolute: 0.1 10*3/uL (ref 0.0–0.7)
Eosinophils Relative: 4.9 % (ref 0.0–5.0)
HCT: 40.6 % (ref 39.0–52.0)
Hemoglobin: 13.2 g/dL (ref 13.0–17.0)
Lymphocytes Relative: 37.8 % (ref 12.0–46.0)
Lymphs Abs: 0.9 10*3/uL (ref 0.7–4.0)
MCHC: 32.6 g/dL (ref 30.0–36.0)
MCV: 85.3 fl (ref 78.0–100.0)
Monocytes Absolute: 0.2 10*3/uL (ref 0.1–1.0)
Monocytes Relative: 9.7 % (ref 3.0–12.0)
Neutro Abs: 1.1 10*3/uL — ABNORMAL LOW (ref 1.4–7.7)
Neutrophils Relative %: 46.1 % (ref 43.0–77.0)
Platelets: 129 10*3/uL — ABNORMAL LOW (ref 150.0–400.0)
RBC: 4.76 Mil/uL (ref 4.22–5.81)
RDW: 14.6 % (ref 11.5–15.5)
WBC: 2.5 10*3/uL — ABNORMAL LOW (ref 4.0–10.5)

## 2022-02-20 LAB — LIPID PANEL
Cholesterol: 181 mg/dL (ref 0–200)
HDL: 73.7 mg/dL (ref >39.00)
LDL Cholesterol: 100 mg/dL — ABNORMAL HIGH (ref 0–99)
NonHDL: 107.32
Total CHOL/HDL Ratio: 2
Triglycerides: 37 mg/dL (ref 0.0–149.0)
VLDL: 7.4 mg/dL (ref 0.0–40.0)

## 2022-02-20 LAB — TSH: TSH: 1.7 u[IU]/mL (ref 0.35–5.50)

## 2022-02-20 LAB — HEPATIC FUNCTION PANEL
ALT: 27 U/L (ref 0–53)
AST: 37 U/L (ref 0–37)
Albumin: 4.4 g/dL (ref 3.5–5.2)
Alkaline Phosphatase: 60 U/L (ref 39–117)
Bilirubin, Direct: 0.2 mg/dL (ref 0.0–0.3)
Total Bilirubin: 0.7 mg/dL (ref 0.2–1.2)
Total Protein: 6.6 g/dL (ref 6.0–8.3)

## 2022-03-01 ENCOUNTER — Other Ambulatory Visit: Payer: Self-pay

## 2022-03-01 DIAGNOSIS — E785 Hyperlipidemia, unspecified: Secondary | ICD-10-CM

## 2022-03-01 MED ORDER — ROSUVASTATIN CALCIUM 5 MG PO TABS: 5.0000 mg | ORAL_TABLET | Freq: Every day | ORAL | 3 refills | Status: DC

## 2022-03-01 NOTE — Addendum Note (Signed)
Addended by: Leeanne Rio on: 03/01/2022 02:09 PM   Modules accepted: Orders

## 2022-03-09 DIAGNOSIS — H401123 Primary open-angle glaucoma, left eye, severe stage: Secondary | ICD-10-CM | POA: Diagnosis not present

## 2022-03-09 DIAGNOSIS — H401112 Primary open-angle glaucoma, right eye, moderate stage: Secondary | ICD-10-CM | POA: Diagnosis not present

## 2022-03-15 ENCOUNTER — Emergency Department
Admission: EM | Admit: 2022-03-15 | Discharge: 2022-03-15 | Disposition: A | Discharge status: Home / Self Care | Payer: Medicare Other | Attending: Emergency Medicine | Admitting: Emergency Medicine

## 2022-03-15 ENCOUNTER — Emergency Department: Payer: Medicare Other

## 2022-03-15 ENCOUNTER — Encounter: Payer: Self-pay | Admitting: Emergency Medicine

## 2022-03-15 ENCOUNTER — Other Ambulatory Visit: Payer: Self-pay

## 2022-03-15 DIAGNOSIS — R0602 Shortness of breath: Secondary | ICD-10-CM | POA: Insufficient documentation

## 2022-03-15 DIAGNOSIS — I10 Essential (primary) hypertension: Secondary | ICD-10-CM | POA: Diagnosis not present

## 2022-03-15 DIAGNOSIS — R079 Chest pain, unspecified: Secondary | ICD-10-CM | POA: Diagnosis not present

## 2022-03-15 DIAGNOSIS — J45909 Unspecified asthma, uncomplicated: Secondary | ICD-10-CM | POA: Insufficient documentation

## 2022-03-15 DIAGNOSIS — R001 Bradycardia, unspecified: Secondary | ICD-10-CM | POA: Diagnosis not present

## 2022-03-15 DIAGNOSIS — R0789 Other chest pain: Secondary | ICD-10-CM | POA: Diagnosis not present

## 2022-03-15 DIAGNOSIS — Z8505 Personal history of malignant neoplasm of liver: Secondary | ICD-10-CM | POA: Diagnosis not present

## 2022-03-15 LAB — CBC
HCT: 43.2 % (ref 39.0–52.0)
Hemoglobin: 13.5 g/dL (ref 13.0–17.0)
MCH: 27.2 pg (ref 26.0–34.0)
MCHC: 31.3 g/dL (ref 30.0–36.0)
MCV: 87.1 fL (ref 80.0–100.0)
Platelets: 142 10*3/uL — ABNORMAL LOW (ref 150–400)
RBC: 4.96 MIL/uL (ref 4.22–5.81)
RDW: 13.7 % (ref 11.5–15.5)
WBC: 4 10*3/uL (ref 4.0–10.5)
nRBC: 0 % (ref 0.0–0.2)

## 2022-03-15 LAB — BASIC METABOLIC PANEL
Anion gap: 5 (ref 5–15)
BUN: 15 mg/dL (ref 8–23)
CO2: 27 mmol/L (ref 22–32)
Calcium: 9.4 mg/dL (ref 8.9–10.3)
Chloride: 105 mmol/L (ref 98–111)
Creatinine, Ser: 1.3 mg/dL — ABNORMAL HIGH (ref 0.61–1.24)
GFR, Estimated: 56 mL/min — ABNORMAL LOW (ref >60)
Glucose, Bld: 97 mg/dL (ref 70–99)
Potassium: 4.1 mmol/L (ref 3.5–5.1)
Sodium: 137 mmol/L (ref 135–145)

## 2022-03-15 LAB — TROPONIN I (HIGH SENSITIVITY)
Troponin I (High Sensitivity): 6 ng/L (ref <18)
Troponin I (High Sensitivity): 6 ng/L (ref <18)

## 2022-03-15 LAB — D-DIMER, QUANTITATIVE: D-Dimer, Quant: 0.63 ug/mL-FEU — ABNORMAL HIGH (ref 0.00–0.50)

## 2022-03-15 MED ORDER — ALUM & MAG HYDROXIDE-SIMETH 200-200-20 MG/5ML PO SUSP
30.0000 mL | Freq: Once | ORAL | Status: AC
  Administered 2022-03-15: 30 mL via ORAL
  Filled 2022-03-15: qty 30

## 2022-03-15 MED ORDER — SUCRALFATE 1 G PO TABS
1.0000 g | ORAL_TABLET | Freq: Once | ORAL | Status: AC
  Administered 2022-03-15: 1 g via ORAL
  Filled 2022-03-15: qty 1

## 2022-03-15 MED ORDER — IPRATROPIUM-ALBUTEROL 0.5-2.5 (3) MG/3ML IN SOLN
3.0000 mL | Freq: Once | RESPIRATORY_TRACT | Status: AC
  Administered 2022-03-15: 3 mL via RESPIRATORY_TRACT
  Filled 2022-03-15: qty 3

## 2022-03-15 MED ORDER — PANTOPRAZOLE SODIUM 40 MG PO TBEC
40.0000 mg | DELAYED_RELEASE_TABLET | Freq: Once | ORAL | Status: AC
  Administered 2022-03-15: 40 mg via ORAL

## 2022-03-15 MED ORDER — SUCRALFATE 1 G PO TABS: 1.0000 g | ORAL_TABLET | Freq: Four times a day (QID) | ORAL | 0 refills | Status: DC

## 2022-03-15 NOTE — ED Notes (Signed)
E-signature pad unavailable - Pt verbalized understanding of D/C information - no additional concerns at this time.  

## 2022-03-15 NOTE — ED Triage Notes (Signed)
Pt to triage via w/c with no distress noted; st left sided CP, nonradiating with no accomp symptoms for several days; st hx acid reflux

## 2022-03-15 NOTE — ED Provider Notes (Signed)
[facility] Provider Note    Event Date/Time   First MD Initiated Contact with Patient 03/15/22 [phone]     (approximate)   History   Chest Pain   HPI  Jose Perry is a 81 y.o. male with past medical history of asthma, HTN, GERD, colonic polyps, HTN, leukopenia, liver hemangioma and goiter who presents for evaluation of shortness of breath and chest pain.  Patient states he has been on and off over the last 3 to 4 days worsened by exertion.  He denies any cough, fever, back pain, headache and earache, sore throat, abdominal pain, vomiting, diarrhea, nausea, urinary symptoms or any focal extremity weakness numbness or tingling.  He denies any history of tobacco use EtOH use or illicit drug use.  States he is on Breo and albuterol inhalers for his asthma has been taking these and feels they have helped his shortness of breath a little bit.  He states he is also on omeprazole for his reflux and is not sure if this is helped much at all as he feels like his chest tightness was worse today than usual.  He states that since coming the emergency room for shortness of breath and chest tightness have improved.  No prior similar episodes.  No other clear alleviating or aggravating factors.  States he had some greens and hamburger with nothing on it last night with no other acidic foods he can think of.    Past Medical History:  Diagnosis Date   Asthma    Dyspnea    Elevated blood pressure    Enlarged prostate    GERD (gastroesophageal reflux disease)    Glaucoma    History of adenomatous polyp of colon    Hx of colonic polyps    Hypertension    Leukopenia    has had an extensive w/up.     Liver hemangioma 01/24/2018   Thyroid goiter      Physical Exam  Triage Vital Signs: ED Triage Vitals  Enc Vitals Group     BP 03/15/22 0118 (!) 155/83     Pulse Rate 03/15/22 0118 (!) 59     Resp 03/15/22 0118 18     Temp 03/15/22 0118 98.1 F (36.7 C)     Temp Source  03/15/22 0118 Oral     SpO2 03/15/22 0118 100 %     Weight 03/15/22 0120 155 lb (70.3 kg)     Height 03/15/22 0120 '5\' 10"'$  (1.778 m)     Head Circumference --      Peak Flow --      Pain Score 03/15/22 0120 7     Pain Loc --      Pain Edu? --      Excl. in [place]? --     Most recent vital signs: Vitals:   03/15/22 0331 03/15/22 0504  BP: 135/76 128/70  Pulse: (!) 55 62  Resp: 16 18  Temp: 97.9 F (36.6 C)   SpO2: 98% 98%    General: Awake, no distress.  CV:  Good peripheral perfusion.  2+ radial pulses.  Bradycardic.  No significant murmur. Resp:  Normal effort.  Some end expiratory wheezing without increased accessory muscle use rhonchi or rales. Abd:  No distention.  Soft throughout. Other:  No significant lower extreme edema.  Chest pain is not reproducible and there is no rash of the chest.   ED Results / Procedures / Treatments  Labs (all labs ordered are listed, but  only abnormal results are displayed) Labs Reviewed  CBC - Abnormal; Notable for the following components:      Result Value   Platelets 142 (*)    All other components within normal limits  BASIC METABOLIC PANEL - Abnormal; Notable for the following components:   Creatinine, Ser 1.30 (*)    GFR, Estimated 56 (*)    All other components within normal limits  D-DIMER, QUANTITATIVE - Abnormal; Notable for the following components:   D-Dimer, Quant 0.63 (*)    All other components within normal limits  TROPONIN I (HIGH SENSITIVITY)  TROPONIN I (HIGH SENSITIVITY)     EKG  EKG is remarkable for sinus bradycardia with a ventricular rate of 57, normal axis, unremarkable intervals with nonspecific change versus artifact in V3 and T wave inversion in aVL without other clear evidence of acute ischemia or significant arrhythmia.   RADIOLOGY  Chest x-ray my interpretation without evidence of focal consolidation, effusion, edema, pneumothorax or other clear acute process.  I also reviewed radiologist dictation  and agree to findings of nodular opacity  representing likely nipple shadow without any other acute thoracic process noted.  PROCEDURES:  Critical Care performed: No  Procedures    MEDICATIONS ORDERED IN ED: Medications  pantoprazole (PROTONIX) EC tablet 40 mg (has no administration in time range)  ipratropium-albuterol (DUONEB) 0.5-2.5 (3) MG/3ML nebulizer solution 3 mL (3 mLs Nebulization Given 03/15/22 0507)  alum & mag hydroxide-simeth (MAALOX/MYLANTA) 200-200-20 MG/5ML suspension 30 mL (30 mLs Oral Given 03/15/22 0505)  sucralfate (CARAFATE) tablet 1 g (1 g Oral Given 03/15/22 0505)     IMPRESSION / MDM / ASSESSMENT AND PLAN / ED COURSE  I reviewed the triage vital signs and the nursing notes. Patient's presentation is most consistent with acute presentation with potential threat to life or bodily function.                               Differential diagnosis includes, but is not limited to, asthma exacerbation, reflux, ACS, arrhythmia, PE, pneumonia with overall lower suspicion for dissection given description of symptoms and exam.  EKG is remarkable for sinus bradycardia with a ventricular rate of 57, normal axis, unremarkable intervals with nonspecific change versus artifact in V3 and T wave inversion in aVL without other clear evidence of acute ischemia or significant arrhythmia.  Troponin x2 is nonelevated not suggestive of an acute ACS event or myocarditis.  D-dimer 0.63 is within age-adjusted limits and overall have a lower suspicion for PE at this time.  Chest x-ray my interpretation without evidence of focal consolidation, effusion, edema, pneumothorax or other clear acute process.  I also reviewed radiologist dictation and agree to findings of nodular opacity  representing likely nipple shadow without any other acute thoracic process noted.  CBC without leukocytosis or acute anemia.  BMP shows no significant electrolyte or metabolic derangements.  Patient given breathing  treatment and GI cocktail and reassessment states his breathing and chest discomfort are much improved.  He states he feels comfortable going home.  At this point think is stable for discharge continued outpatient evaluation.  Discussed returning for any new or worsening symptoms.  Advised to continue his albuterol as needed and that I will add Carafate to the omeprazole he is already taking.  Discussed returning for any new or worsening symptoms.      FINAL CLINICAL IMPRESSION(S) / ED DIAGNOSES   Final diagnoses:  Chest pain, unspecified type  SOB (shortness of breath)     Rx / DC Orders   ED Discharge Orders          Ordered    sucralfate (CARAFATE) 1 g tablet  4 times daily        03/15/22 0550             Note:  This document was prepared using Dragon voice recognition software and may include unintentional dictation errors.   Lucrezia Starch, MD 03/15/22 [phone]

## 2022-03-22 ENCOUNTER — Ambulatory Visit (INDEPENDENT_AMBULATORY_CARE_PROVIDER_SITE_OTHER): Payer: Medicare Other | Admitting: Internal Medicine

## 2022-03-22 ENCOUNTER — Encounter: Payer: Self-pay | Admitting: Internal Medicine

## 2022-03-22 VITALS — BP 120/76 | HR 73 | Temp 98.5°F | Resp 16 | Ht 70.0 in | Wt 155.0 lb

## 2022-03-22 DIAGNOSIS — E041 Nontoxic single thyroid nodule: Secondary | ICD-10-CM

## 2022-03-22 DIAGNOSIS — D1803 Hemangioma of intra-abdominal structures: Secondary | ICD-10-CM

## 2022-03-22 DIAGNOSIS — J4520 Mild intermittent asthma, uncomplicated: Secondary | ICD-10-CM

## 2022-03-22 DIAGNOSIS — I10 Essential (primary) hypertension: Secondary | ICD-10-CM | POA: Diagnosis not present

## 2022-03-22 DIAGNOSIS — D696 Thrombocytopenia, unspecified: Secondary | ICD-10-CM

## 2022-03-22 DIAGNOSIS — K219 Gastro-esophageal reflux disease without esophagitis: Secondary | ICD-10-CM | POA: Diagnosis not present

## 2022-03-22 DIAGNOSIS — R0789 Other chest pain: Secondary | ICD-10-CM | POA: Diagnosis not present

## 2022-03-22 DIAGNOSIS — R0602 Shortness of breath: Secondary | ICD-10-CM

## 2022-03-22 MED ORDER — SUCRALFATE 1 G PO TABS: 1.0000 g | ORAL_TABLET | Freq: Three times a day (TID) | ORAL | 0 refills | Status: DC

## 2022-03-22 NOTE — Progress Notes (Signed)
Patient ID: MATTHAN SLEDGE, male   DOB: 11-14-1940, 81 y.o.   MRN: [id number]   Subjective:    Patient ID: MAKAR SLATTER, male    DOB: [birth year], 81 y.o.   MRN: [id number]   Patient here for ER follow up.   Chief Complaint  Patient presents with   Asthma   Gastroesophageal Reflux   Hypertension   .   HPI Was seen in ER 03/15/22 - for chest pain and sob.  States had noticed some discomfort the previous week PTA to ER.  Chest tightness worsened.  In ER, given a breathing treatment.  Also given GI cocktail.  CXR no acute process.  Did mention a nodular opacity within the right lung base which was felt to possibly represent a nipple shadow.  Discussed the need for f/u cxr.  Troponin not elevated. EKG - unrevealing.  Carafate added to his regimen.  States since his visit to ER, he has felt better.  Denies any sob.  No acid reflux.  No increased cough or congestion. No chest pain.  Feels back to normal.  Eating.  No abdominal pain.     Past Medical History:  Diagnosis Date   Asthma    Dyspnea    Elevated blood pressure    Enlarged prostate    GERD (gastroesophageal reflux disease)    Glaucoma    History of adenomatous polyp of colon    Hx of colonic polyps    Hypertension    Leukopenia    has had an extensive w/up.     Liver hemangioma 01/24/2018   Thyroid goiter    Past Surgical History:  Procedure Laterality Date   BACK SURGERY  2002   ruptured disc   CATARACT EXTRACTION W/PHACO Right 06/30/2020   Procedure: CATARACT EXTRACTION PHACO AND INTRAOCULAR LENS PLACEMENT (IOC) RIGHT, Malyugin 7.19 01:30.8 7.9%;  Surgeon: Leandrew Koyanagi, MD;  Location: [facility];  Service: Ophthalmology;  Laterality: Right;   COLONOSCOPY W/ POLYPECTOMY  04/24/2005, 07/06/2010, 07/08/2014   COLONOSCOPY WITH PROPOFOL N/A 02/01/2018   Procedure: COLONOSCOPY WITH PROPOFOL;  Surgeon: Manya Silvas, MD;  Location: [facility] ENDOSCOPY;  Service: Endoscopy;  Laterality: N/A;    ESOPHAGOGASTRODUODENOSCOPY  04/24/2005, 07/06/2010,   EYE SURGERY  2009   relieve pressure from glaucoma   THYROID SURGERY  1968   goiter   Family History  Problem Relation Age of Onset   Heart disease Mother    Alcohol abuse Father    Hyperlipidemia Father    Stroke Father    Social History   Socioeconomic History   Marital status: Married    Spouse name: Not on file   Number of children: Not on file   Years of education: Not on file   Highest education level: Not on file  Occupational History   Not on file  Tobacco Use   Smoking status: Never   Smokeless tobacco: Never  Vaping Use   Vaping Use: Never used  Substance and Sexual Activity   Alcohol use: No    Alcohol/week: 0.0 standard drinks of alcohol   Drug use: No   Sexual activity: Yes  Other Topics Concern   Not on file  Social History Narrative   Not on file   Social Determinants of Health   Financial Resource Strain: Low Risk  (10/14/2021)   Overall Financial Resource Strain (CARDIA)    Difficulty of Paying Living Expenses: Not hard at all  Food Insecurity: No Food Insecurity (10/14/2021)   Hunger Vital  Sign    Worried About Charity fundraiser in the Last Year: Never true    [facility] in the Last Year: Never true  Transportation Needs: No Transportation Needs (10/14/2021)   PRAPARE - Hydrologist (Medical): No    Lack of Transportation (Non-Medical): No  Physical Activity: Sufficiently Active (10/14/2021)   Exercise Vital Sign    Days of Exercise per Week: 7 days    Minutes of Exercise per Session: 60 min  Stress: No Stress Concern Present (10/14/2021)   [place]    Feeling of Stress : Not at all  Social Connections: Unknown (10/14/2021)   Social Connection and Isolation Panel [NHANES]    Frequency of Communication with Friends and Family: Not on file    Frequency of Social Gatherings with Friends and Family:  Not on file    Attends Religious Services: Not on file    Active Member of Clubs or Organizations: Not on file    Attends Archivist Meetings: Not on file    Marital Status: Married     Review of Systems  Constitutional:  Negative for appetite change and unexpected weight change.  HENT:  Negative for congestion and sinus pressure.   Respiratory:  Negative for cough, chest tightness and shortness of breath.   Cardiovascular:  Negative for chest pain, palpitations and leg swelling.  Gastrointestinal:  Negative for abdominal pain, diarrhea, nausea and vomiting.  Genitourinary:  Negative for difficulty urinating and dysuria.  Musculoskeletal:  Negative for joint swelling and myalgias.  Skin:  Negative for color change and rash.  Neurological:  Negative for dizziness, light-headedness and headaches.  Psychiatric/Behavioral:  Negative for agitation and dysphoric mood.        Objective:     BP 120/76 (BP Location: Left Arm, Patient Position: Sitting, Cuff Size: Small)   Pulse 73   Temp 98.5 F (36.9 C) (Temporal)   Resp 16   Ht '5\' 10"'$  (1.778 m)   Wt 155 lb (70.3 kg)   SpO2 99%   BMI 22.24 kg/m  Wt Readings from Last 3 Encounters:  03/22/22 155 lb (70.3 kg)  03/15/22 155 lb (70.3 kg)  01/23/22 156 lb (70.8 kg)    Physical Exam Constitutional:      General: He is not in acute distress.    Appearance: Normal appearance. He is well-developed.  HENT:     Head: Normocephalic and atraumatic.     Right Ear: External ear normal.     Left Ear: External ear normal.  Eyes:     General: No scleral icterus.       Right eye: No discharge.        Left eye: No discharge.  Cardiovascular:     Rate and Rhythm: Normal rate and regular rhythm.  Pulmonary:     Effort: Pulmonary effort is normal. No respiratory distress.     Breath sounds: Normal breath sounds.  Abdominal:     General: Bowel sounds are normal.     Palpations: Abdomen is soft.     Tenderness: There is no  abdominal tenderness.  Musculoskeletal:        General: No swelling or tenderness.     Cervical back: Neck supple. No tenderness.  Lymphadenopathy:     Cervical: No cervical adenopathy.  Skin:    Findings: No erythema or rash.  Neurological:     Mental Status: He is alert.  Psychiatric:        Mood and Affect: Mood normal.        Behavior: Behavior normal.      Outpatient Encounter Medications as of 03/22/2022  Medication Sig   albuterol (VENTOLIN HFA) 108 (90 Base) MCG/ACT inhaler INHALE 2 PUFFS BY MOUTH EVERY 6 HOURS AS NEEDED   amLODipine (NORVASC) 10 MG tablet    BREO ELLIPTA 100-25 MCG/INH AEPB TAKE 1 PUFF BY MOUTH EVERY DAY   latanoprost (XALATAN) 0.005 % ophthalmic solution ADMINISTER 1 DROP TO THE RIGHT EYE NIGHTLY.   omeprazole (PRILOSEC) 40 MG capsule TAKE 1 CAPSULE (40 MG TOTAL) BY MOUTH DAILY.   rosuvastatin (CRESTOR) 5 MG tablet Take 1 tablet (5 mg total) by mouth daily.   tamsulosin (FLOMAX) 0.4 MG CAPS capsule Take 1 capsule (0.4 mg total) by mouth daily.   timolol (TIMOPTIC) 0.5 % ophthalmic solution Place 1 drop into the right eye 2 (two) times daily.   vitamin B-12 (CYANOCOBALAMIN) 1000 MCG tablet Take by mouth.   sucralfate (CARAFATE) 1 g tablet Take 1 tablet (1 g total) by mouth 4 (four) times daily -  with meals and at bedtime.   [DISCONTINUED] amLODipine (NORVASC) 5 MG tablet Take 1 tablet (5 mg total) by mouth daily.   [DISCONTINUED] olmesartan (BENICAR) 5 MG tablet TAKE 1 TABLET (5 MG TOTAL) BY MOUTH DAILY.   [DISCONTINUED] olmesartan (BENICAR) 5 MG tablet Take 1 tablet (5 mg total) by mouth daily.   [DISCONTINUED] sucralfate (CARAFATE) 1 g tablet Take 1 tablet (1 g total) by mouth 4 (four) times daily for 5 days.   No facility-administered encounter medications on file as of 03/22/2022.     Lab Results  Component Value Date   WBC 4.0 03/15/2022   HGB 13.5 03/15/2022   HCT 43.2 03/15/2022   PLT 142 (L) 03/15/2022   GLUCOSE 97 03/15/2022   CHOL 181  02/20/2022   TRIG 37.0 02/20/2022   HDL 73.70 02/20/2022   LDLCALC 100 (H) 02/20/2022   ALT 27 02/20/2022   AST 37 02/20/2022   NA 137 03/15/2022   K 4.1 03/15/2022   CL 105 03/15/2022   CREATININE 1.30 (H) 03/15/2022   BUN 15 03/15/2022   CO2 27 03/15/2022   TSH 1.70 02/20/2022   PSA 2.86 07/11/2021    DG Chest Port 1 View  Result Date: 03/15/2022 CLINICAL DATA:  Left-sided chest pain. EXAM: PORTABLE CHEST 1 VIEW COMPARISON:  August 27, 2017 FINDINGS: The cardiac silhouette is borderline in size. A 1.0 cm diameter round, nodular opacity is seen overlying the right lung base. There is no evidence of acute infiltrate, pleural effusion or pneumothorax. The visualized skeletal structures are unremarkable. IMPRESSION: Nodular opacity within the right lung base which may represent a right nipple shadow. Follow-up chest plain film with nipple markers is recommended to exclude the presence of an underlying pulmonary nodule. Electronically Signed   By: Virgina Norfolk [name]   On: 03/15/2022 01:53       Assessment & Plan:   Problem List Items Addressed This Visit     Asthma    Recent evaluation in ER as outlined.  Breathing stable. No sob now.  No cough or congestion.  Continue breo.  Has albuterol if needed.        GERD (gastroesophageal reflux disease)    On prilosec.  Recent addition of carafate by ER as outlined. Symptoms improved.  Will continue carafate for now.  Discussed f/u with GI, given symptoms despite prilosec.  Follow.       Relevant Medications   sucralfate (CARAFATE) 1 g tablet   Other Relevant Orders   Ambulatory referral to Gastroenterology   Hypertension    On amlodipine '5mg'$  q day.   On benicar '5mg'$  q day.   Follow pressures.  Follow metabolic panel.       Relevant Medications   amLODipine (NORVASC) 10 MG tablet   Liver hemangioma    Just saw GI 12/2021.  Felt to be benign.  Recommended no surveillance f/u.       Relevant Medications   amLODipine (NORVASC)  10 MG tablet   Shortness of breath    Reports the chest tightness and sob as outlined - presented to ER 03/15/22.  W/up as outlined.  Given breathing treatment and GI cocktail.  Discharged on carafate.   CXR no acute process.  Did mention a nodular opacity within the right lung base which was felt to possibly represent a nipple shadow.  Discussed the need for f/u cxr. Of note, d dimer slightly elevated.  Per review of ER note, D-dimer 0.63 is within age-adjusted limits and overall have a lower suspicion for PE at this time.  Since discharge, he has done well.  No chest pain, chest tightness or sob.  No increased cough or congestion.  Continues on carafate.  Discussed f/u with GI and cardiology - given risk factors.  Will hold on f/u cxr - may consider CT - calcium score.  Follow.       Relevant Orders   Ambulatory referral to Cardiology   Thrombocytopenia ([place])    Follow cbc.       Thyroid nodule    Just evaluated by endocrinology - 10/2021.  Recommended f/u thyroid ultrasound in one year.        Other Visit Diagnoses     Chest tightness    -  Primary   Relevant Orders   Ambulatory referral to Cardiology   Ambulatory referral to Gastroenterology        Einar Pheasant, MD

## 2022-03-28 ENCOUNTER — Encounter: Payer: Self-pay | Admitting: Internal Medicine

## 2022-03-28 NOTE — Assessment & Plan Note (Signed)
Just evaluated by endocrinology - 10/2021.  Recommended f/u thyroid ultrasound in one year.   

## 2022-03-28 NOTE — Assessment & Plan Note (Signed)
Recent evaluation in ER as outlined.  Breathing stable. No sob now.  No cough or congestion.  Continue breo.  Has albuterol if needed.

## 2022-03-28 NOTE — Assessment & Plan Note (Signed)
Just saw GI 12/2021.  Felt to be benign.  Recommended no surveillance f/u.  

## 2022-03-28 NOTE — Assessment & Plan Note (Signed)
On prilosec.  Recent addition of carafate by ER as outlined. Symptoms improved.  Will continue carafate for now.  Discussed f/u with GI, given symptoms despite prilosec.  Follow.

## 2022-03-28 NOTE — Assessment & Plan Note (Signed)
Reports the chest tightness and sob as outlined - presented to ER 03/15/22.  W/up as outlined.  Given breathing treatment and GI cocktail.  Discharged on carafate.   CXR no acute process.  Did mention a nodular opacity within the right lung base which was felt to possibly represent a nipple shadow.  Discussed the need for f/u cxr. Of note, d dimer slightly elevated.  Per review of ER note, D-dimer 0.63 is within age-adjusted limits and overall have a lower suspicion for PE at this time.  Since discharge, he has done well.  No chest pain, chest tightness or sob.  No increased cough or congestion.  Continues on carafate.  Discussed f/u with GI and cardiology - given risk factors.  Will hold on f/u cxr - may consider CT - calcium score.  Follow.

## 2022-03-28 NOTE — Assessment & Plan Note (Signed)
On amlodipine '5mg'$  q day.   On benicar '5mg'$  q day.   Follow pressures.  Follow metabolic panel.

## 2022-03-28 NOTE — Assessment & Plan Note (Signed)
Follow cbc.  

## 2022-04-13 ENCOUNTER — Other Ambulatory Visit: Payer: Medicare Other

## 2022-04-18 ENCOUNTER — Other Ambulatory Visit (INDEPENDENT_AMBULATORY_CARE_PROVIDER_SITE_OTHER): Payer: Medicare Other

## 2022-04-18 DIAGNOSIS — E785 Hyperlipidemia, unspecified: Secondary | ICD-10-CM

## 2022-04-18 LAB — HEPATIC FUNCTION PANEL
ALT: 22 U/L (ref 0–53)
AST: 29 U/L (ref 0–37)
Albumin: 4.5 g/dL (ref 3.5–5.2)
Alkaline Phosphatase: 69 U/L (ref 39–117)
Bilirubin, Direct: 0.2 mg/dL (ref 0.0–0.3)
Total Bilirubin: 0.7 mg/dL (ref 0.2–1.2)
Total Protein: 6.8 g/dL (ref 6.0–8.3)

## 2022-04-19 ENCOUNTER — Other Ambulatory Visit: Payer: Self-pay | Admitting: Internal Medicine

## 2022-04-22 ENCOUNTER — Other Ambulatory Visit: Payer: Self-pay | Admitting: Internal Medicine

## 2022-05-04 ENCOUNTER — Encounter: Payer: Self-pay | Admitting: Internal Medicine

## 2022-05-08 DIAGNOSIS — K219 Gastro-esophageal reflux disease without esophagitis: Secondary | ICD-10-CM | POA: Diagnosis not present

## 2022-05-08 DIAGNOSIS — R0789 Other chest pain: Secondary | ICD-10-CM | POA: Diagnosis not present

## 2022-05-08 DIAGNOSIS — R634 Abnormal weight loss: Secondary | ICD-10-CM | POA: Diagnosis not present

## 2022-05-10 ENCOUNTER — Ambulatory Visit: Payer: Medicare Other | Admitting: Anesthesiology

## 2022-05-10 ENCOUNTER — Encounter: Payer: Self-pay | Admitting: Internal Medicine

## 2022-05-10 ENCOUNTER — Encounter
Admission: RE | Disposition: A | Discharge status: Home / Self Care | Payer: Self-pay | Source: Home / Self Care | Attending: Internal Medicine

## 2022-05-10 ENCOUNTER — Ambulatory Visit
Admission: RE | Admit: 2022-05-10 | Discharge: 2022-05-10 | Disposition: A | Discharge status: Home / Self Care | Payer: Medicare Other | Attending: Internal Medicine | Admitting: Internal Medicine

## 2022-05-10 DIAGNOSIS — Z6821 Body mass index (BMI) 21.0-21.9, adult: Secondary | ICD-10-CM | POA: Diagnosis not present

## 2022-05-10 DIAGNOSIS — R634 Abnormal weight loss: Secondary | ICD-10-CM | POA: Insufficient documentation

## 2022-05-10 DIAGNOSIS — K2100 Gastro-esophageal reflux disease with esophagitis, without bleeding: Secondary | ICD-10-CM | POA: Diagnosis not present

## 2022-05-10 DIAGNOSIS — K2950 Unspecified chronic gastritis without bleeding: Secondary | ICD-10-CM | POA: Diagnosis not present

## 2022-05-10 DIAGNOSIS — R0789 Other chest pain: Secondary | ICD-10-CM | POA: Diagnosis not present

## 2022-05-10 DIAGNOSIS — Z862 Personal history of diseases of the blood and blood-forming organs and certain disorders involving the immune mechanism: Secondary | ICD-10-CM | POA: Diagnosis not present

## 2022-05-10 DIAGNOSIS — J45909 Unspecified asthma, uncomplicated: Secondary | ICD-10-CM | POA: Diagnosis not present

## 2022-05-10 DIAGNOSIS — K219 Gastro-esophageal reflux disease without esophagitis: Secondary | ICD-10-CM | POA: Diagnosis not present

## 2022-05-10 DIAGNOSIS — K3189 Other diseases of stomach and duodenum: Secondary | ICD-10-CM | POA: Diagnosis not present

## 2022-05-10 DIAGNOSIS — Z79899 Other long term (current) drug therapy: Secondary | ICD-10-CM | POA: Diagnosis not present

## 2022-05-10 DIAGNOSIS — K449 Diaphragmatic hernia without obstruction or gangrene: Secondary | ICD-10-CM | POA: Insufficient documentation

## 2022-05-10 DIAGNOSIS — H409 Unspecified glaucoma: Secondary | ICD-10-CM | POA: Insufficient documentation

## 2022-05-10 DIAGNOSIS — K2970 Gastritis, unspecified, without bleeding: Secondary | ICD-10-CM | POA: Diagnosis not present

## 2022-05-10 HISTORY — PX: ESOPHAGOGASTRODUODENOSCOPY (EGD) WITH PROPOFOL: SHX5813

## 2022-05-10 SURGERY — ESOPHAGOGASTRODUODENOSCOPY (EGD) WITH PROPOFOL
Anesthesia: General

## 2022-05-10 MED ORDER — PROPOFOL 10 MG/ML IV BOLUS
INTRAVENOUS | Status: DC | PRN
  Administered 2022-05-10: 50 mg via INTRAVENOUS
  Administered 2022-05-10 (×2): 20 mg via INTRAVENOUS
  Administered 2022-05-10: 10 mg via INTRAVENOUS

## 2022-05-10 MED ORDER — SODIUM CHLORIDE 0.9 % IV SOLN: INTRAVENOUS | Status: DC

## 2022-05-10 NOTE — Anesthesia Procedure Notes (Signed)
Procedure Name: MAC Date/Time: 05/10/2022 4:15 PM  Performed by: Jerrye Noble, CRNAPre-anesthesia Checklist: Patient identified, Emergency Drugs available, Suction available and Patient being monitored Patient Re-evaluated:Patient Re-evaluated prior to induction Oxygen Delivery Method: Nasal cannula

## 2022-05-10 NOTE — Anesthesia Preprocedure Evaluation (Addendum)
Anesthesia Evaluation  Patient identified by MRN, date of birth, ID band Patient awake    Reviewed: Allergy & Precautions, NPO status , Patient's Chart, lab work & pertinent test results  Airway Mallampati: II  TM Distance: >3 FB Neck ROM: Full    Dental  (+) Teeth Intact   Pulmonary neg pulmonary ROS, asthma ,    Pulmonary exam normal breath sounds clear to auscultation       Cardiovascular Exercise Tolerance: Good hypertension, Pt. on medications negative cardio ROS Normal cardiovascular exam Rhythm:Regular     Neuro/Psych negative neurological ROS  negative psych ROS   GI/Hepatic negative GI ROS, Neg liver ROS, GERD  Medicated,  Endo/Other  negative endocrine ROS  Renal/GU negative Renal ROS  negative genitourinary   Musculoskeletal   Abdominal Normal abdominal exam  (+)   Peds negative pediatric ROS (+)  Hematology negative hematology ROS (+)   Anesthesia Other Findings Past Medical History: No date: Asthma No date: Dyspnea No date: Elevated blood pressure No date: Enlarged prostate No date: GERD (gastroesophageal reflux disease) No date: Glaucoma No date: History of adenomatous polyp of colon No date: Hx of colonic polyps No date: Hypertension No date: Leukopenia     Comment:  has had an extensive w/up.   01/24/2018: Liver hemangioma No date: Thyroid goiter  Past Surgical History: 2002: BACK SURGERY     Comment:  ruptured disc 06/30/2020: CATARACT EXTRACTION W/PHACO; Right     Comment:  Procedure: CATARACT EXTRACTION PHACO AND INTRAOCULAR               LENS PLACEMENT (IOC) RIGHT, Malyugin 7.19 01:30.8 7.9%;                Surgeon: Leandrew Koyanagi, MD;  Location: [facility];  Service: Ophthalmology;  Laterality:               Right; 04/24/2005, 07/06/2010, 07/08/2014: COLONOSCOPY W/ POLYPECTOMY 02/01/2018: COLONOSCOPY WITH PROPOFOL; N/A     Comment:  Procedure: COLONOSCOPY  WITH PROPOFOL;  Surgeon: Manya Silvas, MD;  Location: [facility] ENDOSCOPY;  Service:               Endoscopy;  Laterality: N/A; 04/24/2005, 07/06/2010,: ESOPHAGOGASTRODUODENOSCOPY 2009: EYE SURGERY     Comment:  relieve pressure from glaucoma 1968: THYROID SURGERY     Comment:  goiter  BMI    Body Mass Index: 21.35 kg/m      Reproductive/Obstetrics negative OB ROS                            Anesthesia Physical Anesthesia Plan  ASA: 2  Anesthesia Plan: General   Post-op Pain Management:    Induction: Intravenous  PONV Risk Score and Plan: Propofol infusion and TIVA  Airway Management Planned: Natural Airway  Additional Equipment:   Intra-op Plan:   Post-operative Plan:   Informed Consent: I have reviewed the patients History and Physical, chart, labs and discussed the procedure including the risks, benefits and alternatives for the proposed anesthesia with the patient or authorized representative who has indicated his/her understanding and acceptance.     Dental Advisory Given  Plan Discussed with: CRNA and Surgeon  Anesthesia Plan Comments:         Anesthesia Quick Evaluation

## 2022-05-10 NOTE — H&P (Signed)
Outpatient short stay form Pre-procedure 05/10/2022 4:07 PM Jose Perry K. Alice Reichert, [name]  Primary Physician: Einar Pheasant, [name]  Reason for visit:  GERD,asthma , weight loss, atypical chest pain.  History of present illness:  81 y/o AA male with a PMH of leukopenia, thrombocytopenia, glaucoma, GERD, asthma, and HTN presents to the [place] [facility] for [facility] ED follow-up of atypical chest pain, GERD, and 10-lb unintentional weight loss  Atypical chest pain GERD Unintentional weight loss - 10-lbs x 5 months  - Recent [facility] ED visit 6/7 for atypical chest pain which appears most c/w GERD etiology. Cardiac work-up was negative. CXR negative.  - DDx includes esophageal dysmotility, erosive esophagitis, GERD, gastritis, PUD, esophageal or gastric adenocarcinoma, functional dyspepsia, etc - Continue omeprazole 40 mg PO daily - advised patient to take 15-30 minutes before breakfast on an empty stomach. Consider dose increase to BID if chest pain returns.  - Advise EGD for endoluminal evaluation to r/o structural abnormality - IDA resolved and hemoglobin now within normal limits - Schedule EGD at [facility] with soonest available provider    Current Facility-Administered Medications:    0.9 %  sodium chloride infusion, , Intravenous, Continuous, Fronie Holstein, Benay Pike, MD, Last Rate: 20 mL/hr at 05/10/22 1550, Continued from Pre-op at 05/10/22 1550  Medications Prior to Admission  Medication Sig Dispense Refill Last Dose   albuterol (VENTOLIN HFA) 108 (90 Base) MCG/ACT inhaler INHALE 2 PUFFS BY MOUTH EVERY 6 HOURS AS NEEDED 18 each 2 05/10/2022   amLODipine (NORVASC) 10 MG tablet TAKE 1 TABLET BY MOUTH EVERY DAY 90 tablet 1 05/10/2022   BREO ELLIPTA 100-25 MCG/INH AEPB TAKE 1 PUFF BY MOUTH EVERY DAY 180 each 2 Past Week   ferrous sulfate 325 (65 FE) MG EC tablet Take 325 mg by mouth 3 (three) times daily with meals.   05/09/2022   omeprazole (PRILOSEC) 40 MG capsule TAKE 1 CAPSULE (40 MG TOTAL) BY MOUTH DAILY. 90  capsule 1 05/09/2022   rosuvastatin (CRESTOR) 5 MG tablet Take 1 tablet (5 mg total) by mouth daily. 90 tablet 3 05/09/2022   sucralfate (CARAFATE) 1 g tablet TAKE 1 TABLET (1 G TOTAL) BY MOUTH 4 TIMES A DAY WITH MEALS AND AT BEDTIME 120 tablet 0 Past Week   latanoprost (XALATAN) 0.005 % ophthalmic solution ADMINISTER 1 DROP TO THE RIGHT EYE NIGHTLY.      tamsulosin (FLOMAX) 0.4 MG CAPS capsule Take 1 capsule (0.4 mg total) by mouth daily. 90 capsule 3    timolol (TIMOPTIC) 0.5 % ophthalmic solution Place 1 drop into the right eye 2 (two) times daily.      vitamin B-12 (CYANOCOBALAMIN) 1000 MCG tablet Take by mouth.        Allergies  Allergen Reactions   Brimonidine     Other reaction(s): Eye Redness   Brimonidine Tartrate     Other reaction(s): Eye redness   Brinzolamide-Brimonidine     Other reaction(s): Eye redness   Other Other (See Comments)    Simbrinza Eye Drops gives pt Eye redness     Past Medical History:  Diagnosis Date   Asthma    Dyspnea    Elevated blood pressure    Enlarged prostate    GERD (gastroesophageal reflux disease)    Glaucoma    History of adenomatous polyp of colon    Hx of colonic polyps    Hypertension    Leukopenia    has had an extensive w/up.     Liver hemangioma 01/24/2018   Thyroid  goiter     Review of systems:  Otherwise negative.    Physical Exam  Gen: Alert, oriented. Appears stated age.  HEENT: [place]/AT. PERRLA. Lungs: CTA, no wheezes. CV: RR nl S1, S2. Abd: soft, benign, no masses. BS+ Ext: No edema. Pulses 2+    Planned procedures: Proceed with EGD. The patient understands the nature of the planned procedure, indications, risks, alternatives and potential complications including but not limited to bleeding, infection, perforation, damage to internal organs and possible oversedation/side effects from anesthesia. The patient agrees and gives consent to proceed.  Please refer to procedure notes for findings, recommendations and  patient disposition/instructions.     Meryl Ponder K. Alice Reichert, [name] Gastroenterology 05/10/2022  4:07 PM

## 2022-05-10 NOTE — Op Note (Signed)
[facility] Gastroenterology Patient Name: Jose Perry Procedure Date: 05/10/2022 4:13 PM MRN: [id number] Account #: [id number] Date of Birth: 1941-05-14 Admit Type: Outpatient Age: 81 Room: [facility] ENDO ROOM 2 Gender: Male Note Status: Finalized Instrument Name: Upper Endoscope [id number] Procedure:             Upper GI endoscopy Indications:           Gastro-esophageal reflux disease, Chest pain (non                         cardiac), Weight loss Providers:             Benay Pike. Verlie Liotta MD, MD Medicines:             Propofol per Anesthesia Complications:         No immediate complications. Procedure:             Pre-Anesthesia Assessment:                        - The risks and benefits of the procedure and the                         sedation options and risks were discussed with the                         patient. All questions were answered and informed                         consent was obtained.                        - Patient identification and proposed procedure were                         verified prior to the procedure by the nurse. The                         procedure was verified in the procedure room.                        - ASA Grade Assessment: III - A patient with severe                         systemic disease.                        - After reviewing the risks and benefits, the patient                         was deemed in satisfactory condition to undergo the                         procedure.                        After obtaining informed consent, the endoscope was                         passed under direct vision. Throughout the procedure,  the patient's blood pressure, pulse, and oxygen                         saturations were monitored continuously. The Endoscope                         was introduced through the mouth, and advanced to the                         third part of duodenum. The upper GI endoscopy was                          accomplished without difficulty. The patient tolerated                         the procedure well. Findings:      The esophagus was normal.      Patchy mild inflammation characterized by erythema was found in the       gastric antrum. Biopsies were taken with a cold forceps for Helicobacter       pylori testing.      A 2 cm hiatal hernia was present.      The examined duodenum was normal.      The exam was otherwise without abnormality. Impression:            - Normal esophagus.                        - Gastritis. Biopsied.                        - 2 cm hiatal hernia.                        - Normal examined duodenum.                        - The examination was otherwise normal. Recommendation:        - Patient has a contact number available for                         emergencies. The signs and symptoms of potential                         delayed complications were discussed with the patient.                         Return to normal activities tomorrow. Written                         discharge instructions were provided to the patient.                        - Resume previous diet.                        - Continue present medications.                        - Await pathology results.                        -  Return to physician assistant in 3 months.                        - Telephone GI office to schedule appointment.                        - Follow up with Jose Bruckner, PA-C in the GI office.                         [phone] Procedure Code(s):     --- Professional ---                        [phone], Esophagogastroduodenoscopy, flexible,                         transoral; with biopsy, single or multiple Diagnosis Code(s):     --- Professional ---                        R63.4, Abnormal weight loss                        R07.89, Other chest pain                        K21.9, Gastro-esophageal reflux disease without                         esophagitis                         K44.9, Diaphragmatic hernia without obstruction or                         gangrene                        K29.70, Gastritis, unspecified, without bleeding CPT copyright 2019 American Medical Association. All rights reserved. The codes documented in this report are preliminary and upon coder review may  be revised to meet current compliance requirements. Jose Sella MD, MD 05/10/2022 4:29:09 PM This report has been signed electronically. Number of Addenda: 0 Note Initiated On: 05/10/2022 4:13 PM Estimated Blood Loss:  Estimated blood loss: none. Estimated blood loss: none.      [facility]

## 2022-05-10 NOTE — Transfer of Care (Signed)
Immediate Anesthesia Transfer of Care Note  Patient: Jose Perry  Procedure(s) Performed: ESOPHAGOGASTRODUODENOSCOPY (EGD) WITH PROPOFOL  Patient Location: PACU and Endoscopy Unit  Anesthesia Type:General  Level of Consciousness: drowsy  Airway & Oxygen Therapy: Patient Spontanous Breathing  Post-op Assessment: Report given to RN and Post -op Vital signs reviewed and stable  Post vital signs: Reviewed and stable  Last Vitals:  Vitals Value Taken Time  BP 118/80 05/10/22 1630  Temp 35.8 C 05/10/22 1628  Pulse 51 05/10/22 1630  Resp 18 05/10/22 1630  SpO2 100 % 05/10/22 1630  Vitals shown include unvalidated device data.  Last Pain:  Vitals:   05/10/22 1628  TempSrc: Temporal  PainSc: Asleep         Complications: No notable events documented.

## 2022-05-10 NOTE — Interval H&P Note (Signed)
History and Physical Interval Note:  05/10/2022 4:08 PM  Jose Perry  has presented today for surgery, with the diagnosis of GERD.  The various methods of treatment have been discussed with the patient and family. After consideration of risks, benefits and other options for treatment, the patient has consented to  Procedure(s): ESOPHAGOGASTRODUODENOSCOPY (EGD) WITH PROPOFOL (N/A) as a surgical intervention.  The patient's history has been reviewed, patient examined, no change in status, stable for surgery.  I have reviewed the patient's chart and labs.  Questions were answered to the patient's satisfaction.     [place]itch[place], [place]

## 2022-05-11 ENCOUNTER — Encounter: Payer: Self-pay | Admitting: Internal Medicine

## 2022-05-11 DIAGNOSIS — H401112 Primary open-angle glaucoma, right eye, moderate stage: Secondary | ICD-10-CM | POA: Diagnosis not present

## 2022-05-11 NOTE — Anesthesia Postprocedure Evaluation (Signed)
Anesthesia Post Note  Patient: Jose Perry  Procedure(s) Performed: ESOPHAGOGASTRODUODENOSCOPY (EGD) WITH PROPOFOL  Patient location during evaluation: PACU Anesthesia Type: General Level of consciousness: awake and awake and alert Pain management: pain level controlled Vital Signs Assessment: post-procedure vital signs reviewed and stable Respiratory status: spontaneous breathing and respiratory function stable Cardiovascular status: stable Anesthetic complications: no   No notable events documented.   Last Vitals:  Vitals:   05/10/22 1628 05/10/22 1648  BP: 118/80 (!) 181/95  Pulse:    Resp:    Temp: (!) 35.8 C   SpO2: 100% 100%    Last Pain:  Vitals:   05/10/22 1648  TempSrc:   PainSc: 0-No pain                 VAN STAVEREN,Allean [place]

## 2022-05-12 LAB — SURGICAL PATHOLOGY

## 2022-05-13 ENCOUNTER — Other Ambulatory Visit: Payer: Self-pay | Admitting: Internal Medicine

## 2022-05-15 ENCOUNTER — Ambulatory Visit (INDEPENDENT_AMBULATORY_CARE_PROVIDER_SITE_OTHER): Payer: Medicare Other | Admitting: Cardiology

## 2022-05-15 ENCOUNTER — Other Ambulatory Visit
Admission: RE | Admit: 2022-05-15 | Discharge: 2022-05-15 | Disposition: A | Discharge status: Home / Self Care | Payer: Medicare Other | Source: Ambulatory Visit | Attending: Cardiology | Admitting: Cardiology

## 2022-05-15 ENCOUNTER — Encounter: Payer: Self-pay | Admitting: Cardiology

## 2022-05-15 VITALS — BP 140/80 | HR 53 | Ht 70.0 in | Wt 149.2 lb

## 2022-05-15 DIAGNOSIS — E7800 Pure hypercholesterolemia, unspecified: Secondary | ICD-10-CM

## 2022-05-15 DIAGNOSIS — I2510 Atherosclerotic heart disease of native coronary artery without angina pectoris: Secondary | ICD-10-CM | POA: Insufficient documentation

## 2022-05-15 DIAGNOSIS — I2584 Coronary atherosclerosis due to calcified coronary lesion: Secondary | ICD-10-CM

## 2022-05-15 DIAGNOSIS — R072 Precordial pain: Secondary | ICD-10-CM | POA: Diagnosis not present

## 2022-05-15 DIAGNOSIS — I10 Essential (primary) hypertension: Secondary | ICD-10-CM | POA: Diagnosis not present

## 2022-05-15 LAB — BASIC METABOLIC PANEL
Anion gap: 7 (ref 5–15)
BUN: 16 mg/dL (ref 8–23)
CO2: 28 mmol/L (ref 22–32)
Calcium: 9.7 mg/dL (ref 8.9–10.3)
Chloride: 106 mmol/L (ref 98–111)
Creatinine, Ser: 1.29 mg/dL — ABNORMAL HIGH (ref 0.61–1.24)
GFR, Estimated: 56 mL/min — ABNORMAL LOW (ref >60)
Glucose, Bld: 73 mg/dL (ref 70–99)
Potassium: 3.5 mmol/L (ref 3.5–5.1)
Sodium: 141 mmol/L (ref 135–145)

## 2022-05-15 LAB — LIPID PANEL
Cholesterol: 151 mg/dL (ref 0–200)
HDL: 73 mg/dL (ref >40)
LDL Cholesterol: 71 mg/dL (ref 0–99)
Total CHOL/HDL Ratio: 2.1 RATIO
Triglycerides: 36 mg/dL (ref <150)
VLDL: 7 mg/dL (ref 0–40)

## 2022-05-15 MED ORDER — METOPROLOL TARTRATE 50 MG PO TABS: ORAL_TABLET | ORAL | 0 refills | Status: DC

## 2022-05-15 NOTE — Patient Instructions (Signed)
Medication Instructions:  Your physician recommends that you continue on your current medications as directed. Please refer to the Current Medication list given to you today.  A one time dose of Metoprolol 50 mg to be taken 2 hours prior to your Cardiac CTA has been sent to your pharmacy.  *If you need a refill on your cardiac medications before your next appointment, please call your pharmacy*   Lab Work: Your physician recommends that you return for a FASTING lipid profile and bmp:  Please have your lab drawn at the [facility]. No appt needed. Lab hours are Mon-Fri 7am-6pm. Stop at the Registration desk to check in.  If you have labs (blood work) drawn today and your tests are completely normal, you will receive your results only by: [place] (if you have MyChart) OR A paper copy in the mail If you have any lab test that is abnormal or we need to change your treatment, we will call you to review the results.   Testing/Procedures: Your physician has requested that you have an echocardiogram. Echocardiography is a painless test that uses sound waves to create images of your heart. It provides your doctor with information about the size and shape of your heart and how well your heart's chambers and valves are working. This procedure takes approximately one hour. There are no restrictions for this procedure.  Your physician has requested that you have cardiac CT. Cardiac computed tomography (CT) is a painless test that uses an x-ray machine to take clear, detailed pictures of your heart. For further information please visit [email or web address].[email or web address]. Please follow instruction sheet as given.    Follow-Up: At [facility], you and your health needs are our priority.  As part of our continuing mission to provide you with exceptional heart care, we have created designated Provider Care Teams.  These Care Teams include your primary Cardiologist (physician) and Advanced Practice Providers  (APPs -  Physician Assistants and Nurse Practitioners) who all work together to provide you with the care you need, when you need it.  We recommend signing up for the patient portal called "MyChart".  Sign up information is provided on this After Visit Summary.  MyChart is used to connect with patients for Virtual Visits (Telemedicine).  Patients are able to view lab/test results, encounter notes, upcoming appointments, etc.  Non-urgent messages can be sent to your provider as well.   To learn more about what you can do with MyChart, go to [email or web address].    Your next appointment:   After testing   The format for your next appointment:   In Person  Provider:   You may see Kate Sable, MD or one of the following Advanced Practice Providers on your designated Care Team:   Murray Hodgkins, NP Christell Faith, PA-C Cadence Kathlen Mody, [place]   Other Instructions Your physician has requested that you have cardiac CT. Cardiac computed tomography (CT) is a painless test that uses an x-ray machine to take clear, detailed pictures of your heart.    Your cardiac CT will be scheduled at:  [facility] [address] [place], [place] [address] [phone]  Please arrive 15 mins early for check-in and test prep.  Your test is scheduled on Thurs 05/18/22 @ 12:30pm  Please follow these instructions carefully (unless otherwise directed):  Hold all erectile dysfunction medications at least 3 days (72 hrs) prior to test.  On the Night Before the Test: Be sure to Drink plenty of water.  Do not consume any caffeinated/decaffeinated beverages or chocolate 12 hours prior to your test.  On the Day of the Test: Drink plenty of water until 1 hour prior to the test. Do not eat any food 4 hours prior to the test. You may take your regular medications prior to the test.  Take metoprolol (Lopressor) two hours prior to test.        After the  Test: Drink plenty of water. After receiving IV contrast, you may experience a mild flushed feeling. This is normal. On occasion, you may experience a mild rash up to 24 hours after the test. This is not dangerous. If this occurs, you can take Benadryl 25 mg and increase your fluid intake. If you experience trouble breathing, this can be serious. If it is severe call 911 IMMEDIATELY. If it is mild, please call our office. If you take any of these medications: Glipizide/Metformin, Avandament, Glucavance, please do not take 48 hours after completing test unless otherwise instructed.  Please allow 2-4 weeks for scheduling of routine cardiac CTs. Some insurance companies require a pre-authorization which may delay scheduling of this test.   For non-scheduling related questions, please contact the cardiac imaging nurse navigator should you have any questions/concerns: Marchia Bond, Cardiac Imaging Nurse Navigator Gordy Clement, Cardiac Imaging Nurse Navigator [facility] Direct Office Dial: [phone]   For scheduling needs, including cancellations and rescheduling, please call [place], [phone].    Important Information About Sugar

## 2022-05-15 NOTE — Progress Notes (Signed)
Cardiology Office Note:    Date:  05/15/2022   ID:  Jose Perry, Jose Perry 11/26/1940, MRN [id number]  PCP:  Einar Pheasant, [place] Providers Cardiologist:  None     Referring MD: Einar Pheasant, MD   Chief Complaint  Patient presents with   New Patient    SOB, Chest tightness, seen in ED 03-15-2022    History of Present Illness:    Jose Perry is a 81 y.o. male with a hx of hypertension, hyperlipidemia, GERD who presents due to chest pain and shortness of breath.  He complains of ongoing chest pain over the past 3 months.  Symptoms are not associated with exertion.  Presented to the ED due to persistent chest pain about 2 months ago, symptoms were atypical, diagnosed with possible GERD.  Followed up with gastroenterology, was given omeprazole and Carafate with some improvement in symptoms.  Had an EGD last week showing mild gastritis, no other significant findings.  Carafate was stopped, omeprazole was continued.  He states being very active, performs 4 sets of push-ups x200 daily, bench pressing without any chest pain, has some shortness of breath.  Started on Crestor 5 mg daily 2 months ago.  Chest CT 09/2017 coronary artery calcifications noted.  Past Medical History:  Diagnosis Date   Asthma    Dyspnea    Elevated blood pressure    Enlarged prostate    GERD (gastroesophageal reflux disease)    Glaucoma    History of adenomatous polyp of colon    Hx of colonic polyps    Hypertension    Leukopenia    has had an extensive w/up.     Liver hemangioma 01/24/2018   Thyroid goiter     Past Surgical History:  Procedure Laterality Date   BACK SURGERY  2002   ruptured disc   CATARACT EXTRACTION W/PHACO Right 06/30/2020   Procedure: CATARACT EXTRACTION PHACO AND INTRAOCULAR LENS PLACEMENT (IOC) RIGHT, [place] 7.19 01:30.8 7.9%;  Surgeon: Leandrew Koyanagi, MD;  Location: [facility] [place];  Service: Ophthalmology;  Laterality: Right;   COLONOSCOPY  W/ POLYPECTOMY  04/24/2005, 07/06/2010, 07/08/2014   COLONOSCOPY WITH PROPOFOL N/A 02/01/2018   Procedure: COLONOSCOPY WITH PROPOFOL;  Surgeon: Manya Silvas, MD;  Location: [facility] ENDOSCOPY;  Service: Endoscopy;  Laterality: N/A;   ESOPHAGOGASTRODUODENOSCOPY  04/24/2005, 07/06/2010,   ESOPHAGOGASTRODUODENOSCOPY (EGD) WITH PROPOFOL N/A 05/10/2022   Procedure: ESOPHAGOGASTRODUODENOSCOPY (EGD) WITH PROPOFOL;  Surgeon: Toledo, Benay Pike, MD;  Location: [facility] ENDOSCOPY;  Service: Gastroenterology;  Laterality: N/A;   EYE SURGERY  2009   relieve pressure from glaucoma   THYROID SURGERY  1968   goiter    Current Medications: Current Meds  Medication Sig   albuterol (VENTOLIN HFA) 108 (90 Base) MCG/ACT inhaler INHALE 2 PUFFS BY MOUTH EVERY 6 HOURS AS NEEDED   amLODipine (NORVASC) 10 MG tablet TAKE 1 TABLET BY MOUTH EVERY DAY   BREO ELLIPTA 100-25 MCG/INH AEPB TAKE 1 PUFF BY MOUTH EVERY DAY   ferrous sulfate 325 (65 FE) MG EC tablet Take 325 mg by mouth daily.   latanoprost (XALATAN) 0.005 % ophthalmic solution ADMINISTER 1 DROP TO THE RIGHT EYE NIGHTLY.   metoprolol tartrate (LOPRESSOR) 50 MG tablet Take 1 tablet (50 mg) 2 hours prior to Cardiac CTA   omeprazole (PRILOSEC) 40 MG capsule TAKE 1 CAPSULE (40 MG TOTAL) BY MOUTH DAILY.   rosuvastatin (CRESTOR) 5 MG tablet Take 1 tablet (5 mg total) by mouth daily.   tamsulosin (FLOMAX) 0.4  MG CAPS capsule Take 1 capsule (0.4 mg total) by mouth daily.   timolol (TIMOPTIC) 0.5 % ophthalmic solution Place 1 drop into the right eye 2 (two) times daily.   vitamin B-12 (CYANOCOBALAMIN) 1000 MCG tablet Take by mouth.     Allergies:   Brimonidine, Brimonidine tartrate, Brinzolamide-brimonidine, and Other   Social History   Socioeconomic History   Marital status: Married    Spouse name: Not on file   Number of children: Not on file   Years of education: Not on file   Highest education level: Not on file  Occupational History   Not on file  Tobacco Use    Smoking status: Never   Smokeless tobacco: Never  Vaping Use   Vaping Use: Never used  Substance and Sexual Activity   Alcohol use: No    Alcohol/week: 0.0 standard drinks of alcohol   Drug use: No   Sexual activity: Yes  Other Topics Concern   Not on file  Social History Narrative   Not on file   Social Determinants of Health   Financial Resource Strain: Low Risk  (10/14/2021)   Overall Financial Resource Strain (CARDIA)    Difficulty of Paying Living Expenses: Not hard at all  Food Insecurity: No Food Insecurity (10/14/2021)   Hunger Vital Sign    Worried About Running Out of Food in the Last Year: Never true    [place] in the Last Year: Never true  Transportation Needs: No Transportation Needs (10/14/2021)   PRAPARE - Hydrologist (Medical): No    Lack of Transportation (Non-Medical): No  Physical Activity: Sufficiently Active (10/14/2021)   Exercise Vital Sign    Days of Exercise per Week: 7 days    Minutes of Exercise per Session: 60 min  Stress: No Stress Concern Present (10/14/2021)   [place]    Feeling of Stress : Not at all  Social Connections: Unknown (10/14/2021)   Social Connection and Isolation Panel [NHANES]    Frequency of Communication with Friends and Family: Not on file    Frequency of Social Gatherings with Friends and Family: Not on file    Attends Religious Services: Not on file    Active Member of Clubs or Organizations: Not on file    Attends Archivist Meetings: Not on file    Marital Status: Married     Family History: The patient's family history includes Alcohol abuse in his father; Heart disease in his mother; Hyperlipidemia in his father; Stroke in his father.  ROS:   Please see the history of present illness.     All other systems reviewed and are negative.  EKGs/Labs/Other Studies Reviewed:    The following studies were reviewed  today:   EKG:  EKG is  ordered today.  The ekg ordered today demonstrates sinus bradycardia, heart rate 59, otherwise normal ECG  Recent Labs: 02/20/2022: TSH 1.70 03/15/2022: BUN 15; Creatinine, Ser 1.30; Hemoglobin 13.5; Platelets 142; Potassium 4.1; Sodium 137 04/18/2022: ALT 22  Recent Lipid Panel    Component Value Date/Time   CHOL 181 02/20/2022 0904   TRIG 37.0 02/20/2022 0904   HDL 73.70 02/20/2022 0904   CHOLHDL 2 02/20/2022 0904   VLDL 7.4 02/20/2022 0904   LDLCALC 100 (H) 02/20/2022 0904     Risk Assessment/Calculations:          Physical Exam:    VS:  BP (!) 140/80 (  BP Location: Right Arm, Patient Position: Sitting, Cuff Size: Normal)   Pulse (!) 53   Ht '5\' 10"'$  (1.778 m)   Wt 149 lb 3.2 oz (67.7 kg)   SpO2 99%   BMI 21.41 kg/m     Wt Readings from Last 3 Encounters:  05/15/22 149 lb 3.2 oz (67.7 kg)  05/10/22 148 lb 12.8 oz (67.5 kg)  03/22/22 155 lb (70.3 kg)     GEN:  Well nourished, well developed in no acute distress HEENT: Normal NECK: No JVD; No carotid bruits LYMPHATICS: No lymphadenopathy CARDIAC: RRR, no murmurs, rubs, gallops RESPIRATORY:  Clear to auscultation without rales, wheezing or rhonchi  ABDOMEN: Soft, non-tender, non-distended MUSCULOSKELETAL:  No edema; No deformity  SKIN: Warm and dry NEUROLOGIC:  Alert and oriented x 3 PSYCHIATRIC:  Normal affect   ASSESSMENT:    1. Precordial pain   2. Coronary artery calcification   3. Pure hypercholesterolemia   4. Primary hypertension    PLAN:    In order of problems listed above:  Chest pain, history of coronary calcifications, get echocardiogram, get coronary CTA.  Gastritis/GERD could be contributing. Coronary calcifications, echo and coronary CTA as above.  Consider baby aspirin 81 mg based on coronary CTA results.  Obtain fasting lipid profile.  Continue Crestor 5 mg daily. Hyperlipidemia, continue Crestor 5 mg, obtain fasting lipid profile. Hypertension, BP elevated, usually  controlled.  Continue Norvasc 10 mg daily,  Follow-up after echo and coronary CTA.     Medication Adjustments/Labs and Tests Ordered: Current medicines are reviewed at length with the patient today.  Concerns regarding medicines are outlined above.  Orders Placed This Encounter  Procedures   CT CORONARY MORPH W/CTA COR W/SCORE W/CA W/CM &/OR WO/CM   Lipid panel   Basic metabolic panel   EKG [id number]   ECHOCARDIOGRAM COMPLETE   Meds ordered this encounter  Medications   metoprolol tartrate (LOPRESSOR) 50 MG tablet    Sig: Take 1 tablet (50 mg) 2 hours prior to Cardiac CTA    Dispense:  1 tablet    Refill:  0    Patient Instructions  Medication Instructions:  Your physician recommends that you continue on your current medications as directed. Please refer to the Current Medication list given to you today.  A one time dose of Metoprolol 50 mg to be taken 2 hours prior to your Cardiac CTA has been sent to your pharmacy.  *If you need a refill on your cardiac medications before your next appointment, please call your pharmacy*   Lab Work: Your physician recommends that you return for a FASTING lipid profile and bmp:  Please have your lab drawn at the [facility]. No appt needed. Lab hours are Mon-Fri 7am-6pm. Stop at the Registration desk to check in.  If you have labs (blood work) drawn today and your tests are completely normal, you will receive your results only by: [place] (if you have MyChart) OR A paper copy in the mail If you have any lab test that is abnormal or we need to change your treatment, we will call you to review the results.   Testing/Procedures: Your physician has requested that you have an echocardiogram. Echocardiography is a painless test that uses sound waves to create images of your heart. It provides your doctor with information about the size and shape of your heart and how well your heart's chambers and valves are working. This procedure takes  approximately one hour. There are no restrictions for this  procedure.  Your physician has requested that you have cardiac CT. Cardiac computed tomography (CT) is a painless test that uses an x-ray machine to take clear, detailed pictures of your heart. For further information please visit HugeFiesta.[email or web address]. Please follow instruction sheet as given.    Follow-Up: At [facility], you and your health needs are our priority.  As part of our continuing mission to provide you with exceptional heart care, we have created designated Provider Care Teams.  These Care Teams include your primary Cardiologist (physician) and Advanced Practice Providers (APPs -  Physician Assistants and Nurse Practitioners) who all work together to provide you with the care you need, when you need it.  We recommend signing up for the patient portal called "MyChart".  Sign up information is provided on this After Visit Summary.  MyChart is used to connect with patients for Virtual Visits (Telemedicine).  Patients are able to view lab/test results, encounter notes, upcoming appointments, etc.  Non-urgent messages can be sent to your provider as well.   To learn more about what you can do with MyChart, go to [email or web address].    Your next appointment:   After testing   The format for your next appointment:   In Person  Provider:   You may see Kate Sable, MD or one of the following Advanced Practice Providers on your designated Care Team:   Murray Hodgkins, NP Christell Faith, PA-C Cadence Kathlen Mody, [place]   Other Instructions Your physician has requested that you have cardiac CT. Cardiac computed tomography (CT) is a painless test that uses an x-ray machine to take clear, detailed pictures of your heart.    Your cardiac CT will be scheduled at:  [facility] [address] [place], [place] [address] [phone]  Please arrive 15 mins early for check-in and test  prep.  Your test is scheduled on Thurs 05/18/22 @ 12:30pm  Please follow these instructions carefully (unless otherwise directed):  Hold all erectile dysfunction medications at least 3 days (72 hrs) prior to test.  On the Night Before the Test: Be sure to Drink plenty of water. Do not consume any caffeinated/decaffeinated beverages or chocolate 12 hours prior to your test.  On the Day of the Test: Drink plenty of water until 1 hour prior to the test. Do not eat any food 4 hours prior to the test. You may take your regular medications prior to the test.  Take metoprolol (Lopressor) two hours prior to test.        After the Test: Drink plenty of water. After receiving IV contrast, you may experience a mild flushed feeling. This is normal. On occasion, you may experience a mild rash up to 24 hours after the test. This is not dangerous. If this occurs, you can take Benadryl 25 mg and increase your fluid intake. If you experience trouble breathing, this can be serious. If it is severe call 911 IMMEDIATELY. If it is mild, please call our office. If you take any of these medications: Glipizide/Metformin, Avandament, Glucavance, please do not take 48 hours after completing test unless otherwise instructed.  Please allow 2-4 weeks for scheduling of routine cardiac CTs. Some insurance companies require a pre-authorization which may delay scheduling of this test.   For non-scheduling related questions, please contact the cardiac imaging nurse navigator should you have any questions/concerns: Marchia Bond, Cardiac Imaging Nurse Navigator Gordy Clement, Cardiac Imaging Nurse Navigator  [facility] Direct Office Dial: [phone]  For scheduling needs, including cancellations and rescheduling, please call [place], [phone].    Important Information About Sugar         Signed, Kate Sable, MD  05/15/2022 2:51 PM    [place]

## 2022-05-17 ENCOUNTER — Telehealth (HOSPITAL_COMMUNITY): Payer: Self-pay | Admitting: Emergency Medicine

## 2022-05-17 NOTE — Telephone Encounter (Signed)
Reaching out to patient to offer assistance regarding upcoming cardiac imaging study; pt verbalizes understanding of appt date/time, parking situation and where to check in, pre-test NPO status and medications ordered, and verified current allergies; name and call back number provided for further questions should they arise Marchia Bond RN Navigator Cardiac Imaging [facility] [phone] office [phone] cell  Arrival 1215 '50mg'$  metoprolol tartrate prescribed (pt states HR 55-65bpm) I suggested he take half.  Denies iv issues Aware of nitro

## 2022-05-18 ENCOUNTER — Other Ambulatory Visit: Payer: Self-pay | Admitting: Internal Medicine

## 2022-05-18 ENCOUNTER — Ambulatory Visit
Admission: RE | Admit: 2022-05-18 | Discharge: 2022-05-18 | Disposition: A | Discharge status: Home / Self Care | Payer: Medicare Other | Source: Ambulatory Visit | Attending: Cardiology | Admitting: Cardiology

## 2022-05-18 DIAGNOSIS — I2584 Coronary atherosclerosis due to calcified coronary lesion: Secondary | ICD-10-CM | POA: Diagnosis not present

## 2022-05-18 DIAGNOSIS — R072 Precordial pain: Secondary | ICD-10-CM | POA: Diagnosis not present

## 2022-05-18 DIAGNOSIS — I2510 Atherosclerotic heart disease of native coronary artery without angina pectoris: Secondary | ICD-10-CM | POA: Diagnosis not present

## 2022-05-18 MED ORDER — IOHEXOL 350 MG/ML SOLN
75.0000 mL | Freq: Once | INTRAVENOUS | Status: AC | PRN
  Administered 2022-05-18: 75 mL via INTRAVENOUS

## 2022-05-18 MED ORDER — NITROGLYCERIN 0.4 MG SL SUBL
0.8000 mg | SUBLINGUAL_TABLET | Freq: Once | SUBLINGUAL | Status: AC
  Administered 2022-05-18: 0.8 mg via SUBLINGUAL

## 2022-05-18 NOTE — Progress Notes (Signed)
Patient tolerated CT well. Drank water after. Vital signs stable encourage to drink water throughout day.Reasons explained and verbalized understanding. Ambulated steady gait.  

## 2022-05-19 ENCOUNTER — Telehealth: Payer: Self-pay | Admitting: *Deleted

## 2022-05-19 MED ORDER — ROSUVASTATIN CALCIUM 10 MG PO TABS: 10.0000 mg | ORAL_TABLET | Freq: Every day | ORAL | 0 refills | Status: DC

## 2022-05-19 NOTE — Telephone Encounter (Signed)
Pt notified of CCTA results and Dr. Thereasa Solo recc.  Pt voiced understanding. Pt will incr Crestor to 10 mg daily and will start otc ASA 81 mg daily.  New Rx sent to pt's pharmacy and he will double current dose of Crestor 5 mg until picks up new Rx.  Pt will follow up as scheduled and has no further questions at this time.

## 2022-05-19 NOTE — Telephone Encounter (Signed)
-----   Message from Kate Sable, MD sent at 05/18/2022  5:29 PM EDT ----- Mild nonobstructive coronary artery disease noted.  Recommend starting aspirin 81 mg daily, increase Crestor to 10 mg daily.

## 2022-05-20 ENCOUNTER — Other Ambulatory Visit: Payer: Self-pay | Admitting: Internal Medicine

## 2022-05-25 ENCOUNTER — Other Ambulatory Visit: Payer: Self-pay

## 2022-05-25 ENCOUNTER — Ambulatory Visit: Payer: Medicare Other | Admitting: Internal Medicine

## 2022-05-25 ENCOUNTER — Telehealth: Payer: Self-pay | Admitting: Internal Medicine

## 2022-05-25 ENCOUNTER — Emergency Department: Payer: Medicare Other

## 2022-05-25 ENCOUNTER — Emergency Department
Admission: EM | Admit: 2022-05-25 | Discharge: 2022-05-25 | Disposition: A | Discharge status: Home / Self Care | Payer: Medicare Other | Attending: Emergency Medicine | Admitting: Emergency Medicine

## 2022-05-25 ENCOUNTER — Ambulatory Visit
Admission: EM | Admit: 2022-05-25 | Discharge: 2022-05-25 | Disposition: A | Discharge status: Home / Self Care | Payer: Medicare Other | Attending: Emergency Medicine | Admitting: Emergency Medicine

## 2022-05-25 DIAGNOSIS — R0602 Shortness of breath: Secondary | ICD-10-CM | POA: Diagnosis not present

## 2022-05-25 DIAGNOSIS — R9431 Abnormal electrocardiogram [ECG] [EKG]: Secondary | ICD-10-CM

## 2022-05-25 DIAGNOSIS — J45909 Unspecified asthma, uncomplicated: Secondary | ICD-10-CM | POA: Diagnosis not present

## 2022-05-25 DIAGNOSIS — I10 Essential (primary) hypertension: Secondary | ICD-10-CM | POA: Diagnosis not present

## 2022-05-25 DIAGNOSIS — R0789 Other chest pain: Secondary | ICD-10-CM | POA: Diagnosis not present

## 2022-05-25 DIAGNOSIS — R079 Chest pain, unspecified: Secondary | ICD-10-CM | POA: Diagnosis not present

## 2022-05-25 LAB — CBC WITH DIFFERENTIAL/PLATELET
Abs Immature Granulocytes: 0.01 10*3/uL (ref 0.00–0.07)
Basophils Absolute: 0 10*3/uL (ref 0.0–0.1)
Basophils Relative: 1 %
Eosinophils Absolute: 0.1 10*3/uL (ref 0.0–0.5)
Eosinophils Relative: 4 %
HCT: 45.7 % (ref 39.0–52.0)
Hemoglobin: 13.9 g/dL (ref 13.0–17.0)
Immature Granulocytes: 0 %
Lymphocytes Relative: 40 %
Lymphs Abs: 1.3 10*3/uL (ref 0.7–4.0)
MCH: 27 pg (ref 26.0–34.0)
MCHC: 30.4 g/dL (ref 30.0–36.0)
MCV: 88.9 fL (ref 80.0–100.0)
Monocytes Absolute: 0.3 10*3/uL (ref 0.1–1.0)
Monocytes Relative: 9 %
Neutro Abs: 1.5 10*3/uL — ABNORMAL LOW (ref 1.7–7.7)
Neutrophils Relative %: 46 %
Platelets: 125 10*3/uL — ABNORMAL LOW (ref 150–400)
RBC: 5.14 MIL/uL (ref 4.22–5.81)
RDW: 13.4 % (ref 11.5–15.5)
WBC: 3.2 10*3/uL — ABNORMAL LOW (ref 4.0–10.5)
nRBC: 0 % (ref 0.0–0.2)

## 2022-05-25 LAB — TROPONIN I (HIGH SENSITIVITY)
Troponin I (High Sensitivity): 7 ng/L (ref <18)
Troponin I (High Sensitivity): 8 ng/L (ref <18)

## 2022-05-25 LAB — COMPREHENSIVE METABOLIC PANEL
ALT: 21 U/L (ref 0–44)
AST: 29 U/L (ref 15–41)
Albumin: 4.3 g/dL (ref 3.5–5.0)
Alkaline Phosphatase: 65 U/L (ref 38–126)
Anion gap: 7 (ref 5–15)
BUN: 14 mg/dL (ref 8–23)
CO2: 30 mmol/L (ref 22–32)
Calcium: 9.9 mg/dL (ref 8.9–10.3)
Chloride: 102 mmol/L (ref 98–111)
Creatinine, Ser: 1.23 mg/dL (ref 0.61–1.24)
GFR, Estimated: 59 mL/min — ABNORMAL LOW (ref >60)
Glucose, Bld: 80 mg/dL (ref 70–99)
Potassium: 3.9 mmol/L (ref 3.5–5.1)
Sodium: 139 mmol/L (ref 135–145)
Total Bilirubin: 0.8 mg/dL (ref 0.3–1.2)
Total Protein: 7.2 g/dL (ref 6.5–8.1)

## 2022-05-25 MED ORDER — ASPIRIN 81 MG PO CHEW
324.0000 mg | CHEWABLE_TABLET | Freq: Once | ORAL | Status: AC
  Administered 2022-05-25: 324 mg via ORAL

## 2022-05-25 MED ORDER — PREDNISONE 10 MG (21) PO TBPK: ORAL_TABLET | ORAL | 0 refills | Status: DC

## 2022-05-25 MED ORDER — NITROGLYCERIN 0.4 MG SL SUBL: 0.4000 mg | SUBLINGUAL_TABLET | SUBLINGUAL | Status: DC | PRN

## 2022-05-25 NOTE — ED Provider Notes (Signed)
[facility] Provider Note    Event Date/Time   First MD Initiated Contact with Patient 05/25/22 2307     (approximate)   History   Chest pain   HPI  Jose Perry is a 81 y.o. male  who presents to the emergency department today because of concern for abnormal EKG obtained at [facility]. Patient went to urgent care earlier today because of concern for possible asthma attack. He woke up this morning with chest tightness and wheezing. As the day has gone on the patient feels like the wheezing has improved. The patient did use his inhalers at home. The chest tightness persisted longer. At urgent care EKG was performed which was concerning for ST elevation lead V3.        Physical Exam   Triage Vital Signs: ED Triage Vitals  Enc Vitals Group     BP 05/25/22 1728 (!) 153/99     Pulse Rate 05/25/22 1728 (!) 58     Resp 05/25/22 1728 18     Temp 05/25/22 1728 97.6 F (36.4 C)     Temp Source 05/25/22 1728 Oral     SpO2 05/25/22 1728 100 %     Weight --      Height --      Head Circumference --      Peak Flow --      Pain Score 05/25/22 1729 0     Pain Loc --      Pain Edu? --      Excl. in [place]? --     Most recent vital signs: Vitals:   05/25/22 2046 05/25/22 2230  BP: (!) 166/90 (!) 154/83  Pulse: (!) 53 (!) 50  Resp: 16 13  Temp: 98 F (36.7 C)   SpO2: 94% 98%    General: Awake, alert, oriented. CV:  Good peripheral perfusion. Bradycardia. Resp:  Normal effort. No wheezing. No crackles. Abd:  No distention.    ED Results / Procedures / Treatments   Labs (all labs ordered are listed, but only abnormal results are displayed) Labs Reviewed  CBC WITH DIFFERENTIAL/PLATELET - Abnormal; Notable for the following components:      Result Value   WBC 3.2 (*)    Platelets 125 (*)    Neutro Abs 1.5 (*)    All other components within normal limits  COMPREHENSIVE METABOLIC PANEL - Abnormal; Notable for the following components:   GFR, Estimated 59  (*)    All other components within normal limits  TROPONIN I (HIGH SENSITIVITY)  TROPONIN I (HIGH SENSITIVITY)     EKG  I, Nance Pear, attending physician, personally viewed and interpreted this EKG  EKG Time: 1725 Rate: 59 Rhythm: sinus bradycardia  Axis: normal Intervals: qtc 374 QRS: narrow ST changes: no st elevation Impression: abnormal ekg   RADIOLOGY I independently interpreted and visualized the CXR. My interpretation: No pneumonia. No pneumothorax. Radiology interpretation:  IMPRESSION:  No active cardiopulmonary disease.     PROCEDURES:  Critical Care performed: No  Procedures   MEDICATIONS ORDERED IN ED: Medications - No data to display   IMPRESSION / MDM / [place] / ED COURSE  I reviewed the triage vital signs and the nursing notes.                              Differential diagnosis includes, but is not limited to, pneumonia, pneumothorax, asthma, ACS.  Patient's presentation  is most consistent with acute presentation with potential threat to life or bodily function.  Patient presents to the emergency department today because of concern for abnormal EKG seen at [facility]. EKG here does not show the ST elevation seen at urgent care. I was able to visualize the EKG taken at [facility] and while it did have some elevation in V3 there was no other elevation nor was there depression. Troponin was checked and was negative x 2. CXR did not show any pneumonia. At this time doubt ACS. Think likely asthma exacerbation. Patient without any wheezing here. Will plan on discharging with prescription for steroids.    FINAL CLINICAL IMPRESSION(S) / ED DIAGNOSES   Final diagnoses:  Chest tightness     Note:  This document was prepared using Dragon voice recognition software and may include unintentional dictation errors.    Nance Pear, MD 05/25/22 [phone]

## 2022-05-25 NOTE — Discharge Instructions (Signed)
Please seek medical attention for any high fevers, chest pain, shortness of breath, change in behavior, persistent vomiting, bloody stool or any other new or concerning symptoms.  

## 2022-05-25 NOTE — ED Triage Notes (Signed)
Patient to urgent care with complaints of worsening chest tightness and shortness of breath. States he has been using his prescribed inhalers without relief in symptoms. States he has been wheezing. Denies chest pain. Previously evaluated for the same.

## 2022-05-25 NOTE — ED Notes (Addendum)
Patient is being discharged from the Urgent Care and sent to the Emergency Department via [facility]. Per Barkley Boards, NP, patient is in need of higher level of care due to EKG changes/ ST elevation in v3, shortness of breath, and chest tightness. Patient is aware and verbalizes understanding of plan of care.  Vitals:   05/25/22 1626  BP: (!) 179/84  Pulse: 71  Resp: 18  Temp: 98.2 F (36.8 C)  SpO2: 97%

## 2022-05-25 NOTE — ED Provider Notes (Signed)
Jose Perry    CSN: [id number] Arrival date & time: 05/25/22  1622      History   Chief Complaint Chief Complaint  Patient presents with   Shortness of Breath    HPI Jose Perry is a 81 y.o. male.  Patient presents with 2-week history of chest tightness and shortness of breath which is worse today.  He also reports intermittent wheezing but none currently.  He denies focal weakness, numbness, dizziness, nausea, vomiting, or other symptoms.  Treatment at home with albuterol inhaler.  His medical history includes asthma, hypertension, GERD, gastrointestinal ulcer, glaucoma.  The history is provided by the patient and medical records.    Past Medical History:  Diagnosis Date   Asthma    Dyspnea    Elevated blood pressure    Enlarged prostate    GERD (gastroesophageal reflux disease)    Glaucoma    History of adenomatous polyp of colon    Hx of colonic polyps    Hypertension    Leukopenia    has had an extensive w/up.     Liver hemangioma 01/24/2018   Thyroid goiter     Patient Active Problem List   Diagnosis Date Noted   Diarrhea 10/30/2021   Iron deficiency 05/05/2020   Hyperplasia of prostate with lower urinary tract symptoms (LUTS) 07/10/2018   Exotropia 07/10/2018   Blepharoconjunctivitis 07/10/2018   Primary open angle glaucoma (POAG) 06/28/2018   B12 deficiency 05/14/2018   Liver hemangioma 01/24/2018   Thrombocytopenia ([place]) 12/19/2017   Change in stool 10/12/2017   Asthma 08/30/2017   Urinary urgency 05/23/2017   Thyroid nodule 06/13/2016   Primary open angle glaucoma (POAG) of right eye, moderate stage 04/10/2016   Shortness of breath 08/30/2015   Bruit 06/14/2015   Health care maintenance 12/13/2014   Glaucoma 09/21/2014   Goiter 06/28/2014   Hypertension 06/28/2014   Leukopenia 06/28/2014   History of colonic polyps 06/28/2014   Enlarged prostate 06/28/2014   GERD (gastroesophageal reflux disease) 06/28/2014   History of  adenomatous polyp of colon 06/26/2014   Gastrointestinal ulcer due to Helicobacter pylori 28/36/6294   Male erectile dysfunction, unspecified 07/24/2012   Incomplete emptying of bladder 07/24/2012   Elevated prostate specific antigen (PSA) 07/24/2012   Benign localized hyperplasia of prostate with urinary obstruction 07/24/2012    Past Surgical History:  Procedure Laterality Date   BACK SURGERY  2002   ruptured disc   CATARACT EXTRACTION W/PHACO Right 06/30/2020   Procedure: CATARACT EXTRACTION PHACO AND INTRAOCULAR LENS PLACEMENT (IOC) RIGHT, [place] 7.19 01:30.8 7.9%;  Surgeon: Leandrew Koyanagi, MD;  Location: [place];  Service: Ophthalmology;  Laterality: Right;   COLONOSCOPY W/ POLYPECTOMY  04/24/2005, 07/06/2010, 07/08/2014   COLONOSCOPY WITH PROPOFOL N/A 02/01/2018   Procedure: COLONOSCOPY WITH PROPOFOL;  Surgeon: Manya Silvas, MD;  Location[facility] ENDOSCOPY;  Service: Endoscopy;  Laterality: N/A;   ESOPHAGOGASTRODUODENOSCOPY  04/24/2005, 07/06/2010,   ESOPHAGOGASTRODUODENOSCOPY (EGD) WITH PROPOFOL N/A 05/10/2022   Procedure: ESOPHAGOGASTRODUODENOSCOPY (EGD) WITH PROPOFOL;  Surgeon: Toledo, Benay Pike, MD;  Location: [facility] ENDOSCOPY;  Service: Gastroenterology;  Laterality: N/A;   EYE SURGERY  2009   relieve pressure from [place]   goiter       Home Medications    Prior to Admission medications   Medication Sig Start Date End Date Taking? Authorizing Provider  albuterol (VENTOLIN HFA) 108 (90 Base) MCG/ACT inhaler INHALE 2 PUFFS BY MOUTH EVERY 6 HOURS AS NEEDED 09/09/21  Einar Pheasant, MD  amLODipine (NORVASC) 10 MG tablet TAKE 1 TABLET BY MOUTH EVERY DAY 04/23/22   Dutch Quint B, FNP  amLODipine (NORVASC) 5 MG tablet TAKE 1 TABLET (5 MG TOTAL) BY MOUTH DAILY. 05/21/22   Kennyth Arnold, FNP  BREO ELLIPTA 100-25 MCG/INH AEPB TAKE 1 PUFF BY MOUTH EVERY DAY 06/06/21   Einar Pheasant, MD  ferrous sulfate 325 (65 FE) MG EC tablet Take 325 mg  by mouth daily.    [provider]  latanoprost (XALATAN) 0.005 % ophthalmic solution ADMINISTER 1 DROP TO THE RIGHT EYE NIGHTLY. 03/07/19   [provider]  metoprolol tartrate (LOPRESSOR) 50 MG tablet Take 1 tablet (50 mg) 2 hours prior to Cardiac CTA 05/15/22   Kate Sable, MD  olmesartan (BENICAR) 5 MG tablet Take 5 mg by mouth daily. 05/14/22   [provider]  omeprazole (PRILOSEC) 40 MG capsule TAKE 1 CAPSULE (40 MG TOTAL) BY MOUTH DAILY. 05/13/22   Kennyth Arnold, FNP  rosuvastatin (CRESTOR) 10 MG tablet Take 1 tablet (10 mg total) by mouth daily. 05/19/22 08/17/22  Kate Sable, MD  sucralfate (CARAFATE) 1 g tablet TAKE 1 TABLET (1 G TOTAL) BY MOUTH 4 TIMES A DAY WITH MEALS AND AT BEDTIME 05/18/22   Einar Pheasant, MD  tamsulosin (FLOMAX) 0.4 MG CAPS capsule Take 1 capsule (0.4 mg total) by mouth daily. 07/15/21   Stoioff, Ronda Fairly, MD  timolol (TIMOPTIC) 0.5 % ophthalmic solution Place 1 drop into the right eye 2 (two) times daily. 03/09/22   [provider]  vitamin B-12 (CYANOCOBALAMIN) 1000 MCG tablet Take by mouth.    [provider]    Family History Family History  Problem Relation Age of Onset   Heart disease Mother    Alcohol abuse Father    Hyperlipidemia Father    Stroke Father     Social History Social History   Tobacco Use   Smoking status: Never   Smokeless tobacco: Never  Vaping Use   Vaping Use: Never used  Substance Use Topics   Alcohol use: No    Alcohol/week: 0.0 standard drinks of alcohol   Drug use: No     Allergies   Brimonidine, Brimonidine tartrate, Brinzolamide-brimonidine, and Other   Review of Systems Review of Systems  Constitutional:  Negative for chills and fever.  Respiratory:  Positive for chest tightness and shortness of breath. Negative for cough.   Cardiovascular:  Negative for chest pain and palpitations.  Gastrointestinal:  Negative for nausea and vomiting.  Neurological:   Negative for dizziness, facial asymmetry, weakness and numbness.  All other systems reviewed and are negative.    Physical Exam Triage Vital Signs ED Triage Vitals  Enc Vitals Group     BP      Pulse      Resp      Temp      Temp src      SpO2      Weight      Height      Head Circumference      Peak Flow      Pain Score      Pain Loc      Pain Edu?      Excl. in [place]?    No data found.  Updated Vital Signs BP (!) 175/87   Pulse 64   Temp 98.2 F (36.8 C)   Resp 18   Ht '5\' 10"'$  (1.778 m)   Wt 149 lb 3.2  oz (67.7 kg)   SpO2 97%   BMI 21.41 kg/m   Visual Acuity Right Eye Distance:   Left Eye Distance:   Bilateral Distance:    Right Eye Near:   Left Eye Near:    Bilateral Near:     Physical Exam Vitals and nursing note reviewed.  Constitutional:      General: He is not in acute distress.    Appearance: Normal appearance. He is well-developed. He is not ill-appearing.  HENT:     Mouth/Throat:     Mouth: Mucous membranes are moist.  Cardiovascular:     Rate and Rhythm: Normal rate and regular rhythm.     Heart sounds: Normal heart sounds.  Pulmonary:     Effort: Pulmonary effort is normal. No respiratory distress.     Breath sounds: Normal breath sounds. No wheezing.  Musculoskeletal:     Cervical back: Neck supple.  Skin:    General: Skin is warm and dry.  Neurological:     General: No focal deficit present.     Mental Status: He is alert and oriented to person, place, and time.     Sensory: No sensory deficit.     Motor: No weakness.     Gait: Gait normal.  Psychiatric:        Mood and Affect: Mood normal.        Behavior: Behavior normal.      UC Treatments / Results  Labs (all labs ordered are listed, but only abnormal results are displayed) Labs Reviewed - No data to display  EKG   Radiology No results found.  Procedures Procedures (including critical care time)  Medications Ordered in UC Medications  nitroGLYCERIN (NITROSTAT)  SL tablet 0.4 mg (has no administration in time range)  aspirin chewable tablet 324 mg (324 mg Oral Given 05/25/22 1647)    Initial Impression / Assessment and Plan / UC Course  I have reviewed the triage vital signs and the nursing notes.  Pertinent labs & imaging results that were available during my care of the patient were reviewed by me and considered in my medical decision making (see chart for details).   Chest tightness, shortness of breath, abnormal EKG.  EKG shows sinus bradycardia, rate 59, 3 mm ST elevation in lead V3, compared to previous from 05/15/2022, ST elevation was not present on previous EKG.  Follow-up on consult with Dr. Ascencion Dike at [facility].  Sending patient to the ED via EMS.   Final Clinical Impressions(s) / UC Diagnoses   Final diagnoses:  Chest tightness  Shortness of breath  Abnormal EKG   Discharge Instructions   None    ED Prescriptions   None    PDMP not reviewed this encounter.   Sharion Balloon, NP 05/25/22 1649

## 2022-05-25 NOTE — ED Triage Notes (Signed)
Pt. To ED from UC via EMS. Pt. Had chest tightness with cough this AM. UC noticed ST elevation in V3. UC gave 324 ASA. Pt. Denies CP currently, states "I'm not breathing normal but it feels like my asthma."

## 2022-05-25 NOTE — ED Notes (Signed)
Ems at bedside.  Barkley Boards, NP gave report to ems

## 2022-05-25 NOTE — Telephone Encounter (Signed)
Just FYI. I called patient to triage & to see if planned on being seen by UC if not ED. Patient stated that he was feeling much better & that he wasn't going to be seen anywhere. He said that he no longer was having SOB or wheezing. He said that had plenty of medication to manage his asthma as well. I first advised that he needed to be seen regardless so someone could see & listen to patient. I advised that he please be seen ASAP if symptoms returned at least by walk-in maybe [facility] since next to ED or the ED. Pt verbalized understanding of this.

## 2022-05-25 NOTE — ED Notes (Signed)
Katharine Look, cma notified ems for transport

## 2022-05-25 NOTE — ED Provider Triage Note (Signed)
Emergency Medicine Provider Triage Evaluation Note  Jose Perry , a 81 y.o. male  was evaluated in triage.  Pt complains of asthma exacerbation.  Patient presented to the ED from urgent care via EMS for asthma exacerbation.  He is awoke this morning wheezing with chest tightness.  He states that he was able to mostly control the asthma with at home medications but went to urgent care for breathing treatment.  They did an EKG and noticed elevation in V3 and called EMS for transport.  Patient has no chest pain, states that most of his symptoms have improved but he would like a breathing treatment and steroids for his asthma..  Review of Systems  Positive: Shortness of breath, wheezing Negative: Chest pain, GI symptoms, fever, chills, URI symptoms  Physical Exam  BP (!) 153/99 (BP Location: Right Arm)   Pulse (!) 58   Temp 97.6 F (36.4 C) (Oral)   Resp 18   SpO2 100%  Gen:   Awake, no distress   Resp:  Normal effort.  Faint expiratory wheezing bilaterally MSK:   Moves extremities without difficulty  Other:    Medical Decision Making  Medically screening exam initiated at 5:41 PM.  Appropriate orders placed.  Jose Perry was informed that the remainder of the evaluation will be completed by another provider, this initial triage assessment does not replace that evaluation, and the importance of remaining in the ED until their evaluation is complete.  Patient presents today with EMS regarding care for elevation in V3.  There was no reported chest pain.   Patient will have labs, EKG, chest x-ray this time   Darletta Moll, PA-C 05/25/22 1741

## 2022-05-25 NOTE — Telephone Encounter (Signed)
Pt called in stating he is having asthmatic problems-wheezing and SOB. Sent to access nurse

## 2022-05-26 NOTE — Telephone Encounter (Signed)
Patient says he is feeling better, at ER he was prescribed course of prednisone , but feels he is not sick enough to start the prednisone right now, he says the biggest reason he went was due to wheezing yesterday but as the day went on he did not feel quite right he said he was having no chest pain or tightness it was just the wheezing and he said why he went. Today no wheezing today , no  shortness of breath or chest pain or tightness noted will keep appointment on 8/28/ 23 he would like referral to pulmonology for his asthma because he is now using inhalers more often and using them has caused elevation in his blood pressure. Patient says after using inhaler especially in the am before taking BP  meds BP will run 160/90 sometimes but comes down with medication to 120/80. Advised patient make sure to keep appointment and if BP elevated more than 160/90 or did not come down after medication and rest orif symptomatic he would need to be evaluated over the weekend , but during the week to call office back.

## 2022-05-26 NOTE — Telephone Encounter (Signed)
Per review, pt was seen at urgent care and then transferred to ER for evaluation of sob and chest tightness. Please call and f/u with him and  confirm doing ok. Please make sure he is aware that I am not in the office this week.  Need to confirm doing ok.

## 2022-05-29 DIAGNOSIS — J301 Allergic rhinitis due to pollen: Secondary | ICD-10-CM | POA: Diagnosis not present

## 2022-05-29 DIAGNOSIS — H6123 Impacted cerumen, bilateral: Secondary | ICD-10-CM | POA: Diagnosis not present

## 2022-06-13 ENCOUNTER — Ambulatory Visit: Payer: Medicare Other | Attending: Cardiology

## 2022-06-13 DIAGNOSIS — R072 Precordial pain: Secondary | ICD-10-CM | POA: Insufficient documentation

## 2022-06-14 ENCOUNTER — Other Ambulatory Visit: Payer: Self-pay | Admitting: Internal Medicine

## 2022-06-14 LAB — ECHOCARDIOGRAM COMPLETE
AR max vel: 2.71 cm2
AV Area VTI: 2.52 cm2
AV Area mean vel: 2.47 cm2
AV Mean grad: 3 mmHg
AV Peak grad: 6.2 mmHg
Ao pk vel: 1.24 m/s
Area-P 1/2: 5.42 cm2
S' Lateral: 2.5 cm

## 2022-06-16 ENCOUNTER — Ambulatory Visit (INDEPENDENT_AMBULATORY_CARE_PROVIDER_SITE_OTHER): Payer: Medicare Other | Admitting: Internal Medicine

## 2022-06-16 DIAGNOSIS — I2510 Atherosclerotic heart disease of native coronary artery without angina pectoris: Secondary | ICD-10-CM | POA: Diagnosis not present

## 2022-06-16 DIAGNOSIS — D1803 Hemangioma of intra-abdominal structures: Secondary | ICD-10-CM

## 2022-06-16 DIAGNOSIS — E041 Nontoxic single thyroid nodule: Secondary | ICD-10-CM | POA: Diagnosis not present

## 2022-06-16 DIAGNOSIS — Z23 Encounter for immunization: Secondary | ICD-10-CM | POA: Diagnosis not present

## 2022-06-16 DIAGNOSIS — D696 Thrombocytopenia, unspecified: Secondary | ICD-10-CM

## 2022-06-16 DIAGNOSIS — I2584 Coronary atherosclerosis due to calcified coronary lesion: Secondary | ICD-10-CM | POA: Diagnosis not present

## 2022-06-16 DIAGNOSIS — E611 Iron deficiency: Secondary | ICD-10-CM | POA: Diagnosis not present

## 2022-06-16 DIAGNOSIS — J4520 Mild intermittent asthma, uncomplicated: Secondary | ICD-10-CM | POA: Diagnosis not present

## 2022-06-16 DIAGNOSIS — I10 Essential (primary) hypertension: Secondary | ICD-10-CM

## 2022-06-16 DIAGNOSIS — D72819 Decreased white blood cell count, unspecified: Secondary | ICD-10-CM | POA: Diagnosis not present

## 2022-06-16 DIAGNOSIS — K219 Gastro-esophageal reflux disease without esophagitis: Secondary | ICD-10-CM

## 2022-06-16 DIAGNOSIS — R0602 Shortness of breath: Secondary | ICD-10-CM

## 2022-06-16 LAB — CBC WITH DIFFERENTIAL/PLATELET
Basophils Absolute: 0 10*3/uL (ref 0.0–0.1)
Basophils Relative: 1.1 % (ref 0.0–3.0)
Eosinophils Absolute: 0.2 10*3/uL (ref 0.0–0.7)
Eosinophils Relative: 6.6 % — ABNORMAL HIGH (ref 0.0–5.0)
HCT: 40.2 % (ref 39.0–52.0)
Hemoglobin: 13.2 g/dL (ref 13.0–17.0)
Lymphocytes Relative: 29.5 % (ref 12.0–46.0)
Lymphs Abs: 0.9 10*3/uL (ref 0.7–4.0)
MCHC: 32.8 g/dL (ref 30.0–36.0)
MCV: 86 fl (ref 78.0–100.0)
Monocytes Absolute: 0.2 10*3/uL (ref 0.1–1.0)
Monocytes Relative: 7.4 % (ref 3.0–12.0)
Neutro Abs: 1.7 10*3/uL (ref 1.4–7.7)
Neutrophils Relative %: 55.4 % (ref 43.0–77.0)
Platelets: 128 10*3/uL — ABNORMAL LOW (ref 150.0–400.0)
RBC: 4.68 Mil/uL (ref 4.22–5.81)
RDW: 14.8 % (ref 11.5–15.5)
WBC: 3 10*3/uL — ABNORMAL LOW (ref 4.0–10.5)

## 2022-06-16 LAB — BASIC METABOLIC PANEL
BUN: 13 mg/dL (ref 6–23)
CO2: 30 mEq/L (ref 19–32)
Calcium: 9.9 mg/dL (ref 8.4–10.5)
Chloride: 103 mEq/L (ref 96–112)
Creatinine, Ser: 1.38 mg/dL (ref 0.40–1.50)
GFR: 48.34 mL/min — ABNORMAL LOW (ref >60.00)
Glucose, Bld: 78 mg/dL (ref 70–99)
Potassium: 3.8 mEq/L (ref 3.5–5.1)
Sodium: 140 mEq/L (ref 135–145)

## 2022-06-16 LAB — TSH: TSH: 1.21 u[IU]/mL (ref 0.35–5.50)

## 2022-06-16 LAB — HEPATIC FUNCTION PANEL
ALT: 17 U/L (ref 0–53)
AST: 23 U/L (ref 0–37)
Albumin: 4.1 g/dL (ref 3.5–5.2)
Alkaline Phosphatase: 75 U/L (ref 39–117)
Bilirubin, Direct: 0.1 mg/dL (ref 0.0–0.3)
Total Bilirubin: 0.6 mg/dL (ref 0.2–1.2)
Total Protein: 6.6 g/dL (ref 6.0–8.3)

## 2022-06-16 NOTE — Progress Notes (Unsigned)
Patient ID: Jose Perry, male   DOB: Dec 11, 1940, 81 y.o.   MRN: [id number]   Subjective:    Patient ID: Jose Perry, male    DOB: [birth year], 81 y.o.   MRN: [id number]   Patient here for No chief complaint on file.  Marland Kitchen   HPI Here to follow up regarding his blood pressure and asthma.  Was seen in ER 05/25/22.  Had presented to UC - concerns regarding possible asthma attack - chest tightness and wheezing. EKG at [facility] - referred over to ER. Troponin negative x 2.  CXR - no pneumonia.  Was given steroids.  He has seen cardiology.  Had CTA - mild nonobstructive CAD.  Crestor increased to '10mg'$  and started on aspirin.  Previous EGD mild gastritis.  On omeprazole.    Past Medical History:  Diagnosis Date   Asthma    Dyspnea    Elevated blood pressure    Enlarged prostate    GERD (gastroesophageal reflux disease)    Glaucoma    History of adenomatous polyp of colon    Hx of colonic polyps    Hypertension    Leukopenia    has had an extensive w/up.     Liver hemangioma 01/24/2018   Thyroid goiter    Past Surgical History:  Procedure Laterality Date   BACK SURGERY  2002   ruptured disc   CATARACT EXTRACTION W/PHACO Right 06/30/2020   Procedure: CATARACT EXTRACTION PHACO AND INTRAOCULAR LENS PLACEMENT (IOC) RIGHT, Malyugin 7.19 01:30.8 7.9%;  Surgeon: Leandrew Koyanagi, MD;  Location: [facility];  Service: Ophthalmology;  Laterality: Right;   COLONOSCOPY W/ POLYPECTOMY  04/24/2005, 07/06/2010, 07/08/2014   COLONOSCOPY WITH PROPOFOL N/A 02/01/2018   Procedure: COLONOSCOPY WITH PROPOFOL;  Surgeon: Manya Silvas, MD;  Location: [facility] ENDOSCOPY;  Service: Endoscopy;  Laterality: N/A;   ESOPHAGOGASTRODUODENOSCOPY  04/24/2005, 07/06/2010,   ESOPHAGOGASTRODUODENOSCOPY (EGD) WITH PROPOFOL N/A 05/10/2022   Procedure: ESOPHAGOGASTRODUODENOSCOPY (EGD) WITH PROPOFOL;  Surgeon: Toledo, Benay Pike, MD;  Location: [facility] ENDOSCOPY;  Service: Gastroenterology;  Laterality: N/A;   EYE SURGERY  2009    relieve pressure from glaucoma   THYROID SURGERY  1968   goiter   Family History  Problem Relation Age of Onset   Heart disease Mother    Alcohol abuse Father    Hyperlipidemia Father    Stroke Father    Social History   Socioeconomic History   Marital status: Married    Spouse name: Not on file   Number of children: Not on file   Years of education: Not on file   Highest education level: Not on file  Occupational History   Not on file  Tobacco Use   Smoking status: Never   Smokeless tobacco: Never  Vaping Use   Vaping Use: Never used  Substance and Sexual Activity   Alcohol use: No    Alcohol/week: 0.0 standard drinks of alcohol   Drug use: No   Sexual activity: Yes  Other Topics Concern   Not on file  Social History Narrative   Not on file   Social Determinants of Health   Financial Resource Strain: Low Risk  (10/14/2021)   Overall Financial Resource Strain (CARDIA)    Difficulty of Paying Living Expenses: Not hard at all  Food Insecurity: No Food Insecurity (10/14/2021)   Hunger Vital Sign    Worried About Running Out of Food in the Last Year: Never true    [place] in the Last Year: Never  true  Transportation Needs: No Transportation Needs (10/14/2021)   PRAPARE - Hydrologist (Medical): No    Lack of Transportation (Non-Medical): No  Physical Activity: Sufficiently Active (10/14/2021)   Exercise Vital Sign    Days of Exercise per Week: 7 days    Minutes of Exercise per Session: 60 min  Stress: No Stress Concern Present (10/14/2021)   [place]    Feeling of Stress : Not at all  Social Connections: Unknown (10/14/2021)   Social Connection and Isolation Panel [NHANES]    Frequency of Communication with Friends and Family: Not on file    Frequency of Social Gatherings with Friends and Family: Not on file    Attends Religious Services: Not on file    Active Member of  Clubs or Organizations: Not on file    Attends Archivist Meetings: Not on file    Marital Status: Married     Review of Systems     Objective:     There were no vitals taken for this visit. Wt Readings from Last 3 Encounters:  05/25/22 149 lb 3.2 oz (67.7 kg)  05/15/22 149 lb 3.2 oz (67.7 kg)  05/10/22 148 lb 12.8 oz (67.5 kg)    Physical Exam   Outpatient Encounter Medications as of 06/16/2022  Medication Sig   albuterol (VENTOLIN HFA) 108 (90 Base) MCG/ACT inhaler INHALE 2 PUFFS BY MOUTH EVERY 6 HOURS AS NEEDED   amLODipine (NORVASC) 10 MG tablet TAKE 1 TABLET BY MOUTH EVERY DAY   amLODipine (NORVASC) 5 MG tablet TAKE 1 TABLET (5 MG TOTAL) BY MOUTH DAILY.   BREO ELLIPTA 100-25 MCG/INH AEPB TAKE 1 PUFF BY MOUTH EVERY DAY   ferrous sulfate 325 (65 FE) MG EC tablet Take 325 mg by mouth daily.   latanoprost (XALATAN) 0.005 % ophthalmic solution ADMINISTER 1 DROP TO THE RIGHT EYE NIGHTLY.   metoprolol tartrate (LOPRESSOR) 50 MG tablet Take 1 tablet (50 mg) 2 hours prior to Cardiac CTA   olmesartan (BENICAR) 5 MG tablet Take 5 mg by mouth daily.   omeprazole (PRILOSEC) 40 MG capsule TAKE 1 CAPSULE (40 MG TOTAL) BY MOUTH DAILY.   predniSONE (STERAPRED UNI-PAK 21 TAB) 10 MG (21) TBPK tablet Per packaging instructions   rosuvastatin (CRESTOR) 10 MG tablet Take 1 tablet (10 mg total) by mouth daily.   sucralfate (CARAFATE) 1 g tablet TAKE 1 TABLET (1 G TOTAL) BY MOUTH 4 TIMES A DAY WITH MEALS AND AT BEDTIME   tamsulosin (FLOMAX) 0.4 MG CAPS capsule Take 1 capsule (0.4 mg total) by mouth daily.   timolol (TIMOPTIC) 0.5 % ophthalmic solution Place 1 drop into the right eye 2 (two) times daily.   vitamin B-12 (CYANOCOBALAMIN) 1000 MCG tablet Take by mouth.   No facility-administered encounter medications on file as of 06/16/2022.     Lab Results  Component Value Date   WBC 3.2 (L) 05/25/2022   HGB 13.9 05/25/2022   HCT 45.7 05/25/2022   PLT 125 (L) 05/25/2022   GLUCOSE 80  05/25/2022   CHOL 151 05/15/2022   TRIG 36 05/15/2022   HDL 73 05/15/2022   LDLCALC 71 05/15/2022   ALT 21 05/25/2022   AST 29 05/25/2022   NA 139 05/25/2022   K 3.9 05/25/2022   CL 102 05/25/2022   CREATININE 1.23 05/25/2022   BUN 14 05/25/2022   CO2 30 05/25/2022   TSH 1.70 02/20/2022   PSA  2.86 07/11/2021    DG Chest 2 View  Result Date: 05/25/2022 CLINICAL DATA:  Asthma with chest tightness EXAM: CHEST - 2 VIEW COMPARISON:  Chest x-ray 03/15/2022 FINDINGS: The heart size and mediastinal contours are within normal limits. Both lungs are clear. The visualized skeletal structures are unremarkable. IMPRESSION: No active cardiopulmonary disease. Electronically Signed   By: Ronney Asters [name]   On: 05/25/2022 18:16       Assessment & Plan:   Problem List Items Addressed This Visit   None    Einar Pheasant, MD

## 2022-06-16 NOTE — Patient Instructions (Signed)
Pepcid (famotidine) - take 30 minutes before your evening meal

## 2022-06-17 ENCOUNTER — Encounter: Payer: Self-pay | Admitting: Internal Medicine

## 2022-06-17 NOTE — Assessment & Plan Note (Signed)
On prilosec.  Will continue prilosec in the am - 30 minutes before breakfast. Add pepcid 56mnutes before evening meal.  Follow.  See if improves breathing.

## 2022-06-17 NOTE — Assessment & Plan Note (Signed)
Just evaluated by endocrinology - 10/2021.  Recommended f/u thyroid ultrasound in one year.

## 2022-06-17 NOTE — Assessment & Plan Note (Signed)
Just saw GI 12/2021.  Felt to be benign.  Recommended no surveillance f/u.

## 2022-06-17 NOTE — Assessment & Plan Note (Signed)
Has been previously evaluated by hematology. Follow.

## 2022-06-17 NOTE — Assessment & Plan Note (Signed)
Follow cbc.  

## 2022-06-17 NOTE — Assessment & Plan Note (Signed)
On amlodipine and benicar.  Follow pressures.  Follow metabolic panel.  

## 2022-06-17 NOTE — Assessment & Plan Note (Signed)
Saw GI.  Recommended holding on EGD and colonoscopy.  Follow cbc and iron studies.  

## 2022-06-17 NOTE — Assessment & Plan Note (Signed)
Reports feeling like not getting a good breath at times.  Does regular exercise.  Did not feel breo helped.  Is using albuterol - bid.  Feels helps. Previous cxr - no acute process.  Recent chest CT - no acute extracardiac findings.  Mention of pericardial effusion.  Just saw cardiology with w/up as outlined.  Had CTA - mild non obstructive CAD.  Will treat acid reflux as outlined.  Restart breo bid.  Albuterol if needed. Recommended f/u with Dr Patsey Berthold. Discussed further pulmonary w/up - PFTs, etc.

## 2022-06-17 NOTE — Assessment & Plan Note (Signed)
Off breo.  Using albuterol as outlined.  No increased cough.  Treat acid reflux.  Restart breo.

## 2022-06-19 ENCOUNTER — Other Ambulatory Visit: Payer: Self-pay

## 2022-06-19 DIAGNOSIS — I10 Essential (primary) hypertension: Secondary | ICD-10-CM

## 2022-06-19 NOTE — Progress Notes (Signed)
Bmet ordered for recheck in 3-4 wks.

## 2022-06-21 NOTE — Progress Notes (Signed)
Cardiology Clinic Note   Patient Name: Jose Perry Date of Encounter: 06/23/2022  Primary Care Provider:  Einar Pheasant, MD Primary Cardiologist:  None  Patient Profile    81 year old male with a history of hypertension, hyperlipidemia, GERD, asthma, chronic dyspnea, who returns today to follow up on concerns for chest pain and shortness of breath.  Past Medical History    Past Medical History:  Diagnosis Date   Asthma    Dyspnea    Elevated blood pressure    Enlarged prostate    GERD (gastroesophageal reflux disease)    Glaucoma    History of adenomatous polyp of colon    Hx of colonic polyps    Hypertension    Leukopenia    has had an extensive w/up.     Liver hemangioma 01/24/2018   Thyroid goiter    Past Surgical History:  Procedure Laterality Date   BACK SURGERY  2002   ruptured disc   CATARACT EXTRACTION W/PHACO Right 06/30/2020   Procedure: CATARACT EXTRACTION PHACO AND INTRAOCULAR LENS PLACEMENT (IOC) RIGHT, Malyugin 7.19 01:30.8 7.9%;  Surgeon: Leandrew Koyanagi, MD;  Location: [facility];  Service: Ophthalmology;  Laterality: Right;   COLONOSCOPY W/ POLYPECTOMY  04/24/2005, 07/06/2010, 07/08/2014   COLONOSCOPY WITH PROPOFOL N/A 02/01/2018   Procedure: COLONOSCOPY WITH PROPOFOL;  Surgeon: Manya Silvas, MD;  Location: [facility] ENDOSCOPY;  Service: Endoscopy;  Laterality: N/A;   ESOPHAGOGASTRODUODENOSCOPY  04/24/2005, 07/06/2010,   ESOPHAGOGASTRODUODENOSCOPY (EGD) WITH PROPOFOL N/A 05/10/2022   Procedure: ESOPHAGOGASTRODUODENOSCOPY (EGD) WITH PROPOFOL;  Surgeon: Toledo, Benay Pike, MD;  Location: [facility] ENDOSCOPY;  Service: Gastroenterology;  Laterality: N/A;   EYE SURGERY  2009   relieve pressure from glaucoma   THYROID SURGERY  1968   goiter    Allergies  Allergies  Allergen Reactions   Brimonidine     Other reaction(s): Eye Redness   Brimonidine Tartrate     Other reaction(s): Eye redness   Brinzolamide-Brimonidine     Other reaction(s):  Eye redness   Other Other (See Comments)    Simbrinza Eye Drops gives pt Eye redness    History of Present Illness    81 year old male with a past medical history of hypertension, hyperlipidemia, gastroesophageal reflux disease, asthma, chronic dyspnea, who has been experiencing chest pain and shortness of breath.  He has complaints of ongoing chest discomfort for the past 4 months.  Symptoms are none associated with exertion.  He presented to the emergency department due to persistent chest pain but 3 months prior symptoms were atypical diagnosis possible GERD.  Followed up with gastroenterology who placed him on omeprazole and Carafate with some improvement in his symptoms.  Underwent EGD which showed mild gastritis and no other significant findings.  At that time his Carafate was stopped.  He underwent coronary CTA on 05/18/2022 which revealed no acute extracardiac findings small pericardial effusion, partially visualized left hepatic lobe pulm mass which was previous to CT from 07/06/2017.  Coronary calcium score was 553 which was in the 60 percentile for age and sex matched control, normal coronary origin with right dominance, calcified plaque in the proximal LAD, D1 and RCA causing mild stenosis (25-49%), mild nonobstructive CAD, recommend consider nonatherosclerotic causes of chest pain consider preventative therapy and risk factor modification.  05/25/2022 he was evaluated for urgent care in [facility] for shortness of breath that been persistent over 2 weeks with a history of chest tightness which she said was worse today.  He also reported  intermittent wheezing but none currently.  At urgent care he was noted to have changes in his EKG and was sent to the emergency department for further evaluation.  [facility] emergency department for chest tightness and wheezing thought to be possible asthma attack.  His EKG was performed which was concerning for ST elevation in V3, repeat EKG does not show any EKG  changes or elevations.  High-sensitivity troponins were checked and were negative x2, chest x-ray did not show any pneumonia.  ACS was ruled out this is likely an asthma exacerbation.  He was subsequently discharged with prescription for steroids.  On 06/13/2022 he underwent echocardiogram which revealed LVEF 55-60%, no regional wall motion abnormalities, left ventricular hypertrophy in the inferior lateral and apical segments, G2 DD, mildly elevated pulmonary artery systolic pressures mild mitral regurgitation was noted as well as aortic valve sclerosis without stenosis.  He returns to clinic today stating that he has been well. No other asthma attacks or GERD attacks. He was doing yard work yesterday and was stung by a bee on his right hand that remains swollen today. He has follow up with Pulmonary next month as well as.  With his previous testing that he had undergone he was advised to start taking aspirin and rosuvastatin which he has without any complaints.  He denies any recurrent chest pain shortness of breath or palpitations.  He also denies any recent hospitalizations or visits to the emergency department since he was last evaluated for an asthma attack.  Home Medications    Current Outpatient Medications  Medication Sig Dispense Refill   albuterol (VENTOLIN HFA) 108 (90 Base) MCG/ACT inhaler INHALE 2 PUFFS BY MOUTH EVERY 6 HOURS AS NEEDED 18 each 2   amLODipine (NORVASC) 10 MG tablet TAKE 1 TABLET BY MOUTH EVERY DAY 90 tablet 1   amLODipine (NORVASC) 5 MG tablet TAKE 1 TABLET (5 MG TOTAL) BY MOUTH DAILY. 90 tablet 1   BREO ELLIPTA 100-25 MCG/INH AEPB TAKE 1 PUFF BY MOUTH EVERY DAY 180 each 2   ferrous sulfate 325 (65 FE) MG EC tablet Take 325 mg by mouth daily.     latanoprost (XALATAN) 0.005 % ophthalmic solution ADMINISTER 1 DROP TO THE RIGHT EYE NIGHTLY.     metoprolol tartrate (LOPRESSOR) 50 MG tablet Take 1 tablet (50 mg) 2 hours prior to Cardiac CTA 1 tablet 0   olmesartan (BENICAR) 5  MG tablet Take 5 mg by mouth daily.     omeprazole (PRILOSEC) 40 MG capsule TAKE 1 CAPSULE (40 MG TOTAL) BY MOUTH DAILY. 90 capsule 1   rosuvastatin (CRESTOR) 10 MG tablet Take 1 tablet (10 mg total) by mouth daily. 90 tablet 0   sucralfate (CARAFATE) 1 g tablet TAKE 1 TABLET (1 G TOTAL) BY MOUTH 4 TIMES A DAY WITH MEALS AND AT BEDTIME 120 tablet 0   tamsulosin (FLOMAX) 0.4 MG CAPS capsule Take 1 capsule (0.4 mg total) by mouth daily. 90 capsule 3   timolol (TIMOPTIC) 0.5 % ophthalmic solution Place 1 drop into the right eye 2 (two) times daily.     vitamin B-12 (CYANOCOBALAMIN) 1000 MCG tablet Take by mouth.     No current facility-administered medications for this visit.     Family History    Family History  Problem Relation Age of Onset   Heart disease Mother    Alcohol abuse Father    Hyperlipidemia Father    Stroke Father    He indicated that his mother is deceased. He indicated that  his father is deceased.  Social History    Social History   Socioeconomic History   Marital status: Married    Spouse name: Not on file   Number of children: Not on file   Years of education: Not on file   Highest education level: Not on file  Occupational History   Not on file  Tobacco Use   Smoking status: Never   Smokeless tobacco: Never  Vaping Use   Vaping Use: Never used  Substance and Sexual Activity   Alcohol use: No    Alcohol/week: 0.0 standard drinks of alcohol   Drug use: No   Sexual activity: Yes  Other Topics Concern   Not on file  Social History Narrative   Not on file   Social Determinants of Health   Financial Resource Strain: Low Risk  (10/14/2021)   Overall Financial Resource Strain (CARDIA)    Difficulty of Paying Living Expenses: Not hard at all  Food Insecurity: No Food Insecurity (10/14/2021)   Hunger Vital Sign    Worried About Running Out of Food in the Last Year: Never true    [place] in the Last Year: Never true  Transportation Needs: No  Transportation Needs (10/14/2021)   PRAPARE - Hydrologist (Medical): No    Lack of Transportation (Non-Medical): No  Physical Activity: Sufficiently Active (10/14/2021)   Exercise Vital Sign    Days of Exercise per Week: 7 days    Minutes of Exercise per Session: 60 min  Stress: No Stress Concern Present (10/14/2021)   [place]    Feeling of Stress : Not at all  Social Connections: Unknown (10/14/2021)   Social Connection and Isolation Panel [NHANES]    Frequency of Communication with Friends and Family: Not on file    Frequency of Social Gatherings with Friends and Family: Not on file    Attends Religious Services: Not on file    Active Member of Clubs or Organizations: Not on file    Attends Archivist Meetings: Not on file    Marital Status: Married  Intimate Partner Violence: Not At Risk (10/14/2021)   Humiliation, Afraid, Rape, and Kick questionnaire    Fear of Current or Ex-Partner: No    Emotionally Abused: No    Physically Abused: No    Sexually Abused: No     Review of Systems    General:  No chills, fever, night sweats or weight changes.  Cardiovascular:  No chest pain, dyspnea on exertion, edema, orthopnea, palpitations, paroxysmal nocturnal dyspnea. Dermatological: No rash, lesions/masses Respiratory: No cough, dyspnea Urologic: No hematuria, dysuria Abdominal:   No nausea, vomiting, diarrhea, bright red blood per rectum, melena, or hematemesis Neurologic:  No visual changes, wkns, changes in mental status. All other systems reviewed and are otherwise negative except as noted above.     Physical Exam    VS:  BP (!) 140/82 (BP Location: Left Arm, Patient Position: Sitting, Cuff Size: Normal)   Pulse (!) 51   Ht '5\' 10"'$  (1.778 m)   Wt 151 lb 6 oz (68.7 kg)   SpO2 96%   BMI 21.72 kg/m  , BMI Body mass index is 21.72 kg/m.     Vitals:   06/23/22 1517 06/23/22  1530  BP: (!) 140/82 137/62    GEN: Well nourished, well developed, in no acute distress. HEENT: normal. Neck: Supple, no JVD, carotid bruits, or masses. Cardiac:  RRR, no murmurs, rubs, or gallops. No clubbing, cyanosis, edema.  Radials/DP/PT 2+ and equal bilaterally.  Respiratory:  Respirations regular and unlabored, clear to auscultation bilaterally. GI: Soft, nontender, nondistended, BS + x 4. MS: no deformity or atrophy. Skin: warm and dry, no rash. Neuro:  Strength and sensation are intact. Psych: Normal affect.  Accessory Clinical Findings    ECG personally reviewed by me today-sinus bradycardia with a rate of 51, LVH with early repolarization, and left axis deviation- No acute changes  Lab Results  Component Value Date   WBC 3.0 (L) 06/16/2022   HGB 13.2 06/16/2022   HCT 40.2 06/16/2022   MCV 86.0 06/16/2022   PLT 128.0 (L) 06/16/2022   Lab Results  Component Value Date   CREATININE 1.38 06/16/2022   BUN 13 06/16/2022   NA 140 06/16/2022   K 3.8 06/16/2022   CL 103 06/16/2022   CO2 30 06/16/2022   Lab Results  Component Value Date   ALT 17 06/16/2022   AST 23 06/16/2022   ALKPHOS 75 06/16/2022   BILITOT 0.6 06/16/2022   Lab Results  Component Value Date   CHOL 151 05/15/2022   HDL 73 05/15/2022   LDLCALC 71 05/15/2022   TRIG 36 05/15/2022   CHOLHDL 2.1 05/15/2022    No results found for: "HGBA1C"  Assessment & Plan   1.  Coronary calcifications coronary CT completed which shows coronary calcium score 553 which is 79 percentile for age and sex matched control.  Normal coronary origin with right dominance, calcified plaque in the proximal LAD, D1, and RCA causing mild stenosis (25-49%), which revealed nonobstructive CAD.  At that time he was started on aspirin 81 mg daily and rosuvastatin 10 mg daily which he has been tolerating well so he has been continued on those medications at this time.  He was also previously evaluated for chest discomfort and chest  pain which subsequently happened after an asthma attack and has resolved.  2.  Hypertension blood pressure 140/82 recheck of 137/62.  He has been advised to continue to take his blood pressure at home.  He is also been continued on his amlodipine 10 mg in the morning and 5 mg in the evening as well as his olmesartan 5 mg daily.  3.  Hyperlipidemia LDL of 71 on 05/15/2022.  He has been continued on rosuvastatin 10 mg daily.  He is also has an upcoming blood work with his PCP.  He requires no refills and there were no medication changes made today.  4.  Disposition patient return to clinic to see MD/APP in 6 months with an EKG on return or sooner if needed.  Trejon Duford, NP 06/23/2022, 3:42 PM

## 2022-06-23 ENCOUNTER — Ambulatory Visit: Payer: Medicare Other | Attending: Cardiology | Admitting: Cardiology

## 2022-06-23 ENCOUNTER — Encounter: Payer: Self-pay | Admitting: Cardiology

## 2022-06-23 VITALS — BP 137/62 | HR 51 | Ht 70.0 in | Wt 151.4 lb

## 2022-06-23 DIAGNOSIS — I2510 Atherosclerotic heart disease of native coronary artery without angina pectoris: Secondary | ICD-10-CM | POA: Diagnosis not present

## 2022-06-23 DIAGNOSIS — I2584 Coronary atherosclerosis due to calcified coronary lesion: Secondary | ICD-10-CM | POA: Diagnosis not present

## 2022-06-23 DIAGNOSIS — I10 Essential (primary) hypertension: Secondary | ICD-10-CM | POA: Diagnosis not present

## 2022-06-23 DIAGNOSIS — E7800 Pure hypercholesterolemia, unspecified: Secondary | ICD-10-CM | POA: Diagnosis not present

## 2022-06-23 NOTE — Patient Instructions (Signed)
Medication Instructions:  Your physician recommends that you continue on your current medications as directed. Please refer to the Current Medication list given to you today.  *If you need a refill on your cardiac medications before your next appointment, please call your pharmacy*   Follow-Up: At [facility], you and your health needs are our priority.  As part of our continuing mission to provide you with exceptional heart care, we have created designated Provider Care Teams.  These Care Teams include your primary Cardiologist (physician) and Advanced Practice Providers (APPs -  Physician Assistants and Nurse Practitioners) who all work together to provide you with the care you need, when you need it.  We recommend signing up for the patient portal called "MyChart".  Sign up information is provided on this After Visit Summary.  MyChart is used to connect with patients for Virtual Visits (Telemedicine).  Patients are able to view lab/test results, encounter notes, upcoming appointments, etc.  Non-urgent messages can be sent to your provider as well.   To learn more about what you can do with MyChart, go to [email or web address].    Your next appointment:   6 month(s)  The format for your next appointment:   In Person  Provider:   You may see Kate Sable, MD or one of the following Advanced Practice Providers on your designated Care Team:   Murray Hodgkins, NP Christell Faith, PA-C Cadence Kathlen Mody, PA-C Gerrie Nordmann, NP    Other Instructions   Important Information About Sugar

## 2022-06-29 ENCOUNTER — Encounter: Payer: Self-pay | Admitting: Emergency Medicine

## 2022-06-29 ENCOUNTER — Ambulatory Visit
Admission: EM | Admit: 2022-06-29 | Discharge: 2022-06-29 | Disposition: A | Discharge status: Home / Self Care | Payer: Medicare Other

## 2022-06-29 DIAGNOSIS — W57XXXA Bitten or stung by nonvenomous insect and other nonvenomous arthropods, initial encounter: Secondary | ICD-10-CM

## 2022-06-29 MED ORDER — METHYLPREDNISOLONE 4 MG PO TBPK: ORAL_TABLET | ORAL | 0 refills | Status: DC

## 2022-06-29 NOTE — ED Provider Notes (Signed)
Roderic Palau    CSN: [id number] Arrival date & time: 06/29/22  8921      History   Chief Complaint No chief complaint on file.   HPI POWELL HALBERT is a 81 y.o. male.   HPI  Patient presents to UC with complaint of swelling on his right hand.  He endorses insect bite (bee sting) occurring 1 week ago.  Stung on right hand and left ear and had swelling next day which had been improving.  He got a COVID vaccine yesterday and 2 hours following swelling on his right hand returned, continues to get worse, and is very itchy.  Patient has been using Benadryl cream as well as topical cortisone without relief of symptoms.  Past Medical History:  Diagnosis Date   Asthma    Dyspnea    Elevated blood pressure    Enlarged prostate    GERD (gastroesophageal reflux disease)    Glaucoma    History of adenomatous polyp of colon    Hx of colonic polyps    Hypertension    Leukopenia    has had an extensive w/up.     Liver hemangioma 01/24/2018   Thyroid goiter     Patient Active Problem List   Diagnosis Date Noted   Diarrhea 10/30/2021   Iron deficiency 05/05/2020   Hyperplasia of prostate with lower urinary tract symptoms (LUTS) 07/10/2018   Exotropia 07/10/2018   Blepharoconjunctivitis 07/10/2018   Primary open angle glaucoma (POAG) 06/28/2018   B12 deficiency 05/14/2018   Liver hemangioma 01/24/2018   Thrombocytopenia ([place]) 12/19/2017   Change in stool 10/12/2017   Asthma 08/30/2017   Urinary urgency 05/23/2017   Thyroid nodule 06/13/2016   Primary open angle glaucoma (POAG) of right eye, moderate stage 04/10/2016   Shortness of breath 08/30/2015   Bruit 06/14/2015   Health care maintenance 12/13/2014   Glaucoma 09/21/2014   Goiter 06/28/2014   Hypertension 06/28/2014   Leukopenia 06/28/2014   History of colonic polyps 06/28/2014   Enlarged prostate 06/28/2014   GERD (gastroesophageal reflux disease) 06/28/2014   History of adenomatous polyp of colon  06/26/2014   Gastrointestinal ulcer due to Helicobacter pylori 19/41/7408   Male erectile dysfunction, unspecified 07/24/2012   Incomplete emptying of bladder 07/24/2012   Elevated prostate specific antigen (PSA) 07/24/2012   Benign localized hyperplasia of prostate with urinary obstruction 07/24/2012    Past Surgical History:  Procedure Laterality Date   BACK SURGERY  2002   ruptured disc   CATARACT EXTRACTION W/PHACO Right 06/30/2020   Procedure: CATARACT EXTRACTION PHACO AND INTRAOCULAR LENS PLACEMENT (IOC) RIGHT, Malyugin 7.19 01:30.8 7.9%;  Surgeon: Leandrew Koyanagi, MD;  Location: [facility];  Service: Ophthalmology;  Laterality: Right;   COLONOSCOPY W/ POLYPECTOMY  04/24/2005, 07/06/2010, 07/08/2014   COLONOSCOPY WITH PROPOFOL N/A 02/01/2018   Procedure: COLONOSCOPY WITH PROPOFOL;  Surgeon: Manya Silvas, MD;  [facility] ENDOSCOPY;  Service: Endoscopy;  Laterality: N/A;   ESOPHAGOGASTRODUODENOSCOPY  04/24/2005, 07/06/2010,   ESOPHAGOGASTRODUODENOSCOPY (EGD) WITH PROPOFOL N/A 05/10/2022   Procedure: ESOPHAGOGASTRODUODENOSCOPY (EGD) WITH PROPOFOL;  Surgeon: Toledo, Benay Pike, MD;  Location: [facility] ENDOSCOPY;  Service: Gastroenterology;  Laterality: N/A;   EYE SURGERY  2009   relieve pressure from [place]   goiter       Home Medications    Prior to Admission medications   Medication Sig Start Date End Date Taking? Authorizing Provider  amLODipine (NORVASC) 5 MG tablet TAKE 1 TABLET (5 MG TOTAL) BY  MOUTH DAILY. 05/21/22  Yes Dutch Quint B, FNP  ASPIRIN 81 PO Take by mouth.   Yes [provider]  BREO ELLIPTA 100-25 MCG/INH AEPB TAKE 1 PUFF BY MOUTH EVERY DAY 06/06/21  Yes Einar Pheasant, MD  ferrous sulfate 325 (65 FE) MG EC tablet Take 325 mg by mouth daily.   Yes [provider]  latanoprost (XALATAN) 0.005 % ophthalmic solution ADMINISTER 1 DROP TO THE RIGHT EYE NIGHTLY. 03/07/19  Yes [provider]  olmesartan  (BENICAR) 5 MG tablet Take 5 mg by mouth daily. 05/14/22  Yes [provider]  omeprazole (PRILOSEC) 40 MG capsule TAKE 1 CAPSULE (40 MG TOTAL) BY MOUTH DAILY. 05/13/22  Yes Dutch Quint B, FNP  rosuvastatin (CRESTOR) 10 MG tablet Take 1 tablet (10 mg total) by mouth daily. 05/19/22 08/17/22 Yes Agbor-Etang, Aaron Edelman, MD  timolol (TIMOPTIC) 0.5 % ophthalmic solution Place 1 drop into the right eye 2 (two) times daily. 03/09/22  Yes [provider]  albuterol (VENTOLIN HFA) 108 (90 Base) MCG/ACT inhaler INHALE 2 PUFFS BY MOUTH EVERY 6 HOURS AS NEEDED 09/09/21   Einar Pheasant, MD  amLODipine (NORVASC) 10 MG tablet TAKE 1 TABLET BY MOUTH EVERY DAY 04/23/22   Dutch Quint B, FNP  metoprolol tartrate (LOPRESSOR) 50 MG tablet Take 1 tablet (50 mg) 2 hours prior to Cardiac CTA 05/15/22   Kate Sable, MD  sucralfate (CARAFATE) 1 g tablet TAKE 1 TABLET (1 G TOTAL) BY MOUTH 4 TIMES A DAY WITH MEALS AND AT BEDTIME 06/14/22   Einar Pheasant, MD  tamsulosin (FLOMAX) 0.4 MG CAPS capsule Take 1 capsule (0.4 mg total) by mouth daily. 07/15/21   Stoioff, Ronda Fairly, MD  vitamin B-12 (CYANOCOBALAMIN) 1000 MCG tablet Take by mouth.    [provider]    Family History Family History  Problem Relation Age of Onset   Heart disease Mother    Alcohol abuse Father    Hyperlipidemia Father    Stroke Father     Social History Social History   Tobacco Use   Smoking status: Never   Smokeless tobacco: Never  Vaping Use   Vaping Use: Never used  Substance Use Topics   Alcohol use: No    Alcohol/week: 0.0 standard drinks of alcohol   Drug use: No     Allergies   Brimonidine, Brimonidine tartrate, Brinzolamide-brimonidine, and Other   Review of Systems Review of Systems   Physical Exam Triage Vital Signs ED Triage Vitals  Enc Vitals Group     BP 06/29/22 0836 (!) 178/88     Pulse Rate 06/29/22 0836 (!) 59     Resp 06/29/22 0836 12     Temp 06/29/22 0836 97.6 F (36.4 C)      Temp Source 06/29/22 0836 Oral     SpO2 06/29/22 0836 95 %     Weight --      Height --      Head Circumference --      Peak Flow --      Pain Score 06/29/22 0839 0     Pain Loc --      Pain Edu? --      Excl. in [place]? --    No data found.  Updated Vital Signs BP (!) 178/88 (BP Location: Left Arm)   Pulse (!) 59   Temp 97.6 F (36.4 C) (Oral)   Resp 12   SpO2 95%   Visual Acuity Right Eye Distance:   Left Eye Distance:  Bilateral Distance:    Right Eye Near:   Left Eye Near:    Bilateral Near:     Physical Exam Vitals reviewed.  Constitutional:      Appearance: Normal appearance.  Musculoskeletal:     Right hand: Swelling present.       Arms:  Skin:    General: Skin is warm and dry.  Neurological:     General: No focal deficit present.     Mental Status: He is alert and oriented to person, place, and time.  Psychiatric:        Mood and Affect: Mood normal.        Behavior: Behavior normal.      UC Treatments / Results  Labs (all labs ordered are listed, but only abnormal results are displayed) Labs Reviewed - No data to display  EKG   Radiology No results found.  Procedures Procedures (including critical care time)  Medications Ordered in UC Medications - No data to display  Initial Impression / Assessment and Plan / UC Course  I have reviewed the triage vital signs and the nursing notes.  Pertinent labs & imaging results that were available during my care of the patient were reviewed by me and considered in my medical decision making (see chart for details).   Localized and recurrent swelling to his right dorsal hand now x1 week.  Recommended use of OTC antihistamine such as Zyrtec or Claritin versus oral steroid.  Patient states his preference for oral steroid given duration of symptoms.  Patient states he has old steroid prescription at home given for asthma exacerbation which he did not take.  He requests to obtain the medication and bring  to compare against what I would order.  Patient returned with packaged prednisone taper x6 days 60 mg->10 mg.   Final Clinical Impressions(s) / UC Diagnoses   Final diagnoses:  None   Discharge Instructions   None    ED Prescriptions   None    PDMP not reviewed this encounter.   Rose Phi, [place] 06/29/22 1302

## 2022-06-29 NOTE — ED Triage Notes (Signed)
Patient c/o insect bite (bee sting) that occurred 1 week ago.   Patient denies pain.   Patient endorses sting occurred on RT hand and LFT ear.   Patient endorses symptoms had been improving and " last night after getting the COVID shot yesterday, it started getting worse and itching again".    Patient endorses itching to site. Patient endorses swelling.   Patient has used benadryl cream and cortisone with no relief of symptoms.

## 2022-06-29 NOTE — Discharge Instructions (Signed)
Follow-up here or with your primary care provider if your symptoms do not respond to treatment.

## 2022-07-02 ENCOUNTER — Other Ambulatory Visit: Payer: Self-pay | Admitting: Urology

## 2022-07-02 DIAGNOSIS — N138 Other obstructive and reflux uropathy: Secondary | ICD-10-CM

## 2022-07-07 ENCOUNTER — Telehealth: Payer: Self-pay | Admitting: Internal Medicine

## 2022-07-07 DIAGNOSIS — R944 Abnormal results of kidney function studies: Secondary | ICD-10-CM

## 2022-07-07 NOTE — Telephone Encounter (Signed)
Patient has a lab appt 07/10/2022, there are no orders in.

## 2022-07-09 NOTE — Telephone Encounter (Signed)
Order placed for f/u lab.   

## 2022-07-09 NOTE — Addendum Note (Signed)
Addended by: Alisa Graff on: 07/09/2022 02:42 PM   Modules accepted: Orders

## 2022-07-10 ENCOUNTER — Other Ambulatory Visit (INDEPENDENT_AMBULATORY_CARE_PROVIDER_SITE_OTHER): Payer: Medicare Other

## 2022-07-10 DIAGNOSIS — R944 Abnormal results of kidney function studies: Secondary | ICD-10-CM | POA: Diagnosis not present

## 2022-07-10 LAB — BASIC METABOLIC PANEL
BUN: 14 mg/dL (ref 6–23)
CO2: 28 mEq/L (ref 19–32)
Calcium: 9.6 mg/dL (ref 8.4–10.5)
Chloride: 103 mEq/L (ref 96–112)
Creatinine, Ser: 1.33 mg/dL (ref 0.40–1.50)
GFR: 50.5 mL/min — ABNORMAL LOW (ref >60.00)
Glucose, Bld: 80 mg/dL (ref 70–99)
Potassium: 4.2 mEq/L (ref 3.5–5.1)
Sodium: 138 mEq/L (ref 135–145)

## 2022-07-15 ENCOUNTER — Other Ambulatory Visit: Payer: Self-pay | Admitting: Internal Medicine

## 2022-07-17 ENCOUNTER — Ambulatory Visit: Payer: Medicare Other | Admitting: Urology

## 2022-07-19 ENCOUNTER — Ambulatory Visit: Payer: Medicare Other | Admitting: Urology

## 2022-08-02 ENCOUNTER — Encounter: Payer: Self-pay | Admitting: Urology

## 2022-08-02 ENCOUNTER — Ambulatory Visit (INDEPENDENT_AMBULATORY_CARE_PROVIDER_SITE_OTHER): Payer: Medicare Other | Admitting: Urology

## 2022-08-02 VITALS — BP 147/87 | HR 67 | Ht 70.0 in | Wt 150.0 lb

## 2022-08-02 DIAGNOSIS — N138 Other obstructive and reflux uropathy: Secondary | ICD-10-CM

## 2022-08-02 DIAGNOSIS — N401 Enlarged prostate with lower urinary tract symptoms: Secondary | ICD-10-CM | POA: Diagnosis not present

## 2022-08-02 LAB — BLADDER SCAN AMB NON-IMAGING: Scan Result: 207

## 2022-08-02 MED ORDER — FINASTERIDE 5 MG PO TABS: 5.0000 mg | ORAL_TABLET | Freq: Every day | ORAL | 3 refills | Status: DC

## 2022-08-02 NOTE — Progress Notes (Signed)
08/02/2022 2:04 PM   Jose Perry [birth year] [id number]  Referring provider: Einar Pheasant, [address] [place],  [place] [address]  Chief Complaint  Patient presents with   Benign Prostatic Hypertrophy    Urologic history:   1.  BPH with lower urinary tract symptoms -Tamsulosin daily           HPI: 81 y.o. male presents for annual follow-up.  Doing well since last visit No bothersome LUTS Remains on tamsulosin Denies dysuria, gross hematuria Denies flank, abdominal or pelvic pain  PMH: Past Medical History:  Diagnosis Date   Asthma    Dyspnea    Elevated blood pressure    Enlarged prostate    GERD (gastroesophageal reflux disease)    Glaucoma    History of adenomatous polyp of colon    Hx of colonic polyps    Hypertension    Leukopenia    has had an extensive w/up.     Liver hemangioma 01/24/2018   Thyroid goiter     Surgical History: Past Surgical History:  Procedure Laterality Date   BACK SURGERY  2002   ruptured disc   CATARACT EXTRACTION W/PHACO Right 06/30/2020   Procedure: CATARACT EXTRACTION PHACO AND INTRAOCULAR LENS PLACEMENT (IOC) RIGHT, Malyugin 7.19 01:30.8 7.9%;  Surgeon: Leandrew Koyanagi, MD;  Location: [facility];  Service: Ophthalmology;  Laterality: Right;   COLONOSCOPY W/ POLYPECTOMY  04/24/2005, 07/06/2010, 07/08/2014   COLONOSCOPY WITH PROPOFOL N/A 02/01/2018   Procedure: COLONOSCOPY WITH PROPOFOL;  Surgeon: Manya Silvas, MD;  Location[facility] ENDOSCOPY;  Service: Endoscopy;  Laterality: N/A;   ESOPHAGOGASTRODUODENOSCOPY  04/24/2005, 07/06/2010,   ESOPHAGOGASTRODUODENOSCOPY (EGD) WITH PROPOFOL N/A 05/10/2022   Procedure: ESOPHAGOGASTRODUODENOSCOPY (EGD) WITH PROPOFOL;  Surgeon: Toledo, Benay Pike, MD;  Location: [facility] ENDOSCOPY;  Service: Gastroenterology;  Laterality: N/A;   EYE SURGERY  2009   relieve pressure from glaucoma   THYROID SURGERY  1968   goiter    Home Medications:  Allergies as  of 08/02/2022       Reactions   Brimonidine    Other reaction(s): Eye Redness   Brimonidine Tartrate    Other reaction(s): Eye redness   Brinzolamide-brimonidine    Other reaction(s): Eye redness   Other Other (See Comments)   Simbrinza Eye Drops gives pt Eye redness        Medication List        Accurate as of August 02, 2022  2:04 PM. If you have any questions, ask your nurse or doctor.          albuterol 108 (90 Base) MCG/ACT inhaler Commonly known as: VENTOLIN HFA INHALE 2 PUFFS BY MOUTH EVERY 6 HOURS AS NEEDED   amLODipine 5 MG tablet Commonly known as: NORVASC TAKE 1 TABLET (5 MG TOTAL) BY MOUTH DAILY. What changed: Another medication with the same name was removed. Continue taking this medication, and follow the directions you see here.   ASPIRIN 81 PO Take by mouth.   Breo Ellipta 100-25 MCG/ACT Aepb Generic drug: fluticasone furoate-vilanterol TAKE 1 PUFF BY MOUTH EVERY DAY   cyanocobalamin 1000 MCG tablet Commonly known as: VITAMIN B12 Take by mouth.   ferrous sulfate 325 (65 FE) MG EC tablet Take 325 mg by mouth daily.   latanoprost 0.005 % ophthalmic solution Commonly known as: XALATAN ADMINISTER 1 DROP TO THE RIGHT EYE NIGHTLY.   metoprolol tartrate 50 MG tablet Commonly known as: LOPRESSOR Take 1 tablet (50 mg) 2 hours prior to Cardiac CTA  olmesartan 5 MG tablet Commonly known as: BENICAR Take 5 mg by mouth daily.   omeprazole 40 MG capsule Commonly known as: PRILOSEC TAKE 1 CAPSULE (40 MG TOTAL) BY MOUTH DAILY.   rosuvastatin 10 MG tablet Commonly known as: Crestor Take 1 tablet (10 mg total) by mouth daily.   sucralfate 1 g tablet Commonly known as: CARAFATE TAKE 1 TABLET (1 G TOTAL) BY MOUTH 4 TIMES A DAY WITH MEALS AND AT BEDTIME   tamsulosin 0.4 MG Caps capsule Commonly known as: FLOMAX TAKE 1 CAPSULE BY MOUTH EVERY DAY   timolol 0.5 % ophthalmic solution Commonly known as: TIMOPTIC Place 1 drop into the right eye 2  (two) times daily.        Allergies:  Allergies  Allergen Reactions   Brimonidine     Other reaction(s): Eye Redness   Brimonidine Tartrate     Other reaction(s): Eye redness   Brinzolamide-Brimonidine     Other reaction(s): Eye redness   Other Other (See Comments)    Simbrinza Eye Drops gives pt Eye redness    Family History: Family History  Problem Relation Age of Onset   Heart disease Mother    Alcohol abuse Father    Hyperlipidemia Father    Stroke Father     Social History:  reports that he has never smoked. He has never used smokeless tobacco. He reports that he does not drink alcohol and does not use drugs.   Physical Exam: BP (!) 147/87   Pulse 67   Ht '5\' 10"'$  (1.778 m)   Wt 150 lb (68 kg)   BMI 21.52 kg/m   Constitutional:  Alert and oriented, No acute distress. HEENT: [place] AT Respiratory: Normal respiratory effort, no increased work of breathing. Psychiatric: Normal mood and affect.   Assessment & Plan:    1. BPH with lower urinary tract symptoms Stable LUTS on tamsulosin Bladder scan PVR 200 Continue tamsulosin We discussed adding a 5-ARI which may help him empty better and prevent progression.  He would like to start.  Side effects were discussed Continue annual follow-up with PVR   [name], MD  [facility] [address] [place], [place] [address] [phone]

## 2022-08-03 ENCOUNTER — Ambulatory Visit (INDEPENDENT_AMBULATORY_CARE_PROVIDER_SITE_OTHER): Payer: Medicare Other | Admitting: Pulmonary Disease

## 2022-08-03 ENCOUNTER — Encounter: Payer: Self-pay | Admitting: Pulmonary Disease

## 2022-08-03 VITALS — BP 124/78 | HR 61 | Temp 97.5°F | Ht 70.0 in | Wt 150.6 lb

## 2022-08-03 DIAGNOSIS — Z91148 Patient's other noncompliance with medication regimen for other reason: Secondary | ICD-10-CM | POA: Diagnosis not present

## 2022-08-03 DIAGNOSIS — R0602 Shortness of breath: Secondary | ICD-10-CM | POA: Diagnosis not present

## 2022-08-03 DIAGNOSIS — J4550 Severe persistent asthma, uncomplicated: Secondary | ICD-10-CM

## 2022-08-03 LAB — NITRIC OXIDE: Nitric Oxide: 58

## 2022-08-03 MED ORDER — TRELEGY ELLIPTA 200-62.5-25 MCG/ACT IN AEPB: 1.0000 | INHALATION_SPRAY | Freq: Every day | RESPIRATORY_TRACT | 0 refills | Status: DC

## 2022-08-03 NOTE — Patient Instructions (Signed)
We are going to have some blood work drawn.  You can have this drawn at Dr. Bary Leriche office.  We are going to schedule you for breathing tests.  I am giving you a trial of an inhaler called Trelegy Ellipta this is 1 puff daily.  Make sure you rinse your mouth well after you use it.  It is basically a little bit stronger version of your Breo.  Do not use the Breo while taking the Trelegy.  We will see you in follow-up in 4 to 6 weeks time, call sooner should any new problems arise.

## 2022-08-03 NOTE — Progress Notes (Signed)
Subjective:    Patient ID: Jose Perry, male    DOB: [birth year], 81 y.o.   MRN: [id number] Patient Care Team: Einar Pheasant, MD as PCP - General (Internal Medicine)  Chief Complaint  Patient presents with   Shortness of Breath    SOB for 18 months. SOB with exertion. Very little wheezing. Dry cough.    HPI Patient is an 81 year old lifelong never smoker with medical history as noted below, who presents for evaluation of shortness of breath worse over the last 18 months.  He is kindly referred by Dr. Einar Pheasant.  We last saw the patient on 10 September 2018 and he had been lost to follow-up since then.  During his last visit here he was instructed on the proper use of his asthma controller namely Breo Ellipta.  At that time he was using the [facility] only as needed and albuterol only as needed.  He was reminded that he needed to use his controller medication daily.  Since that time as noted he got lost to follow-up.  He now returns because of shortness of breath on exertion and a dry cough.  He notes that his cough and shortness of breath are worse during the fall months due to ragweed.  He notes very little wheezing.  He has had no chest pain, no orthopnea or paroxysmal nocturnal dyspnea.  No lower extremity edema.  No calf tenderness.  Patient wants to reestablish since he has been lost to follow-up.  He has actually not been using Breo regularly, using it as needed.  Current Breo strength is 100.   We have reviewed his interim history since his last visit.  He has had a coronary CT angio that was CAD RADS 2 with mild nonobstructive CAD (25-49%) in the proximal LAD, D1 and RCA, medical therapy was recommended at that time.  The included lung windows on the scan were normal.     DATA 07/11/2018 PFTs: FEV1 0.77 L or 31% predicted, FVC 1.52 L or 47% predicted, FEV1/FVC 51%, there was a 40% net change on FEV1 postbronchodilator.  There was air trapping but no hyperinflation noted on lung volumes.   Consistent with severe obstructive airways disease with significant bronchodilator response 05/18/2022 coronary CTA: Lung windows nonrevealing. 05/25/2022 chest x-ray PA and lateral: Hyperinflation otherwise no abnormalities.   Review of Systems A 10 point review of systems was performed and it is as noted above otherwise negative.  Past Medical History:  Diagnosis Date   Asthma    Dyspnea    Elevated blood pressure    Enlarged prostate    GERD (gastroesophageal reflux disease)    Glaucoma    History of adenomatous polyp of colon    Hx of colonic polyps    Hypertension    Leukopenia    has had an extensive w/up.     Liver hemangioma 01/24/2018   Thyroid goiter    Past Surgical History:  Procedure Laterality Date   BACK SURGERY  2002   ruptured disc   CATARACT EXTRACTION W/PHACO Right 06/30/2020   Procedure: CATARACT EXTRACTION PHACO AND INTRAOCULAR LENS PLACEMENT (IOC) RIGHT, Malyugin 7.19 01:30.8 7.9%;  Surgeon: Leandrew Koyanagi, MD;  Location: [place];  Service: Ophthalmology;  Laterality: Right;   COLONOSCOPY W/ POLYPECTOMY  04/24/2005, 07/06/2010, 07/08/2014   COLONOSCOPY WITH PROPOFOL N/A 02/01/2018   Procedure: COLONOSCOPY WITH PROPOFOL;  Surgeon: Manya Silvas, MD;  Location: [facility] ENDOSCOPY;  Service: Endoscopy;  Laterality: N/A;   ESOPHAGOGASTRODUODENOSCOPY  04/24/2005,  07/06/2010,   ESOPHAGOGASTRODUODENOSCOPY (EGD) WITH PROPOFOL N/A 05/10/2022   Procedure: ESOPHAGOGASTRODUODENOSCOPY (EGD) WITH PROPOFOL;  Surgeon: Toledo, Benay Pike, MD;  Location: [facility] ENDOSCOPY;  Service: Gastroenterology;  Laterality: N/A;   EYE SURGERY  2009   relieve pressure from [place]   goiter   Patient Active Problem List   Diagnosis Date Noted   Diarrhea 10/30/2021   Iron deficiency 05/05/2020   Hyperplasia of prostate with lower urinary tract symptoms (LUTS) 07/10/2018   Exotropia 07/10/2018   Blepharoconjunctivitis 07/10/2018   Primary open angle  glaucoma (POAG) 06/28/2018   B12 deficiency 05/14/2018   Liver hemangioma 01/24/2018   Thrombocytopenia ([place]) 12/19/2017   Change in stool 10/12/2017   Asthma 08/30/2017   Urinary urgency 05/23/2017   Thyroid nodule 06/13/2016   Primary open angle glaucoma (POAG) of right eye, moderate stage 04/10/2016   Shortness of breath 08/30/2015   Bruit 06/14/2015   Health care maintenance 12/13/2014   Glaucoma 09/21/2014   Goiter 06/28/2014   Hypertension 06/28/2014   Leukopenia 06/28/2014   History of colonic polyps 06/28/2014   Enlarged prostate 06/28/2014   GERD (gastroesophageal reflux disease) 06/28/2014   History of adenomatous polyp of colon 06/26/2014   Gastrointestinal ulcer due to Helicobacter pylori 39/12/90   Male erectile dysfunction, unspecified 07/24/2012   Incomplete emptying of bladder 07/24/2012   Elevated prostate specific antigen (PSA) 07/24/2012   Benign localized hyperplasia of prostate with urinary obstruction 07/24/2012   Family History  Problem Relation Age of Onset   Heart disease Mother    Alcohol abuse Father    Hyperlipidemia Father    Stroke Father    Allergies  Allergen Reactions   Brimonidine     Other reaction(s): Eye Redness   Brimonidine Tartrate     Other reaction(s): Eye redness   Brinzolamide-Brimonidine     Other reaction(s): Eye redness   Other Other (See Comments)    Simbrinza Eye Drops gives pt Eye redness   Current Meds  Medication Sig   albuterol (VENTOLIN HFA) 108 (90 Base) MCG/ACT inhaler INHALE 2 PUFFS BY MOUTH EVERY 6 HOURS AS NEEDED   amLODipine (NORVASC) 5 MG tablet TAKE 1 TABLET (5 MG TOTAL) BY MOUTH DAILY.   ASPIRIN 81 PO Take by mouth.   BREO ELLIPTA 100-25 MCG/INH AEPB TAKE 1 PUFF BY MOUTH EVERY DAY   ferrous sulfate 325 (65 FE) MG EC tablet Take 325 mg by mouth daily.   finasteride (PROSCAR) 5 MG tablet Take 1 tablet (5 mg total) by mouth daily.   fluticasone (FLONASE) 50 MCG/ACT nasal spray Place 2 sprays into both  nostrils as needed.   latanoprost (XALATAN) 0.005 % ophthalmic solution ADMINISTER 1 DROP TO THE RIGHT EYE NIGHTLY.   metoprolol tartrate (LOPRESSOR) 50 MG tablet Take 1 tablet (50 mg) 2 hours prior to Cardiac CTA   olmesartan (BENICAR) 5 MG tablet Take 5 mg by mouth daily.   omeprazole (PRILOSEC) 40 MG capsule TAKE 1 CAPSULE (40 MG TOTAL) BY MOUTH DAILY.   rosuvastatin (CRESTOR) 10 MG tablet Take 1 tablet (10 mg total) by mouth daily.   sucralfate (CARAFATE) 1 g tablet TAKE 1 TABLET (1 G TOTAL) BY MOUTH 4 TIMES A DAY WITH MEALS AND AT BEDTIME   tamsulosin (FLOMAX) 0.4 MG CAPS capsule TAKE 1 CAPSULE BY MOUTH EVERY DAY   timolol (TIMOPTIC) 0.5 % ophthalmic solution Place 1 drop into the right eye 2 (two) times daily.   vitamin B-12 (CYANOCOBALAMIN) 1000 MCG tablet  Take by mouth.   Immunization History  Administered Date(s) Administered   Fluad Quad(high Dose 65+) 08/27/2020, 07/11/2021, 06/16/2022   Influenza, High Dose Seasonal PF 06/13/2016, 08/20/2017, 07/31/2018, 08/22/2019   Influenza,inj,Quad PF,6+ Mos 07/20/2015   PFIZER Comirnaty(Gray Top)Covid-19 Tri-Sucrose Vaccine 02/26/2021, 06/28/2022   PFIZER(Purple Top)SARS-COV-2 Vaccination 10/30/2019, 11/20/2019, 07/05/2020   Pfizer Covid-19 Vaccine Bivalent Booster 59yr & up 07/01/2021   Pneumococcal Conjugate-13 06/10/2015   Pneumococcal Polysaccharide-23 06/30/2013   Tdap 10/25/2021   Zoster Recombinat (Shingrix) 07/06/2018, 08/15/2021   Zoster, Live 06/30/2013       Objective:   Physical Exam BP 124/78 (BP Location: Left Arm, Cuff Size: Normal)   Pulse 61   Temp (!) 97.5 F (36.4 C)   Ht '5\' 10"'$  (1.778 m)   Wt 150 lb 9.6 oz (68.3 kg)   SpO2 97%   BMI 21.61 kg/m  GENERAL: Thin well-developed, fit appearing elderly male in no acute distress. HEAD: Normocephalic, atraumatic. EYES: Pupils equal, round, reactive to light.  No scleral icterus.  Mild exotropia OD. MOUTH: Teeth intact, oral mucosa moist.  No thrush. NECK: Supple.  No thyromegaly. Trachea midline. No JVD.  No adenopathy. PULMONARY: Good air entry bilaterally.  Faint end expiratory wheezes throughout. CARDIOVASCULAR: S1 and S2. Regular rate and rhythm.  No rubs, murmurs or gallops heard. ABDOMEN: Benign. MUSCULOSKELETAL: No joint deformity, no clubbing, no edema.  NEUROLOGIC: Strabismus OD (exotropia), otherwise no focal deficits. SKIN: Intact,warm,dry. PSYCH: Mood and behavior normal.  Lab Results  Component Value Date   NITRICOXIDE 58 08/03/2022   Chest x-ray from 25 May 2022 on independent review hyperinflation is noted:     Assessment & Plan:     ICD-10-CM   1. Severe persistent asthma, unspecified whether complicated  JV40.08   Will reassess issues with eosinophilia/IgE/allergen Trial of Trelegy Ellipta 200 samples provided for the patient Needs to use maintenance inhaler DAILY    2. SOB (shortness of breath)  R06.02 Nitric oxide    Pulmonary Function Test [facility] Only    Allergen Panel (27) + IGE    Alpha-1 antitrypsin phenotype    CBC with Differential/Platelet    CANCELED: Nitric oxide   Likely due to poorly compensated asthma Evidence of type II inflammation    3. Non compliance w medication regimen  Z91.148    Due to poor insight as to his medical condition Reiterated need for maintenance med patient's for asthma Explained pathophysiology of asthma in layman's terms     Orders Placed This Encounter  Procedures   Allergen Panel (27) + IGE    Standing Status:   Future    Number of Occurrences:   1    Standing Expiration Date:   02/02/2023   Alpha-1 antitrypsin phenotype    Standing Status:   Future    Number of Occurrences:   1    Standing Expiration Date:   08/04/2023   CBC with Differential/Platelet    Standing Status:   Future    Number of Occurrences:   1    Standing Expiration Date:   08/04/2023   Nitric oxide   Pulmonary Function Test [facility] Only    Standing Status:   Future    Number of Occurrences:   1     Standing Expiration Date:   08/04/2023    Order Specific Question:   Full PFT: includes the following: basic spirometry, spirometry pre & post bronchodilator, diffusion capacity (DLCO), lung volumes    Answer:   Full PFT  Order Specific Question:   This test can only be performed at    Answer:   [place] ordered this encounter  Medications   Fluticasone-Umeclidin-Vilant (TRELEGY ELLIPTA) 200-62.5-25 MCG/ACT AEPB    Sig: Inhale 1 puff into the lungs daily.    Dispense:  28 each    Refill:  0    Order Specific Question:   Lot Number?    Answer:   [id number]    Order Specific Question:   Expiration Date?    Answer:   12/08/2023    Order Specific Question:   Quantity    Answer:   2   Fluticasone-Umeclidin-Vilant (TRELEGY ELLIPTA) 200-62.5-25 MCG/ACT AEPB    Sig: Inhale 1 puff into the lungs daily.    Dispense:  28 each    Refill:  0   Patient will be given a trial of Trelegy Ellipta 200, 1 inhalation daily.  He has significant type II inflammation noted on nitric oxide testing today.  This consistent with poorly compensated asthma.  Will reassess for potential allergen triggers.  He has significant hyperinflation noted on chest x-ray.  Will obtain alpha-1 also to exclude potential alpha-1 deficiency issues.  We will see the patient in follow-up in 4 to 6 weeks time he is to contact us prior to that time should any new difficulties arise.  Renold Don, MD Advanced [facility]ch[facility] [place]    *This note was dictated using voice recognition software/Dragon.  Despite best efforts to proofread, errors can occur which can change the meaning. Any transcriptional errors that result from this process are unintentional and may not be fully corrected at the time of dictation.

## 2022-08-14 ENCOUNTER — Other Ambulatory Visit: Payer: Self-pay

## 2022-08-14 MED ORDER — ROSUVASTATIN CALCIUM 10 MG PO TABS: 10.0000 mg | ORAL_TABLET | Freq: Every day | ORAL | 0 refills | Status: DC

## 2022-08-17 ENCOUNTER — Ambulatory Visit: Payer: Medicare Other | Admitting: Internal Medicine

## 2022-08-18 ENCOUNTER — Other Ambulatory Visit: Payer: Self-pay | Admitting: Internal Medicine

## 2022-08-25 ENCOUNTER — Ambulatory Visit (INDEPENDENT_AMBULATORY_CARE_PROVIDER_SITE_OTHER): Payer: Medicare Other | Admitting: Internal Medicine

## 2022-08-25 ENCOUNTER — Encounter: Payer: Self-pay | Admitting: Internal Medicine

## 2022-08-25 VITALS — BP 136/80 | HR 62 | Temp 97.9°F | Resp 17 | Ht 70.0 in | Wt 151.8 lb

## 2022-08-25 DIAGNOSIS — Z86010 Personal history of colonic polyps: Secondary | ICD-10-CM | POA: Diagnosis not present

## 2022-08-25 DIAGNOSIS — K219 Gastro-esophageal reflux disease without esophagitis: Secondary | ICD-10-CM

## 2022-08-25 DIAGNOSIS — R972 Elevated prostate specific antigen [PSA]: Secondary | ICD-10-CM

## 2022-08-25 DIAGNOSIS — I10 Essential (primary) hypertension: Secondary | ICD-10-CM | POA: Diagnosis not present

## 2022-08-25 DIAGNOSIS — I2510 Atherosclerotic heart disease of native coronary artery without angina pectoris: Secondary | ICD-10-CM

## 2022-08-25 DIAGNOSIS — E611 Iron deficiency: Secondary | ICD-10-CM

## 2022-08-25 DIAGNOSIS — E049 Nontoxic goiter, unspecified: Secondary | ICD-10-CM

## 2022-08-25 DIAGNOSIS — J4520 Mild intermittent asthma, uncomplicated: Secondary | ICD-10-CM | POA: Diagnosis not present

## 2022-08-25 DIAGNOSIS — R0602 Shortness of breath: Secondary | ICD-10-CM

## 2022-08-25 DIAGNOSIS — I2584 Coronary atherosclerosis due to calcified coronary lesion: Secondary | ICD-10-CM | POA: Diagnosis not present

## 2022-08-25 MED ORDER — ROSUVASTATIN CALCIUM 10 MG PO TABS: 10.0000 mg | ORAL_TABLET | Freq: Every day | ORAL | 1 refills | Status: DC

## 2022-08-25 MED ORDER — OLMESARTAN MEDOXOMIL 20 MG PO TABS: 10.0000 mg | ORAL_TABLET | Freq: Every day | ORAL | 1 refills | Status: DC

## 2022-08-25 NOTE — Patient Instructions (Signed)
Take crestor (rosuvastatin) '10mg'$  per day.   Take benicar '10mg'$  per day (1/2 of '20mg'$  tablet).    Continue amlodipine '5mg'$  per day.

## 2022-08-25 NOTE — Progress Notes (Unsigned)
Patient ID: Jose Perry, male   DOB: 15-May-1941, 81 y.o.   MRN: [id number]   Subjective:    Patient ID: Jose Perry, male    DOB: 1941-08-26, 81 y.o.   MRN: [id number]   Patient here for  Chief Complaint  Patient presents with   Follow-up   .   HPI Here to follow up regarding his blood pressure and breathing issues.  Saw Dr Patsey Berthold 1026/23 - recommended continuing trelegy.  Requested labs - draw here to day.  States his breathing is better.  Occasionally will need to clear throat and occasional cough - overall improved.  Using nasal spray recommended by ENT.  Using 1/2 days.  No significant acid reflux.  No chest pain.  Stays active.  No vomiting.  No abdominal pain or bowel change.     Past Medical History:  Diagnosis Date   Asthma    Dyspnea    Elevated blood pressure    Enlarged prostate    GERD (gastroesophageal reflux disease)    Glaucoma    History of adenomatous polyp of colon    Hx of colonic polyps    Hypertension    Leukopenia    has had an extensive w/up.     Liver hemangioma 01/24/2018   Thyroid goiter    Past Surgical History:  Procedure Laterality Date   BACK SURGERY  2002   ruptured disc   CATARACT EXTRACTION W/PHACO Right 06/30/2020   Procedure: CATARACT EXTRACTION PHACO AND INTRAOCULAR LENS PLACEMENT (IOC) RIGHT, Malyugin 7.19 01:30.8 7.9%;  Surgeon: Leandrew Koyanagi, MD;  Location: [place];  Service: Ophthalmology;  Laterality: Right;   COLONOSCOPY W/ POLYPECTOMY  04/24/2005, 07/06/2010, 07/08/2014   COLONOSCOPY WITH PROPOFOL N/A 02/01/2018   Procedure: COLONOSCOPY WITH PROPOFOL;  Surgeon: Manya Silvas, MD;  Location: [facility] ENDOSCOPY;  Service: Endoscopy;  Laterality: N/A;   ESOPHAGOGASTRODUODENOSCOPY  04/24/2005, 07/06/2010,   ESOPHAGOGASTRODUODENOSCOPY (EGD) WITH PROPOFOL N/A 05/10/2022   Procedure: ESOPHAGOGASTRODUODENOSCOPY (EGD) WITH PROPOFOL;  Surgeon: Toledo, Benay Pike, MD;  Location: [facility] ENDOSCOPY;  Service: Gastroenterology;   Laterality: N/A;   EYE SURGERY  2009   relieve pressure from glaucoma   THYROID SURGERY  1968   goiter   Family History  Problem Relation Age of Onset   Heart disease Mother    Alcohol abuse Father    Hyperlipidemia Father    Stroke Father    Social History   Socioeconomic History   Marital status: Married    Spouse name: Not on file   Number of children: Not on file   Years of education: Not on file   Highest education level: Not on file  Occupational History   Not on file  Tobacco Use   Smoking status: Never   Smokeless tobacco: Never  Vaping Use   Vaping Use: Never used  Substance and Sexual Activity   Alcohol use: No    Alcohol/week: 0.0 standard drinks of alcohol   Drug use: No   Sexual activity: Yes  Other Topics Concern   Not on file  Social History Narrative   Not on file   Social Determinants of Health   Financial Resource Strain: Low Risk  (10/14/2021)   Overall Financial Resource Strain (CARDIA)    Difficulty of Paying Living Expenses: Not hard at all  Food Insecurity: No Food Insecurity (10/14/2021)   Hunger Vital Sign    Worried About Running Out of Food in the Last Year: Never true    Ran Out of Food  in the Last Year: Never true  Transportation Needs: No Transportation Needs (10/14/2021)   PRAPARE - Hydrologist (Medical): No    Lack of Transportation (Non-Medical): No  Physical Activity: Sufficiently Active (10/14/2021)   Exercise Vital Sign    Days of Exercise per Week: 7 days    Minutes of Exercise per Session: 60 min  Stress: No Stress Concern Present (10/14/2021)   [place]    Feeling of Stress : Not at all  Social Connections: Unknown (10/14/2021)   Social Connection and Isolation Panel [NHANES]    Frequency of Communication with Friends and Family: Not on file    Frequency of Social Gatherings with Friends and Family: Not on file    Attends Religious  Services: Not on file    Active Member of Clubs or Organizations: Not on file    Attends Archivist Meetings: Not on file    Marital Status: Married     Review of Systems  Constitutional:  Negative for appetite change and unexpected weight change.  HENT:  Negative for congestion and sinus pressure.   Respiratory:  Negative for chest tightness and shortness of breath.        No significant cough.  Breathing better.   Cardiovascular:  Negative for chest pain, palpitations and leg swelling.  Gastrointestinal:  Negative for abdominal pain, diarrhea, nausea and vomiting.  Genitourinary:  Negative for difficulty urinating and dysuria.  Musculoskeletal:  Negative for joint swelling and myalgias.  Skin:  Negative for color change and rash.  Neurological:  Negative for dizziness and headaches.  Psychiatric/Behavioral:  Negative for agitation and dysphoric mood.        Objective:     BP 136/80 (BP Location: Left Arm, Patient Position: Sitting, Cuff Size: Small)   Pulse 62   Temp 97.9 F (36.6 C) (Temporal)   Resp 17   Ht '5\' 10"'$  (1.778 m)   Wt 151 lb 12.8 oz (68.9 kg)   SpO2 98%   BMI 21.78 kg/m  Wt Readings from Last 3 Encounters:  08/25/22 151 lb 12.8 oz (68.9 kg)  08/03/22 150 lb 9.6 oz (68.3 kg)  08/02/22 150 lb (68 kg)    Physical Exam Vitals reviewed.  Constitutional:      General: He is not in acute distress.    Appearance: Normal appearance. He is well-developed.  HENT:     Head: Normocephalic and atraumatic.     Right Ear: External ear normal.     Left Ear: External ear normal.  Eyes:     General: No scleral icterus.       Right eye: No discharge.        Left eye: No discharge.     Conjunctiva/sclera: Conjunctivae normal.  Cardiovascular:     Rate and Rhythm: Normal rate and regular rhythm.  Pulmonary:     Effort: Pulmonary effort is normal. No respiratory distress.     Breath sounds: Normal breath sounds.  Abdominal:     General: Bowel sounds are  normal.     Palpations: Abdomen is soft.     Tenderness: There is no abdominal tenderness.  Musculoskeletal:        General: No swelling or tenderness.     Cervical back: Neck supple. No tenderness.  Lymphadenopathy:     Cervical: No cervical adenopathy.  Skin:    Findings: No erythema or rash.  Neurological:     Mental Status:  He is alert.  Psychiatric:        Mood and Affect: Mood normal.        Behavior: Behavior normal.      Outpatient Encounter Medications as of 08/25/2022  Medication Sig   albuterol (VENTOLIN HFA) 108 (90 Base) MCG/ACT inhaler INHALE 2 PUFFS BY MOUTH EVERY 6 HOURS AS NEEDED   amLODipine (NORVASC) 5 MG tablet TAKE 1 TABLET (5 MG TOTAL) BY MOUTH DAILY.   ASPIRIN 81 PO Take by mouth.   ferrous sulfate 325 (65 FE) MG EC tablet Take 325 mg by mouth daily.   finasteride (PROSCAR) 5 MG tablet Take 1 tablet (5 mg total) by mouth daily.   fluticasone (FLONASE) 50 MCG/ACT nasal spray Place 2 sprays into both nostrils as needed.   Fluticasone-Umeclidin-Vilant (TRELEGY ELLIPTA) 200-62.5-25 MCG/ACT AEPB Inhale 1 puff into the lungs daily.   Fluticasone-Umeclidin-Vilant (TRELEGY ELLIPTA) 200-62.5-25 MCG/ACT AEPB Inhale 1 puff into the lungs daily.   latanoprost (XALATAN) 0.005 % ophthalmic solution ADMINISTER 1 DROP TO THE RIGHT EYE NIGHTLY.   metoprolol tartrate (LOPRESSOR) 50 MG tablet Take 1 tablet (50 mg) 2 hours prior to Cardiac CTA   olmesartan (BENICAR) 20 MG tablet Take 0.5 tablets (10 mg total) by mouth daily.   omeprazole (PRILOSEC) 40 MG capsule TAKE 1 CAPSULE (40 MG TOTAL) BY MOUTH DAILY.   sucralfate (CARAFATE) 1 g tablet TAKE 1 TABLET (1 G TOTAL) BY MOUTH 4 TIMES A DAY WITH MEALS AND AT BEDTIME   tamsulosin (FLOMAX) 0.4 MG CAPS capsule TAKE 1 CAPSULE BY MOUTH EVERY DAY   timolol (TIMOPTIC) 0.5 % ophthalmic solution Place 1 drop into the right eye 2 (two) times daily.   vitamin B-12 (CYANOCOBALAMIN) 1000 MCG tablet Take by mouth.   [DISCONTINUED] olmesartan  (BENICAR) 5 MG tablet Take 5 mg by mouth daily.   [DISCONTINUED] rosuvastatin (CRESTOR) 10 MG tablet Take 1 tablet (10 mg total) by mouth daily.   rosuvastatin (CRESTOR) 10 MG tablet Take 1 tablet (10 mg total) by mouth daily.   No facility-administered encounter medications on file as of 08/25/2022.     Lab Results  Component Value Date   WBC 3.1 (L) 08/25/2022   HGB 13.0 08/25/2022   HCT 39.8 08/25/2022   PLT 152 08/25/2022   GLUCOSE 80 07/10/2022   CHOL 151 05/15/2022   TRIG 36 05/15/2022   HDL 73 05/15/2022   LDLCALC 71 05/15/2022   ALT 17 06/16/2022   AST 23 06/16/2022   NA 138 07/10/2022   K 4.2 07/10/2022   CL 103 07/10/2022   CREATININE 1.33 07/10/2022   BUN 14 07/10/2022   CO2 28 07/10/2022   TSH 1.21 06/16/2022   PSA 2.86 07/11/2021       Assessment & Plan:   Problem List Items Addressed This Visit     Asthma - Primary    Using trelegy regularly.  Breathing overall doing well.  No increased cough.  No significant acid reflux.  Follow.        Elevated prostate specific antigen (PSA)    Has been followed by urology.        GERD (gastroesophageal reflux disease)    On prilosec. No significant acid reflux.  Follow.       Goiter    Followed by Dr Honor Junes.  Last checked 10/2021.  Recommended f/u ultrasound in 2 years.  Follow tsh.       History of colonic polyps    Colonoscopy 01/2018.  Follow.  Hypertension    On amlodipine and benicar.  Pressures remain a little elevated.  Increase benicar to '10mg'$  q day.  (1/2 of '20mg'$  ).  Follow pressures.  Follow metabolic panel.       Relevant Medications   rosuvastatin (CRESTOR) 10 MG tablet   olmesartan (BENICAR) 20 MG tablet   Other Relevant Orders   Basic metabolic panel   Hepatic function panel   Lipid panel   Iron deficiency    Saw GI.  Recommended holding on EGD and colonoscopy.  Follow cbc and iron studies.       Relevant Orders   CBC with Differential/Platelet   IBC + Ferritin   Other  Visit Diagnoses     SOB (shortness of breath)            Einar Pheasant, MD

## 2022-08-27 ENCOUNTER — Encounter: Payer: Self-pay | Admitting: Internal Medicine

## 2022-08-27 NOTE — Assessment & Plan Note (Signed)
Using trelegy regularly.  Breathing overall doing well.  No increased cough.  No significant acid reflux.  Follow.

## 2022-08-27 NOTE — Assessment & Plan Note (Signed)
On prilosec. No significant acid reflux.  Follow.

## 2022-08-27 NOTE — Assessment & Plan Note (Signed)
Saw GI.  Recommended holding on EGD and colonoscopy.  Follow cbc and iron studies.

## 2022-08-27 NOTE — Assessment & Plan Note (Addendum)
On amlodipine and benicar.  Pressures remain a little elevated.  Increase benicar to '10mg'$  q day.  (1/2 of '20mg'$  ).  Follow pressures.  Follow metabolic panel.

## 2022-08-27 NOTE — Assessment & Plan Note (Signed)
Followed by Dr Honor Junes.  Last checked 10/2021.  Recommended f/u ultrasound in 2 years.  Follow tsh.

## 2022-08-27 NOTE — Assessment & Plan Note (Signed)
Colonoscopy 01/2018.  Follow.

## 2022-08-27 NOTE — Assessment & Plan Note (Addendum)
Has been followed by urology.   

## 2022-08-28 DIAGNOSIS — H6123 Impacted cerumen, bilateral: Secondary | ICD-10-CM | POA: Diagnosis not present

## 2022-08-28 DIAGNOSIS — J301 Allergic rhinitis due to pollen: Secondary | ICD-10-CM | POA: Diagnosis not present

## 2022-08-28 LAB — ALLERGEN PANEL (27) + IGE
Alternaria Alternata IgE: 14.3 kU/L — AB
Aspergillus Fumigatus IgE: 2.52 kU/L — AB
Bahia Grass IgE: 0.48 kU/L — AB
Bermuda Grass IgE: 0.27 kU/L — AB
Cat Dander IgE: 0.12 kU/L — AB
Cedar, Mountain IgE: <0.1 kU/L
Cladosporium Herbarum IgE: 0.93 kU/L — AB
Cocklebur IgE: 0.22 kU/L — AB
Cockroach, American IgE: <0.1 kU/L
Common Silver Birch IgE: 1.84 kU/L — AB
D Farinae IgE: 0.46 kU/L — AB
D Pteronyssinus IgE: 0.55 kU/L — AB
Dog Dander IgE: <0.1 kU/L
Elm, American IgE: 0.41 kU/L — AB
Hickory, White IgE: 0.89 kU/L — AB
IgE (Immunoglobulin E), Serum: 325 IU/mL (ref 6–495)
Johnson Grass IgE: 0.33 kU/L — AB
Kentucky Bluegrass IgE: 1.91 kU/L — AB
Maple/Box Elder IgE: 0.11 kU/L — AB
Mucor Racemosus IgE: 0.13 kU/L — AB
Oak, White IgE: 0.99 kU/L — AB
Penicillium Chrysogen IgE: 0.36 kU/L — AB
Pigweed, Rough IgE: <0.1 kU/L
Plantain, English IgE: <0.1 kU/L
Ragweed, Short IgE: 0.38 kU/L — AB
Setomelanomma Rostrat: 2.61 kU/L — AB
Timothy Grass IgE: 0.84 kU/L — AB
White Mulberry IgE: <0.1 kU/L

## 2022-08-28 LAB — CBC WITH DIFFERENTIAL/PLATELET
Basophils Absolute: 0 10*3/uL (ref 0.0–0.2)
Basos: 1 %
EOS (ABSOLUTE): 0.3 10*3/uL (ref 0.0–0.4)
Eos: 9 %
Hematocrit: 39.8 % (ref 37.5–51.0)
Hemoglobin: 13 g/dL (ref 13.0–17.7)
Immature Grans (Abs): 0 10*3/uL (ref 0.0–0.1)
Immature Granulocytes: 0 %
Lymphocytes Absolute: 0.8 10*3/uL (ref 0.7–3.1)
Lymphs: 26 %
MCH: 27.7 pg (ref 26.6–33.0)
MCHC: 32.7 g/dL (ref 31.5–35.7)
MCV: 85 fL (ref 79–97)
Monocytes Absolute: 0.3 10*3/uL (ref 0.1–0.9)
Monocytes: 10 %
Neutrophils Absolute: 1.7 10*3/uL (ref 1.4–7.0)
Neutrophils: 54 %
Platelets: 152 10*3/uL (ref 150–450)
RBC: 4.7 x10E6/uL (ref 4.14–5.80)
RDW: 13.5 % (ref 11.6–15.4)
WBC: 3.1 10*3/uL — ABNORMAL LOW (ref 3.4–10.8)

## 2022-08-29 ENCOUNTER — Other Ambulatory Visit: Payer: Self-pay

## 2022-08-29 DIAGNOSIS — Z9109 Other allergy status, other than to drugs and biological substances: Secondary | ICD-10-CM

## 2022-09-06 LAB — ALPHA-1 ANTITRYPSIN PHENOTYPE: A-1 Antitrypsin, Ser: 124 mg/dL (ref 83–199)

## 2022-09-08 DIAGNOSIS — H401123 Primary open-angle glaucoma, left eye, severe stage: Secondary | ICD-10-CM | POA: Diagnosis not present

## 2022-09-08 DIAGNOSIS — H2512 Age-related nuclear cataract, left eye: Secondary | ICD-10-CM | POA: Diagnosis not present

## 2022-09-08 DIAGNOSIS — H0012 Chalazion right lower eyelid: Secondary | ICD-10-CM | POA: Diagnosis not present

## 2022-09-08 DIAGNOSIS — H401112 Primary open-angle glaucoma, right eye, moderate stage: Secondary | ICD-10-CM | POA: Diagnosis not present

## 2022-09-12 ENCOUNTER — Ambulatory Visit: Payer: Medicare Other | Attending: Pulmonary Disease

## 2022-09-12 DIAGNOSIS — R0602 Shortness of breath: Secondary | ICD-10-CM | POA: Insufficient documentation

## 2022-09-12 DIAGNOSIS — Z79899 Other long term (current) drug therapy: Secondary | ICD-10-CM | POA: Insufficient documentation

## 2022-09-12 DIAGNOSIS — J449 Chronic obstructive pulmonary disease, unspecified: Secondary | ICD-10-CM | POA: Insufficient documentation

## 2022-09-12 LAB — PULMONARY FUNCTION TEST ARMC ONLY
DL/VA % pred: 115 %
DL/VA: 4.49 ml/min/mmHg/L
DLCO unc % pred: 82 %
DLCO unc: 20.15 ml/min/mmHg
FEF 25-75 Post: 0.77 L/sec
FEF 25-75 Pre: 0.74 L/sec
FEF2575-%Change-Post: 3 %
FEF2575-%Pred-Post: 38 %
FEF2575-%Pred-Pre: 37 %
FEV1-%Change-Post: 0 %
FEV1-%Pred-Post: 44 %
FEV1-%Pred-Pre: 44 %
FEV1-Post: 1.28 L
FEV1-Pre: 1.28 L
FEV1FVC-%Change-Post: -1 %
FEV1FVC-%Pred-Pre: 92 %
FEV6-%Change-Post: 0 %
FEV6-%Pred-Post: 50 %
FEV6-%Pred-Pre: 51 %
FEV6-Post: 1.92 L
FEV6-Pre: 1.93 L
FEV6FVC-%Change-Post: -1 %
FEV6FVC-%Pred-Post: 105 %
FEV6FVC-%Pred-Pre: 107 %
FVC-%Change-Post: 1 %
FVC-%Pred-Post: 48 %
FVC-%Pred-Pre: 47 %
FVC-Post: 1.95 L
FVC-Pre: 1.93 L
Post FEV1/FVC ratio: 65 %
Post FEV6/FVC ratio: 99 %
Pre FEV1/FVC ratio: 66 %
Pre FEV6/FVC Ratio: 100 %
RV % pred: 119 %
RV: 3.17 L
TLC % pred: 74 %
TLC: 5.27 L

## 2022-09-12 MED ORDER — ALBUTEROL SULFATE (2.5 MG/3ML) 0.083% IN NEBU
2.5000 mg | INHALATION_SOLUTION | Freq: Once | RESPIRATORY_TRACT | Status: AC
  Administered 2022-09-12: 2.5 mg via RESPIRATORY_TRACT
  Filled 2022-09-12: qty 3

## 2022-09-13 ENCOUNTER — Encounter: Payer: Self-pay | Admitting: Pulmonary Disease

## 2022-09-13 ENCOUNTER — Ambulatory Visit (INDEPENDENT_AMBULATORY_CARE_PROVIDER_SITE_OTHER): Payer: Medicare Other | Admitting: Pulmonary Disease

## 2022-09-13 VITALS — BP 130/84 | HR 58 | Ht 70.0 in | Wt 152.4 lb

## 2022-09-13 DIAGNOSIS — J4550 Severe persistent asthma, uncomplicated: Secondary | ICD-10-CM

## 2022-09-13 DIAGNOSIS — R0602 Shortness of breath: Secondary | ICD-10-CM | POA: Diagnosis not present

## 2022-09-13 DIAGNOSIS — Z91148 Patient's other noncompliance with medication regimen for other reason: Secondary | ICD-10-CM | POA: Diagnosis not present

## 2022-09-13 DIAGNOSIS — Z9109 Other allergy status, other than to drugs and biological substances: Secondary | ICD-10-CM | POA: Diagnosis not present

## 2022-09-13 LAB — NITRIC OXIDE: Nitric Oxide: 27

## 2022-09-13 MED ORDER — FLUTICASONE FUROATE-VILANTEROL 200-25 MCG/ACT IN AEPB: 1.0000 | INHALATION_SPRAY | Freq: Every day | RESPIRATORY_TRACT | 11 refills | Status: DC

## 2022-09-13 NOTE — Progress Notes (Incomplete)
Subjective:    Patient ID: Jose Perry, male    DOB: 07/30/1941, 81 y.o.   MRN: [id number] Patient Care Team: Einar Pheasant, MD as PCP - General (Internal Medicine)  Chief Complaint  Patient presents with  . Shortness of Breath    SOB for 18 months. SOB with exertion. Very little wheezing. Dry cough.     HPI Patient is an 81 year old lifelong never smoker with medical history as noted below, who presents for evaluation of shortness of breath worse over the last 18 months.  He is kindly referred by Dr. Einar Pheasant.  We last saw the patient on 10 September 2018 and he had been lost to follow-up since then.  During his last visit here he was instructed on the proper use of his asthma controller namely Breo Ellipta.  At that time he was using the [facility] only as needed and albuterol only as needed.  He was reminded that he needed to use his controller medication daily.  Since that time as noted he got lost to follow-up.  He now returns because of shortness of breath on exertion and a dry cough.  He notes that his cough and shortness of breath are worse during the fall months due to ragweed.  He notes very little wheezing.  He has had no chest pain, no orthopnea or paroxysmal nocturnal dyspnea.  No lower extremity edema.  No calf tenderness.  Patient wants to reestablish since he has been lost to follow-up.  He has actually not been using Breo regularly, using it as needed.  Current Breo strength is 100.  We have reviewed his interim history since his last visit.  He has had a coronary CT angio that was CAD RADS 2 with mild nonobstructive CAD (25-49%) in the proximal LAD, D1 and RCA, medical therapy was recommended at that time.  The included lung windows on the scan were normal.   DATA 07/11/2018 PFTs: FEV1 0.77 L or 31% predicted, FVC 1.52 L or 47% predicted, FEV1/FVC 51%, there was a 40% net change on FEV1 postbronchodilator.  There was air trapping but no hyperinflation noted on lung volumes.   Consistent with severe obstructive airways disease with significant bronchodilator response 05/18/2022 coronary CTA: Lung windows nonrevealing. 05/25/2022 chest x-ray PA and lateral:  Review of Systems A 10 point review of systems was performed and it is as noted above otherwise negative.  Past Medical History:  Diagnosis Date  . Asthma   . Dyspnea   . Elevated blood pressure   . Enlarged prostate   . GERD (gastroesophageal reflux disease)   . Glaucoma   . History of adenomatous polyp of colon   . Hx of colonic polyps   . Hypertension   . Leukopenia    has had an extensive w/up.    . Liver hemangioma 01/24/2018  . Thyroid goiter    Past Surgical History:  Procedure Laterality Date  . BACK SURGERY  2002   ruptured disc  . CATARACT EXTRACTION W/PHACO Right 06/30/2020   Procedure: CATARACT EXTRACTION PHACO AND INTRAOCULAR LENS PLACEMENT (IOC) RIGHT, [place] 7.19 01:30.8 7.9%;  Surgeon: Leandrew Koyanagi, MD;  Location: [facility];  Service: Ophthalmology;  Laterality: Right;  . COLONOSCOPY W/ POLYPECTOMY  04/24/2005, 07/06/2010, 07/08/2014  . COLONOSCOPY WITH PROPOFOL N/A 02/01/2018   Procedure: COLONOSCOPY WITH PROPOFOL;  Surgeon: Manya Silvas, MD;  Location: [facility] ENDOSCOPY;  Service: Endoscopy;  Laterality: N/A;  . ESOPHAGOGASTRODUODENOSCOPY  04/24/2005, 07/06/2010,  . ESOPHAGOGASTRODUODENOSCOPY (EGD) WITH PROPOFOL  N/A 05/10/2022   Procedure: ESOPHAGOGASTRODUODENOSCOPY (EGD) WITH PROPOFOL;  Surgeon: Toledo, Benay Pike, MD;  Location: [facility] ENDOSCOPY;  Service: Gastroenterology;  Laterality: N/A;  . EYE SURGERY  2009   relieve pressure from glaucoma  . THYROID SURGERY  1968   goiter   Patient Active Problem List   Diagnosis Date Noted  . Diarrhea 10/30/2021  . Iron deficiency 05/05/2020  . Hyperplasia of prostate with lower urinary tract symptoms (LUTS) 07/10/2018  . Exotropia 07/10/2018  . Blepharoconjunctivitis 07/10/2018  . Primary open angle glaucoma (POAG)  06/28/2018  . B12 deficiency 05/14/2018  . Liver hemangioma 01/24/2018  . Thrombocytopenia ([place]) 12/19/2017  . Change in stool 10/12/2017  . Asthma 08/30/2017  . Urinary urgency 05/23/2017  . Thyroid nodule 06/13/2016  . Primary open angle glaucoma (POAG) of right eye, moderate stage 04/10/2016  . Shortness of breath 08/30/2015  . Bruit 06/14/2015  . Health care maintenance 12/13/2014  . Glaucoma 09/21/2014  . Goiter 06/28/2014  . Hypertension 06/28/2014  . Leukopenia 06/28/2014  . History of colonic polyps 06/28/2014  . Enlarged prostate 06/28/2014  . GERD (gastroesophageal reflux disease) 06/28/2014  . History of adenomatous polyp of colon 06/26/2014  . Gastrointestinal ulcer due to Helicobacter pylori 67/89/3810  . Male erectile dysfunction, unspecified 07/24/2012  . Incomplete emptying of bladder 07/24/2012  . Elevated prostate specific antigen (PSA) 07/24/2012  . Benign localized hyperplasia of prostate with urinary obstruction 07/24/2012   Family History  Problem Relation Age of Onset  . Heart disease Mother   . Alcohol abuse Father   . Hyperlipidemia Father   . Stroke Father    Allergies  Allergen Reactions  . Brimonidine     Other reaction(s): Eye Redness  . Brimonidine Tartrate     Other reaction(s): Eye redness  . Brinzolamide-Brimonidine     Other reaction(s): Eye redness  . Other Other (See Comments)    Simbrinza Eye Drops gives pt Eye redness   Current Meds  Medication Sig  . albuterol (VENTOLIN HFA) 108 (90 Base) MCG/ACT inhaler INHALE 2 PUFFS BY MOUTH EVERY 6 HOURS AS NEEDED  . amLODipine (NORVASC) 5 MG tablet TAKE 1 TABLET (5 MG TOTAL) BY MOUTH DAILY.  Marland Kitchen ASPIRIN 81 PO Take by mouth.  Marland Kitchen BREO ELLIPTA 100-25 MCG/INH AEPB TAKE 1 PUFF BY MOUTH EVERY DAY  . ferrous sulfate 325 (65 FE) MG EC tablet Take 325 mg by mouth daily.  . finasteride (PROSCAR) 5 MG tablet Take 1 tablet (5 mg total) by mouth daily.  . fluticasone (FLONASE) 50 MCG/ACT nasal spray  Place 2 sprays into both nostrils as needed.  . latanoprost (XALATAN) 0.005 % ophthalmic solution ADMINISTER 1 DROP TO THE RIGHT EYE NIGHTLY.  . metoprolol tartrate (LOPRESSOR) 50 MG tablet Take 1 tablet (50 mg) 2 hours prior to Cardiac CTA  . olmesartan (BENICAR) 5 MG tablet Take 5 mg by mouth daily.  Marland Kitchen omeprazole (PRILOSEC) 40 MG capsule TAKE 1 CAPSULE (40 MG TOTAL) BY MOUTH DAILY.  . rosuvastatin (CRESTOR) 10 MG tablet Take 1 tablet (10 mg total) by mouth daily.  . sucralfate (CARAFATE) 1 g tablet TAKE 1 TABLET (1 G TOTAL) BY MOUTH 4 TIMES A DAY WITH MEALS AND AT BEDTIME  . tamsulosin (FLOMAX) 0.4 MG CAPS capsule TAKE 1 CAPSULE BY MOUTH EVERY DAY  . timolol (TIMOPTIC) 0.5 % ophthalmic solution Place 1 drop into the right eye 2 (two) times daily.  . vitamin B-12 (CYANOCOBALAMIN) 1000 MCG tablet Take by mouth.   Immunization History  Administered Date(s) Administered  . Fluad Quad(high Dose 65+) 08/27/2020, 07/11/2021, 06/16/2022  . Influenza, High Dose Seasonal PF 06/13/2016, 08/20/2017, 07/31/2018, 08/22/2019  . Influenza,inj,Quad PF,6+ Mos 07/20/2015  . PFIZER Comirnaty(Gray Top)Covid-19 Tri-Sucrose Vaccine 02/26/2021, 06/28/2022  . PFIZER(Purple Top)SARS-COV-2 Vaccination 10/30/2019, 11/20/2019, 07/05/2020  . Pension scheme manager 68yr & up 07/01/2021  . Pneumococcal Conjugate-13 06/10/2015  . Pneumococcal Polysaccharide-23 06/30/2013  . Tdap 10/25/2021  . Zoster Recombinat (Shingrix) 07/06/2018, 08/15/2021  . Zoster, Live 06/30/2013       Objective:   Physical Exam BP 124/78 (BP Location: Left Arm, Cuff Size: Normal)   Pulse 61   Temp (!) 97.5 F (36.4 C)   Ht '5\' 10"'$  (1.778 m)   Wt 150 lb 9.6 oz (68.3 kg)   SpO2 97%   BMI 21.61 kg/m  GENERAL: Thin, fit appearing gentleman, looks younger than stated age, fully ambulatory, conversational dyspnea HEAD: Normocephalic, atraumatic.  EYES: Pupils equal, round, reactive to light.  No scleral icterus.   Strabismus with exotropia of right eye. MOUTH: Dentition intact, oral mucosa moist.  No thrush. NECK: Supple. No thyromegaly. Trachea midline. No JVD.  No adenopathy. PULMONARY: Good air entry bilaterally.  Faint end expiratory wheezes noted throughout. CARDIOVASCULAR: S1 and S2. Regular rate and rhythm.  No rubs, murmurs or gallops heard. ABDOMEN: Benign. MUSCULOSKELETAL: No joint deformity, no clubbing, no edema.  NEUROLOGIC: No overt focal deficit, no gait disturbance, speech is fluent. SKIN: Intact,warm,dry. PSYCH: Mood and behavior normal.  Lab Results  Component Value Date   NITRICOXIDE 58 08/03/2022   Chest x-ray from 25 May 2022 on independent review hyperinflation is noted:     Assessment & Plan:    Orders Placed This Encounter  Procedures  . Allergen Panel (27) + IGE    Standing Status:   Future    Number of Occurrences:   1    Standing Expiration Date:   02/02/2023  . Alpha-1 antitrypsin phenotype    Standing Status:   Future    Number of Occurrences:   1    Standing Expiration Date:   08/04/2023  . CBC with Differential/Platelet    Standing Status:   Future    Number of Occurrences:   1    Standing Expiration Date:   08/04/2023  . Nitric oxide  . Pulmonary Function Test [facility] Only    Standing Status:   Future    Number of Occurrences:   1    Standing Expiration Date:   08/04/2023    Order Specific Question:   Full PFT: includes the following: basic spirometry, spirometry pre & post bronchodilator, diffusion capacity (DLCO), lung volumes    Answer:   Full PFT    Order Specific Question:   This test can only be performed at    Answer:   AKnappordered this encounter  Medications  . Fluticasone-Umeclidin-Vilant (TRELEGY ELLIPTA) 200-62.5-25 MCG/ACT AEPB    Sig: Inhale 1 puff into the lungs daily.    Dispense:  28 each    Refill:  0    Order Specific Question:   Lot Number?    Answer:   [id number]    Order Specific Question:   Expiration Date?     Answer:   12/08/2023    Order Specific Question:   Quantity    Answer:   2  . Fluticasone-Umeclidin-Vilant (TRELEGY ELLIPTA) 200-62.5-25 MCG/ACT AEPB    Sig: Inhale 1 puff into the lungs daily.    Dispense:  28  each    Refill:  0

## 2022-09-13 NOTE — Patient Instructions (Signed)
Your lung function is stable, your level of inflammation in the airway is low which is good.  We are switching you from the Trelegy back to Breo 200, 1 inhalation daily.  Make sure you rinse your mouth well after you use it.  I do recommend that you get the RSV vaccine.  You can get this at either [facility] or [facility].  This is a virus that has been going around and affects children and older adults.  See you in follow-up in 4 to 6 months time call sooner should any new difficulties arise.

## 2022-09-13 NOTE — Progress Notes (Incomplete)
Subjective:    Patient ID: Jose Perry, male    DOB: 1941-07-26, 81 y.o.   MRN: [id number] Patient Care Team: Einar Pheasant, MD as PCP - General (Internal Medicine) Tyler Pita, MD as Consulting Physician (Pulmonary Disease)  Chief Complaint  Patient presents with  . Follow-up    Asthma. No SOB, wheezing or cough.    HPI    Review of Systems A 10 point review of systems was performed and it is as noted above otherwise negative.  Past Medical History:  Diagnosis Date  . Asthma   . Dyspnea   . Elevated blood pressure   . Enlarged prostate   . GERD (gastroesophageal reflux disease)   . Glaucoma   . History of adenomatous polyp of colon   . Hx of colonic polyps   . Hypertension   . Leukopenia    has had an extensive w/up.    . Liver hemangioma 01/24/2018  . Thyroid goiter    Social History   Tobacco Use  . Smoking status: Never  . Smokeless tobacco: Never  Substance Use Topics  . Alcohol use: No    Alcohol/week: 0.0 standard drinks of alcohol   Allergies  Allergen Reactions  . Brimonidine     Other reaction(s): Eye Redness  . Brimonidine Tartrate     Other reaction(s): Eye redness  . Brinzolamide-Brimonidine     Other reaction(s): Eye redness  . Other Other (See Comments)    Simbrinza Eye Drops gives pt Eye redness   Current Meds  Medication Sig  . albuterol (VENTOLIN HFA) 108 (90 Base) MCG/ACT inhaler INHALE 2 PUFFS BY MOUTH EVERY 6 HOURS AS NEEDED  . amLODipine (NORVASC) 5 MG tablet TAKE 1 TABLET (5 MG TOTAL) BY MOUTH DAILY.  Marland Kitchen ASPIRIN 81 PO Take by mouth.  . ferrous sulfate 325 (65 FE) MG EC tablet Take 325 mg by mouth daily.  . finasteride (PROSCAR) 5 MG tablet Take 1 tablet (5 mg total) by mouth daily.  . fluticasone (FLONASE) 50 MCG/ACT nasal spray Place 2 sprays into both nostrils as needed.  . Fluticasone-Umeclidin-Vilant (TRELEGY ELLIPTA) 200-62.5-25 MCG/ACT AEPB Inhale 1 puff into the lungs daily.  Marland Kitchen latanoprost (XALATAN) 0.005 %  ophthalmic solution ADMINISTER 1 DROP TO THE RIGHT EYE NIGHTLY.  Marland Kitchen olmesartan (BENICAR) 20 MG tablet Take 0.5 tablets (10 mg total) by mouth daily.  Marland Kitchen omeprazole (PRILOSEC) 40 MG capsule TAKE 1 CAPSULE (40 MG TOTAL) BY MOUTH DAILY.  . rosuvastatin (CRESTOR) 10 MG tablet Take 1 tablet (10 mg total) by mouth daily.  . sucralfate (CARAFATE) 1 g tablet TAKE 1 TABLET (1 G TOTAL) BY MOUTH 4 TIMES A DAY WITH MEALS AND AT BEDTIME  . tamsulosin (FLOMAX) 0.4 MG CAPS capsule TAKE 1 CAPSULE BY MOUTH EVERY DAY  . timolol (TIMOPTIC) 0.5 % ophthalmic solution Place 1 drop into the right eye 2 (two) times daily.  . vitamin B-12 (CYANOCOBALAMIN) 1000 MCG tablet Take by mouth.   Immunization History  Administered Date(s) Administered  . Fluad Quad(high Dose 65+) 08/27/2020, 07/11/2021, 06/16/2022  . Influenza, High Dose Seasonal PF 06/13/2016, 08/20/2017, 07/31/2018, 08/22/2019  . Influenza,inj,Quad PF,6+ Mos 07/20/2015  . PFIZER Comirnaty(Gray Top)Covid-19 Tri-Sucrose Vaccine 02/26/2021, 06/28/2022  . PFIZER(Purple Top)SARS-COV-2 Vaccination 10/30/2019, 11/20/2019, 07/05/2020  . Pension scheme manager 66yr & up 07/01/2021  . Pneumococcal Conjugate-13 06/10/2015  . Pneumococcal Polysaccharide-23 06/30/2013  . Tdap 10/25/2021  . Zoster Recombinat (Shingrix) 07/06/2018, 08/15/2021  . Zoster, Live 06/30/2013  Objective:   Physical Exam BP 130/84 (BP Location: Left Arm, Cuff Size: Normal)   Pulse (!) 58   Ht '5\' 10"'$  (1.778 m)   Wt 152 lb 6.4 oz (69.1 kg)   SpO2 98%   BMI 21.87 kg/m  GENERAL: HEAD: Normocephalic, atraumatic.  EYES: Pupils equal, round, reactive to light.  No scleral icterus.  MOUTH:  NECK: Supple. No thyromegaly. Trachea midline. No JVD.  No adenopathy. PULMONARY: Good air entry bilaterally.  No adventitious sounds. CARDIOVASCULAR: S1 and S2. Regular rate and rhythm.  ABDOMEN: MUSCULOSKELETAL: No joint deformity, no clubbing, no edema.  NEUROLOGIC:  SKIN:  Intact,warm,dry. PSYCH:       Assessment & Plan:

## 2022-09-17 ENCOUNTER — Other Ambulatory Visit: Payer: Self-pay | Admitting: Internal Medicine

## 2022-09-19 ENCOUNTER — Telehealth: Payer: Self-pay | Admitting: Pulmonary Disease

## 2022-09-19 NOTE — Telephone Encounter (Signed)
Patient is aware of below message and voiced his understanding.  Nothing further needed.   

## 2022-09-19 NOTE — Telephone Encounter (Signed)
The reason why it is Breo 200 is because his level of inflammation was still high at his last visit it was 58.  He needs to have a higher anti-inflammatory which is the 200 number.

## 2022-09-19 NOTE — Telephone Encounter (Signed)
Called and spoke to patient.  He stated at last visit. He suggested to switch from trelegy to [facility]. The Rx was sent in for Breo 200. He would like to try Breo 100 as prescribed previously.  Dr. Patsey Berthold, please advise. Thanks

## 2022-09-21 ENCOUNTER — Encounter: Payer: Self-pay | Admitting: Pulmonary Disease

## 2022-09-21 NOTE — Progress Notes (Signed)
Subjective:    Patient ID: Jose Perry, male    DOB: 1941-07-23, 81 y.o.   MRN: [id number] Patient Care Team: Einar Pheasant, MD as PCP - General (Internal Medicine) Tyler Pita, MD as Consulting Physician (Pulmonary Disease)  Chief Complaint  Patient presents with   Follow-up    Asthma. No SOB, wheezing or cough.   HPI Patient is an 81 year old lifelong never smoker with medical history as noted below, who presents for follow-up of shortness of breath.  Most recently seen the patient on 26 October and reiterated to him that he has asthma.  He has poor understanding of his disease process.  During his last visit here he was instructed on the proper use of his asthma controller he was switched to Trelegy Ellipta.  Submitted significant type II inflammation with nitric oxide at 57.  He states that he felt that the Trelegy was working well but no different than the [employer].  He has had no chest pain, no orthopnea or paroxysmal nocturnal dyspnea.  No lower extremity edema.  No calf tenderness.  He had allergen panel performed which showed sensitivities to multiple allergens but in particular grasses mold and some trees.  Also sensitivities to dust and dust mites.  Patient does not report any mold issues in the home.  This is prior visit his daily use of albuterol has decreased significantly to perhaps once to twice per week.  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  IgE was 325.  States that he wants to get back on Breo as he does not note difference between the [place] and the Trelegy Ellipta.  Trelegy is read on the fluticasone component.  DATA 07/11/2018 PFTs: FEV1 0.77 L or 31% predicted, FVC 1.52 L or 47% predicted, FEV1/FVC 51%, there was a 40% net change on FEV1 postbronchodilator.  There was air trapping but no hyperinflation noted on lung volumes.  Consistent with severe obstructive airways disease with significant bronchodilator  response 05/18/2022 coronary CTA: Lung windows nonrevealing. 05/25/2022 chest x-ray PA and lateral: Hyperinflation otherwise no abnormalities. Review of Systems A 10 point review of systems was performed and it is as noted above otherwise negative. 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  Pseudo restriction may be on the basis of air trapping.  Past Medical History:  Diagnosis Date   Asthma    Dyspnea    Elevated blood pressure    Enlarged prostate    GERD (gastroesophageal reflux disease)    Glaucoma    History of adenomatous polyp of colon    Hx of colonic polyps    Hypertension    Leukopenia    has had an extensive w/up.     Liver hemangioma 01/24/2018   Thyroid goiter    Social History   Tobacco Use   Smoking status: Never   Smokeless tobacco: Never  Substance Use Topics   Alcohol use: No    Alcohol/week: 0.0 standard drinks of alcohol   Allergies  Allergen Reactions   Brimonidine     Other reaction(s): Eye Redness   Brimonidine Tartrate     Other reaction(s): Eye redness   Brinzolamide-Brimonidine     Other reaction(s): Eye redness   Other Other (See Comments)    Simbrinza Eye Drops  gives pt Eye redness   Current Meds  Medication Sig   albuterol (VENTOLIN HFA) 108 (90 Base) MCG/ACT inhaler INHALE 2 PUFFS BY MOUTH EVERY 6 HOURS AS NEEDED   amLODipine (NORVASC) 5 MG tablet TAKE 1 TABLET (5 MG TOTAL) BY MOUTH DAILY.   ASPIRIN 81 PO Take by mouth.   ferrous sulfate 325 (65 FE) MG EC tablet Take 325 mg by mouth daily.   finasteride (PROSCAR) 5 MG tablet Take 1 tablet (5 mg total) by mouth daily.   fluticasone (FLONASE) 50 MCG/ACT nasal spray Place 2 sprays into both nostrils as needed.   fluticasone  furoate-vilanterol (BREO ELLIPTA) 200-25 MCG/ACT AEPB Inhale 1 puff into the lungs daily.   latanoprost (XALATAN) 0.005 % ophthalmic solution ADMINISTER 1 DROP TO THE RIGHT EYE NIGHTLY.   olmesartan (BENICAR) 20 MG tablet Take 0.5 tablets (10 mg total) by mouth daily.   omeprazole (PRILOSEC) 40 MG capsule TAKE 1 CAPSULE (40 MG TOTAL) BY MOUTH DAILY.   rosuvastatin (CRESTOR) 10 MG tablet Take 1 tablet (10 mg total) by mouth daily.   tamsulosin (FLOMAX) 0.4 MG CAPS capsule TAKE 1 CAPSULE BY MOUTH EVERY DAY   timolol (TIMOPTIC) 0.5 % ophthalmic solution Place 1 drop into the right eye 2 (two) times daily.   vitamin B-12 (CYANOCOBALAMIN) 1000 MCG tablet Take by mouth.   [DISCONTINUED] Fluticasone-Umeclidin-Vilant (TRELEGY ELLIPTA) 200-62.5-25 MCG/ACT AEPB Inhale 1 puff into the lungs daily.   [DISCONTINUED] sucralfate (CARAFATE) 1 g tablet TAKE 1 TABLET (1 G TOTAL) BY MOUTH 4 TIMES A DAY WITH MEALS AND AT BEDTIME   Immunization History  Administered Date(s) Administered   Fluad Quad(high Dose 65+) 08/27/2020, 07/11/2021, 06/16/2022   Influenza, High Dose Seasonal PF 06/13/2016, 08/20/2017, 07/31/2018, 08/22/2019   Influenza,inj,Quad PF,6+ Mos 07/20/2015   PFIZER Comirnaty(Gray Top)Covid-19 Tri-Sucrose Vaccine 02/26/2021, 06/28/2022   PFIZER(Purple Top)SARS-COV-2 Vaccination 10/30/2019, 11/20/2019, 07/05/2020   Pfizer Covid-19 Vaccine Bivalent Booster 78yr & up 07/01/2021   Pneumococcal Conjugate-13 06/10/2015   Pneumococcal Polysaccharide-23 06/30/2013   Tdap 10/25/2021   Zoster Recombinat (Shingrix) 07/06/2018, 08/15/2021   Zoster, Live 06/30/2013       Objective:   Physical Exam BP 130/84 (BP Location: Left Arm, Cuff Size: Normal)   Pulse (!) 58   Ht '5\' 10"'$  (1.778 m)   Wt 152 lb 6.4 oz (69.1 kg)   SpO2 98%   BMI 21.87 kg/m  GENERAL: Thin, fit appearing gentleman, looks younger than stated age, fully ambulatory, no conversational dyspnea HEAD: Normocephalic, atraumatic.  EYES:  Pupils equal, round, reactive to light.  No scleral icterus.  Strabismus with exotropia of right eye. MOUTH: Dentition intact, oral mucosa moist.  No thrush. NECK: Supple. No thyromegaly. Trachea midline. No JVD.  No adenopathy. PULMONARY: Good air entry bilaterally.  No adventitious sounds. CARDIOVASCULAR: S1 and S2. Regular rate and rhythm.  No rubs, murmurs or gallops heard. ABDOMEN: Benign. MUSCULOSKELETAL: No joint deformity, no clubbing, no edema.  NEUROLOGIC: No overt focal deficit, no gait disturbance, speech is fluent. SKIN: Intact,warm,dry. PSYCH: Mood and behavior normal.  Recent Results (from the past 2160 hour(s))  Basic metabolic panel     Status: Abnormal   Collection Time: 07/10/22  9:33 AM  Result Value Ref Range   Sodium 138 135 - 145 mEq/L   Potassium 4.2 3.5 - 5.1 mEq/L   Chloride 103 96 - 112 mEq/L   CO2 28 19 - 32 mEq/L   Glucose, Bld 80 70 - 99 mg/dL   BUN 14 6 - 23 mg/dL  Creatinine, Ser 1.33 0.40 - 1.50 mg/dL   GFR 50.50 (L) >60.00 mL/min    Comment: Calculated using the CKD-EPI Creatinine Equation (2021)   Calcium 9.6 8.4 - 10.5 mg/dL  Bladder Scan (Post Void Residual) in office     Status: None   Collection Time: 08/02/22  2:03 PM  Result Value Ref Range   Scan Result 207   Nitric oxide     Status: None   Collection Time: 08/03/22 10:24 AM  Result Value Ref Range   Nitric Oxide 58   CBC with Differential/Platelet     Status: Abnormal   Collection Time: 08/25/22  2:28 PM  Result Value Ref Range   WBC 3.1 (L) 3.4 - 10.8 x10E3/uL   RBC 4.70 4.14 - 5.80 x10E6/uL   Hemoglobin 13.0 13.0 - 17.7 g/dL   Hematocrit 39.8 37.5 - 51.0 %   MCV 85 79 - 97 fL   MCH 27.7 26.6 - 33.0 pg   MCHC 32.7 31.5 - 35.7 g/dL   RDW 13.5 11.6 - 15.4 %   Platelets 152 150 - 450 x10E3/uL   Neutrophils 54 Not Estab. %   Lymphs 26 Not Estab. %   Monocytes 10 Not Estab. %   Eos 9 Not Estab. %   Basos 1 Not Estab. %   Neutrophils Absolute 1.7 1.4 - 7.0 x10E3/uL    Lymphocytes Absolute 0.8 0.7 - 3.1 x10E3/uL   Monocytes Absolute 0.3 0.1 - 0.9 x10E3/uL   EOS (ABSOLUTE) 0.3 0.0 - 0.4 x10E3/uL   Basophils Absolute 0.0 0.0 - 0.2 x10E3/uL   Immature Granulocytes 0 Not Estab. %   Immature Grans (Abs) 0.0 0.0 - 0.1 x10E3/uL  Alpha-1 antitrypsin phenotype     Status: None   Collection Time: 08/25/22  2:28 PM  Result Value Ref Range   A-1 Antitrypsin, Ser 124 83 - 199 mg/dL   ALPHA-1-ANTITRYPSIN (AAT) PHENOTYPE SEE NOTE     Comment: THIS PATIENT'S ALPHA-1-ANTITRYPSIN PHENOTYPE IS PI*MM. Marland Kitchen 90% of normal individuals have the MM phenotype, with normal quantitative AAT levels. Many phenotypic patterns have been described, including deficiency states with F, S, Z, or other alleles. As a general estimation, compared to M allele of 100% of normal A-1-Antitrypsin protein, the S allele produces approximately 60% and the Z allele 20%. For example, an MS phenotype would have about 80% of normal A-1-Antitrypsin protein level, a 50% contribution from the M allele and 30% from the S allele. A ZZ phenotype would have about 20% of normal levels, a 10% contribution from each Z gene. The F allele has normal A-1-Antitrypsin levels, but the kinetics of elastase inhibition is not as efficient as an M allele product; F alleles should be considered functionally mildly deficient. Other variants are identifiable by phenotypic analysis. These include CM, DP, EM, GM, IS, LM, M1M2, M3M3, MP, MT, XX, MY, and M1N. I, P, T and  null alleles are considered deleterious. C, D, E, G, L, M1, M2, M3, X and Y alleles are generally considered normal variants. The MZ-Pratt phenotype is a normal variant; care should be taken to avoid confusion with the deficient MZ phenotype.   Allergen Panel (27) + IGE     Status: Abnormal   Collection Time: 08/25/22  2:28 PM  Result Value Ref Range   Class Description Allergens Comment     Comment:     Levels of Specific IgE       Class  Description of  Class     ---------------------------  -----  --------------------                    <  0.10         0         Negative            0.10 -    0.31         0/I       Equivocal/Low            0.32 -    0.55         I         Low            0.56 -    1.40         II        Moderate            1.41 -    3.90         III       High            3.91 -   19.00         IV        Very High           19.01 -  100.00         V         Very High                   >100.00         VI        Very High    IgE (Immunoglobulin E), Serum 325 6 - 495 IU/mL   D Pteronyssinus IgE 0.55 (A) Class I kU/L   D Farinae IgE 0.46 (A) Class I kU/L   Cat Dander IgE 0.12 (A) Class 0/I kU/L   Dog Dander IgE <0.10 Class 0 kU/L   [place] Grass IgE 0.27 (A) Class 0/I kU/L   Timothy Grass IgE 0.84 (A) Class II kU/L   [place] Bluegrass IgE 1.91 (A) Class III kU/L   [place] Grass IgE 0.33 (A) Class I kU/L   [place] Grass IgE 0.48 (A) Class I kU/L   Cockroach, American IgE <0.10 Class 0 kU/L   Penicillium Chrysogen IgE 0.36 (A) Class I kU/L   Cladosporium Herbarum IgE 0.93 (A) Class II kU/L   Aspergillus Fumigatus IgE 2.52 (A) Class III kU/L   Mucor Racemosus IgE 0.13 (A) Class 0/I kU/L   Alternaria Alternata IgE 14.30 (A) Class IV kU/L   Setomelanomma Rostrat 2.61 (A) Class III kU/L   Oak, White IgE 0.99 (A) Class II kU/L   Elm, American IgE 0.41 (A) Class I kU/L   Maple/Box Elder IgE 0.11 (A) Class 0/I kU/L   Common Silver Wendee Copp IgE 1.84 (A) Class III kU/L   Hickory, White IgE 0.89 (A) Class II kU/L   White Mulberry IgE <0.10 Class 0 kU/L   Cedar, [place] IgE <0.10 Class 0 kU/L   Ragweed, Short IgE 0.38 (A) Class I kU/L   Plantain, English IgE <0.10 Class 0 kU/L   Cocklebur IgE 0.22 (A) Class 0/I kU/L   Pigweed, Rough IgE <0.10 Class 0 kU/L  Pulmonary Function Test [facility] Only     Status: None   Collection Time: 09/12/22  2:54 PM  Result Value Ref Range   FVC-Pre 1.93 L   FVC-%Pred-Pre 47 %   FVC-Post 1.95 L    FVC-%Pred-Post 48 %   FVC-%Change-Post 1 %   FEV1-Pre 1.28 L   FEV1-%Pred-Pre 44 %   FEV1-Post 1.28 L  FEV1-%Pred-Post 44 %   FEV1-%Change-Post 0 %   FEV6-Pre 1.93 L   FEV6-%Pred-Pre 51 %   FEV6-Post 1.92 L   FEV6-%Pred-Post 50 %   FEV6-%Change-Post 0 %   Pre FEV1/FVC ratio 66 %   FEV1FVC-%Pred-Pre 92 %   Post FEV1/FVC ratio 65 %   FEV1FVC-%Change-Post -1 %   Pre FEV6/FVC Ratio 100 %   FEV6FVC-%Pred-Pre 107 %   Post FEV6/FVC ratio 99 %   FEV6FVC-%Pred-Post 105 %   FEV6FVC-%Change-Post -1 %   FEF 25-75 Pre 0.74 L/sec   FEF2575-%Pred-Pre 37 %   FEF 25-75 Post 0.77 L/sec   FEF2575-%Pred-Post 38 %   FEF2575-%Change-Post 3 %   RV 3.17 L   RV % pred 119 %   TLC 5.27 L   TLC % pred 74 %   DLCO unc 20.15 ml/min/mmHg   DLCO unc % pred 82 %   DL/VA 4.49 ml/min/mmHg/L   DL/VA % pred 115 %  Nitric oxide     Status: None   Collection Time: 09/13/22  9:17 AM  Result Value Ref Range   Nitric Oxide 27   Decrease in type II inflammation from prior.    Assessment & Plan:     ICD-10-CM   1. Severe persistent asthma, unspecified whether complicated  X79.39 Nitric oxide   Nitric oxide down to 27 Will switch back to Breo 200, 1 puff daily Continue as needed albuterol Reiterated importance of maintenance medication    2. Environmental allergies  Z91.09    Multiple allergies Particularly to molds Referral to allergy/immunology    3. SOB (shortness of breath)  R06.02    Markedly improved    4. Non compliance w medication regimen  Z91.148    Poor understanding of disease process Reiterated pathophysiology in layman's terms     Orders Placed This Encounter  Procedures   Nitric oxide   Meds ordered this encounter  Medications   fluticasone furoate-vilanterol (BREO ELLIPTA) 200-25 MCG/ACT AEPB    Sig: Inhale 1 puff into the lungs daily.    Dispense:  28 each    Refill:  11   Discussed pulmonary function testing with the patient.  Results show that he is likely developing  some airway remodeling which accounts for the decrease in the responsiveness to bronchodilators.  It was reiterated to him that he needs to stay on controller medication.  We will switch back to [facility] however we will continue the 200 mcg dose of fluticasone as the patient has exhibited significant airway inflammation.   Patient is interested in being evaluated by allergy/immunology so we will refer.  See the patient in follow-up in 4 to 6 months time he is to contact us prior to that time should any new difficulties arise.  Renold Don, MD Advanced Bronchoscopy [facility]    *This note was dictated using voice recognition software/Dragon.  Despite best efforts to proofread, errors can occur which can change the meaning. Any transcriptional errors that result from this process are unintentional and may not be fully corrected at the time of dictation.

## 2022-10-11 DIAGNOSIS — E042 Nontoxic multinodular goiter: Secondary | ICD-10-CM | POA: Diagnosis not present

## 2022-10-16 ENCOUNTER — Other Ambulatory Visit: Payer: Self-pay | Admitting: Family

## 2022-10-17 ENCOUNTER — Other Ambulatory Visit: Payer: Self-pay | Admitting: Internal Medicine

## 2022-10-18 ENCOUNTER — Encounter: Payer: Self-pay | Admitting: Otolaryngology

## 2022-10-18 DIAGNOSIS — E042 Nontoxic multinodular goiter: Secondary | ICD-10-CM | POA: Diagnosis not present

## 2022-10-18 DIAGNOSIS — J301 Allergic rhinitis due to pollen: Secondary | ICD-10-CM | POA: Diagnosis not present

## 2022-10-27 ENCOUNTER — Ambulatory Visit (INDEPENDENT_AMBULATORY_CARE_PROVIDER_SITE_OTHER): Payer: Medicare Other

## 2022-10-27 VITALS — Ht 70.0 in | Wt 152.0 lb

## 2022-10-27 DIAGNOSIS — Z0000 Encounter for general adult medical examination without abnormal findings: Secondary | ICD-10-CM

## 2022-10-27 NOTE — Patient Instructions (Addendum)
Jose Perry , Thank you for taking time to come for your Medicare Wellness Visit. I appreciate your ongoing commitment to your health goals. Please review the following plan we discussed and let me know if I can assist you in the future.   These are the goals we discussed:  Goals      Maintain Healthy Lifestyle     Stay active Healthy diet Stay hydrated          This is a list of the screening recommended for you and due dates:  Health Maintenance  Topic Date Due   COVID-19 Vaccine (7 - 2023-24 season) 11/12/2022*   Medicare Annual Wellness Visit  10/28/2023   DTaP/Tdap/Td vaccine (2 - Td or Tdap) 10/26/2031   Pneumonia Vaccine  Completed   Flu Shot  Completed   Zoster (Shingles) Vaccine  Completed   HPV Vaccine  Aged Out  *Topic was postponed. The date shown is not the original due date.    Advanced directives: none at this time  Conditions/risks identified: none new  Next appointment: Follow up in one year for your annual wellness visit.   Preventive Care 81 Years and Older, Male  Preventive care refers to lifestyle choices and visits with your health care provider that can promote health and wellness. What does preventive care include? A yearly physical exam. This is also called an annual well check. Dental exams once or twice a year. Routine eye exams. Ask your health care provider how often you should have your eyes checked. Personal lifestyle choices, including: Daily care of your teeth and gums. Regular physical activity. Eating a healthy diet. Avoiding tobacco and drug use. Limiting alcohol use. Practicing safe sex. Taking low doses of aspirin every day. Taking vitamin and mineral supplements as recommended by your health care provider. What happens during an annual well check? The services and screenings done by your health care provider during your annual well check will depend on your age, overall health, lifestyle risk factors, and family history of  disease. Counseling  Your health care provider may ask you questions about your: Alcohol use. Tobacco use. Drug use. Emotional well-being. Home and relationship well-being. Sexual activity. Eating habits. History of falls. Memory and ability to understand (cognition). Work and work Statistician. Screening  You may have the following tests or measurements: Height, weight, and BMI. Blood pressure. Lipid and cholesterol levels. These may be checked every 5 years, or more frequently if you are over 25 years old. Skin check. Lung cancer screening. You may have this screening every year starting at age 18 if you have a 30-pack-year history of smoking and currently smoke or have quit within the past 15 years. Fecal occult blood test (FOBT) of the stool. You may have this test every year starting at age 69. Flexible sigmoidoscopy or colonoscopy. You may have a sigmoidoscopy every 5 years or a colonoscopy every 10 years starting at age 8. Prostate cancer screening. Recommendations will vary depending on your family history and other risks. Hepatitis C blood test. Hepatitis B blood test. Sexually transmitted disease (STD) testing. Diabetes screening. This is done by checking your blood sugar (glucose) after you have not eaten for a while (fasting). You may have this done every 1-3 years. Abdominal aortic aneurysm (AAA) screening. You may need this if you are a current or former smoker. Osteoporosis. You may be screened starting at age 62 if you are at high risk. Talk with your health care provider about your test results, treatment  options, and if necessary, the need for more tests. Vaccines  Your health care provider may recommend certain vaccines, such as: Influenza vaccine. This is recommended every year. Tetanus, diphtheria, and acellular pertussis (Tdap, Td) vaccine. You may need a Td booster every 10 years. Zoster vaccine. You may need this after age 52. Pneumococcal 13-valent  conjugate (PCV13) vaccine. One dose is recommended after age 58. Pneumococcal polysaccharide (PPSV23) vaccine. One dose is recommended after age 31. Talk to your health care provider about which screenings and vaccines you need and how often you need them. This information is not intended to replace advice given to you by your health care provider. Make sure you discuss any questions you have with your health care provider. Document Released: 10/22/2015 Document Revised: 06/14/2016 Document Reviewed: 07/27/2015 Elsevier Interactive Patient Education  2017 [place] Prevention in the Home Falls can cause injuries. They can happen to people of all ages. There are many things you can do to make your home safe and to help prevent falls. What can I do on the outside of my home? Regularly fix the edges of walkways and driveways and fix any cracks. Remove anything that might make you trip as you walk through a door, such as a raised step or threshold. Trim any bushes or trees on the path to your home. Use bright outdoor lighting. Clear any walking paths of anything that might make someone trip, such as rocks or tools. Regularly check to see if handrails are loose or broken. Make sure that both sides of any steps have handrails. Any raised decks and porches should have guardrails on the edges. Have any leaves, snow, or ice cleared regularly. Use sand or salt on walking paths during winter. Clean up any spills in your garage right away. This includes oil or grease spills. What can I do in the bathroom? Use night lights. Install grab bars by the toilet and in the tub and shower. Do not use towel bars as grab bars. Use non-skid mats or decals in the tub or shower. If you need to sit down in the shower, use a plastic, non-slip stool. Keep the floor dry. Clean up any water that spills on the floor as soon as it happens. Remove soap buildup in the tub or shower regularly. Attach bath mats  securely with double-sided non-slip rug tape. Do not have throw rugs and other things on the floor that can make you trip. What can I do in the bedroom? Use night lights. Make sure that you have a light by your bed that is easy to reach. Do not use any sheets or blankets that are too big for your bed. They should not hang down onto the floor. Have a firm chair that has side arms. You can use this for support while you get dressed. Do not have throw rugs and other things on the floor that can make you trip. What can I do in the kitchen? Clean up any spills right away. Avoid walking on wet floors. Keep items that you use a lot in easy-to-reach places. If you need to reach something above you, use a strong step stool that has a grab bar. Keep electrical cords out of the way. Do not use floor polish or wax that makes floors slippery. If you must use wax, use non-skid floor wax. Do not have throw rugs and other things on the floor that can make you trip. What can I do with my stairs? Do not  leave any items on the stairs. Make sure that there are handrails on both sides of the stairs and use them. Fix handrails that are broken or loose. Make sure that handrails are as long as the stairways. Check any carpeting to make sure that it is firmly attached to the stairs. Fix any carpet that is loose or worn. Avoid having throw rugs at the top or bottom of the stairs. If you do have throw rugs, attach them to the floor with carpet tape. Make sure that you have a light switch at the top of the stairs and the bottom of the stairs. If you do not have them, ask someone to add them for you. What else can I do to help prevent falls? Wear shoes that: Do not have high heels. Have rubber bottoms. Are comfortable and fit you well. Are closed at the toe. Do not wear sandals. If you use a stepladder: Make sure that it is fully opened. Do not climb a closed stepladder. Make sure that both sides of the stepladder  are locked into place. Ask someone to hold it for you, if possible. Clearly mark and make sure that you can see: Any grab bars or handrails. First and last steps. Where the edge of each step is. Use tools that help you move around (mobility aids) if they are needed. These include: Canes. Walkers. Scooters. Crutches. Turn on the lights when you go into a dark area. Replace any light bulbs as soon as they burn out. Set up your furniture so you have a clear path. Avoid moving your furniture around. If any of your floors are uneven, fix them. If there are any pets around you, be aware of where they are. Review your medicines with your doctor. Some medicines can make you feel dizzy. This can increase your chance of falling. Ask your doctor what other things that you can do to help prevent falls. This information is not intended to replace advice given to you by your health care provider. Make sure you discuss any questions you have with your health care provider. Document Released: 07/22/2009 Document Revised: 03/02/2016 Document Reviewed: 10/30/2014 Elsevier Interactive Patient Education  2017 [employer].

## 2022-10-27 NOTE — Progress Notes (Signed)
Subjective:   Jose Perry is a 82 y.o. male who presents for Medicare Annual/Subsequent preventive examination.  Review of Systems    No ROS.  Medicare Wellness Virtual Visit.  Visual/audio telehealth visit, UTA vital signs.   See social history for additional risk factors.   Cardiac Risk Factors include: advanced age (>53mn, >>81women);male gender;hypertension     Objective:    Today's Vitals   10/27/22 1303  Weight: 152 lb (68.9 kg)  Height: '5\' 10"'$  (1.778 m)   Body mass index is 21.81 kg/m.     10/27/2022    1:03 PM 05/25/2022    5:29 PM 05/25/2022    4:37 PM 03/15/2022    1:18 AM 09/29/2020    1:09 PM 06/30/2020   10:17 AM 09/26/2019    8:43 AM  Advanced Directives  Does Patient Have a Medical Advance Directive? No No No No No Yes No  Type of ATeacher, early years/preLiving will   Does patient want to make changes to medical advance directive? No - Patient declined     No - Patient declined   Copy of HHerronin Chart?      No - copy requested   Would patient like information on creating a medical advance directive?    No - Patient declined No - Patient declined  No - Patient declined    Current Medications (verified) Outpatient Encounter Medications as of 10/27/2022  Medication Sig   albuterol (VENTOLIN HFA) 108 (90 Base) MCG/ACT inhaler INHALE 2 PUFFS BY MOUTH EVERY 6 HOURS AS NEEDED   amLODipine (NORVASC) 5 MG tablet TAKE 1 TABLET (5 MG TOTAL) BY MOUTH DAILY.   ASPIRIN 81 PO Take by mouth.   ferrous sulfate 325 (65 FE) MG EC tablet Take 325 mg by mouth daily.   finasteride (PROSCAR) 5 MG tablet Take 1 tablet (5 mg total) by mouth daily.   fluticasone (FLONASE) 50 MCG/ACT nasal spray Place 2 sprays into both nostrils as needed.   fluticasone furoate-vilanterol (BREO ELLIPTA) 200-25 MCG/ACT AEPB Inhale 1 puff into the lungs daily.   latanoprost (XALATAN) 0.005 % ophthalmic solution ADMINISTER 1 DROP TO THE RIGHT EYE  NIGHTLY.   metoprolol tartrate (LOPRESSOR) 50 MG tablet Take 1 tablet (50 mg) 2 hours prior to Cardiac CTA (Patient not taking: Reported on 09/13/2022)   olmesartan (BENICAR) 20 MG tablet Take 0.5 tablets (10 mg total) by mouth daily.   omeprazole (PRILOSEC) 40 MG capsule TAKE 1 CAPSULE (40 MG TOTAL) BY MOUTH DAILY.   rosuvastatin (CRESTOR) 10 MG tablet Take 1 tablet (10 mg total) by mouth daily.   sucralfate (CARAFATE) 1 g tablet TAKE 1 TABLET (1 G TOTAL) BY MOUTH 4 TIMES A DAY WITH MEALS AND AT BEDTIME   tamsulosin (FLOMAX) 0.4 MG CAPS capsule TAKE 1 CAPSULE BY MOUTH EVERY DAY   timolol (TIMOPTIC) 0.5 % ophthalmic solution Place 1 drop into the right eye 2 (two) times daily.   vitamin B-12 (CYANOCOBALAMIN) 1000 MCG tablet Take by mouth.   No facility-administered encounter medications on file as of 10/27/2022.    Allergies (verified) Brimonidine, Brimonidine tartrate, Brinzolamide-brimonidine, and Other   History: Past Medical History:  Diagnosis Date   Asthma    Dyspnea    Elevated blood pressure    Enlarged prostate    GERD (gastroesophageal reflux disease)    Glaucoma    History of adenomatous polyp of colon    Hx of  colonic polyps    Hypertension    Leukopenia    has had an extensive w/up.     Liver hemangioma 01/24/2018   Thyroid goiter    Past Surgical History:  Procedure Laterality Date   BACK SURGERY  2002   ruptured disc   CATARACT EXTRACTION W/PHACO Right 06/30/2020   Procedure: CATARACT EXTRACTION PHACO AND INTRAOCULAR LENS PLACEMENT (IOC) RIGHT, Malyugin 7.19 01:30.8 7.9%;  Surgeon: Leandrew Koyanagi, MD;  Location: [place];  Service: Ophthalmology;  Laterality: Right;   COLONOSCOPY W/ POLYPECTOMY  04/24/2005, 07/06/2010, 07/08/2014   COLONOSCOPY WITH PROPOFOL N/A 02/01/2018   Procedure: COLONOSCOPY WITH PROPOFOL;  Surgeon: Manya Silvas, MD;  Location: [facility] ENDOSCOPY;  Service: Endoscopy;  Laterality: N/A;   ESOPHAGOGASTRODUODENOSCOPY  04/24/2005,  07/06/2010,   ESOPHAGOGASTRODUODENOSCOPY (EGD) WITH PROPOFOL N/A 05/10/2022   Procedure: ESOPHAGOGASTRODUODENOSCOPY (EGD) WITH PROPOFOL;  Surgeon: Toledo, Benay Pike, MD;  Location: [facility] ENDOSCOPY;  Service: Gastroenterology;  Laterality: N/A;   EYE SURGERY  2009   relieve pressure from glaucoma   THYROID SURGERY  1968   goiter   Family History  Problem Relation Age of Onset   Heart disease Mother    Alcohol abuse Father    Hyperlipidemia Father    Stroke Father    Social History   Socioeconomic History   Marital status: Married    Spouse name: Not on file   Number of children: Not on file   Years of education: Not on file   Highest education level: Not on file  Occupational History   Not on file  Tobacco Use   Smoking status: Never   Smokeless tobacco: Never  Vaping Use   Vaping Use: Never used  Substance and Sexual Activity   Alcohol use: No    Alcohol/week: 0.0 standard drinks of alcohol   Drug use: No   Sexual activity: Yes  Other Topics Concern   Not on file  Social History Narrative   Not on file   Social Determinants of Health   Financial Resource Strain: Low Risk  (10/27/2022)   Overall Financial Resource Strain (CARDIA)    Difficulty of Paying Living Expenses: Not hard at all  Food Insecurity: No Food Insecurity (10/27/2022)   Hunger Vital Sign    Worried About Running Out of Food in the Last Year: Never true    [place] in the Last Year: Never true  Transportation Needs: No Transportation Needs (10/27/2022)   PRAPARE - Hydrologist (Medical): No    Lack of Transportation (Non-Medical): No  Physical Activity: Sufficiently Active (10/27/2022)   Exercise Vital Sign    Days of Exercise per Week: 7 days    Minutes of Exercise per Session: 60 min  Stress: No Stress Concern Present (10/27/2022)   [place]    Feeling of Stress : Not at all  Social Connections:  Unknown (10/27/2022)   Social Connection and Isolation Panel [NHANES]    Frequency of Communication with Friends and Family: Not on file    Frequency of Social Gatherings with Friends and Family: Not on file    Attends Religious Services: Not on file    Active Member of Clubs or Organizations: Not on file    Attends Archivist Meetings: Not on file    Marital Status: Married    Tobacco Counseling Counseling given: Not Answered   Clinical Intake:  Pre-visit preparation completed: Yes  Diabetes: No  How often do you need to have someone help you when you read instructions, pamphlets, or other written materials from your doctor or pharmacy?: 1 - Never    Interpreter Needed?: No      Activities of Daily Living    10/27/2022    1:05 PM  In your present state of health, do you have any difficulty performing the following activities:  Hearing? 0  Vision? 0  Difficulty concentrating or making decisions? 0  Walking or climbing stairs? 0  Dressing or bathing? 0  Doing errands, shopping? 0  Preparing Food and eating ? N  Using the Toilet? N  In the past six months, have you accidently leaked urine? N  Comment Followed by Urology annually  Do you have problems with loss of bowel control? N  Managing your Medications? N  Managing your Finances? N  Housekeeping or managing your Housekeeping? N    Patient Care Team: Einar Pheasant, MD as PCP - General (Internal Medicine) Tyler Pita, MD as Consulting Physician (Pulmonary Disease)  Indicate any recent Medical Services you may have received from other than [facility] providers in the past year (date may be approximate).     Assessment:   This is a routine wellness examination for Jose Perry.  I connected with  [name] on 10/27/22 by a audio enabled telemedicine application and verified that I am speaking with the correct person using two identifiers.  Patient Location: Home  Provider Location:  Office/Clinic  I discussed the limitations of evaluation and management by telemedicine. The patient expressed understanding and agreed to proceed.   Hearing/Vision screen Hearing Screening - Comments:: Patient is able to hear conversational tones without difficulty. No issues reported. Vision Screening - Comments:: Followed by [facility] Wears corrective lenses Cataract extraction, R eye Glaucoma; drops in use R eye They have seen their ophthalmologist every 3-4 months.    Dietary issues and exercise activities discussed: Current Exercise Habits: Home exercise routine, Type of exercise: strength training/weights;stretching, Time (Minutes): 60, Frequency (Times/Week): 7, Weekly Exercise (Minutes/Week): 420, Intensity: Mild   Goals Addressed   None    Depression Screen    10/27/2022    1:07 PM 08/25/2022    1:41 PM 03/22/2022   11:07 AM 10/14/2021    3:38 PM 07/11/2021    9:43 AM 09/29/2020    1:08 PM 09/26/2019    8:44 AM  PHQ 2/9 Scores  PHQ - 2 Score 0 0 0 0 0 0 0    Fall Risk    10/27/2022    1:07 PM 08/25/2022    1:41 PM 03/22/2022   11:07 AM 10/14/2021    3:41 PM 07/11/2021    9:43 AM  [place] in the past year? 0 0 0 0 0  Number falls in past yr: 0 0  0 0  Injury with Fall? 0 0  0 0  Risk for fall due to :  No Fall Risks No Fall Risks    Follow up Falls evaluation completed;Falls prevention discussed Falls evaluation completed Falls evaluation completed Falls evaluation completed Falls evaluation completed    FALL RISK PREVENTION PERTAINING TO THE HOME: Home free of loose throw rugs in walkways, pet beds, electrical cords, etc? Yes  Adequate lighting in your home to reduce risk of falls? Yes   ASSISTIVE DEVICES UTILIZED TO PREVENT FALLS: Life alert? No  Use of a cane, walker or w/c? No  Grab bars in the bathroom?  No  Shower chair or bench in shower? No  Elevated toilet seat or a handicapped toilet? No   TIMED UP AND GO: Was the test performed?  No .    Cognitive Function:    08/23/2018    9:30 AM 08/20/2017    4:38 PM 08/18/2016    4:27 PM  MMSE - Mini Mental State Exam  Orientation to time '5 5 5  '$ Orientation to Place '5 5 5  '$ Registration '3 3 3  '$ Attention/ Calculation '5 5 5  '$ Recall '3 3 3  '$ Language- name 2 objects '2 2 2  '$ Language- repeat '1 1 1  '$ Language- follow 3 step command '3 3 3  '$ Language- read & follow direction '1 1 1  '$ Write a sentence '1 1 1  '$ Copy design '1 1 1  '$ Total score '30 30 30        '$ 10/27/2022    1:15 PM 09/29/2020    1:30 PM 09/26/2019    8:47 AM 08/18/2016    4:31 PM  6CIT Screen  What Year? 0 points 0 points 0 points 0 points  What month? 0 points 0 points 0 points 0 points  What time? 0 points 0 points 0 points 0 points  Count back from 20  0 points 0 points 0 points  Months in reverse 0 points 0 points 0 points 0 points  Repeat phrase  0 points 0 points   Total Score  0 points 0 points     Immunizations Immunization History  Administered Date(s) Administered   Fluad Quad(high Dose 65+) 08/27/2020, 07/11/2021, 06/16/2022   Influenza, High Dose Seasonal PF 06/13/2016, 08/20/2017, 07/31/2018, 08/22/2019   Influenza,inj,Quad PF,6+ Mos 07/20/2015   PFIZER Comirnaty(Gray Top)Covid-19 Tri-Sucrose Vaccine 02/26/2021, 06/28/2022   PFIZER(Purple Top)SARS-COV-2 Vaccination 10/30/2019, 11/20/2019, 07/05/2020   Pfizer Covid-19 Vaccine Bivalent Booster 13yr & up 07/01/2021   Pneumococcal Conjugate-13 06/10/2015   Pneumococcal Polysaccharide-23 06/30/2013   Respiratory Syncytial Virus Vaccine,Recomb Aduvanted(Arexvy) 10/07/2022   Tdap 10/25/2021   Zoster Recombinat (Shingrix) 07/06/2018, 08/15/2021   Zoster, Live 06/30/2013   Screening Tests Health Maintenance  Topic Date Due   COVID-19 Vaccine (7 - 2023-24 season) 11/12/2022 (Originally 08/23/2022)   Medicare Annual Wellness (AWV)  10/28/2023   DTaP/Tdap/Td (2 - Td or Tdap) 10/26/2031   Pneumonia Vaccine 82 Years old  Completed   INFLUENZA  VACCINE  Completed   Zoster Vaccines- Shingrix  Completed   HPV VACCINES  Aged Out   Health Maintenance There are no preventive care reminders to display for this patient.  Lung Cancer Screening: (Low Dose CT Chest recommended if Age 81-80years, 30 pack-year currently smoking OR have quit w/in 15years.) does not qualify.   Hepatitis C Screening: does not qualify.  Vision Screening: Recommended annual ophthalmology exams for early detection of glaucoma and other disorders of the eye.  Dental Screening: Recommended annual dental exams for proper oral hygiene  Community Resource Referral / Chronic Care Management: CRR required this visit?  No   CCM required this visit?  No      Plan:     I have personally reviewed and noted the following in the patient's chart:   Medical and social history Use of alcohol, tobacco or illicit drugs  Current medications and supplements including opioid prescriptions. Patient is not currently taking opioid prescriptions. Functional ability and status Nutritional status Physical activity Advanced directives List of other physicians Hospitalizations, surgeries, and ER visits in previous 12 months Vitals Screenings to include cognitive, depression, and falls  Referrals and appointments  In addition, I have reviewed and discussed with patient certain preventive protocols, quality metrics, and best practice recommendations. A written personalized care plan for preventive services as well as general preventive health recommendations were provided to patient.     Leta Jungling, LPN   1/85/6314

## 2022-11-02 ENCOUNTER — Other Ambulatory Visit: Payer: Self-pay | Admitting: Family

## 2022-11-05 ENCOUNTER — Other Ambulatory Visit: Payer: Self-pay | Admitting: Internal Medicine

## 2022-11-10 ENCOUNTER — Other Ambulatory Visit: Payer: Self-pay

## 2022-11-10 ENCOUNTER — Telehealth: Payer: Self-pay | Admitting: Internal Medicine

## 2022-11-10 MED ORDER — AMLODIPINE BESYLATE 5 MG PO TABS: 5.0000 mg | ORAL_TABLET | Freq: Every day | ORAL | 1 refills | Status: DC

## 2022-11-10 NOTE — Telephone Encounter (Signed)
Prescription Request  11/10/2022  Is this a "Controlled Substance" medicine? No  LOV: 08/25/2022  What is the name of the medication or equipment? amLODipine (NORVASC) 5 MG tablet and olmesartan (BENICAR) 20 MG tablet  Have you contacted your pharmacy to request a refill? Yes   Which pharmacy would you like this sent to?  [id number]Phone: [phone]: [phone]   Patient notified that their request is being sent to the clinical staff for review and that they should receive a response within 2 business days.   Please advise at [place]

## 2022-11-10 NOTE — Telephone Encounter (Signed)
sent

## 2022-11-14 ENCOUNTER — Other Ambulatory Visit: Payer: Self-pay | Admitting: Internal Medicine

## 2022-12-29 ENCOUNTER — Ambulatory Visit: Payer: Medicare Other | Attending: Cardiology | Admitting: Cardiology

## 2022-12-29 ENCOUNTER — Encounter: Payer: Self-pay | Admitting: Cardiology

## 2022-12-29 VITALS — BP 140/82 | HR 53 | Ht 70.0 in | Wt 151.1 lb

## 2022-12-29 DIAGNOSIS — E7800 Pure hypercholesterolemia, unspecified: Secondary | ICD-10-CM | POA: Diagnosis not present

## 2022-12-29 DIAGNOSIS — I2510 Atherosclerotic heart disease of native coronary artery without angina pectoris: Secondary | ICD-10-CM | POA: Insufficient documentation

## 2022-12-29 DIAGNOSIS — I10 Essential (primary) hypertension: Secondary | ICD-10-CM | POA: Diagnosis not present

## 2022-12-29 MED ORDER — OLMESARTAN MEDOXOMIL 20 MG PO TABS: 20.0000 mg | ORAL_TABLET | Freq: Every day | ORAL | 0 refills | Status: DC

## 2022-12-29 NOTE — Progress Notes (Signed)
Cardiology Office Note:    Date:  12/29/2022   ID:  Jose, Perry [birth year], MRN [id number]  PCP:  Jose Perry, Jose Perry Providers Cardiologist:  Jose Sable, MD     Referring MD: Jose Pheasant, MD   Chief Complaint  Patient presents with   6 month follow up     "Doing well." Medications reviewed by the patient verbally.     History of Present Illness:    Jose Perry is a 82 y.o. male with a hx of Mild nonobstructive CAD (proximal LAD, D1, RCA ) coronary CT 8/23, hypertension, hyperlipidemia, GERD who presents for follow-up.  Previously seen due to chest pain and shortness of breath.  Echo and coronary CTA showed normal function, nonobstructive CAD respectively.  Started on aspirin, Crestor titrated to 10 mg daily.  He feels well, tolerating medicines as prescribed, no concerns at this time.  BP is elevated at home with systolics in the [id number].  Prior notes Echo 06/2022 EF 55 to 60% Coronary CT 05/2022 mild nonobstructive CAD in proximal LAD, D1, RCA.  Past Medical History:  Diagnosis Date   Asthma    Dyspnea    Elevated blood pressure    Enlarged prostate    GERD (gastroesophageal reflux disease)    Glaucoma    History of adenomatous polyp of colon    Hx of colonic polyps    Hypertension    Leukopenia    has had an extensive w/up.     Liver hemangioma 01/24/2018   Thyroid goiter     Past Surgical History:  Procedure Laterality Date   BACK SURGERY  2002   ruptured disc   CATARACT EXTRACTION W/PHACO Right 06/30/2020   Procedure: CATARACT EXTRACTION PHACO AND INTRAOCULAR LENS PLACEMENT (IOC) RIGHT, Malyugin 7.19 01:30.8 7.9%;  Surgeon: Jose Koyanagi, MD;  Location: [place];  Service: Ophthalmology;  Laterality: Right;   COLONOSCOPY W/ POLYPECTOMY  04/24/2005, 07/06/2010, 07/08/2014   COLONOSCOPY WITH PROPOFOL N/A 02/01/2018   Procedure: COLONOSCOPY WITH PROPOFOL;  Surgeon: Jose Silvas, MD;  [facility]  ENDOSCOPY;  Service: Endoscopy;  Laterality: N/A;   ESOPHAGOGASTRODUODENOSCOPY  04/24/2005, 07/06/2010,   ESOPHAGOGASTRODUODENOSCOPY (EGD) WITH PROPOFOL N/A 05/10/2022   Procedure: ESOPHAGOGASTRODUODENOSCOPY (EGD) WITH PROPOFOL;  Surgeon: Toledo, Benay Pike, MD;  Location: [facility] ENDOSCOPY;  Service: Gastroenterology;  Laterality: N/A;   EYE SURGERY  2009   relieve pressure from glaucoma   THYROID SURGERY  1968   goiter    Current Medications: Current Meds  Medication Sig   albuterol (VENTOLIN HFA) 108 (90 Base) MCG/ACT inhaler INHALE 2 PUFFS BY MOUTH EVERY 6 HOURS AS NEEDED   amLODipine (NORVASC) 5 MG tablet Take 1 tablet (5 mg total) by mouth daily.   ASPIRIN 81 PO Take by mouth.   ferrous sulfate 325 (65 FE) MG EC tablet Take 325 mg by mouth daily.   finasteride (PROSCAR) 5 MG tablet Take 1 tablet (5 mg total) by mouth daily.   fluticasone (FLONASE) 50 MCG/ACT nasal spray Place 2 sprays into both nostrils as needed.   fluticasone furoate-vilanterol (BREO ELLIPTA) 200-25 MCG/ACT AEPB Inhale 1 puff into the lungs daily.   latanoprost (XALATAN) 0.005 % ophthalmic solution ADMINISTER 1 DROP TO THE RIGHT EYE NIGHTLY.   omeprazole (PRILOSEC) 40 MG capsule TAKE 1 CAPSULE (40 MG TOTAL) BY MOUTH DAILY.   rosuvastatin (CRESTOR) 10 MG tablet Take 1 tablet (10 mg total) by mouth daily.   sucralfate (CARAFATE) 1 g tablet TAKE 1  TABLET (1 G TOTAL) BY MOUTH 4 TIMES A DAY WITH MEALS AND AT BEDTIME   tamsulosin (FLOMAX) 0.4 MG CAPS capsule TAKE 1 CAPSULE BY MOUTH EVERY DAY   timolol (TIMOPTIC) 0.5 % ophthalmic solution Place 1 drop into the right eye 2 (two) times daily.   vitamin B-12 (CYANOCOBALAMIN) 1000 MCG tablet Take by mouth.   [DISCONTINUED] olmesartan (BENICAR) 20 MG tablet Take 0.5 tablets (10 mg total) by mouth daily.     Allergies:   Brimonidine, Brimonidine tartrate, Brinzolamide-brimonidine, and Other   Social History   Socioeconomic History   Marital status: Married    Spouse name: Not  on file   Number of children: Not on file   Years of education: Not on file   Highest education level: Not on file  Occupational History   Not on file  Tobacco Use   Smoking status: Never   Smokeless tobacco: Never  Vaping Use   Vaping Use: Never used  Substance and Sexual Activity   Alcohol use: No    Alcohol/week: 0.0 standard drinks of alcohol   Drug use: No   Sexual activity: Yes  Other Topics Concern   Not on file  Social History Narrative   Not on file   Social Determinants of Health   Financial Resource Strain: Low Risk  (10/27/2022)   Overall Financial Resource Strain (CARDIA)    Difficulty of Paying Living Expenses: Not hard at all  Food Insecurity: No Food Insecurity (10/27/2022)   Hunger Vital Sign    Worried About Running Out of Food in the Last Year: Never true    Ran Out of Food in the Last Year: Never true  Transportation Needs: No Transportation Needs (10/27/2022)   PRAPARE - Hydrologist (Medical): No    Lack of Transportation (Non-Medical): No  Physical Activity: Sufficiently Active (10/27/2022)   Exercise Vital Sign    Days of Exercise per Week: 7 days    Minutes of Exercise per Session: 60 min  Stress: No Stress Concern Present (10/27/2022)   [place]    Feeling of Stress : Not at all  Social Connections: Unknown (10/27/2022)   Social Connection and Isolation Panel [NHANES]    Frequency of Communication with Friends and Family: Not on file    Frequency of Social Gatherings with Friends and Family: Not on file    Attends Religious Services: Not on file    Active Member of Clubs or Organizations: Not on file    Attends Archivist Meetings: Not on file    Marital Status: Married     Family History: The patient's family history includes Alcohol abuse in his father; Heart disease in his mother; Hyperlipidemia in his father; Stroke in his  father.  ROS:   Please see the history of present illness.     All other systems reviewed and are negative.  EKGs/Labs/Other Studies Reviewed:    The following studies were reviewed today:   EKG:  EKG is  ordered today.  The ekg ordered today demonstrates sinus bradycardia, heart rate 53  Recent Labs: 06/16/2022: ALT 17; TSH 1.21 07/10/2022: BUN 14; Creatinine, Ser 1.33; Potassium 4.2; Sodium 138 08/25/2022: Hemoglobin 13.0; Platelets 152  Recent Lipid Panel    Component Value Date/Time   CHOL 151 05/15/2022 1451   TRIG 36 05/15/2022 1451   HDL 73 05/15/2022 1451   CHOLHDL 2.1 05/15/2022 1451   VLDL 7 05/15/2022 1451  LDLCALC 71 05/15/2022 1451     Risk Assessment/Calculations:          Physical Exam:    VS:  BP (!) 140/82 (BP Location: Left Arm, Patient Position: Sitting, Cuff Size: Normal)   Pulse (!) 53   Ht 5\' 10"  (1.778 m)   Wt 151 lb 2 oz (68.5 kg)   BMI 21.68 kg/m     Wt Readings from Last 3 Encounters:  12/29/22 151 lb 2 oz (68.5 kg)  10/27/22 152 lb (68.9 kg)  09/13/22 152 lb 6.4 oz (69.1 kg)     GEN:  Well nourished, well developed in no acute distress HEENT: Normal NECK: No JVD; No carotid bruits CARDIAC: RRR, no murmurs, rubs, gallops RESPIRATORY:  Clear to auscultation without rales, wheezing or rhonchi  ABDOMEN: Soft, non-tender, non-distended MUSCULOSKELETAL:  No edema; No deformity  SKIN: Warm and dry NEUROLOGIC:  Alert and oriented x 3 PSYCHIATRIC:  Normal affect   ASSESSMENT:    1. Coronary artery disease involving native heart without angina pectoris, unspecified vessel or lesion type   2. Pure hypercholesterolemia   3. Primary hypertension    PLAN:    In order of problems listed above:  Mild nonobstructive CAD (proximal LAD, D1, RCA ) coronary CT 8/23.  EF 55 to 60%.  Continue aspirin 81 mg daily, Crestor 10 mg daily. Hyperlipidemia, continue Crestor 10 mg. Hypertension, BP elevated, increase Benicar to 20 mg daily, continue  Norvasc 5 mg daily.  Follow-up in 3 months for BP check.     Medication Adjustments/Labs and Tests Ordered: Current medicines are reviewed at length with the patient today.  Concerns regarding medicines are outlined above.  Orders Placed This Encounter  Procedures   EKG 12-Lead   Meds ordered this encounter  Medications   olmesartan (BENICAR) 20 MG tablet    Sig: Take 1 tablet (20 mg total) by mouth daily.    Dispense:  90 tablet    Refill:  0    Patient Instructions  Medication Instructions:   INCREASE Benicar - Take one tablet ( 20 mg ) by mouth daily.   *If you need a refill on your cardiac medications before your next appointment, please call your pharmacy*   Lab Work:  None Ordered  If you have labs (blood work) drawn today and your tests are completely normal, you will receive your results only by: [place] (if you have MyChart) OR A paper copy in the mail If you have any lab test that is abnormal or we need to change your treatment, we will call you to review the results.   Testing/Procedures:  None Ordered    Follow-Up: At [facility], you and your health needs are our priority.  As part of our continuing mission to provide you with exceptional heart care, we have created designated Provider Care Teams.  These Care Teams include your primary Cardiologist (physician) and Advanced Practice Providers (APPs -  Physician Assistants and Nurse Practitioners) who all work together to provide you with the care you need, when you need it.  We recommend signing up for the patient portal called "MyChart".  Sign up information is provided on this After Visit Summary.  MyChart is used to connect with patients for Virtual Visits (Telemedicine).  Patients are able to view lab/test results, encounter notes, upcoming appointments, etc.  Non-urgent messages can be sent to your provider as well.   To learn more about what you can do with MyChart, go to  [email or web address].    Your next appointment:   3 month(s)  Provider:   You may see Jose Sable, MD or one of the following Advanced Practice Providers on your designated Care Team:   Murray Hodgkins, NP Christell Faith, PA-C Cadence Kathlen Mody, PA-C Gerrie Nordmann, NP   Signed, Jose Sable, MD  12/29/2022 2:55 PM    [place]

## 2022-12-29 NOTE — Patient Instructions (Signed)
Medication Instructions:   INCREASE Benicar - Take one tablet ( 20 mg ) by mouth daily.   *If you need a refill on your cardiac medications before your next appointment, please call your pharmacy*   Lab Work:  None Ordered  If you have labs (blood work) drawn today and your tests are completely normal, you will receive your results only by: [place] (if you have MyChart) OR A paper copy in the mail If you have any lab test that is abnormal or we need to change your treatment, we will call you to review the results.   Testing/Procedures:  None Ordered    Follow-Up: At [facility], you and your health needs are our priority.  As part of our continuing mission to provide you with exceptional heart care, we have created designated Provider Care Teams.  These Care Teams include your primary Cardiologist (physician) and Advanced Practice Providers (APPs -  Physician Assistants and Nurse Practitioners) who all work together to provide you with the care you need, when you need it.  We recommend signing up for the patient portal called "MyChart".  Sign up information is provided on this After Visit Summary.  MyChart is used to connect with patients for Virtual Visits (Telemedicine).  Patients are able to view lab/test results, encounter notes, upcoming appointments, etc.  Non-urgent messages can be sent to your provider as well.   To learn more about what you can do with MyChart, go to [email or web address].    Your next appointment:   3 month(s)  Provider:   You may see Kate Sable, MD or one of the following Advanced Practice Providers on your designated Care Team:   Murray Hodgkins, NP Christell Faith, PA-C Cadence Kathlen Mody, PA-C Gerrie Nordmann, NP

## 2023-01-03 ENCOUNTER — Telehealth: Payer: Self-pay | Admitting: Cardiology

## 2023-01-03 NOTE — Telephone Encounter (Signed)
Pt c/o medication issue:  1. Name of Medication: olmesartan (BENICAR) 20 MG tablet   2. How are you currently taking this medication (dosage and times per day)? Take 1 tablet (20 mg total) by mouth daily.   3. Are you having a reaction (difficulty breathing--STAT)? No  4. What is your medication issue? Pt would like a callback regarding above medication because he feels its not working. Please advise.

## 2023-01-03 NOTE — Telephone Encounter (Signed)
Called and spoke with patient and he reports blood pressures during the day are good but when he first gets up in the morning they are elevated. Inquire if those readings were before or after medications. He reports that it is before he takes the medications. Instructed him to continue monitoring blood pressures but make sure it is 2 hours after medications and keep a log of those readings. Advised that if they continue to remain elevated to please give [name] a call back. He verbalized understanding with no further questions at this time.

## 2023-01-05 NOTE — Telephone Encounter (Signed)
Called and spoke with patient. He reports blood pressures remain elevated and mostly before medications. Explained that we need readings 2 hours after medications. He reports it was 150/90 and then went down to 141/80. Checked blood pressure while on the phone and it was 142/80. He states it is all over the place and it will run high and then it will go back down. Reiterated the need to check blood pressures 2 hours after medications. Advised that since he has some concerns I would send to provider for review and if he should have any recommendations and I will be in touch with him.

## 2023-01-05 NOTE — Telephone Encounter (Signed)
Pt c/o BP issue: STAT if pt c/o blurred vision, one-sided weakness or slurred speech  1. What are your last 5 BP readings?   152-153/90 all week  2. Are you having any other symptoms (ex. Dizziness, headache, blurred vision, passed out)?   No  3. What is your BP issue?   Patient stated he has been checking his BP two hours after taking his medication as directed and his BP has gone down except today when his BP stayed the same.

## 2023-01-08 MED ORDER — AMLODIPINE BESYLATE 10 MG PO TABS: 10.0000 mg | ORAL_TABLET | Freq: Every day | ORAL | 3 refills | Status: DC

## 2023-01-08 NOTE — Telephone Encounter (Signed)
Spoke with patient and reviewed provider recommendations. He verbalized understanding with no further questions at this time.   Kate Sable, MD  SentNancy Fetter January 07, 2023  2:02 PM  To: Valora Corporal, RN; [place], [place], [place]         Message  Increase Norvasc to 10 mg daily.

## 2023-01-08 NOTE — Addendum Note (Signed)
Addended by: Valora Corporal on: 01/08/2023 01:57 PM   Modules accepted: Orders

## 2023-01-19 ENCOUNTER — Other Ambulatory Visit: Payer: Self-pay | Admitting: Internal Medicine

## 2023-02-13 ENCOUNTER — Encounter: Payer: Self-pay | Admitting: Nurse Practitioner

## 2023-02-13 ENCOUNTER — Ambulatory Visit (INDEPENDENT_AMBULATORY_CARE_PROVIDER_SITE_OTHER): Payer: Medicare Other | Admitting: Nurse Practitioner

## 2023-02-13 VITALS — BP 130/70 | HR 60 | Temp 97.9°F | Ht 70.0 in | Wt 149.6 lb

## 2023-02-13 DIAGNOSIS — H60502 Unspecified acute noninfective otitis externa, left ear: Secondary | ICD-10-CM

## 2023-02-13 DIAGNOSIS — J302 Other seasonal allergic rhinitis: Secondary | ICD-10-CM

## 2023-02-13 DIAGNOSIS — J309 Allergic rhinitis, unspecified: Secondary | ICD-10-CM | POA: Insufficient documentation

## 2023-02-13 DIAGNOSIS — J4550 Severe persistent asthma, uncomplicated: Secondary | ICD-10-CM

## 2023-02-13 DIAGNOSIS — H6090 Unspecified otitis externa, unspecified ear: Secondary | ICD-10-CM | POA: Insufficient documentation

## 2023-02-13 MED ORDER — CETIRIZINE HCL 5 MG PO TABS: 5.0000 mg | ORAL_TABLET | Freq: Every day | ORAL | 5 refills | Status: DC

## 2023-02-13 MED ORDER — NEOMYCIN-POLYMYXIN-HC 3.5-10000-1 OT SOLN: 4.0000 [drp] | Freq: Four times a day (QID) | OTIC | 0 refills | Status: AC

## 2023-02-13 MED ORDER — MONTELUKAST SODIUM 10 MG PO TABS: 10.0000 mg | ORAL_TABLET | Freq: Every day | ORAL | 5 refills | Status: DC

## 2023-02-13 NOTE — Progress Notes (Signed)
Agree with the details of the visit as noted by Katherine Cobb, NP. ° °C. Laura Ignacia Gentzler, MD °Advanced Bronchoscopy °PCCM [facility] ° °

## 2023-02-13 NOTE — Progress Notes (Signed)
@Patient  ID: Jose Perry, male    DOB: 1940/11/01, 82 y.o.   MRN: [id number]  Chief Complaint  Patient presents with   Follow-up    SOB with exertion, non prod cough and occ wheezing.     Referring provider: Dale [place], MD  HPI: 82 year old male, never smoker followed for severe asthma.  He is patient Dr. Jayme Cloud and last seen in office 09/13/2022.  Past medical history significant for environmental allergies, hypertension, liver hemangioma, GERD, thyroid goiter, BPH, open-angle glaucoma, ED.  TEST/EVENTS:  09/12/2017 CT chest without contrast: Enlarged left thyroid gland.  Lungs are clear.  CAD. 05/2022 cardiac CT: Included lung fields are clear.  Small pericardial effusion.  Benign liver hemangioma 07/2022 FeNo 57 ppb 08/2022 positive RAST to grasses, dust, dust mites, trees, cat dander, mold; IgE 325; eos 300 08/2022: A1AT 124, MM 09/12/2022 PFT: FVC 47, FEV1 44, ratio 65, TLC 74, DLCOcor 71  09/13/2022: OV with Dr. Jayme Cloud.  Lifelong never smoker.  Poor understanding of disease process.  During last visit, he was instructed on proper use of his asthma controller as he was switched to Trelegy.  He has significant type II inflammation with nitric oxide at 57 on past testing.  Felt that the Trelegy was not working well but no different than the [employer].  Had allergen panel performed which showed sensitivities to multiple allergens, in particular grasses, mold and some trees, as well as dust mites.  Using albuterol 1-2 times a week.  A1 AT with MM phenotype and level at 124.  IgE was 325.  Wants to get back on Breo -would recommend keeping him on high-dose given significant airway inflammation.  Changed to Breo 200 mcg.  Referred to allergy/immunology.  02/13/2023: Today-follow-up Patient resents today for follow-up.  Breathing has been doing okay since he was last seen.  Has had a little more trouble with increased albuterol use during allergy season.  He was wheezing a few weeks ago and  was having to use his rescue inhaler daily.  Wheezing has mostly resolved.  He does still have a little bit of a dry cough, which was slightly increased yesterday after he had been outside all weekend but seems to be better today.  Feels like his breathing is relatively the same.  Not necessarily feeling significantly more short of breath or having more chest tightness.  He is having some trouble with his left ear.  Feels a little sore and full.  He is not sure if he needs to go back to see [facility].  Never saw allergist.  Denies any fevers, chills, orthopnea, PND, lower extremity swelling, hemoptysis.  Using his Breo 200 daily.  Not on any allergy medicine.  Does have some nasal congestion, postnasal drip and occasional sneezing especially if he has been outside working in his yard.  Allergies  Allergen Reactions   Brimonidine     Other reaction(s): Eye Redness   Brimonidine Tartrate     Other reaction(s): Eye redness   Brinzolamide-Brimonidine     Other reaction(s): Eye redness   Other Other (See Comments)    Simbrinza Eye Drops gives pt Eye redness    Immunization History  Administered Date(s) Administered   Fluad Quad(high Dose 65+) 08/27/2020, 07/11/2021, 06/16/2022   Influenza, High Dose Seasonal PF 06/13/2016, 08/20/2017, 07/31/2018, 08/22/2019   Influenza,inj,Quad PF,6+ Mos 07/20/2015   PFIZER Comirnaty(Gray Top)Covid-19 Tri-Sucrose Vaccine 02/26/2021, 06/28/2022   PFIZER(Purple Top)SARS-COV-2 Vaccination 10/30/2019, 11/20/2019, 07/05/2020   Pfizer Covid-19 Vaccine Bivalent Booster 40yrs &  up 07/01/2021   Pneumococcal Conjugate-13 06/10/2015   Pneumococcal Polysaccharide-23 06/30/2013   Respiratory Syncytial Virus Vaccine,Recomb Aduvanted(Arexvy) 10/07/2022   Tdap 10/25/2021   Zoster Recombinat (Shingrix) 07/06/2018, 08/15/2021   Zoster, Live 06/30/2013    Past Medical History:  Diagnosis Date   Asthma    Dyspnea    Elevated blood pressure    Enlarged prostate    GERD  (gastroesophageal reflux disease)    Glaucoma    History of adenomatous polyp of colon    Hx of colonic polyps    Hypertension    Leukopenia    has had an extensive w/up.     Liver hemangioma 01/24/2018   Thyroid goiter     Tobacco History: Social History   Tobacco Use  Smoking Status Never  Smokeless Tobacco Never   Counseling given: Not Answered   Outpatient Medications Prior to Visit  Medication Sig Dispense Refill   albuterol (VENTOLIN HFA) 108 (90 Base) MCG/ACT inhaler INHALE 2 PUFFS BY MOUTH EVERY 6 HOURS AS NEEDED 18 each 2   amLODipine (NORVASC) 10 MG tablet Take 1 tablet (10 mg total) by mouth daily. 90 tablet 3   ASPIRIN 81 PO Take by mouth.     ferrous sulfate 325 (65 FE) MG EC tablet Take 325 mg by mouth daily.     finasteride (PROSCAR) 5 MG tablet Take 1 tablet (5 mg total) by mouth daily. 90 tablet 3   fluticasone (FLONASE) 50 MCG/ACT nasal spray Place 2 sprays into both nostrils as needed.     fluticasone furoate-vilanterol (BREO ELLIPTA) 200-25 MCG/ACT AEPB Inhale 1 puff into the lungs daily. 28 each 11   latanoprost (XALATAN) 0.005 % ophthalmic solution ADMINISTER 1 DROP TO THE RIGHT EYE NIGHTLY.     olmesartan (BENICAR) 20 MG tablet TAKE 1/2 TABLET BY MOUTH DAILY 45 tablet 2   omeprazole (PRILOSEC) 40 MG capsule TAKE 1 CAPSULE (40 MG TOTAL) BY MOUTH DAILY. 90 capsule 1   rosuvastatin (CRESTOR) 10 MG tablet Take 1 tablet (10 mg total) by mouth daily. 90 tablet 1   sucralfate (CARAFATE) 1 g tablet TAKE 1 TABLET (1 G TOTAL) BY MOUTH 4 TIMES A DAY WITH MEALS AND AT BEDTIME 120 tablet 0   tamsulosin (FLOMAX) 0.4 MG CAPS capsule TAKE 1 CAPSULE BY MOUTH EVERY DAY 90 capsule 3   timolol (TIMOPTIC) 0.5 % ophthalmic solution Place 1 drop into the right eye 2 (two) times daily.     vitamin B-12 (CYANOCOBALAMIN) 1000 MCG tablet Take by mouth.     No facility-administered medications prior to visit.     Review of Systems:   Constitutional: No weight loss or gain,  night sweats, fevers, chills, fatigue, or lassitude. HEENT: No headaches, difficulty swallowing, tooth/dental problems, or sore throat. No itching. + ear ache, sneezing, nasal congestion, post nasal drip CV:  No chest pain, orthopnea, PND, swelling in lower extremities, anasarca, dizziness, palpitations, syncope Resp: +shortness of breath with exertion (baseline); wheezing (resolved); dry cough (improved). No excess mucus or change in color of mucus. No hemoptysis. No chest wall deformity GI:  No heartburn, indigestion, abdominal pain, nausea, vomiting, diarrhea, change in bowel habits, loss of appetite, bloody stools.  GU: No dysuria, change in color of urine, urgency or frequency.  No flank pain, no hematuria  Skin: No rash, lesions, ulcerations MSK:  No joint pain or swelling.   Neuro: No dizziness or lightheadedness.  Psych: No depression or anxiety. Mood stable.     Physical Exam:  BP  130/70 (BP Location: Left Arm, Cuff Size: Normal)   Pulse 60   Temp 97.9 F (36.6 C) (Temporal)   Ht 5\' 10"  (1.778 m)   Wt 149 lb 9.6 oz (67.9 kg)   SpO2 97%   BMI 21.47 kg/m   GEN: Pleasant, interactive, well-appearing; in no acute distress HEENT:  Normocephalic and atraumatic. EACs patent bilaterally. Left EAC erythematous/edematous. TM pearly gray with present light reflex bilaterally. PERRLA. Sclera white. Nasal turbinates boggy, moist and patent bilaterally. No rhinorrhea present. Oropharynx pink and moist, without exudate or edema. No lesions, ulcerations, or postnasal drip.  NECK:  Supple w/ fair ROM. No JVD present. Normal carotid impulses w/o bruits. Thyroid symmetrical with no goiter or nodules palpated. No lymphadenopathy.   CV: RRR, no m/r/g, no peripheral edema. Pulses intact, +2 bilaterally. No cyanosis, pallor or clubbing. PULMONARY:  Unlabored, regular breathing. Clear bilaterally A&P w/o wheezes/rales/rhonchi. No accessory muscle use.  GI: BS present and normoactive. Soft, non-tender  to palpation. No organomegaly or masses detected. MSK: No erythema, warmth or tenderness. Cap refil <2 sec all extrem. No deformities or joint swelling noted.  Neuro: A/Ox3. No focal deficits noted.   Skin: Warm, no lesions or rashe Psych: Normal affect and behavior. Judgement and thought content appropriate.     Lab Results:  CBC    Component Value Date/Time   WBC 3.1 (L) 08/25/2022 1428   WBC 3.0 (L) 06/16/2022 0845   RBC 4.70 08/25/2022 1428   RBC 4.68 06/16/2022 0845   HGB 13.0 08/25/2022 1428   HCT 39.8 08/25/2022 1428   PLT 152 08/25/2022 1428   MCV 85 08/25/2022 1428   MCV 84 04/16/2014 1927   MCH 27.7 08/25/2022 1428   MCH 27.0 05/25/2022 1735   MCHC 32.7 08/25/2022 1428   MCHC 32.8 06/16/2022 0845   RDW 13.5 08/25/2022 1428   RDW 15.1 (H) 04/16/2014 1927   LYMPHSABS 0.8 08/25/2022 1428   LYMPHSABS 0.6 (L) 03/22/2014 1827   MONOABS 0.2 06/16/2022 0845   MONOABS 0.6 03/22/2014 1827   EOSABS 0.3 08/25/2022 1428   EOSABS 0.1 03/22/2014 1827   BASOSABS 0.0 08/25/2022 1428   BASOSABS 0.0 03/22/2014 1827    BMET    Component Value Date/Time   NA 138 07/10/2022 0933   NA 130 (L) 04/16/2014 1927   K 4.2 07/10/2022 0933   K 3.8 04/16/2014 1927   CL 103 07/10/2022 0933   CL 95 (L) 04/16/2014 1927   CO2 28 07/10/2022 0933   CO2 26 04/16/2014 1927   GLUCOSE 80 07/10/2022 0933   GLUCOSE 117 (H) 04/16/2014 1927   BUN 14 07/10/2022 0933   BUN 7 (L) 08/22/2018 1440   BUN 16 04/16/2014 1927   CREATININE 1.33 07/10/2022 0933   CREATININE 1.60 (H) 04/16/2014 1927   CALCIUM 9.6 07/10/2022 0933   CALCIUM 9.8 04/16/2014 1927   GFRNONAA 59 (L) 05/25/2022 1758   GFRNONAA 42 (L) 04/16/2014 1927   GFRAA 68 08/22/2018 1440   GFRAA 49 (L) 04/16/2014 1927    BNP    Component Value Date/Time   BNP 60.0 08/21/2017 0136     Imaging:  No results found.       Latest Ref Rng & Units 09/12/2022    2:54 PM  PFT Results  FVC-Pre L 1.93   FVC-Predicted Pre % 47    FVC-Post L 1.95   FVC-Predicted Post % 48   Pre FEV1/FVC % % 66   Post FEV1/FCV % % 65  FEV1-Pre L 1.28   FEV1-Predicted Pre % 44   FEV1-Post L 1.28   DLCO uncorrected ml/min/mmHg 20.15   DLCO UNC% % 82   DLVA Predicted % 115   TLC L 5.27   TLC % Predicted % 74   RV % Predicted % 119     Lab Results  Component Value Date   NITRICOXIDE 27 09/13/2022        Assessment & Plan:   Asthma Severe asthma with allergic phenotype.  He appears better controlled but still having some trouble during the allergy season.  Does not appear to to be in acute exacerbation today with clear lung exam and improvement in symptoms.  Plan to add on Singulair and daily antihistamine for trigger prevention.  If no improvement, may need to consider step up in therapy to higher dose ICS/LABA or again try triple therapy regimen.  He would also be a potential candidate for biologic therapy.  Will reevaluate at his follow-up to determine next steps.  Asthma action plan in place.  Patient Instructions  Continue Breo 1 puff daily. Brush tongue and rinse mouth afterwards Continue Albuterol inhaler 2 puffs or 3 mL neb every 6 hours as needed for shortness of breath or wheezing. Notify if symptoms persist despite rescue inhaler/neb use.   Neomycin-polymyxin-hydrocortisone ear drops - apply 4 drops to left ear four times a day for 7 days.  Start cetirizine 1 tab daily for allergies Start montelukast 1 tab At bedtime for allergies/asthma   Follow up with ENT if your ear does not start to feel better   Referral to allergist placed today   Follow up in 6 weeks with Dr. Alveta Heimlich or Philis Nettle. If symptoms do not improve or worsen, please contact office for sooner follow up or seek emergency care.     Allergic rhinitis Significant environmental allergies with previous positive RAST and elevated IgE.  He is currently only using an intranasal nasal spray.  I am going to start him on Singulair and daily  antihistamine for better control of this.  Referral again placed to allergist.  If minimal improvement, may need to consider allergy shots/immunotherapy.   Otitis externa Evidence of otitis externa with intact tympanic membrane on the left side.  Hearing intact.  Will start him on antibiotic/steroid drops.  Advised that if he does not have any improvement to seek further evaluation with ENT.   I spent 35 minutes of dedicated to the care of this patient on the date of this encounter to include pre-visit review of records, face-to-face time with the patient discussing conditions above, post visit ordering of testing, clinical documentation with the electronic health record, making appropriate referrals as documented, and communicating necessary findings to members of the patients care team.  Noemi Chapel, NP 02/13/2023  Pt aware and understands NP's role.

## 2023-02-13 NOTE — Assessment & Plan Note (Signed)
Severe asthma with allergic phenotype.  He appears better controlled but still having some trouble during the allergy season.  Does not appear to to be in acute exacerbation today with clear lung exam and improvement in symptoms.  Plan to add on Singulair and daily antihistamine for trigger prevention.  If no improvement, may need to consider step up in therapy to higher dose ICS/LABA or again try triple therapy regimen.  He would also be a potential candidate for biologic therapy.  Will reevaluate at his follow-up to determine next steps.  Asthma action plan in place.  Patient Instructions  Continue Breo 1 puff daily. Brush tongue and rinse mouth afterwards Continue Albuterol inhaler 2 puffs or 3 mL neb every 6 hours as needed for shortness of breath or wheezing. Notify if symptoms persist despite rescue inhaler/neb use.   Neomycin-polymyxin-hydrocortisone ear drops - apply 4 drops to left ear four times a day for 7 days.  Start cetirizine 1 tab daily for allergies Start montelukast 1 tab At bedtime for allergies/asthma   Follow up with ENT if your ear does not start to feel better   Referral to allergist placed today   Follow up in 6 weeks with Dr. Alveta Heimlich or Philis Nettle. If symptoms do not improve or worsen, please contact office for sooner follow up or seek emergency care.

## 2023-02-13 NOTE — Assessment & Plan Note (Addendum)
Evidence of otitis externa with intact tympanic membrane on the left side.  Hearing intact.  Will start him on antibiotic/steroid drops.  Advised that if he does not have any improvement to seek further evaluation with ENT.

## 2023-02-13 NOTE — Assessment & Plan Note (Addendum)
Significant environmental allergies with previous positive RAST and elevated IgE.  He is currently only using an intranasal nasal spray.  I am going to start him on Singulair and daily antihistamine for better control of this.  Referral again placed to allergist.  If minimal improvement, may need to consider allergy shots/immunotherapy.

## 2023-02-13 NOTE — Patient Instructions (Addendum)
Continue Breo 1 puff daily. Brush tongue and rinse mouth afterwards Continue Albuterol inhaler 2 puffs or 3 mL neb every 6 hours as needed for shortness of breath or wheezing. Notify if symptoms persist despite rescue inhaler/neb use.   Neomycin-polymyxin-hydrocortisone ear drops - apply 4 drops to left ear four times a day for 7 days.  Start cetirizine 1 tab daily for allergies Start montelukast 1 tab At bedtime for allergies/asthma   Follow up with ENT if your ear does not start to feel better   Referral to allergist placed today   Follow up in 6 weeks with Dr. Alveta Heimlich or Philis Nettle. If symptoms do not improve or worsen, please contact office for sooner follow up or seek emergency care.

## 2023-02-20 DIAGNOSIS — E611 Iron deficiency: Secondary | ICD-10-CM | POA: Diagnosis not present

## 2023-02-20 DIAGNOSIS — K921 Melena: Secondary | ICD-10-CM | POA: Diagnosis not present

## 2023-02-24 ENCOUNTER — Telehealth: Payer: Self-pay | Admitting: Internal Medicine

## 2023-02-26 DIAGNOSIS — J301 Allergic rhinitis due to pollen: Secondary | ICD-10-CM | POA: Diagnosis not present

## 2023-02-26 DIAGNOSIS — H6123 Impacted cerumen, bilateral: Secondary | ICD-10-CM | POA: Diagnosis not present

## 2023-02-26 DIAGNOSIS — J069 Acute upper respiratory infection, unspecified: Secondary | ICD-10-CM | POA: Diagnosis not present

## 2023-03-01 ENCOUNTER — Other Ambulatory Visit: Payer: Self-pay | Admitting: Urology

## 2023-03-01 ENCOUNTER — Telehealth: Payer: Self-pay | Admitting: Cardiology

## 2023-03-01 NOTE — Telephone Encounter (Signed)
Pt need a refill on amlodipine sent to [facility]

## 2023-03-01 NOTE — Telephone Encounter (Signed)
Notified pt that the 5mg  Amlodipine was D/C. He should only need the 10mg  & needs to contact the pharmacy to notify them.

## 2023-03-01 NOTE — Telephone Encounter (Signed)
amLODipine (NORVASC) 10 MG tablet 90 tablet 3 01/08/2023 04/08/2023   Sig - Route: Take 1 tablet (10 mg total) by mouth daily. - Oral   Sent to pharmacy as: amLODipine (NORVASC) 10 MG tablet   E-Prescribing Status: Receipt confirmed by pharmacy (01/08/2023  1:56 PM EDT)    Pharmacy  [id number] [address], [place] - [phone]   Spoke with patient and advised him to call the pharmacy for refills. Patient states they were trying to refill an old Rx of amlodipine sent in by his PCP.

## 2023-03-01 NOTE — Telephone Encounter (Signed)
*  STAT* If patient is at the pharmacy, call can be transferred to refill team.   1. Which medications need to be refilled? (please list name of each medication and dose if known)   amLODipine (NORVASC) 10 MG tablet   2. Which pharmacy/location (including street and city if local pharmacy) is medication to be sent to?  [id number] [address], [place] [phone]   3. Do they need a 30 day or 90 day supply?   90 day  Patient stated he still has some medication left.

## 2023-03-01 NOTE — Telephone Encounter (Signed)
Disp Refills Start End   amLODipine (NORVASC) 10 MG tablet 90 tablet 3 01/08/2023 04/08/2023   Sig - Route: Take 1 tablet (10 mg total) by mouth daily. - Oral   Sent to pharmacy as: amLODipine (NORVASC) 10 MG tablet   E-Prescribing Status: Receipt confirmed by pharmacy (01/08/2023  1:56 PM EDT)    Pharmacy  [id number] [address], [place] - [phone]   Called patient to inform him that an one year RX was sent to the pharmacy on  01/08/2023.

## 2023-03-07 DIAGNOSIS — H40003 Preglaucoma, unspecified, bilateral: Secondary | ICD-10-CM | POA: Diagnosis not present

## 2023-03-15 DIAGNOSIS — H401112 Primary open-angle glaucoma, right eye, moderate stage: Secondary | ICD-10-CM | POA: Diagnosis not present

## 2023-03-15 DIAGNOSIS — Z961 Presence of intraocular lens: Secondary | ICD-10-CM | POA: Diagnosis not present

## 2023-03-15 DIAGNOSIS — H2512 Age-related nuclear cataract, left eye: Secondary | ICD-10-CM | POA: Diagnosis not present

## 2023-03-15 DIAGNOSIS — H401123 Primary open-angle glaucoma, left eye, severe stage: Secondary | ICD-10-CM | POA: Diagnosis not present

## 2023-03-27 ENCOUNTER — Encounter: Payer: Self-pay | Admitting: Nurse Practitioner

## 2023-03-27 ENCOUNTER — Ambulatory Visit (INDEPENDENT_AMBULATORY_CARE_PROVIDER_SITE_OTHER): Payer: Medicare Other | Admitting: Nurse Practitioner

## 2023-03-27 VITALS — BP 120/64 | HR 52 | Temp 97.6°F | Ht 70.0 in | Wt 147.6 lb

## 2023-03-27 DIAGNOSIS — J3089 Other allergic rhinitis: Secondary | ICD-10-CM | POA: Diagnosis not present

## 2023-03-27 DIAGNOSIS — J4540 Moderate persistent asthma, uncomplicated: Secondary | ICD-10-CM | POA: Diagnosis not present

## 2023-03-27 MED ORDER — ALBUTEROL SULFATE HFA 108 (90 BASE) MCG/ACT IN AERS: 2.0000 | INHALATION_SPRAY | Freq: Four times a day (QID) | RESPIRATORY_TRACT | 2 refills | Status: DC | PRN

## 2023-03-27 NOTE — Progress Notes (Signed)
@Patient  ID: Jose Perry, male    DOB: [birth year], 82 y.o.   MRN: [id number]  Chief Complaint  Patient presents with   Follow-up    Breathing is doing well. Occ prod cough with clear sputum.     Referring provider: Dale Foot of Ten, MD  HPI: 82 year old male, never smoker followed for severe asthma.  He is patient Dr. Jayme Cloud and last seen in office 09/13/2022.  Past medical history significant for environmental allergies, hypertension, liver hemangioma, GERD, thyroid goiter, BPH, open-angle glaucoma, ED.  TEST/EVENTS:  09/12/2017 CT chest without contrast: Enlarged left thyroid gland.  Lungs are clear.  CAD. 05/2022 cardiac CT: Included lung fields are clear.  Small pericardial effusion.  Benign liver hemangioma 07/2022 FeNo 57 ppb 08/2022 positive RAST to grasses, dust, dust mites, trees, cat dander, mold; IgE 325; eos 300 08/2022: A1AT 124, MM 09/12/2022 PFT: FVC 47, FEV1 44, ratio 65, TLC 74, DLCOcor 71  09/13/2022: OV with Dr. Jayme Cloud.  Lifelong never smoker.  Poor understanding of disease process.  During last visit, he was instructed on proper use of his asthma controller as he was switched to Trelegy.  He has significant type II inflammation with nitric oxide at 57 on past testing.  Felt that the Trelegy was not working well but no different than the [employer].  Had allergen panel performed which showed sensitivities to multiple allergens, in particular grasses, mold and some trees, as well as dust mites.  Using albuterol 1-2 times a week.  A1 AT with MM phenotype and level at 124.  IgE was 325.  Wants to get back on Breo -would recommend keeping him on high-dose given significant airway inflammation.  Changed to Breo 200 mcg.  Referred to allergy/immunology.  02/13/2023: OV with Hali Balgobin NP for follow-up.  Breathing has been doing okay since he was last seen.  Has had a little more trouble with increased albuterol use during allergy season.  He was wheezing a few weeks ago and was having to  use his rescue inhaler daily.  Wheezing has mostly resolved.  He does still have a little bit of a dry cough, which was slightly increased yesterday after he had been outside all weekend but seems to be better today.  Feels like his breathing is relatively the same.  Not necessarily feeling significantly more short of breath or having more chest tightness.  He is having some trouble with his left ear.  Feels a little sore and full.  He is not sure if he needs to go back to see [facility].  Never saw allergist.  Denies any fevers, chills, orthopnea, PND, lower extremity swelling, hemoptysis.  Using his Breo 200 daily.  Not on any allergy medicine.  Does have some nasal congestion, postnasal drip and occasional sneezing especially if he has been outside working in his yard.  03/27/2023: Today - follow up Patient presents today for follow up. He was treated for otitis externa and worsening allergic rhinitis. He is feeling better today. No more issues with his ears. His sinuses feel back to normal. He stopped the allergy medicines because of it. He denies any shortness of breath, wheezing, cough. Using his Breo. Not needing albuterol.   Allergies  Allergen Reactions   Brimonidine     Other reaction(s): Eye Redness   Brimonidine Tartrate     Other reaction(s): Eye redness   Brinzolamide-Brimonidine     Other reaction(s): Eye redness   Other Other (See Comments)    Simbrinza Eye Drops gives  pt Eye redness    Immunization History  Administered Date(s) Administered   Fluad Quad(high Dose 65+) 08/27/2020, 07/11/2021, 06/16/2022   Influenza, High Dose Seasonal PF 06/13/2016, 08/20/2017, 07/31/2018, 08/22/2019   Influenza,inj,Quad PF,6+ Mos 07/20/2015   PFIZER Comirnaty(Gray Top)Covid-19 Tri-Sucrose Vaccine 02/26/2021, 06/28/2022   PFIZER(Purple Top)SARS-COV-2 Vaccination 10/30/2019, 11/20/2019, 07/05/2020   Pfizer Covid-19 Vaccine Bivalent Booster 2yrs & up 07/01/2021   Pneumococcal Conjugate-13  06/10/2015   Pneumococcal Polysaccharide-23 06/30/2013   Respiratory Syncytial Virus Vaccine,Recomb Aduvanted(Arexvy) 10/07/2022   Tdap 10/25/2021   Zoster Recombinat (Shingrix) 07/06/2018, 08/15/2021   Zoster, Live 06/30/2013    Past Medical History:  Diagnosis Date   Asthma    Dyspnea    Elevated blood pressure    Enlarged prostate    GERD (gastroesophageal reflux disease)    Glaucoma    History of adenomatous polyp of colon    Hx of colonic polyps    Hypertension    Leukopenia    has had an extensive w/up.     Liver hemangioma 01/24/2018   Thyroid goiter     Tobacco History: Social History   Tobacco Use  Smoking Status Never  Smokeless Tobacco Never   Counseling given: Not Answered   Outpatient Medications Prior to Visit  Medication Sig Dispense Refill   amLODipine (NORVASC) 10 MG tablet Take 1 tablet (10 mg total) by mouth daily. 90 tablet 3   ASPIRIN 81 PO Take by mouth.     cetirizine (ZYRTEC) 5 MG tablet Take 1 tablet (5 mg total) by mouth daily. 30 tablet 5   ferrous sulfate 325 (65 FE) MG EC tablet Take 325 mg by mouth daily.     finasteride (PROSCAR) 5 MG tablet TAKE 1 TABLET (5 MG TOTAL) BY MOUTH DAILY. 90 tablet 3   fluticasone (FLONASE) 50 MCG/ACT nasal spray Place 2 sprays into both nostrils as needed.     fluticasone furoate-vilanterol (BREO ELLIPTA) 200-25 MCG/ACT AEPB Inhale 1 puff into the lungs daily. 28 each 11   latanoprost (XALATAN) 0.005 % ophthalmic solution ADMINISTER 1 DROP TO THE RIGHT EYE NIGHTLY.     montelukast (SINGULAIR) 10 MG tablet Take 1 tablet (10 mg total) by mouth at bedtime. 30 tablet 5   olmesartan (BENICAR) 20 MG tablet TAKE 1/2 TABLET BY MOUTH DAILY 45 tablet 2   omeprazole (PRILOSEC) 40 MG capsule TAKE 1 CAPSULE (40 MG TOTAL) BY MOUTH DAILY. 90 capsule 1   rosuvastatin (CRESTOR) 10 MG tablet TAKE 1 TABLET BY MOUTH EVERY DAY 90 tablet 1   sucralfate (CARAFATE) 1 g tablet TAKE 1 TABLET (1 G TOTAL) BY MOUTH 4 TIMES A DAY WITH  MEALS AND AT BEDTIME 120 tablet 0   tamsulosin (FLOMAX) 0.4 MG CAPS capsule TAKE 1 CAPSULE BY MOUTH EVERY DAY 90 capsule 3   timolol (TIMOPTIC) 0.5 % ophthalmic solution Place 1 drop into the right eye 2 (two) times daily.     vitamin B-12 (CYANOCOBALAMIN) 1000 MCG tablet Take by mouth.     albuterol (VENTOLIN HFA) 108 (90 Base) MCG/ACT inhaler INHALE 2 PUFFS BY MOUTH EVERY 6 HOURS AS NEEDED 18 each 2   No facility-administered medications prior to visit.     Review of Systems:   Constitutional: No weight loss or gain, night sweats, fevers, chills, fatigue, or lassitude. HEENT: No headaches, difficulty swallowing, tooth/dental problems, or sore throat. No itching, ear ache, sneezing. +occasional nasal congestion, post nasal drip CV:  No chest pain, orthopnea, PND, swelling in lower extremities, anasarca, dizziness, palpitations,  syncope Resp: +shortness of breath with exertion (baseline); wheezing (resolved); rare cough. No excess mucus or change in color of mucus. No hemoptysis. No chest wall deformity GI:  No heartburn, indigestion, abdominal pain, nausea, vomiting, diarrhea, change in bowel habits, loss of appetite, bloody stools.  GU: No dysuria, change in color of urine, urgency or frequency.  No flank pain, no hematuria  Skin: No rash, lesions, ulcerations MSK:  No joint pain or swelling.   Neuro: No dizziness or lightheadedness.  Psych: No depression or anxiety. Mood stable.     Physical Exam:  BP 120/64 (BP Location: Left Arm, Cuff Size: Normal)   Pulse (!) 52   Temp 97.6 F (36.4 C) (Temporal)   Ht 5\' 10"  (1.778 m)   Wt 147 lb 9.6 oz (67 kg)   SpO2 100%   BMI 21.18 kg/m   GEN: Pleasant, interactive, well-appearing; in no acute distress HEENT:  Normocephalic and atraumatic. EACs patent bilaterally. TM pearly gray with present light reflex bilaterally. PERRLA. Sclera white. Nasal turbinates boggy, moist and patent bilaterally. No rhinorrhea present. Oropharynx pink and  moist, without exudate or edema. No lesions, ulcerations, or postnasal drip.  NECK:  Supple w/ fair ROM. No JVD present. Normal carotid impulses w/o bruits. Thyroid symmetrical with no goiter or nodules palpated. No lymphadenopathy.   CV: RRR, no m/r/g, no peripheral edema. Pulses intact, +2 bilaterally. No cyanosis, pallor or clubbing. PULMONARY:  Unlabored, regular breathing. Clear bilaterally A&P w/o wheezes/rales/rhonchi. No accessory muscle use.  GI: BS present and normoactive. Soft, non-tender to palpation. No organomegaly or masses detected. MSK: No erythema, warmth or tenderness. Cap refil <2 sec all extrem. No deformities or joint swelling noted.  Neuro: A/Ox3. No focal deficits noted.   Skin: Warm, no lesions or rashe Psych: Normal affect and behavior. Judgement and thought content appropriate.     Lab Results:  CBC    Component Value Date/Time   WBC 3.1 (L) 08/25/2022 1428   WBC 3.0 (L) 06/16/2022 0845   RBC 4.70 08/25/2022 1428   RBC 4.68 06/16/2022 0845   HGB 13.0 08/25/2022 1428   HCT 39.8 08/25/2022 1428   PLT 152 08/25/2022 1428   MCV 85 08/25/2022 1428   MCV 84 04/16/2014 1927   MCH 27.7 08/25/2022 1428   MCH 27.0 05/25/2022 1735   MCHC 32.7 08/25/2022 1428   MCHC 32.8 06/16/2022 0845   RDW 13.5 08/25/2022 1428   RDW 15.1 (H) 04/16/2014 1927   LYMPHSABS 0.8 08/25/2022 1428   LYMPHSABS 0.6 (L) 03/22/2014 1827   MONOABS 0.2 06/16/2022 0845   MONOABS 0.6 03/22/2014 1827   EOSABS 0.3 08/25/2022 1428   EOSABS 0.1 03/22/2014 1827   BASOSABS 0.0 08/25/2022 1428   BASOSABS 0.0 03/22/2014 1827    BMET    Component Value Date/Time   NA 138 07/10/2022 0933   NA 130 (L) 04/16/2014 1927   K 4.2 07/10/2022 0933   K 3.8 04/16/2014 1927   CL 103 07/10/2022 0933   CL 95 (L) 04/16/2014 1927   CO2 28 07/10/2022 0933   CO2 26 04/16/2014 1927   GLUCOSE 80 07/10/2022 0933   GLUCOSE 117 (H) 04/16/2014 1927   BUN 14 07/10/2022 0933   BUN 7 (L) 08/22/2018 1440   BUN  16 04/16/2014 1927   CREATININE 1.33 07/10/2022 0933   CREATININE 1.60 (H) 04/16/2014 1927   CALCIUM 9.6 07/10/2022 0933   CALCIUM 9.8 04/16/2014 1927   GFRNONAA 59 (L) 05/25/2022 1758   GFRNONAA 42 (  L) 04/16/2014 1927   GFRAA 68 08/22/2018 1440   GFRAA 49 (L) 04/16/2014 1927    BNP    Component Value Date/Time   BNP 60.0 08/21/2017 0136     Imaging:  No results found.       Latest Ref Rng & Units 09/12/2022    2:54 PM  PFT Results  FVC-Pre L 1.93   FVC-Predicted Pre % 47   FVC-Post L 1.95   FVC-Predicted Post % 48   Pre FEV1/FVC % % 66   Post FEV1/FCV % % 65   FEV1-Pre L 1.28   FEV1-Predicted Pre % 44   FEV1-Post L 1.28   DLCO uncorrected ml/min/mmHg 20.15   DLCO UNC% % 82   DLVA Predicted % 115   TLC L 5.27   TLC % Predicted % 74   RV % Predicted % 119     Lab Results  Component Value Date   NITRICOXIDE 27 09/13/2022        Assessment & Plan:   Asthma Compensated on current regimen. Instructed him to resume singulair for trigger prevention. Seems to be some confusion surrounding medications. Re-educated today. Verbalized understanding. Action plan in place.   Patient Instructions  Continue Breo 1 puff daily. Brush tongue and rinse mouth afterwards Continue Albuterol inhaler 2 puffs or 3 mL neb every 6 hours as needed for shortness of breath or wheezing. Notify if symptoms persist despite rescue inhaler/neb use.  Restart cetirizine 1 tab daily for allergies Restart montelukast 1 tab At bedtime for allergies/asthma    Follow up in 4 months with Dr. Alveta Heimlich. If symptoms do not improve or worsen, please contact office for sooner follow up or seek emergency care.    Allergic rhinitis Improved. See above     I spent 25 minutes of dedicated to the care of this patient on the date of this encounter to include pre-visit review of records, face-to-face time with the patient discussing conditions above, post visit ordering of testing, clinical  documentation with the electronic health record, making appropriate referrals as documented, and communicating necessary findings to members of the patients care team.  Noemi Chapel, NP 03/30/2023  Pt aware and understands NP's role.

## 2023-03-27 NOTE — Patient Instructions (Addendum)
Continue Breo 1 puff daily. Brush tongue and rinse mouth afterwards Continue Albuterol inhaler 2 puffs or 3 mL neb every 6 hours as needed for shortness of breath or wheezing. Notify if symptoms persist despite rescue inhaler/neb use.  Restart cetirizine 1 tab daily for allergies Restart montelukast 1 tab At bedtime for allergies/asthma    Follow up in 4 months with Dr. Alveta Heimlich. If symptoms do not improve or worsen, please contact office for sooner follow up or seek emergency care.

## 2023-03-29 ENCOUNTER — Ambulatory Visit (INDEPENDENT_AMBULATORY_CARE_PROVIDER_SITE_OTHER): Payer: Medicare Other | Admitting: Nurse Practitioner

## 2023-03-29 VITALS — BP 110/68 | HR 60 | Temp 97.9°F | Resp 16 | Ht 70.0 in | Wt 148.2 lb

## 2023-03-29 DIAGNOSIS — M7989 Other specified soft tissue disorders: Secondary | ICD-10-CM

## 2023-03-29 NOTE — Patient Instructions (Signed)
Wrap the foot with Elastic bandage. Keep to foot elevated and watch the salt intake. If symptoms not improving call the office.

## 2023-03-29 NOTE — Progress Notes (Signed)
Established Patient Office Visit  Subjective:  Patient ID: Jose Perry, male    DOB: 1941-08-06  Age: 82 y.o. MRN: [id number]  CC:  Chief Complaint  Patient presents with   Foot Swelling    HPI  Jose Perry presents for swelling in the ankle bilaterally from a month. He state the swelling gets better after elevating the foot but does not completely goes away.   Denies SOB, chest pain.   HPI   Past Medical History:  Diagnosis Date   Asthma    Dyspnea    Elevated blood pressure    Enlarged prostate    GERD (gastroesophageal reflux disease)    Glaucoma    History of adenomatous polyp of colon    Hx of colonic polyps    Hypertension    Leukopenia    has had an extensive w/up.     Liver hemangioma 01/24/2018   Thyroid goiter     Past Surgical History:  Procedure Laterality Date   BACK SURGERY  2002   ruptured disc   CATARACT EXTRACTION W/PHACO Right 06/30/2020   Procedure: CATARACT EXTRACTION PHACO AND INTRAOCULAR LENS PLACEMENT (IOC) RIGHT, M[place]ugin 7.19 01:30.8 7.9%;  Surgeon: Lockie Mola, MD;  Location: [facility];  Service: Ophthalmology;  Laterality: Right;   COLONOSCOPY W/ POLYPECTOMY  04/24/2005, 07/06/2010, 07/08/2014   COLONOSCOPY WITH PROPOFOL N/A 02/01/2018   Procedure: COLONOSCOPY WITH PROPOFOL;  Surgeon: Scot Jun, MD;  Location: [facility];  Service: Endoscopy;  Laterality: N/A;   ESOPHAGOGASTRODUODENOSCOPY  04/24/2005, 07/06/2010,   ESOPHAGOGASTRODUODENOSCOPY (EGD) WITH PROPOFOL N/A 05/10/2022   Procedure: ESOPHAGOGASTRODUODENOSCOPY (EGD) WITH PROPOFOL;  Surgeon: Toledo, Boykin Nearing, MD;  Location: [facility] ENDOSCOPY;  Service: Gastroenterology;  Laterality: N/A;   EYE SURGERY  2009   relieve pressure from glaucoma   THYROID SURGERY  1968   goiter    Family History  Problem Relation Age of Onset   Heart disease Mother    Alcohol abuse Father    Hyperlipidemia Father    Stroke Father     Social History   Socioeconomic  History   Marital status: Married    Spouse name: Not on file   Number of children: Not on file   Years of education: Not on file   Highest education level: 12th grade  Occupational History   Not on file  Tobacco Use   Smoking status: Never   Smokeless tobacco: Never  Vaping Use   Vaping Use: Never used  Substance and Sexual Activity   Alcohol use: No    Alcohol/week: 0.0 standard drinks of alcohol   Drug use: No   Sexual activity: Yes  Other Topics Concern   Not on file  Social History Narrative   Not on file   Social Determinants of Health   Financial Resource Strain: Low Risk  (04/09/2023)   Overall Financial Resource Strain (CARDIA)    Difficulty of Paying Living Expenses: Not hard at all  Food Insecurity: No Food Insecurity (04/09/2023)   Hunger Vital Sign    Worried About Running Out of Food in the Last Year: Never true    Ran Out of Food in the Last Year: Never true  Transportation Needs: No Transportation Needs (04/09/2023)   PRAPARE - Administrator, Civil Service (Medical): No    Lack of Transportation (Non-Medical): No  Physical Activity: Sufficiently Active (04/09/2023)   Exercise Vital Sign    Days of Exercise per Week: 7 days    Minutes  of Exercise per Session: 80 min  Stress: No Stress Concern Present (04/09/2023)   [employer] of Occupational Health - Occupational Stress Questionnaire    Feeling of Stress : Not at all  Social Connections: Unknown (04/09/2023)   Social Connection and Isolation Panel [NHANES]    Frequency of Communication with Friends and Family: Once a week    Frequency of Social Gatherings with Friends and Family: Not on file    Attends Religious Services: Never    Database administrator or Organizations: No    Attends Engineer, structural: Not on file    Marital Status: Married  Catering manager Violence: Not At Risk (10/27/2022)   Humiliation, Afraid, Rape, and Kick questionnaire    Fear of Current or Ex-Partner: No     Emotionally Abused: No    Physically Abused: No    Sexually Abused: No     Outpatient Medications Prior to Visit  Medication Sig Dispense Refill   albuterol (VENTOLIN HFA) 108 (90 Base) MCG/ACT inhaler Inhale 2 puffs into the lungs every 6 (six) hours as needed. 18 each 2   ASPIRIN 81 PO Take by mouth.     cetirizine (ZYRTEC) 5 MG tablet Take 1 tablet (5 mg total) by mouth daily. (Patient not taking: Reported on 04/11/2023) 30 tablet 5   ferrous sulfate 325 (65 FE) MG EC tablet Take 325 mg by mouth daily.     finasteride (PROSCAR) 5 MG tablet TAKE 1 TABLET (5 MG TOTAL) BY MOUTH DAILY. 90 tablet 3   fluticasone (FLONASE) 50 MCG/ACT nasal spray Place 2 sprays into both nostrils as needed.     fluticasone furoate-vilanterol (BREO ELLIPTA) 200-25 MCG/ACT AEPB Inhale 1 puff into the lungs daily. 28 each 11   latanoprost (XALATAN) 0.005 % ophthalmic solution ADMINISTER 1 DROP TO THE RIGHT EYE NIGHTLY.     montelukast (SINGULAIR) 10 MG tablet Take 1 tablet (10 mg total) by mouth at bedtime. (Patient not taking: Reported on 04/11/2023) 30 tablet 5   rosuvastatin (CRESTOR) 10 MG tablet TAKE 1 TABLET BY MOUTH EVERY DAY 90 tablet 1   timolol (TIMOPTIC) 0.5 % ophthalmic solution Place 1 drop into the right eye 2 (two) times daily.     vitamin B-12 (CYANOCOBALAMIN) 1000 MCG tablet Take by mouth.     amLODipine (NORVASC) 10 MG tablet Take 1 tablet (10 mg total) by mouth daily. 90 tablet 3   olmesartan (BENICAR) 20 MG tablet TAKE 1/2 TABLET BY MOUTH DAILY 45 tablet 2   omeprazole (PRILOSEC) 40 MG capsule TAKE 1 CAPSULE (40 MG TOTAL) BY MOUTH DAILY. 90 capsule 1   sucralfate (CARAFATE) 1 g tablet TAKE 1 TABLET (1 G TOTAL) BY MOUTH 4 TIMES A DAY WITH MEALS AND AT BEDTIME (Patient not taking: Reported on 04/02/2023) 120 tablet 0   tamsulosin (FLOMAX) 0.4 MG CAPS capsule TAKE 1 CAPSULE BY MOUTH EVERY DAY 90 capsule 3   No facility-administered medications prior to visit.    Allergies  Allergen Reactions    Brimonidine     Other reaction(s): Eye Redness   Brimonidine Tartrate     Other reaction(s): Eye redness   Brinzolamide-Brimonidine     Other reaction(s): Eye redness   Other Other (See Comments)    Simbrinza Eye Drops gives pt Eye redness    ROS Review of Systems Negative unless indicated in HPI.    Objective:    Physical Exam Constitutional:      Appearance: Normal appearance.  HENT:  Head: Normocephalic.  Cardiovascular:     Rate and Rhythm: Normal rate and regular rhythm.     Pulses: Normal pulses.     Heart sounds: Normal heart sounds.  Pulmonary:     Effort: Pulmonary effort is normal.     Breath sounds: Normal breath sounds. No stridor. No wheezing.  Musculoskeletal:     Right lower leg: Edema present.     Left lower leg: Edema present.     Comments: L>R  Neurological:     General: No focal deficit present.     Mental Status: He is alert and oriented to person, place, and time. Mental status is at baseline.  Psychiatric:        Mood and Affect: Mood normal.        Behavior: Behavior normal.        Thought Content: Thought content normal.     BP 110/68   Pulse 60   Temp 97.9 F (36.6 C)   Resp 16   Ht 5\' 10"  (1.778 m)   Wt 148 lb 3.2 oz (67.2 kg)   SpO2 97%   BMI 21.26 kg/m  Wt Readings from Last 3 Encounters:  04/11/23 147 lb (66.7 kg)  04/02/23 147 lb 12.8 oz (67 kg)  03/29/23 148 lb 3.2 oz (67.2 kg)     Health Maintenance  Topic Date Due   COVID-19 Vaccine (7 - 2023-24 season) 08/23/2022   INFLUENZA VACCINE  05/10/2023   Medicare Annual Wellness (AWV)  10/28/2023   DTaP/Tdap/Td (2 - Td or Tdap) 10/26/2031   Pneumonia Vaccine 68+ Years old  Completed   Zoster Vaccines- Shingrix  Completed   HPV VACCINES  Aged Out   Hepatitis C Screening  Discontinued    There are no preventive care reminders to display for this patient.  Lab Results  Component Value Date   TSH 1.21 06/16/2022   Lab Results  Component Value Date   WBC 3.1 (L)  08/25/2022   HGB 13.0 08/25/2022   HCT 39.8 08/25/2022   MCV 85 08/25/2022   PLT 152 08/25/2022   Lab Results  Component Value Date   NA 138 07/10/2022   K 4.2 07/10/2022   CO2 28 07/10/2022   GLUCOSE 80 07/10/2022   BUN 14 07/10/2022   CREATININE 1.33 07/10/2022   BILITOT 0.6 06/16/2022   ALKPHOS 75 06/16/2022   AST 23 06/16/2022   ALT 17 06/16/2022   PROT 6.6 06/16/2022   ALBUMIN 4.1 06/16/2022   CALCIUM 9.6 07/10/2022   ANIONGAP 7 05/25/2022   GFR 50.50 (L) 07/10/2022   Lab Results  Component Value Date   CHOL 151 05/15/2022   Lab Results  Component Value Date   HDL 73 05/15/2022   Lab Results  Component Value Date   LDLCALC 71 05/15/2022   Lab Results  Component Value Date   TRIG 36 05/15/2022   Lab Results  Component Value Date   CHOLHDL 2.1 05/15/2022   No results found for: "HGBA1C"    Assessment & Plan:  Swelling of lower extremity Assessment & Plan: Bilateral lower extremity edema L>R. Advised patient to follow low-salt diet. Encouraged to keep the feet elevated and wrap with elastic bandage provided during the visit. Will continue to monitor.     Follow-up: No follow-ups on file.   Kara Dies, NP

## 2023-03-30 ENCOUNTER — Encounter: Payer: Self-pay | Admitting: Nurse Practitioner

## 2023-03-30 NOTE — Progress Notes (Signed)
Agree with the details of the visit as noted by Katherine Cobb, NP. ° °C. Laura Skyelar Swigart, MD °Advanced Bronchoscopy °[facility] ° °

## 2023-03-30 NOTE — Addendum Note (Signed)
Addended by: Noemi Chapel on: 03/30/2023 09:49 AM   Modules accepted: Level of Service

## 2023-03-30 NOTE — Assessment & Plan Note (Signed)
Improved.  See above. 

## 2023-03-30 NOTE — Assessment & Plan Note (Signed)
Compensated on current regimen. Instructed him to resume singulair for trigger prevention. Seems to be some confusion surrounding medications. Re-educated today. Verbalized understanding. Action plan in place.   Patient Instructions  Continue Breo 1 puff daily. Brush tongue and rinse mouth afterwards Continue Albuterol inhaler 2 puffs or 3 mL neb every 6 hours as needed for shortness of breath or wheezing. Notify if symptoms persist despite rescue inhaler/neb use.  Restart cetirizine 1 tab daily for allergies Restart montelukast 1 tab At bedtime for allergies/asthma    Follow up in 4 months with Dr. Alveta Heimlich. If symptoms do not improve or worsen, please contact office for sooner follow up or seek emergency care.

## 2023-04-02 ENCOUNTER — Ambulatory Visit: Payer: Medicare Other | Attending: Cardiology | Admitting: Cardiology

## 2023-04-02 ENCOUNTER — Encounter: Payer: Self-pay | Admitting: Cardiology

## 2023-04-02 VITALS — BP 134/84 | HR 57 | Ht 70.0 in | Wt 147.8 lb

## 2023-04-02 DIAGNOSIS — I10 Essential (primary) hypertension: Secondary | ICD-10-CM | POA: Insufficient documentation

## 2023-04-02 DIAGNOSIS — E7800 Pure hypercholesterolemia, unspecified: Secondary | ICD-10-CM | POA: Insufficient documentation

## 2023-04-02 DIAGNOSIS — I2510 Atherosclerotic heart disease of native coronary artery without angina pectoris: Secondary | ICD-10-CM | POA: Insufficient documentation

## 2023-04-02 MED ORDER — OLMESARTAN MEDOXOMIL 40 MG PO TABS: 40.0000 mg | ORAL_TABLET | Freq: Every day | ORAL | 3 refills | Status: DC

## 2023-04-02 MED ORDER — AMLODIPINE BESYLATE 5 MG PO TABS: 5.0000 mg | ORAL_TABLET | Freq: Every day | ORAL | 3 refills | Status: DC

## 2023-04-02 NOTE — Patient Instructions (Signed)
Medication Instructions:   DECREASE Amlodipine - Take one tablet ( 5mg ) by mouth daily.  INCREASE Benicar - Take one tablet ( 40mg ) by mouth daily.   *If you need a refill on your cardiac medications before your next appointment, please call your pharmacy*   Lab Work:  None Ordered  If you have labs (blood work) drawn today and your tests are completely normal, you will receive your results only by: MyChart Message (if you have MyChart) OR A paper copy in the mail If you have any lab test that is abnormal or we need to change your treatment, we will call you to review the results.   Testing/Procedures:  None Ordered   Follow-Up: At [facility], you and your health needs are our priority.  As part of our continuing mission to provide you with exceptional heart care, we have created designated Provider Care Teams.  These Care Teams include your primary Cardiologist (physician) and Advanced Practice Providers (APPs -  Physician Assistants and Nurse Practitioners) who all work together to provide you with the care you need, when you need it.  We recommend signing up for the patient portal called "MyChart".  Sign up information is provided on this After Visit Summary.  MyChart is used to connect with patients for Virtual Visits (Telemedicine).  Patients are able to view lab/test results, encounter notes, upcoming appointments, etc.  Non-urgent messages can be sent to your provider as well.   To learn more about what you can do with MyChart, go to [email or web address].    Your next appointment:   3 month(s)  Provider:   You may see Debbe Odea, MD or one of the following Advanced Practice Providers on your designated Care Team:   Nicolasa Ducking, NP Eula Listen, PA-C Cadence Fransico Michael, PA-C Charlsie Quest, NP

## 2023-04-02 NOTE — Progress Notes (Signed)
Cardiology Office Note:    Date:  04/02/2023   ID:  [name], [name] 1941/06/28, MRN [id number]  PCP:  Dale Hominy, MD   [facility] Providers Cardiologist:  Debbe Odea, MD     Referring MD: Dale [employer] [facility] [employer], MD   Chief Complaint  Patient presents with   Follow-up    Patient denies new or acute cardiac problems/concerns today.      History of Present Illness:    Jose Perry is a 82 y.o. male with a hx of Mild nonobstructive CAD (proximal LAD, D1, RCA ) coronary CT 8/23, hypertension, hyperlipidemia, GERD who presents for follow-up.    Being seen for elevated blood pressures, Norvasc previously increased to 10 mg daily.  He states blood pressure is well-controlled, although he has noticed leg edema.  Compliant with Benicar 20 mg daily.  Has no other concerns at this time.  Prior notes Echo 06/2022 EF 55 to 60% Coronary CT 05/2022 mild nonobstructive CAD in proximal LAD, D1, RCA.  Past Medical History:  Diagnosis Date   Asthma    Dyspnea    Elevated blood pressure    Enlarged prostate    GERD (gastroesophageal reflux disease)    Glaucoma    History of adenomatous polyp of colon    Hx of colonic polyps    Hypertension    Leukopenia    has had an extensive w/up.     Liver hemangioma 01/24/2018   Thyroid goiter     Past Surgical History:  Procedure Laterality Date   BACK SURGERY  2002   ruptured disc   CATARACT EXTRACTION W/PHACO Right 06/30/2020   Procedure: CATARACT EXTRACTION PHACO AND INTRAOCULAR LENS PLACEMENT (IOC) RIGHT, Malyugin 7.19 01:30.8 7.9%;  Surgeon: Lockie Mola, MD;  Location: [facility];  Service: Ophthalmology;  Laterality: Right;   COLONOSCOPY W/ POLYPECTOMY  04/24/2005, 07/06/2010, 07/08/2014   COLONOSCOPY WITH PROPOFOL N/A 02/01/2018   Procedure: COLONOSCOPY WITH PROPOFOL;  Surgeon: Scot Jun, MD;  Location: [facility];  Service: Endoscopy;  Laterality: N/A;   ESOPHAGOGASTRODUODENOSCOPY  04/24/2005,  07/06/2010,   ESOPHAGOGASTRODUODENOSCOPY (EGD) WITH PROPOFOL N/A 05/10/2022   Procedure: ESOPHAGOGASTRODUODENOSCOPY (EGD) WITH PROPOFOL;  Surgeon: Toledo, Boykin Nearing, MD;  Location: [facility] ENDOSCOPY;  Service: Gastroenterology;  Laterality: N/A;   EYE SURGERY  2009   relieve pressure from glaucoma   THYROID SURGERY  1968   goiter    Current Medications: Current Meds  Medication Sig   albuterol (VENTOLIN HFA) 108 (90 Base) MCG/ACT inhaler Inhale 2 puffs into the lungs every 6 (six) hours as needed.   ASPIRIN 81 PO Take by mouth.   cetirizine (ZYRTEC) 5 MG tablet Take 1 tablet (5 mg total) by mouth daily.   ferrous sulfate 325 (65 FE) MG EC tablet Take 325 mg by mouth daily.   finasteride (PROSCAR) 5 MG tablet TAKE 1 TABLET (5 MG TOTAL) BY MOUTH DAILY.   fluticasone (FLONASE) 50 MCG/ACT nasal spray Place 2 sprays into both nostrils as needed.   fluticasone furoate-vilanterol (BREO ELLIPTA) 200-25 MCG/ACT AEPB Inhale 1 puff into the lungs daily.   latanoprost (XALATAN) 0.005 % ophthalmic solution ADMINISTER 1 DROP TO THE RIGHT EYE NIGHTLY.   montelukast (SINGULAIR) 10 MG tablet Take 1 tablet (10 mg total) by mouth at bedtime.   omeprazole (PRILOSEC) 40 MG capsule TAKE 1 CAPSULE (40 MG TOTAL) BY MOUTH DAILY.   rosuvastatin (CRESTOR) 10 MG tablet TAKE 1 TABLET BY MOUTH EVERY DAY   tamsulosin (FLOMAX) 0.4 MG CAPS  capsule TAKE 1 CAPSULE BY MOUTH EVERY DAY   timolol (TIMOPTIC) 0.5 % ophthalmic solution Place 1 drop into the right eye 2 (two) times daily.   vitamin B-12 (CYANOCOBALAMIN) 1000 MCG tablet Take by mouth.   [DISCONTINUED] amLODipine (NORVASC) 10 MG tablet Take 1 tablet (10 mg total) by mouth daily.   [DISCONTINUED] olmesartan (BENICAR) 20 MG tablet TAKE 1/2 TABLET BY MOUTH DAILY     Allergies:   Brimonidine, Brimonidine tartrate, Brinzolamide-brimonidine, and Other   Social History   Socioeconomic History   Marital status: Married    Spouse name: Not on file   Number of children:  Not on file   Years of education: Not on file   Highest education level: Not on file  Occupational History   Not on file  Tobacco Use   Smoking status: Never   Smokeless tobacco: Never  Vaping Use   Vaping Use: Never used  Substance and Sexual Activity   Alcohol use: No    Alcohol/week: 0.0 standard drinks of alcohol   Drug use: No   Sexual activity: Yes  Other Topics Concern   Not on file  Social History Narrative   Not on file   Social Determinants of Health   Financial Resource Strain: Low Risk  (10/27/2022)   Overall Financial Resource Strain (CARDIA)    Difficulty of Paying Living Expenses: Not hard at all  Food Insecurity: No Food Insecurity (10/27/2022)   Hunger Vital Sign    Worried About Running Out of Food in the Last Year: Never true    Ran Out of Food in the Last Year: Never true  Transportation Needs: No Transportation Needs (10/27/2022)   PRAPARE - Administrator, Civil Service (Medical): No    Lack of Transportation (Non-Medical): No  Physical Activity: Sufficiently Active (10/27/2022)   Exercise Vital Sign    Days of Exercise per Week: 7 days    Minutes of Exercise per Session: 60 min  Stress: No Stress Concern Present (10/27/2022)   [employer] of Occupational Health - Occupational Stress Questionnaire    Feeling of Stress : Not at all  Social Connections: Unknown (10/27/2022)   Social Connection and Isolation Panel [NHANES]    Frequency of Communication with Friends and Family: Not on file    Frequency of Social Gatherings with Friends and Family: Not on file    Attends Religious Services: Not on file    Active Member of Clubs or Organizations: Not on file    Attends Banker Meetings: Not on file    Marital Status: Married     Family History: The patient's family history includes Alcohol abuse in his father; Heart disease in his mother; Hyperlipidemia in his father; Stroke in his father.  ROS:   Please see the history of  present illness.     All other systems reviewed and are negative.  EKGs/Labs/Other Studies Reviewed:    The following studies were reviewed today:   EKG:  EKG not ordered today.    Recent Labs: 06/16/2022: ALT 17; TSH 1.21 07/10/2022: BUN 14; Creatinine, Ser 1.33; Potassium 4.2; Sodium 138 08/25/2022: Hemoglobin 13.0; Platelets 152  Recent Lipid Panel    Component Value Date/Time   CHOL 151 05/15/2022 1451   TRIG 36 05/15/2022 1451   HDL 73 05/15/2022 1451   CHOLHDL 2.1 05/15/2022 1451   VLDL 7 05/15/2022 1451   LDLCALC 71 05/15/2022 1451     Risk Assessment/Calculations:  Physical Exam:    VS:  BP 134/84 (BP Location: Left Arm, Patient Position: Sitting, Cuff Size: Normal)   Pulse (!) 57   Ht 5\' 10"  (1.778 m)   Wt 147 lb 12.8 oz (67 kg)   SpO2 100%   BMI 21.21 kg/m     Wt Readings from Last 3 Encounters:  04/02/23 147 lb 12.8 oz (67 kg)  03/29/23 148 lb 3.2 oz (67.2 kg)  03/27/23 147 lb 9.6 oz (67 kg)     GEN:  Well nourished, well developed in no acute distress HEENT: Normal NECK: No JVD; No carotid bruits CARDIAC: RRR, no murmurs, rubs, gallops RESPIRATORY:  Clear to auscultation without rales, wheezing or rhonchi  ABDOMEN: Soft, non-tender, non-distended MUSCULOSKELETAL:  No edema; No deformity  SKIN: Warm and dry NEUROLOGIC:  Alert and oriented x 3 PSYCHIATRIC:  Normal affect   ASSESSMENT:    1. Coronary artery disease involving native heart without angina pectoris, unspecified vessel or lesion type   2. Pure hypercholesterolemia   3. Primary hypertension     PLAN:    In order of problems listed above:  Mild nonobstructive CAD (proximal LAD, D1, RCA ) coronary CT 8/23.  EF 55 to 60%.  Continue aspirin 81 mg daily, Crestor 10 mg daily. Hyperlipidemia, continue Crestor 10 mg. Hypertension, BP now controlled.  Patient has leg edema likely side effect of Norvasc.  Increase Benicar to 40 mg daily, reduce Norvasc to 5 mg daily.   Follow-up  in 3 months      Medication Adjustments/Labs and Tests Ordered: Current medicines are reviewed at length with the patient today.  Concerns regarding medicines are outlined above.  No orders of the defined types were placed in this encounter.  Meds ordered this encounter  Medications   olmesartan (BENICAR) 40 MG tablet    Sig: Take 1 tablet (40 mg total) by mouth daily.    Dispense:  90 tablet    Refill:  3   amLODipine (NORVASC) 5 MG tablet    Sig: Take 1 tablet (5 mg total) by mouth daily.    Dispense:  90 tablet    Refill:  3    Patient Instructions  Medication Instructions:   DECREASE Amlodipine - Take one tablet ( 5mg ) by mouth daily.  INCREASE Benicar - Take one tablet ( 40mg ) by mouth daily.   *If you need a refill on your cardiac medications before your next appointment, please call your pharmacy*   Lab Work:  None Ordered  If you have labs (blood work) drawn today and your tests are completely normal, you will receive your results only by: MyChart Message (if you have MyChart) OR A paper copy in the mail If you have any lab test that is abnormal or we need to change your treatment, we will call you to review the results.   Testing/Procedures:  None Ordered   Follow-Up: At [facility], you and your health needs are our priority.  As part of our continuing mission to provide you with exceptional heart care, we have created designated Provider Care Teams.  These Care Teams include your primary Cardiologist (physician) and Advanced Practice Providers (APPs -  Physician Assistants and Nurse Practitioners) who all work together to provide you with the care you need, when you need it.  We recommend signing up for the patient portal called "MyChart".  Sign up information is provided on this After Visit Summary.  MyChart is used to connect with patients for Virtual  Visits (Telemedicine).  Patients are able to view lab/test results, encounter notes, upcoming  appointments, etc.  Non-urgent messages can be sent to your provider as well.   To learn more about what you can do with MyChart, go to [email or web address].    Your next appointment:   3 month(s)  Provider:   You may see Debbe Odea, MD or one of the following Advanced Practice Providers on your designated Care Team:   Nicolasa Ducking, NP Eula Listen, PA-C Cadence Fransico Michael, PA-C Charlsie Quest, NP   Signed, Debbe Odea, MD  04/02/2023 2:38 PM    [facility]

## 2023-04-05 ENCOUNTER — Other Ambulatory Visit: Payer: Self-pay | Admitting: Urology

## 2023-04-05 DIAGNOSIS — N138 Other obstructive and reflux uropathy: Secondary | ICD-10-CM

## 2023-04-11 ENCOUNTER — Ambulatory Visit (INDEPENDENT_AMBULATORY_CARE_PROVIDER_SITE_OTHER): Payer: Medicare Other | Admitting: Internal Medicine

## 2023-04-11 ENCOUNTER — Encounter: Payer: Self-pay | Admitting: Internal Medicine

## 2023-04-11 VITALS — BP 128/54 | HR 54 | Temp 97.2°F | Ht 70.0 in | Wt 147.0 lb

## 2023-04-11 DIAGNOSIS — E041 Nontoxic single thyroid nodule: Secondary | ICD-10-CM | POA: Diagnosis not present

## 2023-04-11 DIAGNOSIS — R0602 Shortness of breath: Secondary | ICD-10-CM

## 2023-04-11 DIAGNOSIS — I10 Essential (primary) hypertension: Secondary | ICD-10-CM

## 2023-04-11 DIAGNOSIS — K219 Gastro-esophageal reflux disease without esophagitis: Secondary | ICD-10-CM

## 2023-04-11 DIAGNOSIS — D696 Thrombocytopenia, unspecified: Secondary | ICD-10-CM

## 2023-04-11 DIAGNOSIS — J4520 Mild intermittent asthma, uncomplicated: Secondary | ICD-10-CM

## 2023-04-11 DIAGNOSIS — R197 Diarrhea, unspecified: Secondary | ICD-10-CM

## 2023-04-11 MED ORDER — OMEPRAZOLE 40 MG PO CPDR: 40.0000 mg | DELAYED_RELEASE_CAPSULE | Freq: Every day | ORAL | 1 refills | Status: DC

## 2023-04-11 MED ORDER — VALSARTAN 80 MG PO TABS: 80.0000 mg | ORAL_TABLET | Freq: Every day | ORAL | 1 refills | Status: DC

## 2023-04-11 NOTE — Progress Notes (Unsigned)
Subjective:    Patient ID: Jose Perry, male    DOB: 08/30/1941, 82 y.o.   MRN: [id number]  Patient here for  Chief Complaint  Patient presents with   Leg Swelling    HPI Work in appt - legs/feet swelling. Recently evaluated by cardiology 04/02/23.  Amlodipine decreased to 5mg  q day.  Benicar increased to 40mg  q day.    Past Medical History:  Diagnosis Date   Asthma    Dyspnea    Elevated blood pressure    Enlarged prostate    GERD (gastroesophageal reflux disease)    Glaucoma    History of adenomatous polyp of colon    Hx of colonic polyps    Hypertension    Leukopenia    has had an extensive w/up.     Liver hemangioma 01/24/2018   Thyroid goiter    Past Surgical History:  Procedure Laterality Date   BACK SURGERY  2002   ruptured disc   CATARACT EXTRACTION W/PHACO Right 06/30/2020   Procedure: CATARACT EXTRACTION PHACO AND INTRAOCULAR LENS PLACEMENT (IOC) RIGHT, Malyugin 7.19 01:30.8 7.9%;  Surgeon: Lockie Mola, MD;  Location: [facility];  Service: Ophthalmology;  Laterality: Right;   COLONOSCOPY W/ POLYPECTOMY  04/24/2005, 07/06/2010, 07/08/2014   COLONOSCOPY WITH PROPOFOL N/A 02/01/2018   Procedure: COLONOSCOPY WITH PROPOFOL;  Surgeon: Scot Jun, MD;  Location[facility];  Service: Endoscopy;  Laterality: N/A;   ESOPHAGOGASTRODUODENOSCOPY  04/24/2005, 07/06/2010,   ESOPHAGOGASTRODUODENOSCOPY (EGD) WITH PROPOFOL N/A 05/10/2022   Procedure: ESOPHAGOGASTRODUODENOSCOPY (EGD) WITH PROPOFOL;  Surgeon: Toledo, Boykin Nearing, MD;  Location: [facility] ENDOSCOPY;  Service: Gastroenterology;  Laterality: N/A;   EYE SURGERY  2009   relieve pressure from glaucoma   THYROID SURGERY  1968   goiter   Family History  Problem Relation Age of Onset   Heart disease Mother    Alcohol abuse Father    Hyperlipidemia Father    Stroke Father    Social History   Socioeconomic History   Marital status: Married    Spouse name: Not on file   Number of children: Not  on file   Years of education: Not on file   Highest education level: 12th grade  Occupational History   Not on file  Tobacco Use   Smoking status: Never   Smokeless tobacco: Never  Vaping Use   Vaping Use: Never used  Substance and Sexual Activity   Alcohol use: No    Alcohol/week: 0.0 standard drinks of alcohol   Drug use: No   Sexual activity: Yes  Other Topics Concern   Not on file  Social History Narrative   Not on file   Social Determinants of Health   Financial Resource Strain: Low Risk  (04/09/2023)   Overall Financial Resource Strain (CARDIA)    Difficulty of Paying Living Expenses: Not hard at all  Food Insecurity: No Food Insecurity (04/09/2023)   Hunger Vital Sign    Worried About Running Out of Food in the Last Year: Never true    Ran Out of Food in the Last Year: Never true  Transportation Needs: No Transportation Needs (04/09/2023)   PRAPARE - Administrator, Civil Service (Medical): No    Lack of Transportation (Non-Medical): No  Physical Activity: Sufficiently Active (04/09/2023)   Exercise Vital Sign    Days of Exercise per Week: 7 days    Minutes of Exercise per Session: 80 min  Stress: No Stress Concern Present (04/09/2023)   [employer]  of Occupational Health - Occupational Stress Questionnaire    Feeling of Stress : Not at all  Social Connections: Unknown (04/09/2023)   Social Connection and Isolation Panel [NHANES]    Frequency of Communication with Friends and Family: Once a week    Frequency of Social Gatherings with Friends and Family: Not on file    Attends Religious Services: Never    Database administrator or Organizations: No    Attends Engineer, structural: Not on file    Marital Status: Married     Review of Systems     Objective:     BP (!) 128/54   Pulse (!) 54   Temp (!) 97.2 F (36.2 C) (Oral)   Ht 5\' 10"  (1.778 m)   Wt 147 lb (66.7 kg)   SpO2 93%   BMI 21.09 kg/m  Wt Readings from Last 3 Encounters:   04/11/23 147 lb (66.7 kg)  04/02/23 147 lb 12.8 oz (67 kg)  03/29/23 148 lb 3.2 oz (67.2 kg)    Physical Exam   Outpatient Encounter Medications as of 04/11/2023  Medication Sig   albuterol (VENTOLIN HFA) 108 (90 Base) MCG/ACT inhaler Inhale 2 puffs into the lungs every 6 (six) hours as needed.   amLODipine (NORVASC) 10 MG tablet Take 10 mg by mouth daily.   ASPIRIN 81 PO Take by mouth.   ferrous sulfate 325 (65 FE) MG EC tablet Take 325 mg by mouth daily.   finasteride (PROSCAR) 5 MG tablet TAKE 1 TABLET (5 MG TOTAL) BY MOUTH DAILY.   fluticasone (FLONASE) 50 MCG/ACT nasal spray Place 2 sprays into both nostrils as needed.   fluticasone furoate-vilanterol (BREO ELLIPTA) 200-25 MCG/ACT AEPB Inhale 1 puff into the lungs daily.   latanoprost (XALATAN) 0.005 % ophthalmic solution ADMINISTER 1 DROP TO THE RIGHT EYE NIGHTLY.   rosuvastatin (CRESTOR) 10 MG tablet TAKE 1 TABLET BY MOUTH EVERY DAY   tamsulosin (FLOMAX) 0.4 MG CAPS capsule TAKE 1 CAPSULE BY MOUTH EVERY DAY   timolol (TIMOPTIC) 0.5 % ophthalmic solution Place 1 drop into the right eye 2 (two) times daily.   valsartan (DIOVAN) 80 MG tablet Take 1 tablet (80 mg total) by mouth daily.   vitamin B-12 (CYANOCOBALAMIN) 1000 MCG tablet Take by mouth.   [DISCONTINUED] olmesartan (BENICAR) 40 MG tablet Take 1 tablet (40 mg total) by mouth daily. (Patient taking differently: Take 20 mg by mouth daily.)   [DISCONTINUED] omeprazole (PRILOSEC) 40 MG capsule TAKE 1 CAPSULE (40 MG TOTAL) BY MOUTH DAILY.   cetirizine (ZYRTEC) 5 MG tablet Take 1 tablet (5 mg total) by mouth daily. (Patient not taking: Reported on 04/11/2023)   montelukast (SINGULAIR) 10 MG tablet Take 1 tablet (10 mg total) by mouth at bedtime. (Patient not taking: Reported on 04/11/2023)   omeprazole (PRILOSEC) 40 MG capsule Take 1 capsule (40 mg total) by mouth daily.   [DISCONTINUED] amLODipine (NORVASC) 5 MG tablet Take 1 tablet (5 mg total) by mouth daily. (Patient not taking:  Reported on 04/11/2023)   No facility-administered encounter medications on file as of 04/11/2023.     Lab Results  Component Value Date   WBC 3.1 (L) 08/25/2022   HGB 13.0 08/25/2022   HCT 39.8 08/25/2022   PLT 152 08/25/2022   GLUCOSE 80 07/10/2022   CHOL 151 05/15/2022   TRIG 36 05/15/2022   HDL 73 05/15/2022   LDLCALC 71 05/15/2022   ALT 17 06/16/2022   AST 23 06/16/2022   NA 138  07/10/2022   K 4.2 07/10/2022   CL 103 07/10/2022   CREATININE 1.33 07/10/2022   BUN 14 07/10/2022   CO2 28 07/10/2022   TSH 1.21 06/16/2022   PSA 2.86 07/11/2021    No results found.     Assessment & Plan:  There are no diagnoses linked to this encounter.   Dale , MD

## 2023-04-12 ENCOUNTER — Encounter: Payer: Self-pay | Admitting: Internal Medicine

## 2023-04-12 NOTE — Assessment & Plan Note (Signed)
Follow cbc.  

## 2023-04-12 NOTE — Assessment & Plan Note (Signed)
Just evaluated by endocrinology - 10/2021.  Recommended f/u thyroid ultrasound in one year.   °

## 2023-04-12 NOTE — Assessment & Plan Note (Signed)
On prilosec. No significant acid reflux.  Follow.  

## 2023-04-12 NOTE — Assessment & Plan Note (Signed)
Has seen cardiology with w/up as outlined.  Had CTA - mild non obstructive CAD.  Has seen pulmonary.  Breathing overall stable.

## 2023-04-12 NOTE — Assessment & Plan Note (Signed)
Noticed with increased benicar.  When he reduced dose  - diarrhea improved.  No diarrhea now.  Follow.

## 2023-04-12 NOTE — Assessment & Plan Note (Signed)
On amlodipine and benicar.  Pressures remaining elevated and varying.  Given unable to increase benicar (due to intolerance), will change to diovan and see if tolerates better.  Hold on starting beta blocker - due to baseline heart rate 50s-60. Follow pressures.  Follow metabolic panel.

## 2023-04-12 NOTE — Assessment & Plan Note (Signed)
Using trelegy regularly.  Breathing overall stable.  No increased cough.  No significant acid reflux.  Follow.

## 2023-04-13 DIAGNOSIS — M7989 Other specified soft tissue disorders: Secondary | ICD-10-CM | POA: Insufficient documentation

## 2023-04-13 NOTE — Assessment & Plan Note (Addendum)
Bilateral lower extremity edema L>R. Advised patient to follow low-salt diet. Encouraged to keep the feet elevated and wrap with elastic bandage provided during the visit. Will continue to monitor.

## 2023-04-23 DIAGNOSIS — K219 Gastro-esophageal reflux disease without esophagitis: Secondary | ICD-10-CM | POA: Diagnosis not present

## 2023-04-23 DIAGNOSIS — Z86010 Personal history of colonic polyps: Secondary | ICD-10-CM | POA: Diagnosis not present

## 2023-05-03 ENCOUNTER — Other Ambulatory Visit: Payer: Self-pay | Admitting: Internal Medicine

## 2023-05-08 ENCOUNTER — Other Ambulatory Visit: Payer: Self-pay | Admitting: Internal Medicine

## 2023-05-09 ENCOUNTER — Encounter (INDEPENDENT_AMBULATORY_CARE_PROVIDER_SITE_OTHER): Payer: Self-pay

## 2023-05-09 NOTE — Telephone Encounter (Signed)
Please refuse. Pt now on diovan

## 2023-05-11 ENCOUNTER — Other Ambulatory Visit: Payer: Self-pay | Admitting: Nurse Practitioner

## 2023-05-11 DIAGNOSIS — J4550 Severe persistent asthma, uncomplicated: Secondary | ICD-10-CM

## 2023-05-21 ENCOUNTER — Other Ambulatory Visit: Payer: Self-pay | Admitting: Nurse Practitioner

## 2023-05-21 DIAGNOSIS — J4540 Moderate persistent asthma, uncomplicated: Secondary | ICD-10-CM

## 2023-06-06 ENCOUNTER — Other Ambulatory Visit (INDEPENDENT_AMBULATORY_CARE_PROVIDER_SITE_OTHER): Payer: Medicare Other

## 2023-06-06 DIAGNOSIS — E611 Iron deficiency: Secondary | ICD-10-CM

## 2023-06-06 DIAGNOSIS — I10 Essential (primary) hypertension: Secondary | ICD-10-CM

## 2023-06-06 LAB — CBC WITH DIFFERENTIAL/PLATELET
Basophils Absolute: 0 10*3/uL (ref 0.0–0.1)
Basophils Relative: 1.1 % (ref 0.0–3.0)
Eosinophils Absolute: 0.3 10*3/uL (ref 0.0–0.7)
Eosinophils Relative: 9 % — ABNORMAL HIGH (ref 0.0–5.0)
HCT: 41.3 % (ref 39.0–52.0)
Hemoglobin: 13.1 g/dL (ref 13.0–17.0)
Lymphocytes Relative: 28.3 % (ref 12.0–46.0)
Lymphs Abs: 0.9 10*3/uL (ref 0.7–4.0)
MCHC: 31.7 g/dL (ref 30.0–36.0)
MCV: 86.4 fl (ref 78.0–100.0)
Monocytes Absolute: 0.2 10*3/uL (ref 0.1–1.0)
Monocytes Relative: 7.8 % (ref 3.0–12.0)
Neutro Abs: 1.7 10*3/uL (ref 1.4–7.7)
Neutrophils Relative %: 53.8 % (ref 43.0–77.0)
Platelets: 134 10*3/uL — ABNORMAL LOW (ref 150.0–400.0)
RBC: 4.79 Mil/uL (ref 4.22–5.81)
RDW: 14.6 % (ref 11.5–15.5)
WBC: 3.1 10*3/uL — ABNORMAL LOW (ref 4.0–10.5)

## 2023-06-06 LAB — BASIC METABOLIC PANEL WITH GFR
BUN: 14 mg/dL (ref 6–23)
CO2: 32 meq/L (ref 19–32)
Calcium: 9.8 mg/dL (ref 8.4–10.5)
Chloride: 102 meq/L (ref 96–112)
Creatinine, Ser: 1.21 mg/dL (ref 0.40–1.50)
GFR: 56.21 mL/min — ABNORMAL LOW
Glucose, Bld: 82 mg/dL (ref 70–99)
Potassium: 3.9 meq/L (ref 3.5–5.1)
Sodium: 140 meq/L (ref 135–145)

## 2023-06-06 LAB — HEPATIC FUNCTION PANEL
ALT: 20 U/L (ref 0–53)
AST: 27 U/L (ref 0–37)
Albumin: 4.1 g/dL (ref 3.5–5.2)
Alkaline Phosphatase: 72 U/L (ref 39–117)
Bilirubin, Direct: 0.2 mg/dL (ref 0.0–0.3)
Total Bilirubin: 0.6 mg/dL (ref 0.2–1.2)
Total Protein: 6.5 g/dL (ref 6.0–8.3)

## 2023-06-06 LAB — IBC + FERRITIN
Ferritin: 186.6 ng/mL (ref 22.0–322.0)
Iron: 59 ug/dL (ref 42–165)
Saturation Ratios: 20 % (ref 20.0–50.0)
TIBC: 295.4 ug/dL (ref 250.0–450.0)
Transferrin: 211 mg/dL — ABNORMAL LOW (ref 212.0–360.0)

## 2023-06-06 LAB — LIPID PANEL
Cholesterol: 146 mg/dL (ref 0–200)
HDL: 74.5 mg/dL
LDL Cholesterol: 65 mg/dL (ref 0–99)
NonHDL: 71.88
Total CHOL/HDL Ratio: 2
Triglycerides: 35 mg/dL (ref 0.0–149.0)
VLDL: 7 mg/dL (ref 0.0–40.0)

## 2023-06-07 ENCOUNTER — Encounter: Payer: Self-pay | Admitting: Internal Medicine

## 2023-06-07 ENCOUNTER — Ambulatory Visit (INDEPENDENT_AMBULATORY_CARE_PROVIDER_SITE_OTHER): Payer: Medicare Other | Admitting: Internal Medicine

## 2023-06-07 VITALS — BP 128/72 | HR 59 | Temp 97.3°F | Ht 70.0 in | Wt 147.4 lb

## 2023-06-07 DIAGNOSIS — E785 Hyperlipidemia, unspecified: Secondary | ICD-10-CM | POA: Diagnosis not present

## 2023-06-07 DIAGNOSIS — D72819 Decreased white blood cell count, unspecified: Secondary | ICD-10-CM

## 2023-06-07 DIAGNOSIS — E041 Nontoxic single thyroid nodule: Secondary | ICD-10-CM

## 2023-06-07 DIAGNOSIS — Z86010 Personal history of colonic polyps: Secondary | ICD-10-CM

## 2023-06-07 DIAGNOSIS — D696 Thrombocytopenia, unspecified: Secondary | ICD-10-CM | POA: Diagnosis not present

## 2023-06-07 DIAGNOSIS — J4520 Mild intermittent asthma, uncomplicated: Secondary | ICD-10-CM

## 2023-06-07 DIAGNOSIS — Z860101 Personal history of adenomatous and serrated colon polyps: Secondary | ICD-10-CM

## 2023-06-07 DIAGNOSIS — I10 Essential (primary) hypertension: Secondary | ICD-10-CM

## 2023-06-07 DIAGNOSIS — K219 Gastro-esophageal reflux disease without esophagitis: Secondary | ICD-10-CM

## 2023-06-07 DIAGNOSIS — E611 Iron deficiency: Secondary | ICD-10-CM | POA: Diagnosis not present

## 2023-06-07 MED ORDER — VALSARTAN 160 MG PO TABS: 160.0000 mg | ORAL_TABLET | Freq: Every day | ORAL | 1 refills | Status: DC

## 2023-06-07 NOTE — Assessment & Plan Note (Signed)
Using trelegy regularly.  Breathing overall stable.  No increased cough.  No significant acid reflux.  Follow.

## 2023-06-07 NOTE — Addendum Note (Signed)
Addended by: Rita Ohara D on: 06/07/2023 02:55 PM   Modules accepted: Orders

## 2023-06-07 NOTE — Assessment & Plan Note (Signed)
Scheduled for colonoscopy in 07/2023.

## 2023-06-07 NOTE — Assessment & Plan Note (Signed)
Saw GI.  Scheduled for colonoscopy.  Follow cbc and iron studies.

## 2023-06-07 NOTE — Assessment & Plan Note (Signed)
Followed by endocrinology - 10/2021.  Recommended f/u thyroid ultrasound in one year.  Ultrasound 10/2022 - stable.  Recommended f/u in 2 years.

## 2023-06-07 NOTE — Assessment & Plan Note (Signed)
On prilosec. No significant acid reflux.  Follow.

## 2023-06-07 NOTE — Patient Instructions (Signed)
Increase diovan to 160mg  per day.  You can take two of your 80mg  (until they run out).

## 2023-06-07 NOTE — Assessment & Plan Note (Signed)
Follow cbc.  Overall stable.

## 2023-06-07 NOTE — Progress Notes (Signed)
Subjective:    Patient ID: Jose Perry, male    DOB: 1941/02/23, 82 y.o.   MRN: [id number]  Patient here for  Chief Complaint  Patient presents with   Medical Management of Chronic Issues    HPI Here to follow up regarding his hypercholesterolemia and hypertension.  He is doing well.  Stays active.  No chest pain or sob reported.  No increased cough or congestion.  No acid reflux. No abdominal pain or bowel change.  Overall he feels he is doing well.  States his blood pressure in the am is staying around the 150s systolic range.  Better as the day progresses.    Past Medical History:  Diagnosis Date   Asthma    Dyspnea    Elevated blood pressure    Enlarged prostate    GERD (gastroesophageal reflux disease)    Glaucoma    History of adenomatous polyp of colon    Hx of colonic polyps    Hypertension    Leukopenia    has had an extensive w/up.     Liver hemangioma 01/24/2018   Thyroid goiter    Past Surgical History:  Procedure Laterality Date   BACK SURGERY  2002   ruptured disc   CATARACT EXTRACTION W/PHACO Right 06/30/2020   Procedure: CATARACT EXTRACTION PHACO AND INTRAOCULAR LENS PLACEMENT (IOC) RIGHT, M[place]ugin 7.19 01:30.8 7.9%;  Surgeon: Lockie Mola, MD;  Location: [facility];  Service: Ophthalmology;  Laterality: Right;   COLONOSCOPY W/ POLYPECTOMY  04/24/2005, 07/06/2010, 07/08/2014   COLONOSCOPY WITH PROPOFOL N/A 02/01/2018   Procedure: COLONOSCOPY WITH PROPOFOL;  Surgeon: Scot Jun, MD;  Location: [facility];  Service: Endoscopy;  Laterality: N/A;   ESOPHAGOGASTRODUODENOSCOPY  04/24/2005, 07/06/2010,   ESOPHAGOGASTRODUODENOSCOPY (EGD) WITH PROPOFOL N/A 05/10/2022   Procedure: ESOPHAGOGASTRODUODENOSCOPY (EGD) WITH PROPOFOL;  Surgeon: Toledo, Boykin Nearing, MD;  Location: [facility] ENDOSCOPY;  Service: Gastroenterology;  Laterality: N/A;   EYE SURGERY  2009   relieve pressure from glaucoma   THYROID SURGERY  1968   goiter   Family History   Problem Relation Age of Onset   Heart disease Mother    Alcohol abuse Father    Hyperlipidemia Father    Stroke Father    Social History   Socioeconomic History   Marital status: Married    Spouse name: Not on file   Number of children: Not on file   Years of education: Not on file   Highest education level: 12th grade  Occupational History   Not on file  Tobacco Use   Smoking status: Never   Smokeless tobacco: Never  Vaping Use   Vaping status: Never Used  Substance and Sexual Activity   Alcohol use: No    Alcohol/week: 0.0 standard drinks of alcohol   Drug use: No   Sexual activity: Yes  Other Topics Concern   Not on file  Social History Narrative   Not on file   Social Determinants of Health   Financial Resource Strain: Low Risk  (04/09/2023)   Overall Financial Resource Strain (CARDIA)    Difficulty of Paying Living Expenses: Not hard at all  Food Insecurity: No Food Insecurity (04/09/2023)   Hunger Vital Sign    Worried About Running Out of Food in the Last Year: Never true    Ran Out of Food in the Last Year: Never true  Transportation Needs: No Transportation Needs (04/09/2023)   PRAPARE - Administrator, Civil Service (Medical): No  Lack of Transportation (Non-Medical): No  Physical Activity: Sufficiently Active (04/09/2023)   Exercise Vital Sign    Days of Exercise per Week: 7 days    Minutes of Exercise per Session: 80 min  Stress: No Stress Concern Present (04/09/2023)   [employer] of Occupational Health - Occupational Stress Questionnaire    Feeling of Stress : Not at all  Social Connections: Unknown (04/09/2023)   Social Connection and Isolation Panel [NHANES]    Frequency of Communication with Friends and Family: Once a week    Frequency of Social Gatherings with Friends and Family: Not on file    Attends Religious Services: Never    Database administrator or Organizations: No    Attends Engineer, structural: Not on file     Marital Status: Married     Review of Systems  Constitutional:  Negative for appetite change and unexpected weight change.  HENT:  Negative for congestion and sinus pressure.   Respiratory:  Negative for cough, chest tightness and shortness of breath.   Cardiovascular:  Negative for chest pain, palpitations and leg swelling.  Gastrointestinal:  Negative for abdominal pain, diarrhea, nausea and vomiting.  Genitourinary:  Negative for difficulty urinating and dysuria.  Musculoskeletal:  Negative for joint swelling and myalgias.  Skin:  Negative for color change and rash.  Neurological:  Negative for dizziness and headaches.  Psychiatric/Behavioral:  Negative for agitation and dysphoric mood.        Objective:     BP 128/72   Pulse (!) 59   Temp (!) 97.3 F (36.3 C) (Oral)   Ht 5\' 10"  (1.778 m)   Wt 147 lb 6.4 oz (66.9 kg)   SpO2 99%   BMI 21.15 kg/m  Wt Readings from Last 3 Encounters:  06/07/23 147 lb 6.4 oz (66.9 kg)  04/11/23 147 lb (66.7 kg)  04/02/23 147 lb 12.8 oz (67 kg)    Physical Exam Vitals reviewed.  Constitutional:      General: He is not in acute distress.    Appearance: Normal appearance. He is well-developed.  HENT:     Head: Normocephalic and atraumatic.     Right Ear: External ear normal.     Left Ear: External ear normal.  Eyes:     General: No scleral icterus.       Right eye: No discharge.        Left eye: No discharge.     Conjunctiva/sclera: Conjunctivae normal.  Cardiovascular:     Rate and Rhythm: Normal rate and regular rhythm.  Pulmonary:     Effort: Pulmonary effort is normal. No respiratory distress.     Breath sounds: Normal breath sounds.  Abdominal:     General: Bowel sounds are normal.     Palpations: Abdomen is soft.     Tenderness: There is no abdominal tenderness.  Musculoskeletal:        General: No swelling or tenderness.     Cervical back: Neck supple. No tenderness.  Lymphadenopathy:     Cervical: No cervical  adenopathy.  Skin:    Findings: No erythema or rash.  Neurological:     Mental Status: He is alert.  Psychiatric:        Mood and Affect: Mood normal.        Behavior: Behavior normal.      Outpatient Encounter Medications as of 06/07/2023  Medication Sig   valsartan (DIOVAN) 160 MG tablet Take 1 tablet (160 mg total) by mouth daily.  albuterol (VENTOLIN HFA) 108 (90 Base) MCG/ACT inhaler TAKE 2 PUFFS BY MOUTH EVERY 6 HOURS AS NEEDED   amLODipine (NORVASC) 10 MG tablet Take 10 mg by mouth daily.   ASPIRIN 81 PO Take by mouth.   ferrous sulfate 325 (65 FE) MG EC tablet Take 325 mg by mouth daily.   finasteride (PROSCAR) 5 MG tablet TAKE 1 TABLET (5 MG TOTAL) BY MOUTH DAILY.   fluticasone (FLONASE) 50 MCG/ACT nasal spray Place 2 sprays into both nostrils as needed.   fluticasone furoate-vilanterol (BREO ELLIPTA) 200-25 MCG/ACT AEPB Inhale 1 puff into the lungs daily.   latanoprost (XALATAN) 0.005 % ophthalmic solution ADMINISTER 1 DROP TO THE RIGHT EYE NIGHTLY.   montelukast (SINGULAIR) 10 MG tablet TAKE 1 TABLET BY MOUTH EVERYDAY AT BEDTIME   omeprazole (PRILOSEC) 40 MG capsule Take 1 capsule (40 mg total) by mouth daily.   rosuvastatin (CRESTOR) 10 MG tablet TAKE 1 TABLET BY MOUTH EVERY DAY   tamsulosin (FLOMAX) 0.4 MG CAPS capsule TAKE 1 CAPSULE BY MOUTH EVERY DAY   timolol (TIMOPTIC) 0.5 % ophthalmic solution Place 1 drop into the right eye 2 (two) times daily.   vitamin B-12 (CYANOCOBALAMIN) 1000 MCG tablet Take by mouth.   [DISCONTINUED] cetirizine (ZYRTEC) 5 MG tablet Take 1 tablet (5 mg total) by mouth daily. (Patient not taking: Reported on 04/11/2023)   [DISCONTINUED] valsartan (DIOVAN) 80 MG tablet TAKE 1 TABLET BY MOUTH EVERY DAY   No facility-administered encounter medications on file as of 06/07/2023.     Lab Results  Component Value Date   WBC 3.1 (L) 06/06/2023   HGB 13.1 06/06/2023   HCT 41.3 06/06/2023   PLT 134.0 (L) 06/06/2023   GLUCOSE 82 06/06/2023   CHOL  146 06/06/2023   TRIG 35.0 06/06/2023   HDL 74.50 06/06/2023   LDLCALC 65 06/06/2023   ALT 20 06/06/2023   AST 27 06/06/2023   NA 140 06/06/2023   K 3.9 06/06/2023   CL 102 06/06/2023   CREATININE 1.21 06/06/2023   BUN 14 06/06/2023   CO2 32 06/06/2023   TSH 1.21 06/16/2022   PSA 2.86 07/11/2021       Assessment & Plan:  Primary hypertension Assessment & Plan: Given increased blood pressure in am - will increase diovan to 160mg  q day.  Continue amlodipine.  Follow pressures.  Follow metabolic panel.    Mild intermittent asthma, unspecified whether complicated Assessment & Plan: Using trelegy regularly.  Breathing overall stable.  No increased cough.  No significant acid reflux.  Follow.     Gastroesophageal reflux disease, unspecified whether esophagitis present Assessment & Plan: On prilosec. No significant acid reflux.  Follow.    History of adenomatous polyp of colon Assessment & Plan: Scheduled for colonoscopy in 07/2023.    Iron deficiency Assessment & Plan: Saw GI.  Scheduled for colonoscopy.  Follow cbc and iron studies.    Leukopenia, unspecified type Assessment & Plan: Has been previously evaluated by hematology. Follow.  Stable - 3.1.    Thrombocytopenia (HCC) Assessment & Plan: Follow cbc.  Overall stable.    Thyroid nodule Assessment & Plan:  Followed by endocrinology - 10/2021.  Recommended f/u thyroid ultrasound in one year.  Ultrasound 10/2022 - stable.  Recommended f/u in 2 years.    Other orders -     Valsartan; Take 1 tablet (160 mg total) by mouth daily.  Dispense: 90 tablet; Refill: 1     Dale La Harpe, MD

## 2023-06-07 NOTE — Assessment & Plan Note (Signed)
Has been previously evaluated by hematology. Follow.  Stable - 3.1.

## 2023-06-07 NOTE — Assessment & Plan Note (Signed)
Given increased blood pressure in am - will increase diovan to 160mg  q day.  Continue amlodipine.  Follow pressures.  Follow metabolic panel.

## 2023-06-15 ENCOUNTER — Encounter: Payer: Self-pay | Admitting: Pulmonary Disease

## 2023-06-15 ENCOUNTER — Ambulatory Visit (INDEPENDENT_AMBULATORY_CARE_PROVIDER_SITE_OTHER): Payer: Medicare Other | Admitting: Pulmonary Disease

## 2023-06-15 VITALS — BP 126/78 | HR 61 | Temp 98.0°F | Ht 70.0 in | Wt 150.0 lb

## 2023-06-15 DIAGNOSIS — J4540 Moderate persistent asthma, uncomplicated: Secondary | ICD-10-CM | POA: Diagnosis not present

## 2023-06-15 LAB — NITRIC OXIDE: Nitric Oxide: 39

## 2023-06-15 MED ORDER — PREDNISONE 10 MG PO TABS: ORAL_TABLET | ORAL | 0 refills | Status: AC

## 2023-06-15 NOTE — Patient Instructions (Signed)
Prednisone 10 mg pill >> 3 pills daily for 2 days, 2 pills daily for 2 days, 1 pill daily for 2 days.  Follow up with Dr. Jayme Cloud in 6 weeks as previously scheduled.

## 2023-06-15 NOTE — Progress Notes (Signed)
[facility]  Name: Jose Perry MRN: [id number] DOB: 23-Dec-1940  Chief Complaint  Patient presents with   Acute Visit    Increased SOB, dry cough the past two days-produced grayish sputum today mixed with small trace of blood.     Summary: 81 yr old male followed by Dr. Jayme Cloud for asthma with eosinophilic and allergic phenotype..  Subjective: He has been feeling more short of breath recently.  He gets a tight feeling in his chest and has a dry cough.  He coughed up some phlegm this morning and there was some blood tinged streak in it.  Only happened one time.  Has some sinus congestion and drainage, but no nose bleeds.  Not having fever.  Symptoms do get better when he uses his albuterol.  No skin rash, GI symptoms, or leg swelling.  He doesn't smoke cigarettes.  No environmental, animal or sick exposures that he is aware of.    Chest xray from 05/25/22 was normal.  CBC from 02/20/23 showed WBC 2.7, Hb 13.3, PLT 150, 600 eosinophils.  Past medical history: He  has a past medical history of Asthma, Dyspnea, Elevated blood pressure, Enlarged prostate, GERD (gastroesophageal reflux disease), Glaucoma, History of adenomatous polyp of colon, colonic polyps, Hypertension, Leukopenia, Liver hemangioma (01/24/2018), and Thyroid goiter.  Vital signs: BP 126/78 (BP Location: Left Arm, Cuff Size: Normal)   Pulse 61   Temp 98 F (36.7 C) (Temporal)   Ht 5\' 10"  (1.778 m)   Wt 150 lb (68 kg)   SpO2 97%   BMI 21.52 kg/m   Physical exam:  General - pleasant ENT - no sinus tenderness, no stridor Cardiac - regular rate/rhythm, no murmur Chest - equal breath sounds b/l, no wheezing or rales Extremities - no cyanosis, clubbing, or edema Skin - no rashes Psych - normal mood and behavior Lymphatics - no lymphadenopathy  Studies: CT chest 09/12/17 >> lungs clear, coronary atherosclerosis, enlarged Lt thyroid Echo 06/13/22 >> EF 55 to 60%, grade 2 DD, RVSP 36.6 mmHg,  mod RA dilation, mild MR, ascending aorta 39 mm FeNO 07/2022 >> 57 RAST 08/25/22 >> IgE 325; grass, dust, trees, cats, mold A1AT 08/25/22 >> 124, MM PFT 09/12/22 >>  FEV1 1.28 (44%), FEV1% 65, TLC 5.29 (74%), DLCO 82% FeNO 06/15/23 >> 39  Assessment/plan:  Severe, persistent asthma with allergic and eosinophilic phenotype. - he has an acute exacerbation - prednisone taper - don't think he needs antibiotics at this time - blood streaking in sputum seems minimal and no history of cigarette smoking; don't think he needs a chest xray at this time, but advised him to get a chest xray if this gets worse - avoid LAMA's in setting of glaucoma and BPH - continue breo 200 one puff daily, singulair 10 mg nightly - prn albuterol - he has appointment with Dr. Jayme Cloud scheduled for 07/30/23 >> if symptoms persist, then might need to consider option of biologic agent to help with asthma control and reduce risk of exacerbations  Patient Instructions  Prednisone 10 mg pill >> 3 pills daily for 2 days, 2 pills daily for 2 days, 1 pill daily for 2 days.  Follow up with Dr. Jayme Cloud in 6 weeks as previously scheduled.  Allergies as of 06/15/2023       Reactions   Brimonidine    Other reaction(s): Eye Redness   Brimonidine Tartrate    Other reaction(s): Eye redness   Brinzolamide-brimonidine    Other reaction(s): Eye redness  Other Other (See Comments)   Simbrinza Eye Drops gives pt Eye redness        Medication List        Accurate as of June 15, 2023  3:50 PM. If you have any questions, ask your nurse or doctor.          albuterol 108 (90 Base) MCG/ACT inhaler Commonly known as: VENTOLIN HFA TAKE 2 PUFFS BY MOUTH EVERY 6 HOURS AS NEEDED   amLODipine 10 MG tablet Commonly known as: NORVASC Take 10 mg by mouth daily.   ASPIRIN 81 PO Take by mouth.   cyanocobalamin 1000 MCG tablet Commonly known as: VITAMIN B12 Take by mouth.   ferrous sulfate 325 (65 FE) MG EC  tablet Take 325 mg by mouth daily.   finasteride 5 MG tablet Commonly known as: PROSCAR TAKE 1 TABLET (5 MG TOTAL) BY MOUTH DAILY.   fluticasone 50 MCG/ACT nasal spray Commonly known as: FLONASE Place 2 sprays into both nostrils as needed.   fluticasone furoate-vilanterol 200-25 MCG/ACT Aepb Commonly known as: Breo Ellipta Inhale 1 puff into the lungs daily.   latanoprost 0.005 % ophthalmic solution Commonly known as: XALATAN ADMINISTER 1 DROP TO THE RIGHT EYE NIGHTLY.   montelukast 10 MG tablet Commonly known as: SINGULAIR TAKE 1 TABLET BY MOUTH EVERYDAY AT BEDTIME   omeprazole 40 MG capsule Commonly known as: PRILOSEC Take 1 capsule (40 mg total) by mouth daily.   predniSONE 10 MG tablet Commonly known as: DELTASONE Take 3 tablets (30 mg total) by mouth daily with breakfast for 2 days, THEN 2 tablets (20 mg total) daily with breakfast for 2 days, THEN 1 tablet (10 mg total) daily with breakfast for 2 days. Start taking on: June 15, 2023 Started by: Coralyn Helling   rosuvastatin 10 MG tablet Commonly known as: CRESTOR TAKE 1 TABLET BY MOUTH EVERY DAY   tamsulosin 0.4 MG Caps capsule Commonly known as: FLOMAX TAKE 1 CAPSULE BY MOUTH EVERY DAY   timolol 0.5 % ophthalmic solution Commonly known as: TIMOPTIC Place 1 drop into the right eye 2 (two) times daily.   valsartan 160 MG tablet Commonly known as: DIOVAN Take 1 tablet (160 mg total) by mouth daily.        Time spent: 35 minutes  Signature: Coralyn Helling, MD [facility] Pulmonary/Critical Care Pager - [phone] 06/15/2023, 3:50 PM

## 2023-07-04 ENCOUNTER — Ambulatory Visit: Payer: Medicare Other | Attending: Cardiology | Admitting: Cardiology

## 2023-07-04 ENCOUNTER — Encounter: Payer: Self-pay | Admitting: Cardiology

## 2023-07-04 VITALS — BP 124/64 | HR 47 | Ht 70.0 in | Wt 149.0 lb

## 2023-07-04 DIAGNOSIS — I2510 Atherosclerotic heart disease of native coronary artery without angina pectoris: Secondary | ICD-10-CM

## 2023-07-04 DIAGNOSIS — I10 Essential (primary) hypertension: Secondary | ICD-10-CM

## 2023-07-04 DIAGNOSIS — E7800 Pure hypercholesterolemia, unspecified: Secondary | ICD-10-CM

## 2023-07-04 DIAGNOSIS — Z23 Encounter for immunization: Secondary | ICD-10-CM | POA: Diagnosis not present

## 2023-07-04 MED ORDER — VALSARTAN 320 MG PO TABS: 160.0000 mg | ORAL_TABLET | Freq: Every day | ORAL | 1 refills | Status: DC

## 2023-07-04 NOTE — Progress Notes (Signed)
Cardiology Office Note:    Date:  07/04/2023   ID:  Jose, Perry September 01, 1941, MRN [id number]  PCP:  Jose Lake Goodwin, MD   [facility] Providers Cardiologist:  Jose Odea, MD     Referring MD: Jose Alex, MD   Chief Complaint  Patient presents with   Follow-up    3 month follow up after bp medication changes.  Patient reports elevated blood pressure on nightly readings.  Benicar was causing GI distress so PCP changed him to Valsartan 160 mg every day.  Amlodipine 10 mg cause LE edema.      History of Present Illness:    Jose Perry is a 82 y.o. male with a hx of Mild nonobstructive CAD (proximal LAD, D1, RCA ) coronary CT 8/23, hypertension, hyperlipidemia, GERD who presents for follow-up.    Being seen for nonobstructive CAD and hypertension.  Benicar previously increased to 40, this caused GI upset.  Benicar switched to valsartan per PCP.  Norvasc previously reduced to 5 mg daily due to leg edema.  Tolerating medications as prescribed, BP appears to be elevated in the p.m. with systolics in the 150s.  Prior notes Echo 06/2022 EF 55 to 60% Coronary CT 05/2022 mild nonobstructive CAD in proximal LAD, D1, RCA.  Past Medical History:  Diagnosis Date   Asthma    Dyspnea    Elevated blood pressure    Enlarged prostate    GERD (gastroesophageal reflux disease)    Glaucoma    History of adenomatous polyp of colon    Hx of colonic polyps    Hypertension    Leukopenia    has had an extensive w/up.     Liver hemangioma 01/24/2018   Thyroid goiter     Past Surgical History:  Procedure Laterality Date   BACK SURGERY  2002   ruptured disc   CATARACT EXTRACTION W/PHACO Right 06/30/2020   Procedure: CATARACT EXTRACTION PHACO AND INTRAOCULAR LENS PLACEMENT (IOC) RIGHT, M[place]ugin 7.19 01:30.8 7.9%;  Surgeon: Lockie Mola, MD;  Location: [facility];  Service: Ophthalmology;  Laterality: Right;   COLONOSCOPY W/ POLYPECTOMY  04/24/2005,  07/06/2010, 07/08/2014   COLONOSCOPY WITH PROPOFOL N/A 02/01/2018   Procedure: COLONOSCOPY WITH PROPOFOL;  Surgeon: Scot Jun, MD;  Location: [facility];  Service: Endoscopy;  Laterality: N/A;   ESOPHAGOGASTRODUODENOSCOPY  04/24/2005, 07/06/2010,   ESOPHAGOGASTRODUODENOSCOPY (EGD) WITH PROPOFOL N/A 05/10/2022   Procedure: ESOPHAGOGASTRODUODENOSCOPY (EGD) WITH PROPOFOL;  Surgeon: Toledo, Boykin Nearing, MD;  Location: [facility] ENDOSCOPY;  Service: Gastroenterology;  Laterality: N/A;   EYE SURGERY  2009   relieve pressure from glaucoma   THYROID SURGERY  1968   goiter    Current Medications: Current Meds  Medication Sig   albuterol (VENTOLIN HFA) 108 (90 Base) MCG/ACT inhaler TAKE 2 PUFFS BY MOUTH EVERY 6 HOURS AS NEEDED   amLODipine (NORVASC) 5 MG tablet Take 5 mg by mouth daily.   ASPIRIN 81 PO Take by mouth.   ferrous sulfate 325 (65 FE) MG EC tablet Take 325 mg by mouth daily.   finasteride (PROSCAR) 5 MG tablet TAKE 1 TABLET (5 MG TOTAL) BY MOUTH DAILY.   fluticasone (FLONASE) 50 MCG/ACT nasal spray Place 2 sprays into both nostrils as needed.   fluticasone furoate-vilanterol (BREO ELLIPTA) 200-25 MCG/ACT AEPB Inhale 1 puff into the lungs daily.   latanoprost (XALATAN) 0.005 % ophthalmic solution ADMINISTER 1 DROP TO THE RIGHT EYE NIGHTLY.   omeprazole (PRILOSEC) 40 MG capsule Take 1 capsule (40 mg total) by  mouth daily.   rosuvastatin (CRESTOR) 10 MG tablet TAKE 1 TABLET BY MOUTH EVERY DAY   tamsulosin (FLOMAX) 0.4 MG CAPS capsule TAKE 1 CAPSULE BY MOUTH EVERY DAY   timolol (TIMOPTIC) 0.5 % ophthalmic solution Place 1 drop into the right eye 2 (two) times daily.   vitamin B-12 (CYANOCOBALAMIN) 1000 MCG tablet Take by mouth.   [DISCONTINUED] valsartan (DIOVAN) 160 MG tablet Take 1 tablet (160 mg total) by mouth daily.     Allergies:   Brimonidine, Brimonidine tartrate, Brinzolamide-brimonidine, and Other   Social History   Socioeconomic History   Marital status: Married    Spouse  name: Not on file   Number of children: Not on file   Years of education: Not on file   Highest education level: 12th grade  Occupational History   Not on file  Tobacco Use   Smoking status: Never   Smokeless tobacco: Never  Vaping Use   Vaping status: Never Used  Substance and Sexual Activity   Alcohol use: No    Alcohol/week: 0.0 standard drinks of alcohol   Drug use: No   Sexual activity: Yes  Other Topics Concern   Not on file  Social History Narrative   Not on file   Social Determinants of Health   Financial Resource Strain: Low Risk  (04/09/2023)   Overall Financial Resource Strain (CARDIA)    Difficulty of Paying Living Expenses: Not hard at all  Food Insecurity: No Food Insecurity (04/09/2023)   Hunger Vital Sign    Worried About Running Out of Food in the Last Year: Never true    Ran Out of Food in the Last Year: Never true  Transportation Needs: No Transportation Needs (04/09/2023)   PRAPARE - Administrator, Civil Service (Medical): No    Lack of Transportation (Non-Medical): No  Physical Activity: Sufficiently Active (04/09/2023)   Exercise Vital Sign    Days of Exercise per Week: 7 days    Minutes of Exercise per Session: 80 min  Stress: No Stress Concern Present (04/09/2023)   [employer] of Occupational Health - Occupational Stress Questionnaire    Feeling of Stress : Not at all  Social Connections: Unknown (04/09/2023)   Social Connection and Isolation Panel [NHANES]    Frequency of Communication with Friends and Family: Once a week    Frequency of Social Gatherings with Friends and Family: Not on file    Attends Religious Services: Never    Database administrator or Organizations: No    Attends Engineer, structural: Not on file    Marital Status: Married     Family History: The patient's family history includes Alcohol abuse in his father; Heart disease in his mother; Hyperlipidemia in his father; Stroke in his father.  ROS:    Please see the history of present illness.     All other systems reviewed and are negative.  EKGs/Labs/Other Studies Reviewed:    The following studies were reviewed today:   EKG Interpretation Date/Time:  Wednesday July 04 2023 11:43:47 EDT Ventricular Rate:  47 PR Interval:  160 QRS Duration:  96 QT Interval:  398 QTC Calculation: 352 R Axis:   20  Text Interpretation: Sinus bradycardia Septal infarct , age undetermined Confirmed by Jose Perry ([id number]) on 07/04/2023 11:45:54 AM    Recent Labs: 06/06/2023: ALT 20; BUN 14; Creatinine, Ser 1.21; Hemoglobin 13.1; Platelets 134.0; Potassium 3.9; Sodium 140  Recent Lipid Panel    Component Value Date/Time  CHOL 146 06/06/2023 0834   TRIG 35.0 06/06/2023 0834   HDL 74.50 06/06/2023 0834   CHOLHDL 2 06/06/2023 0834   VLDL 7.0 06/06/2023 0834   LDLCALC 65 06/06/2023 0834     Risk Assessment/Calculations:          Physical Exam:    VS:  BP 124/64 (BP Location: Left Arm, Patient Position: Sitting, Cuff Size: Normal)   Pulse (!) 47   Ht 5\' 10"  (1.778 m)   Wt 149 lb (67.6 kg)   SpO2 98%   BMI 21.38 kg/m     Wt Readings from Last 3 Encounters:  07/04/23 149 lb (67.6 kg)  06/15/23 150 lb (68 kg)  06/07/23 147 lb 6.4 oz (66.9 kg)     GEN:  Well nourished, well developed in no acute distress HEENT: Normal NECK: No JVD; No carotid bruits CARDIAC: RRR, no murmurs, rubs, gallops RESPIRATORY:  Clear to auscultation without rales, wheezing or rhonchi  ABDOMEN: Soft, non-tender, non-distended MUSCULOSKELETAL:  No edema; No deformity  SKIN: Warm and dry NEUROLOGIC:  Alert and oriented x 3 PSYCHIATRIC:  Normal affect   ASSESSMENT:    1. Coronary artery disease involving native heart without angina pectoris, unspecified vessel or lesion type   2. Hypertension, unspecified type   3. Pure hypercholesterolemia    PLAN:    In order of problems listed above:  Mild nonobstructive CAD (proximal LAD, D1, RCA )  coronary CT 8/23.  EF 55 to 60%.  Continue aspirin 81 mg daily, Crestor 10 mg daily. Hyperlipidemia, continue Crestor 10 mg. Hypertension, BP improved, slightly elevated.  Increase valsartan p.m. dose to 320 mg daily, continue Norvasc 5 mg daily.    Follow-up in 6 months      Medication Adjustments/Labs and Tests Ordered: Current medicines are reviewed at length with the patient today.  Concerns regarding medicines are outlined above.  Orders Placed This Encounter  Procedures   EKG 12-Lead   Meds ordered this encounter  Medications   valsartan (DIOVAN) 320 MG tablet    Sig: Take 0.5 tablets (160 mg total) by mouth daily.    Dispense:  90 tablet    Refill:  1    Patient Instructions  Medication Instructions:  Increase Valsartan to 320 MG daily.   *If you need a refill on your cardiac medications before your next appointment, please call your pharmacy*   Lab Work: None ordered.  If you have labs (blood work) drawn today and your tests are completely normal, you will receive your results only by: MyChart Message (if you have MyChart) OR A paper copy in the mail If you have any lab test that is abnormal or we need to change your treatment, we will call you to review the results.   Testing/Procedures: None ordered.    Follow-Up: At [facility], you and your health needs are our priority.  As part of our continuing mission to provide you with exceptional heart care, we have created designated Provider Care Teams.  These Care Teams include your primary Cardiologist (physician) and Advanced Practice Providers (APPs -  Physician Assistants and Nurse Practitioners) who all work together to provide you with the care you need, when you need it.  We recommend signing up for the patient portal called "MyChart".  Sign up information is provided on this After Visit Summary.  MyChart is used to connect with patients for Virtual Visits (Telemedicine).  Patients are able to view  lab/test results, encounter notes, upcoming appointments, etc.  Non-urgent messages can be sent to your provider as well.   To learn more about what you can do with MyChart, go to [email or web address].    Your next appointment:   6 month(s)  Provider:   You may see Jose Odea, MD or one of the following Advanced Practice Providers on your designated Care Team:   Nicolasa Ducking, NP Eula Listen, PA-C Cadence Fransico Michael, PA-C Charlsie Quest, NP       Signed, Jose Odea, MD  07/04/2023 12:26 PM    [facility]

## 2023-07-04 NOTE — Patient Instructions (Signed)
Medication Instructions:  Increase Valsartan to 320 MG daily.   *If you need a refill on your cardiac medications before your next appointment, please call your pharmacy*   Lab Work: None ordered.  If you have labs (blood work) drawn today and your tests are completely normal, you will receive your results only by: MyChart Message (if you have MyChart) OR A paper copy in the mail If you have any lab test that is abnormal or we need to change your treatment, we will call you to review the results.   Testing/Procedures: None ordered.    Follow-Up: At [facility], you and your health needs are our priority.  As part of our continuing mission to provide you with exceptional heart care, we have created designated Provider Care Teams.  These Care Teams include your primary Cardiologist (physician) and Advanced Practice Providers (APPs -  Physician Assistants and Nurse Practitioners) who all work together to provide you with the care you need, when you need it.  We recommend signing up for the patient portal called "MyChart".  Sign up information is provided on this After Visit Summary.  MyChart is used to connect with patients for Virtual Visits (Telemedicine).  Patients are able to view lab/test results, encounter notes, upcoming appointments, etc.  Non-urgent messages can be sent to your provider as well.   To learn more about what you can do with MyChart, go to [email or web address].    Your next appointment:   6 month(s)  Provider:   You may see Debbe Odea, MD or one of the following Advanced Practice Providers on your designated Care Team:   Nicolasa Ducking, NP Eula Listen, PA-C Cadence Fransico Michael, PA-C Charlsie Quest, NP

## 2023-07-30 ENCOUNTER — Encounter: Payer: Self-pay | Admitting: Pulmonary Disease

## 2023-07-30 ENCOUNTER — Ambulatory Visit (INDEPENDENT_AMBULATORY_CARE_PROVIDER_SITE_OTHER): Payer: Medicare Other | Admitting: Pulmonary Disease

## 2023-07-30 VITALS — BP 128/82 | HR 58 | Temp 97.1°F | Ht 70.0 in | Wt 151.4 lb

## 2023-07-30 DIAGNOSIS — J4550 Severe persistent asthma, uncomplicated: Secondary | ICD-10-CM | POA: Diagnosis not present

## 2023-07-30 DIAGNOSIS — Z9109 Other allergy status, other than to drugs and biological substances: Secondary | ICD-10-CM | POA: Diagnosis not present

## 2023-07-30 LAB — NITRIC OXIDE: Nitric Oxide: 25

## 2023-07-30 NOTE — Patient Instructions (Signed)
VISIT SUMMARY:  During your visit, we discussed your severe asthma and the improvements you've experienced in your respiratory symptoms. You've been using the Breo inhaler once daily and have an emergency inhaler on hand. We also talked about the possibility of using montelukast or a shot for asthma control if your symptoms worsen. You've received your flu shot and reported no other health concerns.  YOUR PLAN:  -SEVERE ASTHMA: Your asthma is a chronic condition that causes inflammation and narrowing in the airways of the lungs. You've been managing it well with your Breo inhaler and an emergency inhaler. We discussed the potential need for additional medication, montelukast, or a shot for asthma control if your symptoms worsen. However, at this time, you're doing well and there's no need for changes in your treatment.  INSTRUCTIONS:  Continue using your Breo inhaler once daily, making sure to rinse your mouth after each use. Use your emergency inhaler as needed. We'll reassess your asthma control in 6-8 weeks, so please make sure to schedule a follow-up appointment.

## 2023-07-30 NOTE — Progress Notes (Signed)
Subjective:    Patient ID: Jose Perry, male    DOB: February 01, 1941, 82 y.o.   MRN: [id number]  Patient Care Team: Dale [place], MD as PCP - General (Internal Medicine) Debbe Odea, MD as PCP - Cardiology (Cardiology) [name], MD as Consulting Physician (Pulmonary Disease)  Chief Complaint  Patient presents with   Follow-up    DOE. No wheezing. Occasional dry cough.   BACKGROUND/INTERVAL:Patient is an 82 year old lifelong never smoker with medical history as noted below, who presents for follow-up of shortness of breath.  I have not seen the patient since 13 September 2022 and that time reiterated to him that he has asthma.  He has asthma with eosinophilic and allergic phenotype.  He has poor understanding of his disease process.  During his last visit here he was instructed on the proper use of his asthma controller he was instructed to continue Breo Ellipta.  In the interim he has seen our nurse practitioner, Micheline Maze on 13 Feb 2023 and 27 March 2023 and Dr. Coralyn Helling on 15 June 2023.  Nitric oxide on 15 June 2023 was 39 ppb.  HPI Discussed the use of AI scribe software for clinical note transcription with the patient, who gave verbal consent to proceed.  History of Present Illness   The patient, with a history of severe asthma, reports improved respiratory symptoms. He has been using the Breo inhaler once or twice daily and rinsing his mouth post-use. He also has an emergency inhaler, which was recently refilled with a different model but contains the same medication. The patient denies taking montelukast, a medication previously considered for his asthma management. He has not experienced a need for it, and it was not officially prescribed. The patient has received his flu shot. He reports no other health concerns at this time. He has had no hemoptysis aside from a minor episode described at the prior visit.     DATA 07/11/2018 PFTs: FEV1 0.77 L or 31%  predicted, FVC 1.52 L or 47% predicted, FEV1/FVC 51%, there was a 40% net change on FEV1 postbronchodilator.  There was air trapping but no hyperinflation noted on lung volumes.  Consistent with severe obstructive airways disease with significant bronchodilator response 05/18/2022 coronary CTA: Lung windows nonrevealing. 05/25/2022 chest x-ray PA and lateral: Hyperinflation otherwise no abnormalities. Review of Systems A 10 point review of systems was performed and it is as noted above otherwise negative. 08/03/2022 nitric oxide: 57 ppb 08/25/2022 alpha 1: Phenotype MM, level 124 08/25/2022 IgE,Allergen panel/eosinophils: IgE 325, multiple allergens identified, no eosinophilia 09/12/2022 PFTs: FEV1 1.28 L or 44% predicted, FVC 1.93 L or 47% predicted, FEV1/FVC 66%, no significant bronchodilator response.  Pseudo restriction may be on the basis of air trapping. 06/06/2023 CBC: 900 eosinophils noted. 06/15/2023 nitirc oxide:39 ppb  Review of Systems A 10 point review of systems was performed and it is as noted above otherwise negative.   Patient Active Problem List   Diagnosis Date Noted   Swelling of lower extremity 04/13/2023   Allergic rhinitis 02/13/2023   Otitis externa 02/13/2023   Diarrhea 10/30/2021   Iron deficiency 05/05/2020   Hyperplasia of prostate with lower urinary tract symptoms (LUTS) 07/10/2018   Exotropia 07/10/2018   Blepharoconjunctivitis 07/10/2018   Primary open angle glaucoma (POAG) 06/28/2018   B12 deficiency 05/14/2018   Liver hemangioma 01/24/2018   Thrombocytopenia (HCC) 12/19/2017   Change in stool 10/12/2017   Asthma 08/30/2017   Urinary urgency 05/23/2017   Thyroid nodule 06/13/2016  Primary open angle glaucoma (POAG) of right eye, moderate stage 04/10/2016   Shortness of breath 08/30/2015   Bruit 06/14/2015   Health care maintenance 12/13/2014   Glaucoma 09/21/2014   Goiter 06/28/2014   Hypertension 06/28/2014   Leukopenia 06/28/2014   History  of colonic polyps 06/28/2014   Enlarged prostate 06/28/2014   GERD (gastroesophageal reflux disease) 06/28/2014   History of adenomatous polyp of colon 06/26/2014   Gastrointestinal ulcer due to Helicobacter pylori 06/26/2014   Male erectile dysfunction, unspecified 07/24/2012   Incomplete emptying of bladder 07/24/2012   Elevated prostate specific antigen (PSA) 07/24/2012   Benign localized hyperplasia of prostate with urinary obstruction 07/24/2012    Social History   Tobacco Use   Smoking status: Never   Smokeless tobacco: Never  Substance Use Topics   Alcohol use: No    Alcohol/week: 0.0 standard drinks of alcohol    Allergies  Allergen Reactions   Brimonidine     Other reaction(s): Eye Redness   Brimonidine Tartrate     Other reaction(s): Eye redness   Brinzolamide-Brimonidine     Other reaction(s): Eye redness   Other Other (See Comments)    Simbrinza Eye Drops gives pt Eye redness    Current Meds  Medication Sig   albuterol (VENTOLIN HFA) 108 (90 Base) MCG/ACT inhaler TAKE 2 PUFFS BY MOUTH EVERY 6 HOURS AS NEEDED   amLODipine (NORVASC) 5 MG tablet Take 5 mg by mouth daily.   ASPIRIN 81 PO Take by mouth.   ferrous sulfate 325 (65 FE) MG EC tablet Take 325 mg by mouth daily.   finasteride (PROSCAR) 5 MG tablet TAKE 1 TABLET (5 MG TOTAL) BY MOUTH DAILY.   fluticasone (FLONASE) 50 MCG/ACT nasal spray Place 2 sprays into both nostrils as needed.   fluticasone furoate-vilanterol (BREO ELLIPTA) 200-25 MCG/ACT AEPB Inhale 1 puff into the lungs daily.   latanoprost (XALATAN) 0.005 % ophthalmic solution ADMINISTER 1 DROP TO THE RIGHT EYE NIGHTLY.   omeprazole (PRILOSEC) 40 MG capsule Take 1 capsule (40 mg total) by mouth daily.   rosuvastatin (CRESTOR) 10 MG tablet TAKE 1 TABLET BY MOUTH EVERY DAY   tamsulosin (FLOMAX) 0.4 MG CAPS capsule TAKE 1 CAPSULE BY MOUTH EVERY DAY   timolol (TIMOPTIC) 0.5 % ophthalmic solution Place 1 drop into the right eye 2 (two) times daily.    valsartan (DIOVAN) 320 MG tablet Take 0.5 tablets (160 mg total) by mouth daily.   vitamin B-12 (CYANOCOBALAMIN) 1000 MCG tablet Take by mouth.    Immunization History  Administered Date(s) Administered   Fluad Quad(high Dose 65+) 08/27/2020, 07/11/2021, 06/16/2022   Influenza, High Dose Seasonal PF 06/13/2016, 08/20/2017, 07/31/2018, 08/22/2019, 06/15/2023   Influenza,inj,Quad PF,6+ Mos 07/20/2015   PFIZER Comirnaty(Gray Top)Covid-19 Tri-Sucrose Vaccine 02/26/2021, 06/28/2022   PFIZER(Purple Top)SARS-COV-2 Vaccination 10/30/2019, 11/20/2019, 07/05/2020   Pfizer Covid-19 Vaccine Bivalent Booster 35yrs & up 07/01/2021   Pfizer(Comirnaty)Fall Seasonal Vaccine 12 years and older 07/04/2023   Pneumococcal Conjugate-13 06/10/2015   Pneumococcal Polysaccharide-23 06/30/2013   Respiratory Syncytial Virus Vaccine,Recomb Aduvanted(Arexvy) 10/07/2022   Tdap 10/25/2021   Zoster Recombinant(Shingrix) 07/06/2018, 08/15/2021   Zoster, Live 06/30/2013        Objective:   BP 128/82 (BP Location: Right Arm, Cuff Size: Normal)   Pulse (!) 58   Temp (!) 97.1 F (36.2 C)   Ht 5\' 10"  (1.778 m)   Wt 151 lb 6.4 oz (68.7 kg)   SpO2 97%   BMI 21.72 kg/m   SpO2: 97 % O2 Device:  None (Room air)  GENERAL: Thin, fit appearing gentleman, looks younger than stated age, fully ambulatory, no conversational dyspnea HEAD: Normocephalic, atraumatic.  EYES: Pupils equal, round, reactive to light.  No scleral icterus.  Strabismus with exotropia of right eye. MOUTH: Dentition intact, oral mucosa moist.  No thrush. NECK: Supple. No thyromegaly. Trachea midline. No JVD.  No adenopathy. PULMONARY: Good air entry bilaterally.  No adventitious sounds. CARDIOVASCULAR: S1 and S2. Regular rate and rhythm.  No rubs, murmurs or gallops heard. ABDOMEN: Benign. MUSCULOSKELETAL: No joint deformity, no clubbing, no edema.  NEUROLOGIC: No overt focal deficit, no gait disturbance, speech is fluent. SKIN:  Intact,warm,dry. PSYCH: Mood and behavior normal.:  Lab Results  Component Value Date   NITRICOXIDE 25 07/30/2023    Assessment & Plan:     ICD-10-CM   1. Severe persistent asthma, unspecified whether complicated  J45.50 Nitric oxide    2. Environmental allergies  Z91.09      Orders Placed This Encounter  Procedures   Nitric oxide   Assessment and Plan    Severe Asthma Improved symptoms and inflammation markers. Currently using Breo once daily and has an emergency inhaler. Discussed the potential need for montelukast or a shot for asthma control if symptoms worsen, but patient is currently doing well. -Continue Breo once daily, rinse mouth after use, avoid LAMA's in setting of glaucoma and BPH. -Use emergency inhaler as needed. -Follow-up in 6-8 weeks to reassess asthma control.  General Health Maintenance Received flu shot. -No additional actions needed at this time.       Gailen Shelter, MD Advanced [facility]ch[facility]    *This note was dictated using voice recognition software/Dragon.  Despite best efforts to proofread, errors can occur which can change the meaning. Any transcriptional errors that result from this process are unintentional and may not be fully corrected at the time of dictation.

## 2023-07-31 ENCOUNTER — Other Ambulatory Visit: Payer: Self-pay | Admitting: Internal Medicine

## 2023-08-01 NOTE — Telephone Encounter (Signed)
Per note review, benicar was changed to diovan.  Should not need rx for benicar.  Please confirm with pt, he is not taking.

## 2023-08-01 NOTE — Telephone Encounter (Signed)
Spoke with pt. Pt confirmed that he is not taking Benicar and taking the Divan instead

## 2023-08-03 ENCOUNTER — Encounter: Payer: Self-pay | Admitting: Urology

## 2023-08-03 ENCOUNTER — Ambulatory Visit (INDEPENDENT_AMBULATORY_CARE_PROVIDER_SITE_OTHER): Payer: Medicare Other | Admitting: Urology

## 2023-08-03 VITALS — BP 154/78 | HR 63 | Ht 70.0 in | Wt 150.0 lb

## 2023-08-03 DIAGNOSIS — N401 Enlarged prostate with lower urinary tract symptoms: Secondary | ICD-10-CM

## 2023-08-03 DIAGNOSIS — N138 Other obstructive and reflux uropathy: Secondary | ICD-10-CM

## 2023-08-03 LAB — BLADDER SCAN AMB NON-IMAGING: PVR: 0 WU

## 2023-08-03 MED ORDER — TAMSULOSIN HCL 0.4 MG PO CAPS: 0.4000 mg | ORAL_CAPSULE | Freq: Every day | ORAL | 3 refills | Status: DC

## 2023-08-03 MED ORDER — FINASTERIDE 5 MG PO TABS: 5.0000 mg | ORAL_TABLET | Freq: Every day | ORAL | 3 refills | Status: DC

## 2023-08-03 NOTE — Progress Notes (Signed)
I, Maysun Anabel Bene, acting as a scribe for Riki Altes, MD., have documented all relevant documentation on the behalf of Riki Altes, MD, as directed by Riki Altes, MD while in the presence of Riki Altes, MD.  08/03/2023 12:55 PM   [name] 1941-05-28 [id number]  Referring provider: Dale [place], MD [address] [place],  [place] [address]  Chief Complaint  Patient presents with   Benign Prostatic Hypertrophy   Urologic history: 1.  BPH with lower urinary tract symptoms Tamsulosin daily Finasteride added October 2023 due to increasing PVR 200 mL.  HPI: ATLAS SHIHADEH is a 82 y.o. male presents for annual follow-up.  Doing well since last visit No bothersome LUTS; he does have urinary frequency and urgency, but attributes this to staying hydrated and drinking water throughout the day.  Remains on tamsulosin and finasteride Denies dysuria, gross hematuria Denies flank, abdominal or pelvic pain   PMH: Past Medical History:  Diagnosis Date   Asthma    Dyspnea    Elevated blood pressure    Enlarged prostate    GERD (gastroesophageal reflux disease)    Glaucoma    History of adenomatous polyp of colon    Hx of colonic polyps    Hypertension    Leukopenia    has had an extensive w/up.     Liver hemangioma 01/24/2018   Thyroid goiter     Surgical History: Past Surgical History:  Procedure Laterality Date   BACK SURGERY  2002   ruptured disc   CATARACT EXTRACTION W/PHACO Right 06/30/2020   Procedure: CATARACT EXTRACTION PHACO AND INTRAOCULAR LENS PLACEMENT (IOC) RIGHT, Malyugin 7.19 01:30.8 7.9%;  Surgeon: Lockie Mola, MD;  Location: [facility];  Service: Ophthalmology;  Laterality: Right;   COLONOSCOPY W/ POLYPECTOMY  04/24/2005, 07/06/2010, 07/08/2014   COLONOSCOPY WITH PROPOFOL N/A 02/01/2018   Procedure: COLONOSCOPY WITH PROPOFOL;  Surgeon: Scot Jun, MD;  Location: [facility];  Service:  Endoscopy;  Laterality: N/A;   ESOPHAGOGASTRODUODENOSCOPY  04/24/2005, 07/06/2010,   ESOPHAGOGASTRODUODENOSCOPY (EGD) WITH PROPOFOL N/A 05/10/2022   Procedure: ESOPHAGOGASTRODUODENOSCOPY (EGD) WITH PROPOFOL;  Surgeon: Toledo, Boykin Nearing, MD;  Location: [facility] ENDOSCOPY;  Service: Gastroenterology;  Laterality: N/A;   EYE SURGERY  2009   relieve pressure from glaucoma   THYROID SURGERY  1968   goiter    Home Medications:  Allergies as of 08/03/2023       Reactions   Brimonidine    Other reaction(s): Eye Redness   Brimonidine Tartrate    Other reaction(s): Eye redness   Brinzolamide-brimonidine    Other reaction(s): Eye redness   Other Other (See Comments)   Simbrinza Eye Drops gives pt Eye redness        Medication List        Accurate as of August 03, 2023 12:55 PM. If you have any questions, ask your nurse or doctor.          albuterol 108 (90 Base) MCG/ACT inhaler Commonly known as: VENTOLIN HFA TAKE 2 PUFFS BY MOUTH EVERY 6 HOURS AS NEEDED   amLODipine 5 MG tablet Commonly known as: NORVASC Take 5 mg by mouth daily.   ASPIRIN 81 PO Take by mouth.   cyanocobalamin 1000 MCG tablet Commonly known as: VITAMIN B12 Take by mouth.   ferrous sulfate 325 (65 FE) MG EC tablet Take 325 mg by mouth daily.   finasteride 5 MG tablet Commonly known as: PROSCAR Take 1 tablet (5 mg  total) by mouth daily.   fluticasone 50 MCG/ACT nasal spray Commonly known as: FLONASE Place 2 sprays into both nostrils as needed.   fluticasone furoate-vilanterol 200-25 MCG/ACT Aepb Commonly known as: Breo Ellipta Inhale 1 puff into the lungs daily.   latanoprost 0.005 % ophthalmic solution Commonly known as: XALATAN ADMINISTER 1 DROP TO THE RIGHT EYE NIGHTLY.   montelukast 10 MG tablet Commonly known as: SINGULAIR TAKE 1 TABLET BY MOUTH EVERYDAY AT BEDTIME   omeprazole 40 MG capsule Commonly known as: PRILOSEC Take 1 capsule (40 mg total) by mouth daily.   rosuvastatin 10 MG  tablet Commonly known as: CRESTOR TAKE 1 TABLET BY MOUTH EVERY DAY   tamsulosin 0.4 MG Caps capsule Commonly known as: FLOMAX Take 1 capsule (0.4 mg total) by mouth daily.   timolol 0.5 % ophthalmic solution Commonly known as: TIMOPTIC Place 1 drop into the right eye 2 (two) times daily.   valsartan 320 MG tablet Commonly known as: DIOVAN Take 0.5 tablets (160 mg total) by mouth daily.        Allergies:  Allergies  Allergen Reactions   Brimonidine     Other reaction(s): Eye Redness   Brimonidine Tartrate     Other reaction(s): Eye redness   Brinzolamide-Brimonidine     Other reaction(s): Eye redness   Other Other (See Comments)    Simbrinza Eye Drops gives pt Eye redness    Family History: Family History  Problem Relation Age of Onset   Heart disease Mother    Alcohol abuse Father    Hyperlipidemia Father    Stroke Father     Social History:  reports that he has never smoked. He has never used smokeless tobacco. He reports that he does not drink alcohol and does not use drugs.   Physical Exam: BP (!) 154/78   Pulse 63   Ht 5\' 10"  (1.778 m)   Wt 150 lb (68 kg)   BMI 21.52 kg/m   Constitutional:  Alert, No acute distress. HEENT: [place] AT Respiratory: Normal respiratory effort, no increased work of breathing. Psychiatric: Normal mood and affect.   Assessment & Plan:    1. BPH with lower urinary tract symptoms Stable LUTS on tamsulosin/finasteride PVR today 0 mL  Tamsulosin and finasteride refilled 1 year follow-up with PVR.   I have reviewed the above documentation for accuracy and completeness, and I agree with the above.   Riki Altes, MD  [facility] [address], [address] [place], [place] [address] [phone]

## 2023-08-05 ENCOUNTER — Telehealth: Payer: Self-pay | Admitting: Internal Medicine

## 2023-08-05 NOTE — Telephone Encounter (Signed)
Opened in error

## 2023-08-06 ENCOUNTER — Ambulatory Visit
Admission: EM | Admit: 2023-08-06 | Discharge: 2023-08-06 | Disposition: A | Discharge status: Home / Self Care | Payer: Medicare Other | Attending: Emergency Medicine | Admitting: Emergency Medicine

## 2023-08-06 ENCOUNTER — Telehealth: Payer: Self-pay | Admitting: Internal Medicine

## 2023-08-06 DIAGNOSIS — I10 Essential (primary) hypertension: Secondary | ICD-10-CM

## 2023-08-06 NOTE — Telephone Encounter (Signed)
Patient called and said he just walked out of Urgent Care. He was told to see his provider as soon as possible. Appointment was made for 08/08/2023 at 8am for patient.

## 2023-08-06 NOTE — Discharge Instructions (Addendum)
Your blood pressure is elevated today at 174/94; repeat 157/86.  Please have this rechecked by your primary care provider this week.

## 2023-08-06 NOTE — Telephone Encounter (Signed)
Noted. Agree with evaluation and f/u.

## 2023-08-06 NOTE — Telephone Encounter (Signed)
Patient called and stated his BP has been running high for the pass 2 days, 165/95. No other symptoms. Patient transferred to Access Nurse.

## 2023-08-06 NOTE — Telephone Encounter (Signed)
FYI- Access nurse recommended evaluation within 4 hours. Pt has already arrived at urgent care. Will follow up after evaluated.

## 2023-08-06 NOTE — ED Provider Notes (Signed)
Jose Perry    CSN: [id number] Arrival date & time: 08/06/23  1301      History   Chief Complaint Chief Complaint  Patient presents with   Hypertension    HPI Jose Perry is a 82 y.o. male.  Patient presents with elevated blood pressure readings at home over the last 3 days.  He states his blood pressure was 180/100 at home this morning.  He reports he is asymptomatic.  He denies focal weakness, numbness, headache, dizziness, chest pain, shortness of breath, or other symptoms.  His medical history includes hypertension.  Patient was seen by his PCP on 06/07/2023 for medical management of chronic issues including hypertension.  He was seen by his cardiologist on 07/04/2023 for 49-month follow-up of blood pressure medication changes; his valsartan dose was increased.  The history is provided by the patient and medical records.    Past Medical History:  Diagnosis Date   Asthma    Dyspnea    Elevated blood pressure    Enlarged prostate    GERD (gastroesophageal reflux disease)    Glaucoma    History of adenomatous polyp of colon    Hx of colonic polyps    Hypertension    Leukopenia    has had an extensive w/up.     Liver hemangioma 01/24/2018   Thyroid goiter     Patient Active Problem List   Diagnosis Date Noted   Swelling of lower extremity 04/13/2023   Allergic rhinitis 02/13/2023   Otitis externa 02/13/2023   Diarrhea 10/30/2021   Iron deficiency 05/05/2020   Hyperplasia of prostate with lower urinary tract symptoms (LUTS) 07/10/2018   Exotropia 07/10/2018   Blepharoconjunctivitis 07/10/2018   Primary open angle glaucoma (POAG) 06/28/2018   B12 deficiency 05/14/2018   Liver hemangioma 01/24/2018   Thrombocytopenia (HCC) 12/19/2017   Change in stool 10/12/2017   Asthma 08/30/2017   Urinary urgency 05/23/2017   Thyroid nodule 06/13/2016   Primary open angle glaucoma (POAG) of right eye, moderate stage 04/10/2016   Shortness of breath 08/30/2015    Bruit 06/14/2015   Health care maintenance 12/13/2014   Glaucoma 09/21/2014   Goiter 06/28/2014   Hypertension 06/28/2014   Leukopenia 06/28/2014   History of colonic polyps 06/28/2014   Enlarged prostate 06/28/2014   GERD (gastroesophageal reflux disease) 06/28/2014   History of adenomatous polyp of colon 06/26/2014   Gastrointestinal ulcer due to Helicobacter pylori 06/26/2014   Male erectile dysfunction, unspecified 07/24/2012   Incomplete emptying of bladder 07/24/2012   Elevated prostate specific antigen (PSA) 07/24/2012   Benign localized hyperplasia of prostate with urinary obstruction 07/24/2012    Past Surgical History:  Procedure Laterality Date   BACK SURGERY  2002   ruptured disc   CATARACT EXTRACTION W/PHACO Right 06/30/2020   Procedure: CATARACT EXTRACTION PHACO AND INTRAOCULAR LENS PLACEMENT (IOC) RIGHT, Malyugin 7.19 01:30.8 7.9%;  Surgeon: Lockie Mola, MD;  Location: [facility];  Service: Ophthalmology;  Laterality: Right;   COLONOSCOPY W/ POLYPECTOMY  04/24/2005, 07/06/2010, 07/08/2014   COLONOSCOPY WITH PROPOFOL N/A 02/01/2018   Procedure: COLONOSCOPY WITH PROPOFOL;  Surgeon: Scot Jun, MD;  Location: [facility];  Service: Endoscopy;  Laterality: N/A;   ESOPHAGOGASTRODUODENOSCOPY  04/24/2005, 07/06/2010,   ESOPHAGOGASTRODUODENOSCOPY (EGD) WITH PROPOFOL N/A 05/10/2022   Procedure: ESOPHAGOGASTRODUODENOSCOPY (EGD) WITH PROPOFOL;  Surgeon: Toledo, Boykin Nearing, MD;  Location: [facility];  Service: Gastroenterology;  Laterality: N/A;   EYE SURGERY  2009   relieve pressure from glaucoma   THYROID SURGERY  1968   goiter       Home Medications    Prior to Admission medications   Medication Sig Start Date End Date Taking? Authorizing Provider  albuterol (VENTOLIN HFA) 108 (90 Base) MCG/ACT inhaler TAKE 2 PUFFS BY MOUTH EVERY 6 HOURS AS NEEDED 05/24/23   Cobb, Ruby Cola, NP  amLODipine (NORVASC) 5 MG tablet Take 5 mg by mouth daily.     [provider]  ASPIRIN 81 PO Take by mouth.    [provider]  ferrous sulfate 325 (65 FE) MG EC tablet Take 325 mg by mouth daily.    [provider]  finasteride (PROSCAR) 5 MG tablet Take 1 tablet (5 mg total) by mouth daily. 08/03/23   Stoioff, Verna Czech, MD  fluticasone (FLONASE) 50 MCG/ACT nasal spray Place 2 sprays into both nostrils as needed. 03/23/22   [provider]  fluticasone furoate-vilanterol (BREO ELLIPTA) 200-25 MCG/ACT AEPB Inhale 1 puff into the lungs daily. 09/13/22   [name], MD  latanoprost (XALATAN) 0.005 % ophthalmic solution ADMINISTER 1 DROP TO THE RIGHT EYE NIGHTLY. 03/07/19   [provider]  montelukast (SINGULAIR) 10 MG tablet TAKE 1 TABLET BY MOUTH EVERYDAY AT BEDTIME 05/15/23   Cobb, Ruby Cola, NP  omeprazole (PRILOSEC) 40 MG capsule Take 1 capsule (40 mg total) by mouth daily. 04/11/23   Dale Cromberg, MD  rosuvastatin (CRESTOR) 10 MG tablet TAKE 1 TABLET BY MOUTH EVERY DAY 03/01/23   Dale [place], MD  tamsulosin (FLOMAX) 0.4 MG CAPS capsule Take 1 capsule (0.4 mg total) by mouth daily. 08/03/23   Stoioff, Verna Czech, MD  timolol (TIMOPTIC) 0.5 % ophthalmic solution Place 1 drop into the right eye 2 (two) times daily. 03/09/22   [provider]  valsartan (DIOVAN) 320 MG tablet Take 0.5 tablets (160 mg total) by mouth daily. 07/04/23   Debbe Odea, MD  vitamin B-12 (CYANOCOBALAMIN) 1000 MCG tablet Take by mouth.    [provider]    Family History Family History  Problem Relation Age of Onset   Heart disease Mother    Alcohol abuse Father    Hyperlipidemia Father    Stroke Father     Social History Social History   Tobacco Use   Smoking status: Never   Smokeless tobacco: Never  Vaping Use   Vaping status: Never Used  Substance Use Topics   Alcohol use: No    Alcohol/week: 0.0 standard drinks of alcohol   Drug use: No     Allergies   Brimonidine, Brimonidine tartrate,  Brinzolamide-brimonidine, and Other   Review of Systems Review of Systems  Respiratory:  Negative for cough and shortness of breath.   Cardiovascular:  Negative for chest pain and palpitations.  Neurological:  Negative for dizziness, syncope, weakness, numbness and headaches.     Physical Exam Triage Vital Signs ED Triage Vitals  Encounter Vitals Group     BP 08/06/23 1322 (!) 174/94     Systolic BP Percentile --      Diastolic BP Percentile --      Pulse Rate 08/06/23 1322 (!) 56     Resp 08/06/23 1322 18     Temp 08/06/23 1322 97.9 F (36.6 C)     Temp src --      SpO2 08/06/23 1322 98 %     Weight --      Height --      Head Circumference --      [place] Flow --  Pain Score 08/06/23 1318 0     Pain Loc --      Pain Education --      Exclude from Growth Chart --    No data found.  Updated Vital Signs BP (!) 157/86   Pulse (!) 56   Temp 97.9 F (36.6 C)   Resp 18   SpO2 98%   Visual Acuity Right Eye Distance:   Left Eye Distance:   Bilateral Distance:    Right Eye Near:   Left Eye Near:    Bilateral Near:     Physical Exam Constitutional:      General: He is not in acute distress. Cardiovascular:     Rate and Rhythm: Regular rhythm. Bradycardia present.     Heart sounds: Normal heart sounds.  Musculoskeletal:     Right lower leg: No edema.     Left lower leg: No edema.  Neurological:     General: No focal deficit present.     Mental Status: He is alert and oriented to person, place, and time.     Sensory: No sensory deficit.     Motor: No weakness.     Gait: Gait normal.      UC Treatments / Results  Labs (all labs ordered are listed, but only abnormal results are displayed) Labs Reviewed - No data to display  EKG   Radiology No results found.  Procedures Procedures (including critical care time)  Medications Ordered in UC Medications - No data to display  Initial Impression / Assessment and Plan / UC Course  I have reviewed  the triage vital signs and the nursing notes.  Pertinent labs & imaging results that were available during my care of the patient were reviewed by me and considered in my medical decision making (see chart for details).   Elevated blood pressure reading with hypertension.  Patient is asymptomatic.  EKG shows sinus bradycardia, rate 49, no ST elevation, compared to previous from 07/04/2023.  His blood pressure was 174/94 on arrival and the repeat was 157/86.  His blood pressure medications have been changed recently by his cardiologist and PCP.  Instructed him to follow-up with his PCP or cardiologist this week for recheck of his blood pressure.  ED precautions discussed.  Education provided on managing hypertension.  He agrees to plan of care.  Final Clinical Impressions(s) / UC Diagnoses   Final diagnoses:  Elevated blood pressure reading in office with diagnosis of hypertension     Discharge Instructions      Your blood pressure is elevated today at 174/94; repeat 157/86.  Please have this rechecked by your primary care provider this week.          ED Prescriptions   None    PDMP not reviewed this encounter.   Mickie Bail, NP 08/06/23 [phone]

## 2023-08-06 NOTE — Telephone Encounter (Signed)
Called patient to follow up. He is feeling ok- just elevated readings. He was evaluated and scheduled for an ED follow up with Dr Lorin Picket this week.

## 2023-08-06 NOTE — ED Triage Notes (Signed)
Patient to Urgent Care with complaints of elevated blood pressure. Reports symptoms started 3 days ago. Last BP PTA was 180/101. Denies any current symptoms.   History of HTN- currently taking valsartan 220mg  and Amlodipine 5mg . (Dose of amlodipine 2hrs PTA).

## 2023-08-09 ENCOUNTER — Encounter: Payer: Self-pay | Admitting: Internal Medicine

## 2023-08-09 ENCOUNTER — Ambulatory Visit (INDEPENDENT_AMBULATORY_CARE_PROVIDER_SITE_OTHER): Payer: Medicare Other | Admitting: Internal Medicine

## 2023-08-09 VITALS — BP 148/88 | HR 60 | Temp 98.0°F | Resp 16 | Ht 70.0 in | Wt 150.6 lb

## 2023-08-09 DIAGNOSIS — I10 Essential (primary) hypertension: Secondary | ICD-10-CM

## 2023-08-09 DIAGNOSIS — J4520 Mild intermittent asthma, uncomplicated: Secondary | ICD-10-CM | POA: Diagnosis not present

## 2023-08-09 MED ORDER — AMLODIPINE BESYLATE 2.5 MG PO TABS: ORAL_TABLET | ORAL | 2 refills | Status: DC

## 2023-08-09 NOTE — Progress Notes (Addendum)
Subjective:    Patient ID: Jose Perry, male    DOB: 06-10-1941, 82 y.o.   MRN: [id number]  Patient here for  Chief Complaint  Patient presents with   Hypertension    HPI Here as a work in appt - f/u urgent care visit. Evaluated urgent care 08/06/23 - blood pressures elevated - reported home readings of 180/100. Currently on amlodipine 5mg  q day and valsartan 320mg  q day.  Since urgent care, he has been monitoring his blood pressure.  Reviewed outside readings.  Varying - some pm readings pm readings 120-130/70s.  Persistent elevation 160s/70-90. No chest pain.  No increased cough or congestion reported. Had f/u with urology 08/03/23 - continue tamsulosin and finasteride. F/u with pulmonary 07/30/23 - f/u severe asthma.  Improved symptoms - using breo.  Discussed increased salt intake.  Has been eating more salt recently.    Past Medical History:  Diagnosis Date   Asthma    Dyspnea    Elevated blood pressure    Enlarged prostate    GERD (gastroesophageal reflux disease)    Glaucoma    History of adenomatous polyp of colon    Hx of colonic polyps    Hypertension    Leukopenia    has had an extensive w/up.     Liver hemangioma 01/24/2018   Thyroid goiter    Past Surgical History:  Procedure Laterality Date   BACK SURGERY  2002   ruptured disc   CATARACT EXTRACTION W/PHACO Right 06/30/2020   Procedure: CATARACT EXTRACTION PHACO AND INTRAOCULAR LENS PLACEMENT (IOC) RIGHT, M[place]ugin 7.19 01:30.8 7.9%;  Surgeon: Lockie Mola, MD;  Location: [facility];  Service: Ophthalmology;  Laterality: Right;   COLONOSCOPY W/ POLYPECTOMY  04/24/2005, 07/06/2010, 07/08/2014   COLONOSCOPY WITH PROPOFOL N/A 02/01/2018   Procedure: COLONOSCOPY WITH PROPOFOL;  Surgeon: Scot Jun, MD;  Location: [facility];  Service: Endoscopy;  Laterality: N/A;   ESOPHAGOGASTRODUODENOSCOPY  04/24/2005, 07/06/2010,   ESOPHAGOGASTRODUODENOSCOPY (EGD) WITH PROPOFOL N/A 05/10/2022   Procedure:  ESOPHAGOGASTRODUODENOSCOPY (EGD) WITH PROPOFOL;  Surgeon: Toledo, Boykin Nearing, MD;  Location: [facility] ENDOSCOPY;  Service: Gastroenterology;  Laterality: N/A;   EYE SURGERY  2009   relieve pressure from glaucoma   THYROID SURGERY  1968   goiter   Family History  Problem Relation Age of Onset   Heart disease Mother    Alcohol abuse Father    Hyperlipidemia Father    Stroke Father    Social History   Socioeconomic History   Marital status: Married    Spouse name: Not on file   Number of children: Not on file   Years of education: Not on file   Highest education level: Some college, no degree  Occupational History   Not on file  Tobacco Use   Smoking status: Never   Smokeless tobacco: Never  Vaping Use   Vaping status: Never Used  Substance and Sexual Activity   Alcohol use: No    Alcohol/week: 0.0 standard drinks of alcohol   Drug use: No   Sexual activity: Yes  Other Topics Concern   Not on file  Social History Narrative   Not on file   Social Determinants of Health   Financial Resource Strain: Low Risk  (08/07/2023)   Overall Financial Resource Strain (CARDIA)    Difficulty of Paying Living Expenses: Not hard at all  Food Insecurity: No Food Insecurity (08/07/2023)   Hunger Vital Sign    Worried About Running Out of Food in the Last  Year: Never true    Ran Out of Food in the Last Year: Never true  Transportation Needs: No Transportation Needs (08/07/2023)   PRAPARE - Administrator, Civil Service (Medical): No    Lack of Transportation (Non-Medical): No  Physical Activity: Sufficiently Active (08/07/2023)   Exercise Vital Sign    Days of Exercise per Week: 7 days    Minutes of Exercise per Session: 70 min  Stress: No Stress Concern Present (08/07/2023)   [employer] of Occupational Health - Occupational Stress Questionnaire    Feeling of Stress : Not at all  Social Connections: Unknown (08/07/2023)   Social Connection and Isolation Panel [NHANES]     Frequency of Communication with Friends and Family: Once a week    Frequency of Social Gatherings with Friends and Family: Patient declined    Attends Religious Services: Never    Database administrator or Organizations: No    Attends Engineer, structural: Not on file    Marital Status: Married     Review of Systems  Constitutional:  Negative for appetite change and unexpected weight change.  HENT:  Negative for congestion and sinus pressure.   Respiratory:  Negative for cough, chest tightness and shortness of breath.   Cardiovascular:  Negative for chest pain and palpitations.       No increased swelling.   Gastrointestinal:  Negative for abdominal pain, diarrhea, nausea and vomiting.  Genitourinary:  Negative for difficulty urinating and dysuria.  Musculoskeletal:  Negative for joint swelling and myalgias.  Skin:  Negative for color change and rash.  Neurological:  Negative for dizziness and headaches.  Psychiatric/Behavioral:  Negative for agitation and dysphoric mood.        Objective:     BP (!) 148/88   Pulse 60   Temp 98 F (36.7 C)   Resp 16   Ht 5\' 10"  (1.778 m)   Wt 150 lb 9.6 oz (68.3 kg)   SpO2 98%   BMI 21.61 kg/m  Wt Readings from Last 3 Encounters:  08/09/23 150 lb 9.6 oz (68.3 kg)  08/03/23 150 lb (68 kg)  07/30/23 151 lb 6.4 oz (68.7 kg)    Physical Exam Vitals reviewed.  Constitutional:      General: He is not in acute distress.    Appearance: Normal appearance. He is well-developed.  HENT:     Head: Normocephalic and atraumatic.     Right Ear: External ear normal.     Left Ear: External ear normal.  Eyes:     General: No scleral icterus.       Right eye: No discharge.        Left eye: No discharge.     Conjunctiva/sclera: Conjunctivae normal.  Cardiovascular:     Rate and Rhythm: Normal rate and regular rhythm.  Pulmonary:     Effort: Pulmonary effort is normal. No respiratory distress.     Breath sounds: Normal breath  sounds.  Abdominal:     General: Bowel sounds are normal.     Palpations: Abdomen is soft.     Tenderness: There is no abdominal tenderness.  Musculoskeletal:        General: No tenderness.     Cervical back: Neck supple. No tenderness.     Comments: No increased swelling.  Wearing support socks.   Lymphadenopathy:     Cervical: No cervical adenopathy.  Skin:    Findings: No erythema or rash.  Neurological:  Mental Status: He is alert.  Psychiatric:        Mood and Affect: Mood normal.        Behavior: Behavior normal.      Outpatient Encounter Medications as of 08/09/2023  Medication Sig   amLODipine (NORVASC) 2.5 MG tablet One tablet bid   albuterol (VENTOLIN HFA) 108 (90 Base) MCG/ACT inhaler TAKE 2 PUFFS BY MOUTH EVERY 6 HOURS AS NEEDED   ASPIRIN 81 PO Take by mouth.   ferrous sulfate 325 (65 FE) MG EC tablet Take 325 mg by mouth daily.   finasteride (PROSCAR) 5 MG tablet Take 1 tablet (5 mg total) by mouth daily.   fluticasone (FLONASE) 50 MCG/ACT nasal spray Place 2 sprays into both nostrils as needed.   fluticasone furoate-vilanterol (BREO ELLIPTA) 200-25 MCG/ACT AEPB Inhale 1 puff into the lungs daily.   latanoprost (XALATAN) 0.005 % ophthalmic solution ADMINISTER 1 DROP TO THE RIGHT EYE NIGHTLY.   montelukast (SINGULAIR) 10 MG tablet TAKE 1 TABLET BY MOUTH EVERYDAY AT BEDTIME   omeprazole (PRILOSEC) 40 MG capsule Take 1 capsule (40 mg total) by mouth daily.   rosuvastatin (CRESTOR) 10 MG tablet TAKE 1 TABLET BY MOUTH EVERY DAY   tamsulosin (FLOMAX) 0.4 MG CAPS capsule Take 1 capsule (0.4 mg total) by mouth daily.   timolol (TIMOPTIC) 0.5 % ophthalmic solution Place 1 drop into the right eye 2 (two) times daily.   valsartan (DIOVAN) 320 MG tablet Take 0.5 tablets (160 mg total) by mouth daily.   vitamin B-12 (CYANOCOBALAMIN) 1000 MCG tablet Take by mouth.   [DISCONTINUED] amLODipine (NORVASC) 5 MG tablet Take 5 mg by mouth daily.   No facility-administered  encounter medications on file as of 08/09/2023.     Lab Results  Component Value Date   WBC 3.1 (L) 06/06/2023   HGB 13.1 06/06/2023   HCT 41.3 06/06/2023   PLT 134.0 (L) 06/06/2023   GLUCOSE 82 06/06/2023   CHOL 146 06/06/2023   TRIG 35.0 06/06/2023   HDL 74.50 06/06/2023   LDLCALC 65 06/06/2023   ALT 20 06/06/2023   AST 27 06/06/2023   NA 140 06/06/2023   K 3.9 06/06/2023   CL 102 06/06/2023   CREATININE 1.21 06/06/2023   BUN 14 06/06/2023   CO2 32 06/06/2023   TSH 1.21 06/16/2022   PSA 2.86 07/11/2021    No results found.     Assessment & Plan:  Primary hypertension Assessment & Plan: Currently on diovan 320mg  q day.  Taking amlodipine 5mg  q day.  Had swelling with increased dose of amlodipine.  Discussed increased salt intake.  Will decrease sodium intake. Wants to try changing amlodipine to 2.5mg  bid - given variation of blood pressure throughout the day.  Follow pressures.  Send in readings.    Mild intermittent asthma, unspecified whether complicated Assessment & Plan: Using breo regularly.  Breathing overall stable.  No increased cough.  No significant acid reflux reported.  Follow.     Other orders -     amLODIPine Besylate; One tablet bid  Dispense: 60 tablet; Refill: 2     Dale [place], MD

## 2023-08-09 NOTE — Assessment & Plan Note (Signed)
Currently on diovan 320mg  q day.  Taking amlodipine 5mg  q day.  Had swelling with increased dose of amlodipine.  Discussed increased salt intake.  Will decrease sodium intake. Wants to try changing amlodipine to 2.5mg  bid - given variation of blood pressure throughout the day.  Follow pressures.  Send in readings.

## 2023-08-09 NOTE — Assessment & Plan Note (Signed)
Using breo regularly.  Breathing overall stable.  No increased cough.  No significant acid reflux reported.  Follow.

## 2023-08-09 NOTE — Patient Instructions (Signed)
Stop 5mg  amlodipine.   Start 2.5 mg amlodipine twice a day.    Continue valsartan (diovan) 320mg  - one per day.

## 2023-08-19 ENCOUNTER — Other Ambulatory Visit: Payer: Self-pay | Admitting: Internal Medicine

## 2023-08-27 DIAGNOSIS — J301 Allergic rhinitis due to pollen: Secondary | ICD-10-CM | POA: Diagnosis not present

## 2023-08-27 DIAGNOSIS — H6123 Impacted cerumen, bilateral: Secondary | ICD-10-CM | POA: Diagnosis not present

## 2023-09-11 ENCOUNTER — Other Ambulatory Visit (INDEPENDENT_AMBULATORY_CARE_PROVIDER_SITE_OTHER): Payer: Medicare Other

## 2023-09-11 DIAGNOSIS — E785 Hyperlipidemia, unspecified: Secondary | ICD-10-CM

## 2023-09-11 DIAGNOSIS — I10 Essential (primary) hypertension: Secondary | ICD-10-CM

## 2023-09-11 DIAGNOSIS — D696 Thrombocytopenia, unspecified: Secondary | ICD-10-CM

## 2023-09-11 DIAGNOSIS — E041 Nontoxic single thyroid nodule: Secondary | ICD-10-CM | POA: Diagnosis not present

## 2023-09-11 LAB — HEPATIC FUNCTION PANEL
ALT: 22 U/L (ref 0–53)
AST: 32 U/L (ref 0–37)
Albumin: 4.3 g/dL (ref 3.5–5.2)
Alkaline Phosphatase: 77 U/L (ref 39–117)
Bilirubin, Direct: 0.2 mg/dL (ref 0.0–0.3)
Total Bilirubin: 0.8 mg/dL (ref 0.2–1.2)
Total Protein: 6.8 g/dL (ref 6.0–8.3)

## 2023-09-11 LAB — LIPID PANEL
Cholesterol: 170 mg/dL (ref 0–200)
HDL: 77 mg/dL (ref >39.00)
LDL Cholesterol: 85 mg/dL (ref 0–99)
NonHDL: 93.18
Total CHOL/HDL Ratio: 2
Triglycerides: 42 mg/dL (ref 0.0–149.0)
VLDL: 8.4 mg/dL (ref 0.0–40.0)

## 2023-09-11 LAB — CBC WITH DIFFERENTIAL/PLATELET
Basophils Absolute: 0 10*3/uL (ref 0.0–0.1)
Basophils Relative: 0.9 % (ref 0.0–3.0)
Eosinophils Absolute: 0.2 10*3/uL (ref 0.0–0.7)
Eosinophils Relative: 5.6 % — ABNORMAL HIGH (ref 0.0–5.0)
HCT: 41.3 % (ref 39.0–52.0)
Hemoglobin: 13.4 g/dL (ref 13.0–17.0)
Lymphocytes Relative: 36 % (ref 12.0–46.0)
Lymphs Abs: 1.1 10*3/uL (ref 0.7–4.0)
MCHC: 32.5 g/dL (ref 30.0–36.0)
MCV: 86.1 fL (ref 78.0–100.0)
Monocytes Absolute: 0.3 10*3/uL (ref 0.1–1.0)
Monocytes Relative: 10.8 % (ref 3.0–12.0)
Neutro Abs: 1.4 10*3/uL (ref 1.4–7.7)
Neutrophils Relative %: 46.7 % (ref 43.0–77.0)
Platelets: 140 10*3/uL — ABNORMAL LOW (ref 150.0–400.0)
RBC: 4.8 Mil/uL (ref 4.22–5.81)
RDW: 14.9 % (ref 11.5–15.5)
WBC: 2.9 10*3/uL — ABNORMAL LOW (ref 4.0–10.5)

## 2023-09-11 LAB — BASIC METABOLIC PANEL
BUN: 14 mg/dL (ref 6–23)
CO2: 31 meq/L (ref 19–32)
Calcium: 10.3 mg/dL (ref 8.4–10.5)
Chloride: 101 meq/L (ref 96–112)
Creatinine, Ser: 1.29 mg/dL (ref 0.40–1.50)
GFR: 51.96 mL/min — ABNORMAL LOW (ref >60.00)
Glucose, Bld: 89 mg/dL (ref 70–99)
Potassium: 4.3 meq/L (ref 3.5–5.1)
Sodium: 137 meq/L (ref 135–145)

## 2023-09-11 LAB — TSH: TSH: 1.83 u[IU]/mL (ref 0.35–5.50)

## 2023-09-12 ENCOUNTER — Other Ambulatory Visit: Payer: Self-pay | Admitting: Pulmonary Disease

## 2023-09-13 ENCOUNTER — Ambulatory Visit: Payer: Medicare Other | Admitting: Pulmonary Disease

## 2023-09-13 ENCOUNTER — Encounter: Payer: Medicare Other | Admitting: Internal Medicine

## 2023-09-13 ENCOUNTER — Encounter: Payer: Self-pay | Admitting: Pulmonary Disease

## 2023-09-13 VITALS — BP 142/82 | HR 59 | Temp 97.1°F | Ht 70.0 in | Wt 151.4 lb

## 2023-09-13 DIAGNOSIS — J4550 Severe persistent asthma, uncomplicated: Secondary | ICD-10-CM

## 2023-09-13 DIAGNOSIS — Z9109 Other allergy status, other than to drugs and biological substances: Secondary | ICD-10-CM

## 2023-09-13 LAB — NITRIC OXIDE: Nitric Oxide: 30

## 2023-09-13 NOTE — Progress Notes (Signed)
Subjective:    Patient ID: Jose Perry, male    DOB: [birth year], 82 y.o.   MRN: [id number]  Patient Care Team: Dale [place], MD as PCP - General (Internal Medicine) Debbe Odea, MD as PCP - Cardiology (Cardiology) [name], MD as Consulting Physician (Pulmonary Disease)  Chief Complaint  Patient presents with   Follow-up    BACKGROUND/INTERVAL:Patient is an 82 year old lifelong never smoker with medical history as noted below, who presents for follow-up of shortness of breath.  Patient was last seen on 30 July 2023.  He has asthma with eosinophilic and allergic phenotype.  He has poor understanding of his disease process. Nitric oxide on 15 June 2023 was 39 ppb.   HPI Discussed the use of AI scribe software for clinical note transcription with the patient, who gave verbal consent to proceed.  History of Present Illness   The patient, an 82 year old lifelong non-smoker with a diagnosis of asthma, reports a general state of well-being. He has been compliant with his prescribed Breo Ellipta 200 regimen, which he takes once daily. This treatment appears to be effectively managing his asthma, as he reports no significant respiratory distress and only occasional use of a rescue inhaler, approximately once daily. He has not been taking montelukast, a medication previously listed in his regimen. The patient has received his flu shot. He demonstrates a limited understanding of his disease process. The patient's asthma, characterized by an eosinophilic and allergic phenotype, appears to be well-controlled under the current treatment plan.      DATA 07/11/2018 PFTs: FEV1 0.77 L or 31% predicted, FVC 1.52 L or 47% predicted, FEV1/FVC 51%, there was a 40% net change on FEV1 postbronchodilator.  There was air trapping but no hyperinflation noted on lung volumes.  Consistent with severe obstructive airways disease with significant bronchodilator response 05/18/2022 coronary  CTA: Lung windows nonrevealing. 05/25/2022 chest x-ray PA and lateral: Hyperinflation otherwise no abnormalities. Review of Systems A 10 point review of systems was performed and it is as noted above otherwise negative. 08/03/2022 nitric oxide: 57 ppb 08/25/2022 alpha 1: Phenotype MM, level 124 08/25/2022 IgE,Allergen panel/eosinophils: IgE 325, multiple allergens identified, no eosinophilia 09/12/2022 PFTs: FEV1 1.28 L or 44% predicted, FVC 1.93 L or 47% predicted, FEV1/FVC 66%, no significant bronchodilator response.  Pseudo restriction may be on the basis of air trapping. 06/06/2023 CBC: 900 eosinophils noted. 06/15/2023 nitirc oxide:39 ppb 07/30/2023 nitric oxide: 25 ppb   Review of Systems A 10 point review of systems was performed and it is as noted above otherwise negative.   Patient Active Problem List   Diagnosis Date Noted   Swelling of lower extremity 04/13/2023   Allergic rhinitis 02/13/2023   Otitis externa 02/13/2023   Diarrhea 10/30/2021   Iron deficiency 05/05/2020   Hyperplasia of prostate with lower urinary tract symptoms (LUTS) 07/10/2018   Exotropia 07/10/2018   Blepharoconjunctivitis 07/10/2018   Primary open angle glaucoma (POAG) 06/28/2018   B12 deficiency 05/14/2018   Liver hemangioma 01/24/2018   Thrombocytopenia (HCC) 12/19/2017   Change in stool 10/12/2017   Asthma 08/30/2017   Urinary urgency 05/23/2017   Thyroid nodule 06/13/2016   Primary open angle glaucoma (POAG) of right eye, moderate stage 04/10/2016   Shortness of breath 08/30/2015   Bruit 06/14/2015   Health care maintenance 12/13/2014   Glaucoma 09/21/2014   Goiter 06/28/2014   Hypertension 06/28/2014   Leukopenia 06/28/2014   History of colonic polyps 06/28/2014   Enlarged prostate 06/28/2014   GERD (gastroesophageal reflux  disease) 06/28/2014   History of adenomatous polyp of colon 06/26/2014   Gastrointestinal ulcer due to Helicobacter pylori 06/26/2014   Male erectile  dysfunction, unspecified 07/24/2012   Incomplete emptying of bladder 07/24/2012   Elevated prostate specific antigen (PSA) 07/24/2012   Benign localized hyperplasia of prostate with urinary obstruction 07/24/2012    Social History   Tobacco Use   Smoking status: Never   Smokeless tobacco: Never  Substance Use Topics   Alcohol use: No    Alcohol/week: 0.0 standard drinks of alcohol    Allergies  Allergen Reactions   Brimonidine     Other reaction(s): Eye Redness   Brimonidine Tartrate     Other reaction(s): Eye redness   Brinzolamide-Brimonidine     Other reaction(s): Eye redness   Other Other (See Comments)    Simbrinza Eye Drops gives pt Eye redness    Current Meds  Medication Sig   albuterol (VENTOLIN HFA) 108 (90 Base) MCG/ACT inhaler TAKE 2 PUFFS BY MOUTH EVERY 6 HOURS AS NEEDED   amLODipine (NORVASC) 2.5 MG tablet One tablet bid   ASPIRIN 81 PO Take by mouth.   ferrous sulfate 325 (65 FE) MG EC tablet Take 325 mg by mouth daily.   finasteride (PROSCAR) 5 MG tablet Take 1 tablet (5 mg total) by mouth daily.   fluticasone (FLONASE) 50 MCG/ACT nasal spray Place 2 sprays into both nostrils as needed.   fluticasone furoate-vilanterol (BREO ELLIPTA) 200-25 MCG/ACT AEPB TAKE 1 PUFF BY MOUTH EVERY DAY   latanoprost (XALATAN) 0.005 % ophthalmic solution ADMINISTER 1 DROP TO THE RIGHT EYE NIGHTLY.   omeprazole (PRILOSEC) 40 MG capsule Take 1 capsule (40 mg total) by mouth daily.   rosuvastatin (CRESTOR) 10 MG tablet TAKE 1 TABLET BY MOUTH EVERY DAY   tamsulosin (FLOMAX) 0.4 MG CAPS capsule Take 1 capsule (0.4 mg total) by mouth daily.   timolol (TIMOPTIC) 0.5 % ophthalmic solution Place 1 drop into the right eye 2 (two) times daily.   valsartan (DIOVAN) 320 MG tablet Take 0.5 tablets (160 mg total) by mouth daily.   vitamin B-12 (CYANOCOBALAMIN) 1000 MCG tablet Take by mouth.    Immunization History  Administered Date(s) Administered   Fluad Quad(high Dose 65+) 08/27/2020,  07/11/2021, 06/16/2022   Influenza, High Dose Seasonal PF 06/13/2016, 08/20/2017, 07/31/2018, 08/22/2019, 06/15/2023   Influenza,inj,Quad PF,6+ Mos 07/20/2015   PFIZER Comirnaty(Gray Top)Covid-19 Tri-Sucrose Vaccine 02/26/2021, 06/28/2022   PFIZER(Purple Top)SARS-COV-2 Vaccination 10/30/2019, 11/20/2019, 07/05/2020   Pfizer Covid-19 Vaccine Bivalent Booster 6yrs & up 07/01/2021   Pfizer(Comirnaty)Fall Seasonal Vaccine 12 years and older 07/04/2023   Pneumococcal Conjugate-13 06/10/2015   Pneumococcal Polysaccharide-23 06/30/2013   Respiratory Syncytial Virus Vaccine,Recomb Aduvanted(Arexvy) 10/07/2022   Tdap 10/25/2021   Zoster Recombinant(Shingrix) 07/06/2018, 08/15/2021   Zoster, Live 06/30/2013        Objective:   BP (!) 142/82 (BP Location: Right Arm, Cuff Size: Normal)   Pulse (!) 59   Temp (!) 97.1 F (36.2 C)   Ht 5\' 10"  (1.778 m)   Wt 151 lb 6.4 oz (68.7 kg)   SpO2 100%   BMI 21.72 kg/m   SpO2: 100 % O2 Device: None (Room air)  GENERAL: Thin, fit appearing gentleman, looks younger than stated age, fully ambulatory, no conversational dyspnea HEAD: Normocephalic, atraumatic.  EYES: Pupils equal, round, reactive to light.  No scleral icterus.  Strabismus with exotropia of right eye. MOUTH: Dentition intact, oral mucosa moist.  No thrush. NECK: Supple. No thyromegaly. Trachea midline. No JVD.  No  adenopathy. PULMONARY: Good air entry bilaterally.  No adventitious sounds. CARDIOVASCULAR: S1 and S2. Regular rate and rhythm.  No rubs, murmurs or gallops heard. ABDOMEN: Benign. MUSCULOSKELETAL: No joint deformity, no clubbing, no edema.  NEUROLOGIC: No overt focal deficit, no gait disturbance, speech is fluent. SKIN: Intact,warm,dry. PSYCH: Mood and behavior normal.  Lab Results  Component Value Date   NITRICOXIDE 30 09/13/2023  *39>>25>>30       Assessment & Plan:     ICD-10-CM   1. Severe persistent asthma without complication  J45.50 Nitric oxide    2.  Environmental allergies  Z91.09      Orders Placed This Encounter  Procedures   Nitric oxide   Discussion:    Asthma Eosinophilic and allergic phenotype. Lifelong non-smoker. Well-controlled on Breo Ellipta 200 mcg daily. Uses rescue inhaler once daily, no wheezing on examination. Poor understanding of disease process. Informed consent provided regarding Breo Ellipta's role in controlling inflammation and preventing flare-ups. Emphasized compliance and risks of non-compliance, including symptom exacerbation. - Continue Breo Ellipta 200 mcg daily - Discontinue montelukast - Follow up in 3-4 months  General Health Maintenance Received flu vaccination. - Continue annual flu vaccinations.      Gailen Shelter, MD Advanced Bronchoscopy [facility]    *This note was generated using voice recognition software/Dragon and/or AI transcription program.  Despite best efforts to proofread, errors can occur which can change the meaning. Any transcriptional errors that result from this process are unintentional and may not be fully corrected at the time of dictation.

## 2023-09-13 NOTE — Patient Instructions (Signed)
VISIT SUMMARY:  Today, we reviewed your asthma management and overall health. You are doing well with your current treatment and have received your flu shot.  YOUR PLAN:  -ASTHMA: Asthma is a condition where your airways narrow and swell, producing extra mucus, which can make breathing difficult. Your asthma is well-controlled with your current medication, Breo Ellipta 200 mcg, which you should continue taking daily. We discussed the importance of this medication in preventing flare-ups and controlling inflammation. You should stop taking montelukast as it is no longer needed. Please continue to use your rescue inhaler as needed, but it is good to hear you are only using it about once a day.  -GENERAL HEALTH MAINTENANCE: You have received your flu vaccination, which is important for preventing the flu. Please continue to get your flu shot every year.  INSTRUCTIONS:  Please follow up in 3-4 months for a review of your asthma management and overall health.

## 2023-09-14 DIAGNOSIS — H401123 Primary open-angle glaucoma, left eye, severe stage: Secondary | ICD-10-CM | POA: Diagnosis not present

## 2023-09-14 DIAGNOSIS — H401112 Primary open-angle glaucoma, right eye, moderate stage: Secondary | ICD-10-CM | POA: Diagnosis not present

## 2023-09-14 DIAGNOSIS — H2512 Age-related nuclear cataract, left eye: Secondary | ICD-10-CM | POA: Diagnosis not present

## 2023-09-14 DIAGNOSIS — Z961 Presence of intraocular lens: Secondary | ICD-10-CM | POA: Diagnosis not present

## 2023-09-17 ENCOUNTER — Ambulatory Visit: Payer: Medicare Other | Admitting: Family Medicine

## 2023-09-17 ENCOUNTER — Encounter: Payer: Self-pay | Admitting: Family Medicine

## 2023-09-17 ENCOUNTER — Telehealth: Payer: Self-pay | Admitting: Internal Medicine

## 2023-09-17 VITALS — BP 152/86 | HR 46 | Temp 97.7°F | Resp 18 | Ht 70.0 in | Wt 152.1 lb

## 2023-09-17 DIAGNOSIS — I10 Essential (primary) hypertension: Secondary | ICD-10-CM | POA: Diagnosis not present

## 2023-09-17 DIAGNOSIS — R040 Epistaxis: Secondary | ICD-10-CM | POA: Diagnosis not present

## 2023-09-17 NOTE — Progress Notes (Unsigned)
SUBJECTIVE:   Chief Complaint  Patient presents with   Hypertension    Had a nose bleed bp was 174/94   HPI Presents for acute visit  Discussed the use of AI scribe software for clinical note transcription with the patient, who gave verbal consent to proceed.  History of Present Illness The patient, under the care of Dr. Lorin Picket, presents with concerns about fluctuating blood pressure that has been difficult to regulate. She reports that her blood pressure is typically high upon waking in the morning, but responds to medication and decreases throughout the day. However, the patient has been unable to achieve a consistent blood pressure level.  The patient experienced a nosebleed the previous night, which was a new symptom for her. The nosebleed occurred late at night, after midnight, and was managed by the patient through immediate packing. The bleeding was described as a constant trickle, but not a significant amount. The patient denied any associated symptoms such as headaches or visual problems. The nosebleed was not associated with any known trauma or nose blowing, and the patient has not experienced a nosebleed in years.  The patient is currently on amlodipine 2.5mg  twice daily and valsartan 320mg  at night for blood pressure management. He also takes a baby aspirin daily. The patient monitors her own blood pressure and reports that it can range from 154 to 174 systolic in the mornings. She has been experimenting with the timing of her amlodipine dose in an attempt to better regulate her blood pressure.  The patient also has a history of asthma but reports no exacerbation of symptoms. She is followed by a cardiologist due to her blood pressure issues and is due for a follow-up visit in a few weeks. The patient also sees an ENT specialist every six months for ear cleaning and recently had an appointment last month. He uses Flonase regularly and has not had any recent sinus infections.  The  patient denies any other bleeding episodes, chest pain, shortness of breath, or weakness. She reports a good appetite and no issues with eating or drinking. The patient lives in an environment that can be dry at times, but she does not use a CPAP machine or supplemental oxygen. She has not had any recent travel or changes in environment.    PERTINENT PMH / PSH: As above  OBJECTIVE:  BP (!) 152/86   Pulse (!) 46   Temp 97.7 F (36.5 C)   Resp 18   Ht 5\' 10"  (1.778 m)   Wt 152 lb 2 oz (69 kg)   SpO2 97%   BMI 21.83 kg/m    Physical Exam Vitals reviewed.  Constitutional:      General: He is not in acute distress.    Appearance: Normal appearance. He is normal weight. He is not ill-appearing, toxic-appearing or diaphoretic.  HENT:     Right Ear: Tympanic membrane, ear canal and external ear normal.     Left Ear: Tympanic membrane, ear canal and external ear normal.     Nose: No nasal deformity, signs of injury, laceration, nasal tenderness, mucosal edema, congestion or rhinorrhea.     Right Nostril: No foreign body, epistaxis, septal hematoma or occlusion.     Left Nostril: No foreign body, epistaxis, septal hematoma or occlusion.     Right Turbinates: Not swollen or pale.     Left Turbinates: Not swollen or pale.     Mouth/Throat:     Mouth: Mucous membranes are moist.  Tongue: No lesions.     Pharynx: Oropharynx is clear. No postnasal drip.  Eyes:     General:        Right eye: No discharge.        Left eye: No discharge.  Cardiovascular:     Rate and Rhythm: Regular rhythm. Bradycardia present.     Heart sounds: Normal heart sounds.  Pulmonary:     Effort: Pulmonary effort is normal.     Breath sounds: Normal breath sounds. No wheezing or rhonchi.  Abdominal:     General: Bowel sounds are normal.  Musculoskeletal:        General: Normal range of motion.     Cervical back: Normal range of motion.  Skin:    General: Skin is warm and dry.  Neurological:     Mental  Status: He is alert and oriented to person, place, and time. Mental status is at baseline.  Psychiatric:        Mood and Affect: Mood normal.        Behavior: Behavior normal.        Thought Content: Thought content normal.        Judgment: Judgment normal.        09/17/2023    9:42 AM 06/07/2023    2:07 PM 04/11/2023   11:59 AM 10/27/2022    1:07 PM 08/25/2022    1:41 PM  Depression screen PHQ 2/9  Decreased Interest 0 0 0 0 0  Down, Depressed, Hopeless 0 0 0 0 0  PHQ - 2 Score 0 0 0 0 0  Altered sleeping 0 0     Tired, decreased energy 0 0     Change in appetite 0 0     Feeling bad or failure about yourself  0 0     Trouble concentrating 0 0     Moving slowly or fidgety/restless 0 0     Suicidal thoughts 0 0     PHQ-9 Score 0 0     Difficult doing work/chores Not difficult at all Not difficult at all         09/17/2023    9:42 AM 06/07/2023    2:07 PM  GAD 7 : Generalized Anxiety Score  Nervous, Anxious, on Edge 0 0  Control/stop worrying 0 0  Worry too much - different things 0 0  Trouble relaxing 0 0  Restless 0 0  Easily annoyed or irritable 0 0  Afraid - awful might happen 0 0  Total GAD 7 Score 0 0  Anxiety Difficulty Not difficult at all Not difficult at all    ASSESSMENT/PLAN:  Primary hypertension Assessment & Plan: Fluctuating blood pressure with morning peaks. Currently on Amlodipine 2.5mg  twice daily and Valsartan 320mg  at night. No recent medication changes. Just took medication prior to office visit -Continue current regimen. -Check blood pressure regularly and report readings to Dr. Lorin Picket at follow-up visit next week.   Epistaxis Assessment & Plan: Single episode of nosebleed last night, self-resolved with packing. No recurrent episodes. No other bleeding noted. Patient on daily baby aspirin. Recent CBC reassuring. -Continue baby aspirin as bleeding has resolved. -Advised on measures to prevent future nosebleeds, including humidified air and nasal  saline sprays. -If future nosebleeds occur, consider stopping aspirin and seek immediate medical attention if bleeding is severe or persistent.      PDMP reviewed  Return in about 1 week (around 09/24/2023) for PCP.  Dana Allan, MD

## 2023-09-17 NOTE — Assessment & Plan Note (Signed)
Fluctuating blood pressure with morning peaks. Currently on Amlodipine 2.5mg  twice daily and Valsartan 320mg  at night. No recent medication changes. Just took medication prior to office visit -Continue current regimen. -Check blood pressure regularly and report readings to Dr. Lorin Picket at follow-up visit next week.

## 2023-09-17 NOTE — Patient Instructions (Addendum)
It was a pleasure meeting you today. Thank you for allowing me to take part in your health care.  Our goals for today as we discussed include:  Continue current medication  Great job with stopping the nosebleed.  If it happens again but lasts longer that 10 mins go to the ED.  If you feel drainage down the back of your throat and taste blood go to the ED.  Follow up with PCP as scheduled next week  If you have any questions or concerns, please do not hesitate to call the office at [phone].  I look forward to our next visit and until then take care and stay safe.  Regards,   Dana Allan, MD   [facility]

## 2023-09-17 NOTE — Telephone Encounter (Signed)
FYI: This call has been transferred to Access Nurse. Once the result note has been entered staff can address the message at that time.  Patient called in with the following symptoms:  Red Word:elevated blood pressure. Nose bleed   Please advise at Mobile [phone] (mobile)  Message is routed to Provider Pool and [facility] Triage   Pt called in stating he was experiencing a nose bleed last night, 12/8 & now his BP is high, running 174/94. No other symptoms. Scheduled pt with Dr. Clent Ridges @ 9:40am, today, 12/9. Transferred to access nurse.

## 2023-09-17 NOTE — Telephone Encounter (Signed)
  Pt is scheduled to see Dr. Clent Ridges today 12/9 @ 9:40

## 2023-09-17 NOTE — Assessment & Plan Note (Addendum)
Single episode of nosebleed last night, self-resolved with packing. No recurrent episodes. No other bleeding noted. Patient on daily baby aspirin. Recent CBC reassuring. -Continue baby aspirin as bleeding has resolved. -Advised on measures to prevent future nosebleeds, including humidified air and nasal saline sprays. -If future nosebleeds occur, consider stopping aspirin and seek immediate medical attention if bleeding is severe or persistent.

## 2023-09-17 NOTE — Telephone Encounter (Signed)
Reviewed.  Was evaluated today in office. Please f/u as we discussed.

## 2023-09-18 NOTE — Telephone Encounter (Signed)
FYI  BP this AM was 169/88. Pulse 52 11:00 151/85. Pulse 61.  Confirmed patient doing ok and is ok to wait until 12/16 to follow up.

## 2023-09-18 NOTE — Telephone Encounter (Signed)
Noted.  Continue to monitor blood pressure and let us know if any problems.

## 2023-09-18 NOTE — Telephone Encounter (Signed)
Pt aware.

## 2023-09-20 ENCOUNTER — Encounter: Payer: Self-pay | Admitting: Family Medicine

## 2023-09-24 ENCOUNTER — Encounter: Payer: Self-pay | Admitting: Internal Medicine

## 2023-09-24 ENCOUNTER — Ambulatory Visit (INDEPENDENT_AMBULATORY_CARE_PROVIDER_SITE_OTHER): Payer: Medicare Other | Admitting: Internal Medicine

## 2023-09-24 VITALS — BP 132/72 | HR 63 | Temp 98.2°F | Resp 16 | Ht 70.0 in | Wt 153.8 lb

## 2023-09-24 DIAGNOSIS — J4520 Mild intermittent asthma, uncomplicated: Secondary | ICD-10-CM | POA: Diagnosis not present

## 2023-09-24 DIAGNOSIS — K219 Gastro-esophageal reflux disease without esophagitis: Secondary | ICD-10-CM | POA: Diagnosis not present

## 2023-09-24 DIAGNOSIS — D696 Thrombocytopenia, unspecified: Secondary | ICD-10-CM | POA: Diagnosis not present

## 2023-09-24 DIAGNOSIS — I10 Essential (primary) hypertension: Secondary | ICD-10-CM

## 2023-09-24 DIAGNOSIS — D72819 Decreased white blood cell count, unspecified: Secondary | ICD-10-CM

## 2023-09-24 DIAGNOSIS — E611 Iron deficiency: Secondary | ICD-10-CM | POA: Diagnosis not present

## 2023-09-24 DIAGNOSIS — E041 Nontoxic single thyroid nodule: Secondary | ICD-10-CM

## 2023-09-24 DIAGNOSIS — Z0000 Encounter for general adult medical examination without abnormal findings: Secondary | ICD-10-CM

## 2023-09-24 MED ORDER — HYDRALAZINE HCL 10 MG PO TABS: 10.0000 mg | ORAL_TABLET | Freq: Three times a day (TID) | ORAL | 3 refills | Status: DC

## 2023-09-24 NOTE — Assessment & Plan Note (Addendum)
Physical today 09/24/23.  Colonoscopy 01/2018.  Saw GI. Per note, colonoscopy scheduled for 10/09/23. Dr Lonna Cobb follows prostate.

## 2023-09-24 NOTE — Assessment & Plan Note (Signed)
Follow cbc.  Overall stable.

## 2023-09-24 NOTE — Assessment & Plan Note (Signed)
Currently on diovan 320mg  q day.  Taking amlodipine 2.5mg  bid. Had swelling with increased dose of amlodipine. Responds well to amlodipine, but cannot tolerate increased dose.  Discussed medication options. He wants to avoid diuretics due to increased urinary frequency. Discussed trial of spironolactone. Still would like to avoid due to above. Avoid beta blockers due to pulse rate average 50-60. Discussed hydralazine.  Agreeable. Start 10mg  tid. Follow pressures.  Get him back in soon to reassess.

## 2023-09-24 NOTE — Progress Notes (Signed)
Subjective:    Patient ID: Jose Perry, male    DOB: 1940/11/19, 82 y.o.   MRN: [id number]  Patient here for  Chief Complaint  Patient presents with   Annual Exam    HPI With past history of hypertension and mild intermittent asthma, who comes in today to follow up on these issues as well as for a complete physical exam. Had f/u with Dr Jayme Cloud 09/13/23 - on breo. Has rescue inhaler. Breathing stable. No acute flares. No chest pain. Stays active. No acid reflux reported. No abdominal pain or bowel change reported. Blood pressure varying.  Reviewed outside checks.  Higher than goal.    Past Medical History:  Diagnosis Date   Asthma    Dyspnea    Elevated blood pressure    Enlarged prostate    GERD (gastroesophageal reflux disease)    Glaucoma    History of adenomatous polyp of colon    Hx of colonic polyps    Hypertension    Leukopenia    has had an extensive w/up.     Liver hemangioma 01/24/2018   Thyroid goiter    Past Surgical History:  Procedure Laterality Date   BACK SURGERY  2002   ruptured disc   CATARACT EXTRACTION W/PHACO Right 06/30/2020   Procedure: CATARACT EXTRACTION PHACO AND INTRAOCULAR LENS PLACEMENT (IOC) RIGHT, M[place]ugin 7.19 01:30.8 7.9%;  Surgeon: Lockie Mola, MD;  Location: [facility];  Service: Ophthalmology;  Laterality: Right;   COLONOSCOPY W/ POLYPECTOMY  04/24/2005, 07/06/2010, 07/08/2014   COLONOSCOPY WITH PROPOFOL N/A 02/01/2018   Procedure: COLONOSCOPY WITH PROPOFOL;  Surgeon: Scot Jun, MD;  Location: [facility];  Service: Endoscopy;  Laterality: N/A;   ESOPHAGOGASTRODUODENOSCOPY  04/24/2005, 07/06/2010,   ESOPHAGOGASTRODUODENOSCOPY (EGD) WITH PROPOFOL N/A 05/10/2022   Procedure: ESOPHAGOGASTRODUODENOSCOPY (EGD) WITH PROPOFOL;  Surgeon: Toledo, Boykin Nearing, MD;  Location: [facility] END[facility]COPY;  Service: Gastroenterology;  Laterality: N/A;   EYE SURGERY  2009   relieve pressure from glaucoma   THYROID SURGERY  1968   goiter    Family History  Problem Relation Age of Onset   Heart disease Mother    Alcohol abuse Father    Hyperlipidemia Father    Stroke Father    Social History   Socioeconomic History   Marital status: Married    Spouse name: Not on file   Number of children: Not on file   Years of education: Not on file   Highest education level: Some college, no degree  Occupational History   Not on file  Tobacco Use   Smoking status: Never   Smokeless tobacco: Never  Vaping Use   Vaping status: Never Used  Substance and Sexual Activity   Alcohol use: No    Alcohol/week: 0.0 standard drinks of alcohol   Drug use: No   Sexual activity: Yes  Other Topics Concern   Not on file  Social History Narrative   Not on file   Social Drivers of Health   Financial Resource Strain: Low Risk  (09/21/2023)   Overall Financial Resource Strain (CARDIA)    Difficulty of Paying Living Expenses: Not hard at all  Food Insecurity: No Food Insecurity (09/21/2023)   Hunger Vital Sign    Worried About Running Out of Food in the Last Year: Never true    Ran Out of Food in the Last Year: Never true  Transportation Needs: No Transportation Needs (09/21/2023)   PRAPARE - Administrator, Civil Service (Medical): No  Lack of Transportation (Non-Medical): No  Physical Activity: Sufficiently Active (09/21/2023)   Exercise Vital Sign    Days of Exercise per Week: 7 days    Minutes of Exercise per Session: 90 min  Stress: No Stress Concern Present (09/21/2023)   [employer] of Occupational Health - Occupational Stress Questionnaire    Feeling of Stress : Not at all  Social Connections: Moderately Isolated (09/21/2023)   Social Connection and Isolation Panel [NHANES]    Frequency of Communication with Friends and Family: Three times a week    Frequency of Social Gatherings with Friends and Family: Once a week    Attends Religious Services: Never    Database administrator or Organizations: No     Attends Engineer, structural: Not on file    Marital Status: Married     Review of Systems  Constitutional:  Negative for appetite change and unexpected weight change.  HENT:  Negative for congestion, sinus pressure and sore throat.   Eyes:  Negative for pain and visual disturbance.  Respiratory:  Negative for cough and chest tightness.        Breathing stable.   Cardiovascular:  Negative for chest pain, palpitations and leg swelling.  Gastrointestinal:  Negative for abdominal pain, diarrhea, nausea and vomiting.  Genitourinary:  Negative for difficulty urinating and dysuria.  Musculoskeletal:  Negative for joint swelling and myalgias.  Skin:  Negative for color change and rash.  Neurological:  Negative for dizziness and headaches.  Hematological:  Negative for adenopathy. Does not bruise/bleed easily.  Psychiatric/Behavioral:  Negative for agitation and dysphoric mood.        Objective:     BP 132/72   Pulse 63   Temp 98.2 F (36.8 C)   Resp 16   Ht 5\' 10"  (1.778 m)   Wt 153 lb 12.8 oz (69.8 kg)   SpO2 98%   BMI 22.07 kg/m  Wt Readings from Last 3 Encounters:  09/24/23 153 lb 12.8 oz (69.8 kg)  09/17/23 152 lb 2 oz (69 kg)  09/13/23 151 lb 6.4 oz (68.7 kg)    Physical Exam Constitutional:      General: He is not in acute distress.    Appearance: Normal appearance. He is well-developed.  HENT:     Head: Normocephalic and atraumatic.     Right Ear: External ear normal.     Left Ear: External ear normal.     Mouth/Throat:     Pharynx: No oropharyngeal exudate or posterior oropharyngeal erythema.  Eyes:     General: No scleral icterus.       Right eye: No discharge.        Left eye: No discharge.     Conjunctiva/sclera: Conjunctivae normal.  Neck:     Thyroid: No thyromegaly.  Cardiovascular:     Rate and Rhythm: Normal rate and regular rhythm.  Pulmonary:     Effort: No respiratory distress.     Breath sounds: Normal breath sounds. No wheezing.   Abdominal:     General: Bowel sounds are normal.     Palpations: Abdomen is soft.     Tenderness: There is no abdominal tenderness.  Musculoskeletal:        General: No swelling or tenderness.     Cervical back: Neck supple. No tenderness.  Lymphadenopathy:     Cervical: No cervical adenopathy.  Skin:    Findings: No erythema or rash.  Neurological:     Mental Status: He is alert and  oriented to person, place, and time.  Psychiatric:        Mood and Affect: Mood normal.        Behavior: Behavior normal.      Outpatient Encounter Medications as of 09/24/2023  Medication Sig   albuterol (VENTOLIN HFA) 108 (90 Base) MCG/ACT inhaler TAKE 2 PUFFS BY MOUTH EVERY 6 HOURS AS NEEDED   amLODipine (NORVASC) 2.5 MG tablet One tablet bid   ASPIRIN 81 PO Take by mouth.   ferrous sulfate 325 (65 FE) MG EC tablet Take 325 mg by mouth daily.   finasteride (PROSCAR) 5 MG tablet Take 1 tablet (5 mg total) by mouth daily.   fluticasone (FLONASE) 50 MCG/ACT nasal spray Place 2 sprays into both nostrils as needed.   fluticasone furoate-vilanterol (BREO ELLIPTA) 200-25 MCG/ACT AEPB TAKE 1 PUFF BY MOUTH EVERY DAY   hydrALAZINE (APRESOLINE) 10 MG tablet Take 1 tablet (10 mg total) by mouth 3 (three) times daily.   latanoprost (XALATAN) 0.005 % ophthalmic solution ADMINISTER 1 DROP TO THE RIGHT EYE NIGHTLY.   omeprazole (PRILOSEC) 40 MG capsule Take 1 capsule (40 mg total) by mouth daily.   rosuvastatin (CRESTOR) 10 MG tablet TAKE 1 TABLET BY MOUTH EVERY DAY   tamsulosin (FLOMAX) 0.4 MG CAPS capsule Take 1 capsule (0.4 mg total) by mouth daily.   timolol (TIMOPTIC) 0.5 % ophthalmic solution Place 1 drop into the right eye 2 (two) times daily.   valsartan (DIOVAN) 320 MG tablet Take 0.5 tablets (160 mg total) by mouth daily.   vitamin B-12 (CYANOCOBALAMIN) 1000 MCG tablet Take by mouth.   No facility-administered encounter medications on file as of 09/24/2023.     Lab Results  Component Value Date    WBC 2.9 (L) 09/11/2023   HGB 13.4 09/11/2023   HCT 41.3 09/11/2023   PLT 140.0 (L) 09/11/2023   GLUCOSE 89 09/11/2023   CHOL 170 09/11/2023   TRIG 42.0 09/11/2023   HDL 77.00 09/11/2023   LDLCALC 85 09/11/2023   ALT 22 09/11/2023   AST 32 09/11/2023   NA 137 09/11/2023   K 4.3 09/11/2023   CL 101 09/11/2023   CREATININE 1.29 09/11/2023   BUN 14 09/11/2023   CO2 31 09/11/2023   TSH 1.83 09/11/2023   PSA 2.86 07/11/2021       Assessment & Plan:  Primary hypertension Assessment & Plan: Currently on diovan 320mg  q day.  Taking amlodipine 2.5mg  bid. Had swelling with increased dose of amlodipine. Responds well to amlodipine, but cannot tolerate increased dose.  Discussed medication options. He wants to avoid diuretics due to increased urinary frequency. Discussed trial of spironolactone. Still would like to avoid due to above. Avoid beta blockers due to pulse rate average 50-60. Discussed hydralazine.  Agreeable. Start 10mg  tid. Follow pressures.  Get him back in soon to reassess.    Mild intermittent asthma, unspecified whether complicated Assessment & Plan: Using breo regularly.  Breathing overall stable.  No increased cough.  No significant acid reflux reported.  Follow.     Gastroesophageal reflux disease, unspecified whether esophagitis present Assessment & Plan: On prilosec. No significant acid reflux.  Follow.    Health care maintenance Assessment & Plan: Physical today 09/24/23.  Colonoscopy 01/2018.  Saw GI. Per note, colonoscopy scheduled for 10/09/23. Dr Lonna Cobb follows prostate.     Leukopenia, unspecified type Assessment & Plan: Has been previously evaluated by hematology. Follow.  Stable - 2.9 - 3.1.    Iron deficiency Assessment & Plan: Saw  GI.  Scheduled for colonoscopy.  Follow cbc and iron studies.    Thrombocytopenia (HCC) Assessment & Plan: Follow cbc.  Overall stable.    Thyroid nodule Assessment & Plan:  Followed by endocrinology -  10/2021.  Recommended f/u thyroid ultrasound in one year.  Ultrasound 10/2022 - stable.  Recommended f/u in 2 years.    Other orders -     hydrALAZINE HCl; Take 1 tablet (10 mg total) by mouth 3 (three) times daily.  Dispense: 90 tablet; Refill: 3     Dale Poquoson, MD

## 2023-09-24 NOTE — Assessment & Plan Note (Signed)
On prilosec. No significant acid reflux.  Follow.

## 2023-09-24 NOTE — Assessment & Plan Note (Signed)
Saw GI.  Scheduled for colonoscopy.  Follow cbc and iron studies.

## 2023-09-24 NOTE — Assessment & Plan Note (Signed)
Followed by endocrinology - 10/2021.  Recommended f/u thyroid ultrasound in one year.  Ultrasound 10/2022 - stable.  Recommended f/u in 2 years.

## 2023-09-24 NOTE — Assessment & Plan Note (Signed)
Using breo regularly.  Breathing overall stable.  No increased cough.  No significant acid reflux reported.  Follow.

## 2023-09-24 NOTE — Assessment & Plan Note (Signed)
Has been previously evaluated by hematology. Follow.  Stable - 2.9 - 3.1.

## 2023-09-27 ENCOUNTER — Encounter: Payer: Self-pay | Admitting: Internal Medicine

## 2023-10-08 ENCOUNTER — Telehealth: Payer: Self-pay | Admitting: Cardiology

## 2023-10-08 ENCOUNTER — Other Ambulatory Visit: Payer: Self-pay | Admitting: *Deleted

## 2023-10-08 MED ORDER — VALSARTAN 320 MG PO TABS: 320.0000 mg | ORAL_TABLET | Freq: Every day | ORAL | 3 refills | Status: DC

## 2023-10-08 NOTE — Telephone Encounter (Signed)
 Pt states he is returning call to a nurse

## 2023-10-08 NOTE — Telephone Encounter (Signed)
Lmovm to verify Valsartan dose and instructions. Fax refill received pt requesting Valsartan 160 mg due to having to cut pill in 1/2 every day. Pt's dose increase per Agbor-Etang at last OV.

## 2023-10-08 NOTE — Telephone Encounter (Signed)
It looks like per Dr. Azucena Cecil last office note patient should be on the 320 dose of valsartan. It looks like I sent it in but didn't change the instructions - my apologies!   "Hypertension, BP improved, slightly elevated.  Increase valsartan p.m. dose to 320 mg daily, continue Norvasc 5 mg daily."

## 2023-10-08 NOTE — Telephone Encounter (Signed)
Spoke with patient and updated him on medication and dose he should be taking. Confirmed pharmacy and sent updated prescription. Instructed patient to call back if any further questions or needs.

## 2023-10-08 NOTE — Telephone Encounter (Signed)
Disp Refills Start End   valsartan (DIOVAN) 320 MG tablet 90 tablet 3 10/08/2023 --   Sig - Route: Take 1 tablet (320 mg total) by mouth at bedtime. - Oral   Sent to pharmacy as: valsartan (DIOVAN) 320 MG tablet   E-Prescribing Status: Receipt confirmed by pharmacy (10/08/2023 12:11 PM EST)

## 2023-10-08 NOTE — Telephone Encounter (Signed)
Patient called in due to medication questions. He was told to take Valsartan 320 mg and take 1/2 tablet daily which would have been 160 mg daily. However according to his chart at last office visit 07/04/23 it is documented that he increase valsartan to 320 mg once daily. Advised patient that I would review with his assistant and that we will be in touch with further clarification.

## 2023-10-09 ENCOUNTER — Encounter: Payer: Self-pay | Admitting: Internal Medicine

## 2023-10-09 ENCOUNTER — Ambulatory Visit: Payer: Medicare Other | Admitting: Anesthesiology

## 2023-10-09 ENCOUNTER — Encounter
Admission: RE | Disposition: A | Discharge status: Home / Self Care | Payer: Self-pay | Source: Home / Self Care | Attending: Internal Medicine

## 2023-10-09 ENCOUNTER — Ambulatory Visit
Admission: RE | Admit: 2023-10-09 | Discharge: 2023-10-09 | Disposition: A | Discharge status: Home / Self Care | Payer: Medicare Other | Attending: Internal Medicine | Admitting: Internal Medicine

## 2023-10-09 ENCOUNTER — Other Ambulatory Visit: Payer: Self-pay

## 2023-10-09 DIAGNOSIS — I10 Essential (primary) hypertension: Secondary | ICD-10-CM | POA: Insufficient documentation

## 2023-10-09 DIAGNOSIS — Z860101 Personal history of adenomatous and serrated colon polyps: Secondary | ICD-10-CM | POA: Diagnosis not present

## 2023-10-09 DIAGNOSIS — J45909 Unspecified asthma, uncomplicated: Secondary | ICD-10-CM | POA: Diagnosis not present

## 2023-10-09 DIAGNOSIS — D122 Benign neoplasm of ascending colon: Secondary | ICD-10-CM | POA: Diagnosis not present

## 2023-10-09 DIAGNOSIS — Z7951 Long term (current) use of inhaled steroids: Secondary | ICD-10-CM | POA: Diagnosis not present

## 2023-10-09 DIAGNOSIS — Z79899 Other long term (current) drug therapy: Secondary | ICD-10-CM | POA: Insufficient documentation

## 2023-10-09 DIAGNOSIS — K449 Diaphragmatic hernia without obstruction or gangrene: Secondary | ICD-10-CM | POA: Insufficient documentation

## 2023-10-09 DIAGNOSIS — D696 Thrombocytopenia, unspecified: Secondary | ICD-10-CM | POA: Diagnosis not present

## 2023-10-09 DIAGNOSIS — K219 Gastro-esophageal reflux disease without esophagitis: Secondary | ICD-10-CM | POA: Insufficient documentation

## 2023-10-09 DIAGNOSIS — K640 First degree hemorrhoids: Secondary | ICD-10-CM | POA: Diagnosis not present

## 2023-10-09 DIAGNOSIS — K635 Polyp of colon: Secondary | ICD-10-CM | POA: Diagnosis not present

## 2023-10-09 DIAGNOSIS — K2100 Gastro-esophageal reflux disease with esophagitis, without bleeding: Secondary | ICD-10-CM | POA: Diagnosis not present

## 2023-10-09 DIAGNOSIS — K2950 Unspecified chronic gastritis without bleeding: Secondary | ICD-10-CM | POA: Diagnosis not present

## 2023-10-09 DIAGNOSIS — K5730 Diverticulosis of large intestine without perforation or abscess without bleeding: Secondary | ICD-10-CM | POA: Insufficient documentation

## 2023-10-09 DIAGNOSIS — K648 Other hemorrhoids: Secondary | ICD-10-CM | POA: Diagnosis not present

## 2023-10-09 DIAGNOSIS — Z09 Encounter for follow-up examination after completed treatment for conditions other than malignant neoplasm: Secondary | ICD-10-CM | POA: Diagnosis not present

## 2023-10-09 HISTORY — PX: POLYPECTOMY: SHX5525

## 2023-10-09 HISTORY — PX: COLONOSCOPY WITH PROPOFOL: SHX5780

## 2023-10-09 SURGERY — COLONOSCOPY WITH PROPOFOL
Anesthesia: General

## 2023-10-09 MED ORDER — PROPOFOL 10 MG/ML IV BOLUS
INTRAVENOUS | Status: DC | PRN
  Administered 2023-10-09: 70 mg via INTRAVENOUS

## 2023-10-09 MED ORDER — PROPOFOL 500 MG/50ML IV EMUL
INTRAVENOUS | Status: DC | PRN
  Administered 2023-10-09: 100 ug/kg/min via INTRAVENOUS

## 2023-10-09 MED ORDER — LIDOCAINE HCL (CARDIAC) PF 100 MG/5ML IV SOSY
PREFILLED_SYRINGE | INTRAVENOUS | Status: DC | PRN
  Administered 2023-10-09: 50 mg via INTRAVENOUS

## 2023-10-09 MED ORDER — SODIUM CHLORIDE 0.9 % IV SOLN
INTRAVENOUS | Status: DC
Start: 2023-10-09 — End: 2023-10-10

## 2023-10-09 NOTE — Anesthesia Postprocedure Evaluation (Signed)
 Anesthesia Post Note  Patient: Jose Perry  Procedure(s) Performed: COLONOSCOPY WITH PROPOFOL  POLYPECTOMY  Patient location during evaluation: Endoscopy Anesthesia Type: General Level of consciousness: awake and alert Pain management: pain level controlled Vital Signs Assessment: post-procedure vital signs reviewed and stable Respiratory status: spontaneous breathing, nonlabored ventilation and respiratory function stable Cardiovascular status: blood pressure returned to baseline and stable Postop Assessment: no apparent nausea or vomiting Anesthetic complications: no   No notable events documented.   Last Vitals:  Vitals:   10/09/23 1013 10/09/23 1021  BP: 102/72 119/84  Pulse: (!) 52 (!) 50  Resp:    Temp:    SpO2: 100% 99%    Last Pain:  Vitals:   10/09/23 1021  TempSrc:   PainSc: 0-No pain                 [name]

## 2023-10-09 NOTE — Op Note (Addendum)
 [facility] Patient Name: Jose Perry Procedure Date: 10/09/2023 9:26 AM MRN: [id number] Account #: [id number] Date of Birth: 07-04-1941 Admit Type: Outpatient Age: 82 Room: [facility] ENDO ROOM 2 Gender: Male Note Status: Supervisor Override Instrument Name: Arvis [id number],Ezid Colonoscope [id number] Procedure:             Colonoscopy Indications:           Adenomatous polyps in the colon Providers:             Venise Ellingwood K. Aundria MD, MD Referring MD:          Josanne Boerema K. Aundria MD, MD (Referring MD) Medicines:             Propofol  per Anesthesia Complications:         No immediate complications. Estimated blood loss:                         Minimal. Procedure:             Pre-Anesthesia Assessment:                        - The risks and benefits of the procedure and the                         sedation options and risks were discussed with the                         patient. All questions were answered and informed                         consent was obtained.                        - Patient identification and proposed procedure were                         verified prior to the procedure by the nurse. The                         procedure was verified in the procedure room.                        - ASA Grade Assessment: III - A patient with severe                         systemic disease.                        - After reviewing the risks and benefits, the patient                         was deemed in satisfactory condition to undergo the                         procedure.                        After obtaining informed consent, the colonoscope was  passed under direct vision. Throughout the procedure,                         the patient's blood pressure, pulse, and oxygen                          saturations were monitored continuously. The                         Colonoscope was introduced through the anus and                          advanced to the the cecum, identified by appendiceal                         orifice and ileocecal valve. The Colonoscope was                         introduced through the anus and advanced to the. The                         colonoscopy was performed without difficulty. The                         patient tolerated the procedure well. The quality of                         the bowel preparation was good. The ileocecal valve,                         appendiceal orifice, and rectum were photographed. Findings:      The perianal and digital rectal examinations were normal. Pertinent       negatives include normal sphincter tone and no palpable rectal lesions.      Non-bleeding internal hemorrhoids were found during retroflexion. The       hemorrhoids were Grade I (internal hemorrhoids that do not prolapse).      A few small-mouthed diverticula were found in the sigmoid colon.      A 4 mm polyp was found in the ascending colon. The polyp was sessile.       The polyp was removed with a jumbo cold forceps. Resection and retrieval       were complete.      The exam was otherwise without abnormality. Impression:            - Non-bleeding internal hemorrhoids.                        - Diverticulosis in the sigmoid colon.                        - One 4 mm polyp in the ascending colon, removed with                         a jumbo cold forceps. Resected and retrieved.                        - The examination was otherwise normal. Recommendation:        - Patient has  a contact number available for                         emergencies. The signs and symptoms of potential                         delayed complications were discussed with the patient.                         Return to normal activities tomorrow. Written                         discharge instructions were provided to the patient.                        - Resume previous diet.                        - Continue present  medications.                        - If polyps are benign or adenomatous without                         dysplasia, I will advise NO further colonoscopy due to                         advanced age and/or severe comorbidity. Procedure Code(s):     --- Professional ---                        [phone], Colonoscopy, flexible; with biopsy, single or                         multiple Diagnosis Code(s):     --- Professional ---                        K57.30, Diverticulosis of large intestine without                         perforation or abscess without bleeding                        K64.0, First degree hemorrhoids                        D12.2, Benign neoplasm of ascending colon CPT copyright 2022 American Medical Association. All rights reserved. The codes documented in this report are preliminary and upon coder review may  be revised to meet current compliance requirements. [name] MD, MD 10/09/2023 10:12:22 AM This report has been signed electronically. Number of Addenda: 0 Note Initiated On: 10/09/2023 9:26 AM Scope Withdrawal Time: 0 hours 14 minutes 54 seconds  Total Procedure Duration: 0 hours 19 minutes 23 seconds  Estimated Blood Loss:  Estimated blood loss was minimal.      [facility]

## 2023-10-09 NOTE — Interval H&P Note (Signed)
 History and Physical Interval Note:  10/09/2023 9:33 AM  Jose Perry  has presented today for surgery, with the diagnosis of V12.72 (ICD-9-CM) - Z86.010 (ICD-10-CM) - Hx of adenomatous colonic polyps.  The various methods of treatment have been discussed with the patient and family. After consideration of risks, benefits and other options for treatment, the patient has consented to  Procedure(s): COLONOSCOPY WITH PROPOFOL  (N/A) as a surgical intervention.  The patient's history has been reviewed, patient examined, no change in status, stable for surgery.  I have reviewed the patient's chart and labs.  Questions were answered to the patient's satisfaction.     [place], Jose Perry

## 2023-10-09 NOTE — H&P (Signed)
 Outpatient short stay form Pre-procedure 10/09/2023 9:27 AM Jose Perry, [name]  Primary Physician: Allena Hamilton, [name]  Reason for visit:  History of adenomatous colon polyps  History of present illness:  GERD with esophagitis, Hx of adenomatous colon polyps EGD: 07/06/2010 - LA Grade A reflux esophagitis, mild chronic gastritis, normal duodenum  - CSY: 07/08/2014 - sigmoid diverticulosis, two small TAs removed from splenic flexure and cecum, Grade I IH - CSY: 02/01/2018 - diminutive TA and SSA removed from ascending colon, medium-sized Grade I internal hemorrhoids - EGD: 05/10/2022 - normal esophagus, 2 cm hiatal hernia, chronic inactive gastritis, normal examined duodenum    Current Facility-Administered Medications:    0.9 %  sodium chloride  infusion, , Intravenous, Continuous, Lanaysia Fritchman K, MD, Last Rate: 20 mL/hr at 10/09/23 0920, New Bag at 10/09/23 0920  Medications Prior to Admission  Medication Sig Dispense Refill Last Dose/Taking   amLODipine  (NORVASC ) 2.5 MG tablet One tablet bid 60 tablet 2 10/09/2023 Morning   ASPIRIN  81 PO Take by mouth.   Past Week   ferrous sulfate 325 (65 FE) MG EC tablet Take 325 mg by mouth daily.   Past Week   hydrALAZINE  (APRESOLINE ) 10 MG tablet Take 1 tablet (10 mg total) by mouth 3 (three) times daily. 90 tablet 3 10/09/2023 Morning   omeprazole  (PRILOSEC) 40 MG capsule Take 1 capsule (40 mg total) by mouth daily. 90 capsule 1 10/08/2023   rosuvastatin  (CRESTOR ) 10 MG tablet TAKE 1 TABLET BY MOUTH EVERY DAY 90 tablet 1 10/08/2023   tamsulosin  (FLOMAX ) 0.4 MG CAPS capsule Take 1 capsule (0.4 mg total) by mouth daily. 90 capsule 3 10/08/2023   valsartan  (DIOVAN ) 320 MG tablet Take 1 tablet (320 mg total) by mouth at bedtime. 90 tablet 3 10/08/2023 Evening   albuterol  (VENTOLIN  HFA) 108 (90 Base) MCG/ACT inhaler TAKE 2 PUFFS BY MOUTH EVERY 6 HOURS AS NEEDED 8.5 each 2    finasteride  (PROSCAR ) 5 MG tablet Take 1 tablet (5 mg total) by mouth  daily. 90 tablet 3    fluticasone  (FLONASE) 50 MCG/ACT nasal spray Place 2 sprays into both nostrils as needed.      fluticasone  furoate-vilanterol (BREO ELLIPTA ) 200-25 MCG/ACT AEPB TAKE 1 PUFF BY MOUTH EVERY DAY 60 each 11    latanoprost (XALATAN) 0.005 % ophthalmic solution ADMINISTER 1 DROP TO THE RIGHT EYE NIGHTLY.      timolol  (TIMOPTIC ) 0.5 % ophthalmic solution Place 1 drop into the right eye 2 (two) times daily.      vitamin B-12 (CYANOCOBALAMIN ) 1000 MCG tablet Take by mouth.        Allergies  Allergen Reactions   Brimonidine     Other reaction(s): Eye Redness   Brimonidine Tartrate     Other reaction(s): Eye redness   Brinzolamide-Brimonidine     Other reaction(s): Eye redness   Other Other (See Comments)    Simbrinza Eye Drops gives pt Eye redness     Past Medical History:  Diagnosis Date   Asthma    Dyspnea    Elevated blood pressure    Enlarged prostate    GERD (gastroesophageal reflux disease)    Glaucoma    History of adenomatous polyp of colon    Hx of colonic polyps    Hypertension    Leukopenia    has had an extensive w/up.     Liver hemangioma 01/24/2018   Thyroid  goiter     Review of systems:  Otherwise negative.    Physical Exam  Gen: Alert, oriented. Appears stated age.  HEENT: [place]/AT. PERRLA. Lungs: CTA, no wheezes. CV: RR nl S1, S2. Abd: soft, benign, no masses. BS+ Ext: No edema. Pulses 2+    Planned procedures: Proceed with colonoscopy. The patient understands the nature of the planned procedure, indications, risks, alternatives and potential complications including but not limited to bleeding, infection, perforation, damage to internal organs and possible oversedation/side effects from anesthesia. The patient agrees and gives consent to proceed.  Please refer to procedure notes for findings, recommendations and patient disposition/instructions.     [name], [name] Gastroenterology 10/09/2023  9:27 AM

## 2023-10-09 NOTE — Transfer of Care (Signed)
 Immediate Anesthesia Transfer of Care Note  Patient: Jose Perry  Procedure(s) Performed: COLONOSCOPY WITH PROPOFOL  POLYPECTOMY  Patient Location: PACU and Endoscopy Unit  Anesthesia Type:General  Level of Consciousness: drowsy and patient cooperative  Airway & Oxygen  Therapy: Patient Spontanous Breathing  Post-op Assessment: Report given to RN and Post -op Vital signs reviewed and stable  Post vital signs: Reviewed and stable  Last Vitals:  Vitals Value Taken Time  BP 92/62 10/09/23 1002  Temp 36.1 C 10/09/23 1002  Pulse 55 10/09/23 1002  Resp 18 10/09/23 1002  SpO2 100 % 10/09/23 1002    Last Pain:  Vitals:   10/09/23 1002  TempSrc: Temporal  PainSc: Asleep         Complications: No notable events documented.

## 2023-10-09 NOTE — Anesthesia Preprocedure Evaluation (Addendum)
 Anesthesia Evaluation  Patient identified by MRN, date of birth, ID band Patient awake    Reviewed: Allergy & Precautions, NPO status , Patient's Chart, lab work & pertinent test results  Airway Mallampati: II  TM Distance: >3 FB Neck ROM: Full    Dental  (+) Teeth Intact   Pulmonary asthma    Pulmonary exam normal breath sounds clear to auscultation       Cardiovascular Exercise Tolerance: Good hypertension, Pt. on medications Normal cardiovascular exam Rhythm:Regular     Neuro/Psych negative neurological ROS  negative psych ROS   GI/Hepatic Neg liver ROS,GERD  Medicated,,  Endo/Other  negative endocrine ROS    Renal/GU negative Renal ROS  negative genitourinary   Musculoskeletal   Abdominal Normal abdominal exam  (+)   Peds negative pediatric ROS (+)  Hematology  (+) Blood dyscrasia thrombocytopenia   Anesthesia Other Findings Past Medical History: No date: Asthma No date: Dyspnea No date: Elevated blood pressure No date: Enlarged prostate No date: GERD (gastroesophageal reflux disease) No date: Glaucoma No date: History of adenomatous polyp of colon No date: Hx of colonic polyps No date: Hypertension No date: Leukopenia     Comment:  has had an extensive w/up.   01/24/2018: Liver hemangioma No date: Thyroid  goiter  Past Surgical History: 2002: BACK SURGERY     Comment:  ruptured disc 06/30/2020: CATARACT EXTRACTION W/PHACO; Right     Comment:  Procedure: CATARACT EXTRACTION PHACO AND INTRAOCULAR               LENS PLACEMENT (IOC) RIGHT, M[place]ugin 7.19 01:30.8 7.9%;                Surgeon: Mittie Gaskin, MD;  [facility]               [facility];  Service: Ophthalmology;  Laterality:               Right; 04/24/2005, 07/06/2010, 07/08/2014: COLONOSCOPY W/ POLYPECTOMY 02/01/2018: COLONOSCOPY WITH PROPOFOL ; N/A     Comment:  Procedure: COLONOSCOPY WITH PROPOFOL ;  Surgeon: Viktoria Lamar DASEN, MD;  Location: [facility] ENDOSCOPY;  Service:               Endoscopy;  Laterality: N/A; 04/24/2005, 07/06/2010,: ESOPHAGOGASTRODUODENOSCOPY 2009: EYE SURGERY     Comment:  relieve pressure from glaucoma 1968: THYROID  SURGERY     Comment:  goiter  BMI    Body Mass Index: 21.35 kg/m      Reproductive/Obstetrics negative OB ROS                             Anesthesia Physical Anesthesia Plan  ASA: 3  Anesthesia Plan: General   Post-op Pain Management:    Induction: Intravenous  PONV Risk Score and Plan: Propofol  infusion and TIVA  Airway Management Planned: Natural Airway  Additional Equipment:   Intra-op Plan:   Post-operative Plan:   Informed Consent: I have reviewed the patients History and Physical, chart, labs and discussed the procedure including the risks, benefits and alternatives for the proposed anesthesia with the patient or authorized representative who has indicated his/her understanding and acceptance.     Dental Advisory Given  Plan Discussed with: CRNA and Surgeon  Anesthesia Plan Comments:         Anesthesia Quick Evaluation

## 2023-10-10 ENCOUNTER — Encounter: Payer: Self-pay | Admitting: Emergency Medicine

## 2023-10-10 ENCOUNTER — Ambulatory Visit: Payer: Medicare Other

## 2023-10-10 ENCOUNTER — Emergency Department
Admission: EM | Admit: 2023-10-10 | Discharge: 2023-10-10 | Disposition: A | Discharge status: Home / Self Care | Payer: Medicare Other | Attending: Emergency Medicine | Admitting: Emergency Medicine

## 2023-10-10 DIAGNOSIS — R059 Cough, unspecified: Secondary | ICD-10-CM | POA: Diagnosis not present

## 2023-10-10 DIAGNOSIS — J45909 Unspecified asthma, uncomplicated: Secondary | ICD-10-CM | POA: Insufficient documentation

## 2023-10-10 DIAGNOSIS — J069 Acute upper respiratory infection, unspecified: Secondary | ICD-10-CM | POA: Diagnosis not present

## 2023-10-10 DIAGNOSIS — B9789 Other viral agents as the cause of diseases classified elsewhere: Secondary | ICD-10-CM | POA: Diagnosis not present

## 2023-10-10 DIAGNOSIS — K5730 Diverticulosis of large intestine without perforation or abscess without bleeding: Secondary | ICD-10-CM | POA: Diagnosis not present

## 2023-10-10 DIAGNOSIS — I10 Essential (primary) hypertension: Secondary | ICD-10-CM | POA: Insufficient documentation

## 2023-10-10 DIAGNOSIS — K2950 Unspecified chronic gastritis without bleeding: Secondary | ICD-10-CM | POA: Diagnosis not present

## 2023-10-10 DIAGNOSIS — K449 Diaphragmatic hernia without obstruction or gangrene: Secondary | ICD-10-CM | POA: Diagnosis not present

## 2023-10-10 DIAGNOSIS — K219 Gastro-esophageal reflux disease without esophagitis: Secondary | ICD-10-CM | POA: Diagnosis not present

## 2023-10-10 DIAGNOSIS — Z860101 Personal history of adenomatous and serrated colon polyps: Secondary | ICD-10-CM | POA: Diagnosis not present

## 2023-10-10 DIAGNOSIS — Z79899 Other long term (current) drug therapy: Secondary | ICD-10-CM | POA: Diagnosis not present

## 2023-10-10 DIAGNOSIS — R058 Other specified cough: Secondary | ICD-10-CM | POA: Diagnosis not present

## 2023-10-10 DIAGNOSIS — Z7951 Long term (current) use of inhaled steroids: Secondary | ICD-10-CM | POA: Diagnosis not present

## 2023-10-10 DIAGNOSIS — Z20822 Contact with and (suspected) exposure to covid-19: Secondary | ICD-10-CM | POA: Diagnosis not present

## 2023-10-10 DIAGNOSIS — D696 Thrombocytopenia, unspecified: Secondary | ICD-10-CM | POA: Diagnosis not present

## 2023-10-10 DIAGNOSIS — K2100 Gastro-esophageal reflux disease with esophagitis, without bleeding: Secondary | ICD-10-CM | POA: Diagnosis not present

## 2023-10-10 DIAGNOSIS — D122 Benign neoplasm of ascending colon: Secondary | ICD-10-CM | POA: Diagnosis not present

## 2023-10-10 DIAGNOSIS — K640 First degree hemorrhoids: Secondary | ICD-10-CM | POA: Diagnosis not present

## 2023-10-10 LAB — RESP PANEL BY RT-PCR (RSV, FLU A&B, COVID)  RVPGX2
Influenza A by PCR: NEGATIVE
Influenza B by PCR: NEGATIVE
Resp Syncytial Virus by PCR: NEGATIVE
SARS Coronavirus 2 by RT PCR: NEGATIVE

## 2023-10-10 NOTE — ED Triage Notes (Signed)
 Pt here from home with a productive cough  for about 2 weeks sputum is green in color

## 2023-10-10 NOTE — ED Notes (Signed)
 Unable to "room" pt in ED d/t pt still being admitted to endoscopy. Endoscopy closed today, as well as specials. AC and bed placement called to assist in getting pt moved to ED room

## 2023-10-10 NOTE — Discharge Instructions (Signed)
 Your COVID/flu/RSV swab is negative and your chest x-ray does not show pneumonia.  Please continue to get plenty of rest and stay hydrated.  Please return for any new, worsening, or change in symptoms including fever, worsening cough, chest pain, shortness of breath, trouble breathing, or any other concerns.  It was a pleasure caring for you today.

## 2023-10-10 NOTE — ED Provider Notes (Signed)
 [facility] Provider Note    Event Date/Time   First MD Initiated Contact with Patient 10/10/23 1246     (approximate)   History   Cough   HPI  Jose Perry is a 83 y.o. male who presents today for evaluation of cough for the past 1 to 2 weeks.  Patient reports that his cough is productive of green phlegm.  He denies chest pain or shortness of breath.  He reports that he had nasal congestion which has improved.  He denies any recent travel outside [place] .  He denies hemoptysis.  No known sick contacts.  Reports that he has never been a smoker.  No abdominal pain, nausea, vomiting, diarrhea.  He reports that he has had normal energy levels.  Patient Active Problem List   Diagnosis Date Noted   Epistaxis 09/17/2023   Swelling of lower extremity 04/13/2023   Allergic rhinitis 02/13/2023   Otitis externa 02/13/2023   Diarrhea 10/30/2021   Iron deficiency 05/05/2020   Hyperplasia of prostate with lower urinary tract symptoms (LUTS) 07/10/2018   Exotropia 07/10/2018   Blepharoconjunctivitis 07/10/2018   Primary open angle glaucoma (POAG) 06/28/2018   B12 deficiency 05/14/2018   Liver hemangioma 01/24/2018   Thrombocytopenia (HCC) 12/19/2017   Change in stool 10/12/2017   Asthma 08/30/2017   Urinary urgency 05/23/2017   Thyroid  nodule 06/13/2016   Primary open angle glaucoma (POAG) of right eye, moderate stage 04/10/2016   Shortness of breath 08/30/2015   Bruit 06/14/2015   Health care maintenance 12/13/2014   Glaucoma 09/21/2014   Goiter 06/28/2014   Hypertension 06/28/2014   Leukopenia 06/28/2014   History of colonic polyps 06/28/2014   Enlarged prostate 06/28/2014   GERD (gastroesophageal reflux disease) 06/28/2014   History of adenomatous polyp of colon 06/26/2014   Gastrointestinal ulcer due to Helicobacter pylori 06/26/2014   Male erectile dysfunction, unspecified 07/24/2012   Incomplete emptying of bladder 07/24/2012   Elevated  prostate specific antigen (PSA) 07/24/2012   Benign localized hyperplasia of prostate with urinary obstruction 07/24/2012          Physical Exam   Triage Vital Signs: ED Triage Vitals  Encounter Vitals Group     BP 10/10/23 1217 (!) 150/73     Systolic BP Percentile --      Diastolic BP Percentile --      Pulse Rate 10/10/23 1217 69     Resp 10/10/23 1217 18     Temp 10/10/23 1217 98.2 F (36.8 C)     Temp src --      SpO2 10/10/23 1217 98 %     Weight 10/10/23 1216 148 lb (67.1 kg)     Height 10/10/23 1216 5' 10 (1.778 m)     Head Circumference --      Peak Flow --      Pain Score 10/10/23 1216 0     Pain Loc --      Pain Education --      Exclude from Growth Chart --     Most recent vital signs: Vitals:   10/10/23 1217  BP: (!) 150/73  Pulse: 69  Resp: 18  Temp: 98.2 F (36.8 C)  SpO2: 98%    Physical Exam Vitals and nursing note reviewed.  Constitutional:      General: Awake and alert. No acute distress.    Appearance: Normal appearance. The patient is normal weight.  HENT:     Head: Normocephalic and atraumatic.  Mouth: Mucous membranes are moist.  Eyes:     General: PERRL. Normal EOMs        Right eye: No discharge.        Left eye: No discharge.     Conjunctiva/sclera: Conjunctivae normal.  Cardiovascular:     Rate and Rhythm: Normal rate and regular rhythm.     Pulses: Normal pulses.  Pulmonary:     Effort: Pulmonary effort is normal. No respiratory distress.  No accessory muscle use, no tachypnea.    Breath sounds: Normal breath sounds.  Lungs are clear to auscultation bilaterally Abdominal:     Abdomen is soft. There is no abdominal tenderness. No rebound or guarding. No distention. Musculoskeletal:        General: No swelling. Normal range of motion.     Cervical back: Normal range of motion and neck supple.  Skin:    General: Skin is warm and dry.     Capillary Refill: Capillary refill takes less than 2 seconds.     Findings: No  rash.  Neurological:     Mental Status: The patient is awake and alert.      ED Results / Procedures / Treatments   Labs (all labs ordered are listed, but only abnormal results are displayed) Labs Reviewed  RESP PANEL BY RT-PCR (RSV, FLU A&B, COVID)  [id number]     EKG     RADIOLOGY I independently reviewed and interpreted imaging and agree with radiologists findings.     PROCEDURES:  Critical Care performed:   Procedures   MEDICATIONS ORDERED IN ED: Medications - No data to display   IMPRESSION / MDM / ASSESSMENT AND PLAN / ED COURSE  I reviewed the triage vital signs and the nursing notes.   Differential diagnosis includes, but is not limited to, bronchitis, pneumonia, COVID, flu, RSV, other viral etiology.  Patient is awake and alert, hemodynamically stable and afebrile.  He has normal oxygen  saturation of 98% on room air.  His lungs are clear to auscultation bilaterally, he has no tachypnea, no increased work of breathing.  Swabs obtained in triage are negative, and chest x-ray does not reveal evidence of cardiopulmonary abnormality.  He has no chest pain, orthopnea, dyspnea on exertion, lower extremity edema, JVD, not consistent with acute onset heart failure.  He has no chest pain, nausea, diaphoresis to suggest acute coronary syndrome.  He has no chest pain/pleurisy, shortness of breath, tachycardia, hypoxia, hemoptysis, history of PE or DVT to suggest pulmonary embolism.  Symptoms of productive cough with yellow sputum and nasal congestion most consistent with viral etiology.  We discussed symptomatic management and return precautions.  Patient understands and agrees with plan.  He was discharged in stable condition.   Patient's presentation is most consistent with acute complicated illness / injury requiring diagnostic workup.      FINAL CLINICAL IMPRESSION(S) / ED DIAGNOSES   Final diagnoses:  Viral URI with cough     Rx / DC Orders   ED Discharge  Orders     None        Note:  This document was prepared using Dragon voice recognition software and may include unintentional dictation errors.   Saed Hudlow E, PA-C 10/10/23 1352    Suzanne Kirsch, MD 10/10/23 [phone]

## 2023-10-11 ENCOUNTER — Encounter: Payer: Self-pay | Admitting: Internal Medicine

## 2023-10-11 LAB — SURGICAL PATHOLOGY

## 2023-10-18 ENCOUNTER — Other Ambulatory Visit: Payer: Self-pay | Admitting: Internal Medicine

## 2023-10-18 ENCOUNTER — Telehealth: Payer: Self-pay | Admitting: *Deleted

## 2023-10-18 NOTE — Progress Notes (Signed)
.  Transition Care Management Unsuccessful Follow-up Telephone Call  Date of discharge and from where:  [facility] 10/10/2023  Attempts:  1st Attempt  Reason for unsuccessful TCM follow-up call:  Left voice message

## 2023-10-27 ENCOUNTER — Other Ambulatory Visit: Payer: Self-pay | Admitting: Internal Medicine

## 2023-10-30 ENCOUNTER — Ambulatory Visit (INDEPENDENT_AMBULATORY_CARE_PROVIDER_SITE_OTHER): Payer: Medicare Other | Admitting: *Deleted

## 2023-10-30 VITALS — Ht 70.0 in | Wt 158.0 lb

## 2023-10-30 DIAGNOSIS — Z0000 Encounter for general adult medical examination without abnormal findings: Secondary | ICD-10-CM

## 2023-10-30 NOTE — Patient Instructions (Signed)
Jose Perry , Thank you for taking time to come for your Medicare Wellness Visit. I appreciate your ongoing commitment to your health goals. Please review the following plan we discussed and let me know if I can assist you in the future.   Referrals/Orders/Follow-Ups/Clinician Recommendations: None  This is a list of the screening recommended for you and due dates:  Health Maintenance  Topic Date Due   COVID-19 Vaccine (8 - 2024-25 season) 08/29/2023   Medicare Annual Wellness Visit  10/29/2024   Colon Cancer Screening  10/08/2028   DTaP/Tdap/Td vaccine (2 - Td or Tdap) 10/26/2031   Pneumonia Vaccine  Completed   Flu Shot  Completed   Zoster (Shingles) Vaccine  Completed   HPV Vaccine  Aged Out   Hepatitis C Screening  Discontinued    Advanced directives: (Declined) Advance directive discussed with you today. Even though you declined this today, please call our office should you change your mind, and we can give you the proper paperwork for you to fill out.  Next Medicare Annual Wellness Visit scheduled for next year: Yes 11/03/24 @ 1:00

## 2023-10-30 NOTE — Progress Notes (Signed)
Subjective:   Jose Perry is a 83 y.o. male who presents for Medicare Annual/Subsequent preventive examination.  Visit Complete: Virtual I connected with  Jose Perry on 10/30/23 by a audio enabled telemedicine application and verified that I am speaking with the correct person using two identifiers.This patient declined Interactive audio and Acupuncturist. Therefore the visit was completed with audio only.   Patient Location: Home  Provider Location: Office/Clinic  I discussed the limitations of evaluation and management by telemedicine. The patient expressed understanding and agreed to proceed.  Vital Signs: Because this visit was a virtual/telehealth visit, some criteria may be missing or patient reported. Any vitals not documented were not able to be obtained and vitals that have been documented are patient reported.  Patient Medicare AWV questionnaire was completed by the patient on 10/26/23; I have confirmed that all information answered by patient is correct and no changes since this date.  Cardiac Risk Factors include: advanced age (>62men, >25 women);hypertension;male gender     Objective:    Today's Vitals   10/30/23 1125  Weight: 158 lb (71.7 kg)  Height: 5\' 10"  (1.778 m)   Body mass index is 22.67 kg/m.     10/30/2023   11:33 AM 10/09/2023    9:01 AM 08/06/2023    1:18 PM 10/27/2022    1:03 PM 05/25/2022    5:29 PM 05/25/2022    4:37 PM 03/15/2022    1:18 AM  Advanced Directives  Does Patient Have a Medical Advance Directive? No No No No No No No  Does patient want to make changes to medical advance directive?    No - Patient declined     Would patient like information on creating a medical advance directive? No - Patient declined      No - Patient declined    Current Medications (verified) Outpatient Encounter Medications as of 10/30/2023  Medication Sig   albuterol (VENTOLIN HFA) 108 (90 Base) MCG/ACT inhaler TAKE 2 PUFFS BY MOUTH EVERY 6 HOURS  AS NEEDED   amLODipine (NORVASC) 2.5 MG tablet 1 TABLET TWICE A DAY   ASPIRIN 81 PO Take by mouth.   ferrous sulfate 325 (65 FE) MG EC tablet Take 325 mg by mouth daily.   finasteride (PROSCAR) 5 MG tablet Take 1 tablet (5 mg total) by mouth daily.   fluticasone (FLONASE) 50 MCG/ACT nasal spray Place 2 sprays into both nostrils as needed.   fluticasone furoate-vilanterol (BREO ELLIPTA) 200-25 MCG/ACT AEPB TAKE 1 PUFF BY MOUTH EVERY DAY   hydrALAZINE (APRESOLINE) 10 MG tablet Take 1 tablet (10 mg total) by mouth 3 (three) times daily.   latanoprost (XALATAN) 0.005 % ophthalmic solution ADMINISTER 1 DROP TO THE RIGHT EYE NIGHTLY.   omeprazole (PRILOSEC) 40 MG capsule TAKE 1 CAPSULE (40 MG TOTAL) BY MOUTH DAILY.   rosuvastatin (CRESTOR) 10 MG tablet TAKE 1 TABLET BY MOUTH EVERY DAY   tamsulosin (FLOMAX) 0.4 MG CAPS capsule Take 1 capsule (0.4 mg total) by mouth daily.   timolol (TIMOPTIC) 0.5 % ophthalmic solution Place 1 drop into the right eye 2 (two) times daily.   valsartan (DIOVAN) 320 MG tablet Take 1 tablet (320 mg total) by mouth at bedtime.   vitamin B-12 (CYANOCOBALAMIN) 1000 MCG tablet Take by mouth.   No facility-administered encounter medications on file as of 10/30/2023.    Allergies (verified) Brimonidine, Brimonidine tartrate, Brinzolamide-brimonidine, and Other   History: Past Medical History:  Diagnosis Date   Asthma    Dyspnea  Elevated blood pressure    Enlarged prostate    GERD (gastroesophageal reflux disease)    Glaucoma    History of adenomatous polyp of colon    Hx of colonic polyps    Hypertension    Leukopenia    has had an extensive w/up.     Liver hemangioma 01/24/2018   Thyroid goiter    Past Surgical History:  Procedure Laterality Date   BACK SURGERY  2002   ruptured disc   CATARACT EXTRACTION W/PHACO Right 06/30/2020   Procedure: CATARACT EXTRACTION PHACO AND INTRAOCULAR LENS PLACEMENT (IOC) RIGHT, Malyugin 7.19 01:30.8 7.9%;  Surgeon:  Lockie Mola, MD;  Location: [facility];  Service: Ophthalmology;  Laterality: Right;   COLONOSCOPY W/ POLYPECTOMY  04/24/2005, 07/06/2010, 07/08/2014   COLONOSCOPY WITH PROPOFOL N/A 02/01/2018   Procedure: COLONOSCOPY WITH PROPOFOL;  Surgeon: Scot Jun, MD;  Location: [facility];  Service: Endoscopy;  Laterality: N/A;   COLONOSCOPY WITH PROPOFOL N/A 10/09/2023   Procedure: COLONOSCOPY WITH PROPOFOL;  Surgeon: Toledo, Boykin Nearing, MD;  Location: [facility] END[facility];  Service: Gastroenterology;  Laterality: N/A;   ESOPHAGOGASTRODUODENOSCOPY  04/24/2005, 07/06/2010,   ESOPHAGOGASTRODUODENOSCOPY (EGD) WITH PROPOFOL N/A 05/10/2022   Procedure: ESOPHAGOGASTRODUODENOSCOPY (EGD) WITH PROPOFOL;  Surgeon: Toledo, Boykin Nearing, MD;  Location: [facility] END[facility];  Service: Gastroenterology;  Laterality: N/A;   EYE SURGERY  2009   relieve pressure from glaucoma   POLYPECTOMY  10/09/2023   Procedure: POLYPECTOMY;  Surgeon: Toledo, Boykin Nearing, MD;  Location: [facility];  Service: Gastroenterology;;   THYROID SURGERY  1968   goiter   Family History  Problem Relation Age of Onset   Heart disease Mother    Alcohol abuse Father    Hyperlipidemia Father    Stroke Father    Social History   Socioeconomic History   Marital status: Married    Spouse name: Not on file   Number of children: Not on file   Years of education: Not on file   Highest education level: Some college, no degree  Occupational History   Not on file  Tobacco Use   Smoking status: Never   Smokeless tobacco: Never  Vaping Use   Vaping status: Never Used  Substance and Sexual Activity   Alcohol use: No    Alcohol/week: 0.0 standard drinks of alcohol   Drug use: No   Sexual activity: Yes  Other Topics Concern   Not on file  Social History Narrative   married   Social Drivers of Corporate investment banker Strain: Low Risk  (10/30/2023)   Overall Financial Resource Strain (CARDIA)    Difficulty of Paying Living  Expenses: Not hard at all  Food Insecurity: No Food Insecurity (10/30/2023)   Hunger Vital Sign    Worried About Running Out of Food in the Last Year: Never true    Ran Out of Food in the Last Year: Never true  Transportation Needs: No Transportation Needs (10/30/2023)   PRAPARE - Administrator, Civil Service (Medical): No    Lack of Transportation (Non-Medical): No  Physical Activity: Sufficiently Active (10/30/2023)   Exercise Vital Sign    Days of Exercise per Week: 7 days    Minutes of Exercise per Session: 90 min  Stress: No Stress Concern Present (10/30/2023)   [employer] of Occupational Health - Occupational Stress Questionnaire    Feeling of Stress : Not at all  Social Connections: Moderately Isolated (10/30/2023)   Social Connection and Isolation Panel [NHANES]  Frequency of Communication with Friends and Family: Three times a week    Frequency of Social Gatherings with Friends and Family: Once a week    Attends Religious Services: Never    Database administrator or Organizations: No    Attends Engineer, structural: Not on file    Marital Status: Married    Tobacco Counseling Counseling given: Not Answered   Clinical Intake:  Pre-visit preparation completed: Yes  Pain : No/denies pain     BMI - recorded: 22.67 Nutritional Status: BMI of 19-24  Normal Nutritional Risks: None Diabetes: No  How often do you need to have someone help you when you read instructions, pamphlets, or other written materials from your doctor or pharmacy?: 1 - Never  Interpreter Needed?: No  Information entered by :: R. Ostin Mathey LPN   Activities of Daily Living    10/26/2023    5:35 PM  In your present state of health, do you have any difficulty performing the following activities:  Hearing? 0  Vision? 0  Difficulty concentrating or making decisions? 0  Walking or climbing stairs? 0  Dressing or bathing? 0  Doing errands, shopping? 0  Preparing Food and  eating ? N  Using the Toilet? N  In the past six months, have you accidently leaked urine? N  Do you have problems with loss of bowel control? N  Managing your Medications? N  Managing your Finances? N  Housekeeping or managing your Housekeeping? N    Patient Care Team: Dale Tallapoosa, MD as PCP - General (Internal Medicine) Debbe Odea, MD as PCP - Cardiology (Cardiology) [name], MD as Consulting Physician (Pulmonary Disease)  Indicate any recent Medical Services you may have received from other than [facility] providers in the past year (date may be approximate).     Assessment:   This is a routine wellness examination for Jose Perry.  Hearing/Vision screen Hearing Screening - Comments:: No issues Vision Screening - Comments:: glasses   Goals Addressed             This Visit's Progress    Patient Stated       Continue to exercise on a regular basis       Depression Screen    10/30/2023   11:30 AM 09/17/2023    9:42 AM 06/07/2023    2:07 PM 04/11/2023   11:59 AM 10/27/2022    1:07 PM 08/25/2022    1:41 PM 03/22/2022   11:07 AM  PHQ 2/9 Scores  PHQ - 2 Score 0 0 0 0 0 0 0  PHQ- 9 Score 0 0 0        Fall Risk    10/26/2023    5:35 PM 09/17/2023    9:41 AM 06/07/2023    2:07 PM 04/11/2023   11:59 AM 10/27/2022    1:07 PM  Fall Risk   Falls in the past year? 0 0 0 0 0  Number falls in past yr: 0 0 0 0 0  Injury with Fall? 0 0 0 0 0  Risk for fall due to : No Fall Risks No Fall Risks No Fall Risks No Fall Risks   Follow up Falls prevention discussed;Falls evaluation completed Falls evaluation completed Falls evaluation completed Falls evaluation completed Falls evaluation completed;Falls prevention discussed    MEDICARE RISK AT HOME: Medicare Risk at Home Any stairs in or around the home?: (Patient-Rptd) Yes If so, are there any without handrails?: (Patient-Rptd) No Home free of loose  throw rugs in walkways, pet beds, electrical cords, etc?:  (Patient-Rptd) Yes Adequate lighting in your home to reduce risk of falls?: (Patient-Rptd) Yes Life alert?: (Patient-Rptd) No Use of a cane, walker or w/c?: (Patient-Rptd) No Grab bars in the bathroom?: (Patient-Rptd) No Shower chair or bench in shower?: (Patient-Rptd) Yes Elevated toilet seat or a handicapped toilet?: (Patient-Rptd) No  Cognitive Function:    08/23/2018    9:30 AM 08/20/2017    4:38 PM 08/18/2016    4:27 PM  MMSE - Mini Mental State Exam  Orientation to time 5 5 5   Orientation to Place 5 5 5   Registration 3 3 3   Attention/ Calculation 5 5 5   Recall 3 3 3   Language- name 2 objects 2 2 2   Language- repeat 1 1 1   Language- follow 3 step command 3 3 3   Language- read & follow direction 1 1 1   Write a sentence 1 1 1   Copy design 1 1 1   Total score 30 30 30         10/30/2023   11:33 AM 10/27/2022    1:15 PM 09/29/2020    1:30 PM 09/26/2019    8:47 AM 08/18/2016    4:31 PM  6CIT Screen  What Year? 0 points 0 points 0 points 0 points 0 points  What month? 0 points 0 points 0 points 0 points 0 points  What time? 0 points 0 points 0 points 0 points 0 points  Count back from 20 0 points  0 points 0 points 0 points  Months in reverse 0 points 0 points 0 points 0 points 0 points  Repeat phrase 0 points  0 points 0 points   Total Score 0 points  0 points 0 points     Immunizations Immunization History  Administered Date(s) Administered   Fluad Quad(high Dose 65+) 08/27/2020, 07/11/2021, 06/16/2022   Influenza, High Dose Seasonal PF 06/13/2016, 08/20/2017, 07/31/2018, 08/22/2019, 06/15/2023   Influenza,inj,Quad PF,6+ Mos 07/20/2015   PFIZER Comirnaty(Gray Top)Covid-19 Tri-Sucrose Vaccine 02/26/2021, 06/28/2022   PFIZER(Purple Top)SARS-COV-2 Vaccination 10/30/2019, 11/20/2019, 07/05/2020   Pfizer Covid-19 Vaccine Bivalent Booster 14yrs & up 07/01/2021   Pfizer(Comirnaty)Fall Seasonal Vaccine 12 years and older 07/04/2023   Pneumococcal Conjugate-13 06/10/2015    Pneumococcal Polysaccharide-23 06/30/2013   Respiratory Syncytial Virus Vaccine,Recomb Aduvanted(Arexvy) 10/07/2022   Tdap 10/25/2021   Zoster Recombinant(Shingrix) 07/06/2018, 08/15/2021   Zoster, Live 06/30/2013    TDAP status: Up to date  Flu Vaccine status: Up to date  Pneumococcal vaccine status: Up to date  Covid-19 vaccine status: Completed vaccines  Qualifies for Shingles Vaccine? Yes   Zostavax completed Yes   Shingrix Completed?: Yes  Screening Tests Health Maintenance  Topic Date Due   COVID-19 Vaccine (8 - 2024-25 season) 08/29/2023   Medicare Annual Wellness (AWV)  10/28/2023   DTaP/Tdap/Td (2 - Td or Tdap) 10/26/2031   Pneumonia Vaccine 31+ Years old  Completed   INFLUENZA VACCINE  Completed   Zoster Vaccines- Shingrix  Completed   HPV VACCINES  Aged Out   Hepatitis C Screening  Discontinued    Health Maintenance  Health Maintenance Due  Topic Date Due   COVID-19 Vaccine (8 - 2024-25 season) 08/29/2023   Medicare Annual Wellness (AWV)  10/28/2023    Colorectal cancer screening: No longer required.  Had last colonoscopy 09/2023  Lung Cancer Screening: (Low Dose CT Chest recommended if Age 67-80 years, 20 pack-year currently smoking OR have quit w/in 15years.) does not qualify.    Additional Screening:  Hepatitis C Screening: does not qualify; Completed 04/2018  Vision Screening: Recommended annual ophthalmology exams for early detection of glaucoma and other disorders of the eye. Is the patient up to date with their annual eye exam?  Yes  Who is the provider or what is the name of the office in which the patient attends annual eye exams? [facility] If pt is not established with a provider, would they like to be referred to a provider to establish care? No .   Dental Screening: Recommended annual dental exams for proper oral hygiene   Community Resource Referral / Chronic Care Management: CRR required this visit?  No   CCM required this  visit?  No     Plan:     I have personally reviewed and noted the following in the patient's chart:   Medical and social history Use of alcohol, tobacco or illicit drugs  Current medications and supplements including opioid prescriptions. Patient is not currently taking opioid prescriptions. Functional ability and status Nutritional status Physical activity Advanced directives List of other physicians Hospitalizations, surgeries, and ER visits in previous 12 months Vitals Screenings to include cognitive, depression, and falls Referrals and appointments  In addition, I have reviewed and discussed with patient certain preventive protocols, quality metrics, and best practice recommendations. A written personalized care plan for preventive services as well as general preventive health recommendations were provided to patient.     Sydell Axon, LPN   05/19/9146   After Visit Summary: (MyChart) Due to this being a telephonic visit, the after visit summary with patients personalized plan was offered to patient via MyChart   Nurse Notes: None

## 2023-11-22 ENCOUNTER — Encounter: Payer: Self-pay | Admitting: Internal Medicine

## 2023-11-22 ENCOUNTER — Ambulatory Visit (INDEPENDENT_AMBULATORY_CARE_PROVIDER_SITE_OTHER): Payer: Medicare Other | Admitting: Internal Medicine

## 2023-11-22 VITALS — BP 132/78 | HR 56 | Temp 97.5°F | Ht 70.0 in | Wt 152.8 lb

## 2023-11-22 DIAGNOSIS — R0602 Shortness of breath: Secondary | ICD-10-CM

## 2023-11-22 DIAGNOSIS — K219 Gastro-esophageal reflux disease without esophagitis: Secondary | ICD-10-CM

## 2023-11-22 DIAGNOSIS — E041 Nontoxic single thyroid nodule: Secondary | ICD-10-CM

## 2023-11-22 DIAGNOSIS — I10 Essential (primary) hypertension: Secondary | ICD-10-CM

## 2023-11-22 DIAGNOSIS — D696 Thrombocytopenia, unspecified: Secondary | ICD-10-CM | POA: Diagnosis not present

## 2023-11-22 DIAGNOSIS — E785 Hyperlipidemia, unspecified: Secondary | ICD-10-CM | POA: Diagnosis not present

## 2023-11-22 MED ORDER — AMLODIPINE BESYLATE 2.5 MG PO TABS: 2.5000 mg | ORAL_TABLET | Freq: Two times a day (BID) | ORAL | 1 refills | Status: DC

## 2023-11-22 MED ORDER — ROSUVASTATIN CALCIUM 10 MG PO TABS: 10.0000 mg | ORAL_TABLET | Freq: Every day | ORAL | 1 refills | Status: DC

## 2023-11-22 NOTE — Progress Notes (Signed)
Subjective:    Patient ID: Jose Perry, male    DOB: [birth year], 83 y.o.   MRN: [id number]  Patient here for  Chief Complaint  Patient presents with   Hypertension    HPI Here for a scheduled follow up - follow up regarding hypertension and asthma. Had f/u with Dr Jayme Cloud 09/13/23 - on breo. Has rescue inhaler. Breathing stable. Was started on hydralazine 10mg  tid last visit. Also taking diovan and amlodipine 2.5mg  bid. Unable to increase dose of amlodipine due to increased swelling. Avoid beta blockers due to average pulse rate 50-60. Wants to avoid diuretics. Blood pressure still elevated in am. Taking his medication regularly. Does take his first doses around 9-10:30 in am and evening doses around 4:30. Breathing overall stable. No chest pain. Stays active. No abdominal pain or bowel change.    Past Medical History:  Diagnosis Date   Asthma    Dyspnea    Elevated blood pressure    Enlarged prostate    GERD (gastroesophageal reflux disease)    Glaucoma    History of adenomatous polyp of colon    Hx of colonic polyps    Hypertension    Leukopenia    has had an extensive w/up.     Liver hemangioma 01/24/2018   Thyroid goiter    Past Surgical History:  Procedure Laterality Date   BACK SURGERY  2002   ruptured disc   CATARACT EXTRACTION W/PHACO Right 06/30/2020   Procedure: CATARACT EXTRACTION PHACO AND INTRAOCULAR LENS PLACEMENT (IOC) RIGHT, Malyugin 7.19 01:30.8 7.9%;  Surgeon: Lockie Mola, MD;  Location: [facility];  Service: Ophthalmology;  Laterality: Right;   COLONOSCOPY W/ POLYPECTOMY  04/24/2005, 07/06/2010, 07/08/2014   COLONOSCOPY WITH PROPOFOL N/A 02/01/2018   Procedure: COLONOSCOPY WITH PROPOFOL;  Surgeon: Scot Jun, MD;  Location: [facility] END[facility];  Service: Endoscopy;  Laterality: N/A;   COLONOSCOPY WITH PROPOFOL N/A 10/09/2023   Procedure: COLONOSCOPY WITH PROPOFOL;  Surgeon: Toledo, Boykin Nearing, MD;  Location: [facility] ENDOSCOPY;  Service:  Gastroenterology;  Laterality: N/A;   ESOPHAGOGASTRODUODENOSCOPY  04/24/2005, 07/06/2010,   ESOPHAGOGASTRODUODENOSCOPY (EGD) WITH PROPOFOL N/A 05/10/2022   Procedure: ESOPHAGOGASTRODUODENOSCOPY (EGD) WITH PROPOFOL;  Surgeon: Toledo, Boykin Nearing, MD;  Location: [facility] ENDOSCOPY;  Service: Gastroenterology;  Laterality: N/A;   EYE SURGERY  2009   relieve pressure from glaucoma   POLYPECTOMY  10/09/2023   Procedure: POLYPECTOMY;  Surgeon: Toledo, Boykin Nearing, MD;  Location: [facility] ENDOSCOPY;  Service: Gastroenterology;;   THYROID SURGERY  1968   goiter   Family History  Problem Relation Age of Onset   Heart disease Mother    Alcohol abuse Father    Hyperlipidemia Father    Stroke Father    Social History   Socioeconomic History   Marital status: Married    Spouse name: Not on file   Number of children: Not on file   Years of education: Not on file   Highest education level: Some college, no degree  Occupational History   Not on file  Tobacco Use   Smoking status: Never   Smokeless tobacco: Never  Vaping Use   Vaping status: Never Used  Substance and Sexual Activity   Alcohol use: No    Alcohol/week: 0.0 standard drinks of alcohol   Drug use: No   Sexual activity: Yes  Other Topics Concern   Not on file  Social History Narrative   married   Social Drivers of Health   Financial Resource Strain: Low Risk  (10/30/2023)  Overall Financial Resource Strain (CARDIA)    Difficulty of Paying Living Expenses: Not hard at all  Food Insecurity: No Food Insecurity (10/30/2023)   Hunger Vital Sign    Worried About Running Out of Food in the Last Year: Never true    Ran Out of Food in the Last Year: Never true  Transportation Needs: No Transportation Needs (10/30/2023)   PRAPARE - Administrator, Civil Service (Medical): No    Lack of Transportation (Non-Medical): No  Physical Activity: Sufficiently Active (10/30/2023)   Exercise Vital Sign    Days of Exercise per Week: 7 days     Minutes of Exercise per Session: 90 min  Stress: No Stress Concern Present (10/30/2023)   [employer] of Occupational Health - Occupational Stress Questionnaire    Feeling of Stress : Not at all  Social Connections: Moderately Isolated (10/30/2023)   Social Connection and Isolation Panel [NHANES]    Frequency of Communication with Friends and Family: Three times a week    Frequency of Social Gatherings with Friends and Family: Once a week    Attends Religious Services: Never    Database administrator or Organizations: No    Attends Engineer, structural: Not on file    Marital Status: Married     Review of Systems  Constitutional:  Negative for appetite change and unexpected weight change.  HENT:  Negative for congestion and sinus pressure.   Respiratory:  Negative for cough and chest tightness.        Breathing stable.   Cardiovascular:  Negative for chest pain, palpitations and leg swelling.  Gastrointestinal:  Negative for abdominal pain, diarrhea, nausea and vomiting.  Genitourinary:  Negative for difficulty urinating and dysuria.  Musculoskeletal:  Negative for joint swelling and myalgias.  Skin:  Negative for color change and rash.  Neurological:  Negative for dizziness and headaches.  Psychiatric/Behavioral:  Negative for agitation and dysphoric mood.        Objective:     BP 132/78   Pulse (!) 56   Temp (!) 97.5 F (36.4 C) (Oral)   Wt 152 lb 12.8 oz (69.3 kg)   SpO2 99%   BMI 21.92 kg/m  Wt Readings from Last 3 Encounters:  11/22/23 152 lb 12.8 oz (69.3 kg)  10/30/23 158 lb (71.7 kg)  10/10/23 148 lb (67.1 kg)    Physical Exam Vitals reviewed.  Constitutional:      General: He is not in acute distress.    Appearance: Normal appearance. He is well-developed.  HENT:     Head: Normocephalic and atraumatic.     Right Ear: External ear normal.     Left Ear: External ear normal.     Mouth/Throat:     Pharynx: No oropharyngeal exudate or  posterior oropharyngeal erythema.  Eyes:     General: No scleral icterus.       Right eye: No discharge.        Left eye: No discharge.     Conjunctiva/sclera: Conjunctivae normal.  Cardiovascular:     Rate and Rhythm: Normal rate and regular rhythm.  Pulmonary:     Effort: Pulmonary effort is normal. No respiratory distress.     Breath sounds: Normal breath sounds.  Abdominal:     General: Bowel sounds are normal.     Palpations: Abdomen is soft.     Tenderness: There is no abdominal tenderness.  Musculoskeletal:        General: No swelling or  tenderness.     Cervical back: Neck supple. No tenderness.  Lymphadenopathy:     Cervical: No cervical adenopathy.  Skin:    Findings: No erythema or rash.  Neurological:     Mental Status: He is alert.  Psychiatric:        Mood and Affect: Mood normal.        Behavior: Behavior normal.         Outpatient Encounter Medications as of 11/22/2023  Medication Sig   albuterol (VENTOLIN HFA) 108 (90 Base) MCG/ACT inhaler TAKE 2 PUFFS BY MOUTH EVERY 6 HOURS AS NEEDED   ASPIRIN 81 PO Take by mouth.   ferrous sulfate 325 (65 FE) MG EC tablet Take 325 mg by mouth daily.   finasteride (PROSCAR) 5 MG tablet Take 1 tablet (5 mg total) by mouth daily.   fluticasone (FLONASE) 50 MCG/ACT nasal spray Place 2 sprays into both nostrils as needed.   fluticasone furoate-vilanterol (BREO ELLIPTA) 200-25 MCG/ACT AEPB TAKE 1 PUFF BY MOUTH EVERY DAY   hydrALAZINE (APRESOLINE) 10 MG tablet Take 1 tablet (10 mg total) by mouth 3 (three) times daily.   latanoprost (XALATAN) 0.005 % ophthalmic solution ADMINISTER 1 DROP TO THE RIGHT EYE NIGHTLY.   omeprazole (PRILOSEC) 40 MG capsule TAKE 1 CAPSULE (40 MG TOTAL) BY MOUTH DAILY.   tamsulosin (FLOMAX) 0.4 MG CAPS capsule Take 1 capsule (0.4 mg total) by mouth daily.   timolol (TIMOPTIC) 0.5 % ophthalmic solution Place 1 drop into the right eye 2 (two) times daily.   valsartan (DIOVAN) 320 MG tablet Take 1 tablet  (320 mg total) by mouth at bedtime.   vitamin B-12 (CYANOCOBALAMIN) 1000 MCG tablet Take by mouth.   amLODipine (NORVASC) 2.5 MG tablet Take 1 tablet (2.5 mg total) by mouth 2 (two) times daily.   rosuvastatin (CRESTOR) 10 MG tablet Take 1 tablet (10 mg total) by mouth daily.   [DISCONTINUED] amLODipine (NORVASC) 2.5 MG tablet 1 TABLET TWICE A DAY   [DISCONTINUED] rosuvastatin (CRESTOR) 10 MG tablet TAKE 1 TABLET BY MOUTH EVERY DAY   No facility-administered encounter medications on file as of 11/22/2023.     Lab Results  Component Value Date   WBC 2.9 (L) 09/11/2023   HGB 13.4 09/11/2023   HCT 41.3 09/11/2023   PLT 140.0 (L) 09/11/2023   GLUCOSE 89 09/11/2023   CHOL 170 09/11/2023   TRIG 42.0 09/11/2023   HDL 77.00 09/11/2023   LDLCALC 85 09/11/2023   ALT 22 09/11/2023   AST 32 09/11/2023   NA 137 09/11/2023   K 4.3 09/11/2023   CL 101 09/11/2023   CREATININE 1.29 09/11/2023   BUN 14 09/11/2023   CO2 31 09/11/2023   TSH 1.83 09/11/2023   PSA 2.86 07/11/2021    DG Chest 2 View Result Date: 10/10/2023 CLINICAL DATA:  Productive cough EXAM: CHEST - 2 VIEW COMPARISON:  05/25/2022 chest radiograph. FINDINGS: Stable cardiomediastinal silhouette with normal heart size. No pneumothorax. No pleural effusion. Lungs appear clear, with no acute consolidative airspace disease and no pulmonary edema. IMPRESSION: No active cardiopulmonary disease. Electronically Signed   By: Delbert Phenix [name]   On: 10/10/2023 13:26       Assessment & Plan:  Shortness of breath Assessment & Plan: Has seen cardiology with w/up as outlined.  Had CTA - mild non obstructive CAD.  Has seen pulmonary.  Breathing overall stable.    Primary hypertension Assessment & Plan: Currently on diovan 320mg  q day.  Taking amlodipine 2.5mg  bid  at times as outlined. Had swelling with increased dose of amlodipine. Responds well to amlodipine, but cannot tolerate increasing the dose.  Discussed medication options. He wants to  avoid diuretics due to increased urinary frequency. Have discussed trial of spironolactone. Still would like to avoid due to above. Avoid beta blockers due to pulse rate average 50-60. Started hydralazine 10mg  tid last visit. Still with increased am pressures. Have him spread out dosing of his medication. Blood pressure looks good today. Follow pressures.    Orders: -     CBC with Differential/Platelet; Future -     Basic metabolic panel; Future  Thrombocytopenia (HCC) Assessment & Plan: Follow cbc.  Overall stable.   Orders: -     CBC with Differential/Platelet; Future  Hyperlipidemia, unspecified hyperlipidemia type -     Lipid panel; Future -     Hepatic function panel; Future  Thyroid nodule Assessment & Plan:  Followed by endocrinology - 10/2021.  Recommended f/u thyroid ultrasound in one year.  Ultrasound 10/2022 - stable.  Recommended f/u in 2 years. Follow tsh.    Gastroesophageal reflux disease, unspecified whether esophagitis present Assessment & Plan: On prilosec. No significant upper symptoms reported. Follow.    Other orders -     amLODIPine Besylate; Take 1 tablet (2.5 mg total) by mouth 2 (two) times daily.  Dispense: 180 tablet; Refill: 1 -     Rosuvastatin Calcium; Take 1 tablet (10 mg total) by mouth daily.  Dispense: 90 tablet; Refill: 1     Dale Vintondale, MD

## 2023-11-26 NOTE — Assessment & Plan Note (Signed)
Follow cbc.  Overall stable.

## 2023-11-26 NOTE — Assessment & Plan Note (Signed)
Currently on diovan 320mg  q day.  Taking amlodipine 2.5mg  bid at times as outlined. Had swelling with increased dose of amlodipine. Responds well to amlodipine, but cannot tolerate increasing the dose.  Discussed medication options. He wants to avoid diuretics due to increased urinary frequency. Have discussed trial of spironolactone. Still would like to avoid due to above. Avoid beta blockers due to pulse rate average 50-60. Started hydralazine 10mg  tid last visit. Still with increased am pressures. Have him spread out dosing of his medication. Blood pressure looks good today. Follow pressures.

## 2023-11-26 NOTE — Assessment & Plan Note (Signed)
Has seen cardiology with w/up as outlined.  Had CTA - mild non obstructive CAD.  Has seen pulmonary.  Breathing overall stable.

## 2023-11-26 NOTE — Assessment & Plan Note (Signed)
On prilosec. No significant upper symptoms reported. Follow.

## 2023-11-26 NOTE — Assessment & Plan Note (Signed)
Followed by endocrinology - 10/2021.  Recommended f/u thyroid ultrasound in one year.  Ultrasound 10/2022 - stable.  Recommended f/u in 2 years. Follow tsh.

## 2023-11-30 ENCOUNTER — Encounter: Payer: Self-pay | Admitting: Intensive Care

## 2023-11-30 ENCOUNTER — Other Ambulatory Visit: Payer: Self-pay

## 2023-11-30 ENCOUNTER — Emergency Department
Admission: EM | Admit: 2023-11-30 | Discharge: 2023-11-30 | Disposition: A | Discharge status: Home / Self Care | Payer: Medicare Other | Attending: Emergency Medicine | Admitting: Emergency Medicine

## 2023-11-30 DIAGNOSIS — I10 Essential (primary) hypertension: Secondary | ICD-10-CM | POA: Diagnosis not present

## 2023-11-30 DIAGNOSIS — R040 Epistaxis: Secondary | ICD-10-CM | POA: Diagnosis not present

## 2023-11-30 LAB — CBC WITH DIFFERENTIAL/PLATELET
Abs Immature Granulocytes: 0.01 10*3/uL (ref 0.00–0.07)
Basophils Absolute: 0 10*3/uL (ref 0.0–0.1)
Basophils Relative: 1 %
Eosinophils Absolute: 0.1 10*3/uL (ref 0.0–0.5)
Eosinophils Relative: 5 %
HCT: 43.7 % (ref 39.0–52.0)
Hemoglobin: 13.9 g/dL (ref 13.0–17.0)
Immature Granulocytes: 0 %
Lymphocytes Relative: 30 %
Lymphs Abs: 0.9 10*3/uL (ref 0.7–4.0)
MCH: 27.6 pg (ref 26.0–34.0)
MCHC: 31.8 g/dL (ref 30.0–36.0)
MCV: 86.9 fL (ref 80.0–100.0)
Monocytes Absolute: 0.3 10*3/uL (ref 0.1–1.0)
Monocytes Relative: 9 %
Neutro Abs: 1.6 10*3/uL — ABNORMAL LOW (ref 1.7–7.7)
Neutrophils Relative %: 55 %
Platelets: 141 10*3/uL — ABNORMAL LOW (ref 150–400)
RBC: 5.03 MIL/uL (ref 4.22–5.81)
RDW: 13.8 % (ref 11.5–15.5)
WBC: 2.9 10*3/uL — ABNORMAL LOW (ref 4.0–10.5)
nRBC: 0 % (ref 0.0–0.2)

## 2023-11-30 LAB — COMPREHENSIVE METABOLIC PANEL
ALT: 22 U/L (ref 0–44)
AST: 29 U/L (ref 15–41)
Albumin: 4.5 g/dL (ref 3.5–5.0)
Alkaline Phosphatase: 76 U/L (ref 38–126)
Anion gap: 11 (ref 5–15)
BUN: 16 mg/dL (ref 8–23)
CO2: 25 mmol/L (ref 22–32)
Calcium: 10.3 mg/dL (ref 8.9–10.3)
Chloride: 101 mmol/L (ref 98–111)
Creatinine, Ser: 1.21 mg/dL (ref 0.61–1.24)
GFR, Estimated: >60 mL/min (ref >60)
Glucose, Bld: 85 mg/dL (ref 70–99)
Potassium: 4.1 mmol/L (ref 3.5–5.1)
Sodium: 137 mmol/L (ref 135–145)
Total Bilirubin: 0.9 mg/dL (ref 0.0–1.2)
Total Protein: 7.4 g/dL (ref 6.5–8.1)

## 2023-11-30 LAB — PROTIME-INR
INR: 1 (ref 0.8–1.2)
Prothrombin Time: 13.1 s (ref 11.4–15.2)

## 2023-11-30 LAB — APTT: aPTT: 33 s (ref 24–36)

## 2023-11-30 MED ORDER — OXYMETAZOLINE HCL 0.05 % NA SOLN
1.0000 | Freq: Once | NASAL | Status: AC
  Administered 2023-11-30: 1 via NASAL
  Filled 2023-11-30: qty 30

## 2023-11-30 MED ORDER — TRANEXAMIC ACID FOR EPISTAXIS
500.0000 mg | Freq: Once | TOPICAL | Status: AC
  Administered 2023-11-30: 500 mg via TOPICAL
  Filled 2023-11-30: qty 10

## 2023-11-30 NOTE — ED Notes (Signed)
Pt AOX4, NAD noted. No bleeding noted at this time, pressure clamp not applied upon this assessment.

## 2023-11-30 NOTE — ED Provider Notes (Signed)
[facility] Provider Note    Event Date/Time   First MD Initiated Contact with Patient 11/30/23 1012     (approximate)   History   Epistaxis   HPI  Jose Perry is a 83 y.o. male with hypertension on blood thinners who comes in with concerns for bleeding from his right nostril.  Patient reports that he blew his nose about a week ago and he had a little bit of blood in his nose at that time but today to started spontaneously.  He denies any gushing out but has a steady stream of blood therefore he came into the emergency room to be evaluated.  Does report some higher blood pressures than normal.  Physical Exam   Triage Vital Signs: ED Triage Vitals  Encounter Vitals Group     BP 11/30/23 0936 (!) 186/105     Systolic BP Percentile --      Diastolic BP Percentile --      Pulse Rate 11/30/23 0936 (!) 54     Resp 11/30/23 0936 18     Temp 11/30/23 0936 97.7 F (36.5 C)     Temp Source 11/30/23 0936 Oral     SpO2 11/30/23 0936 97 %     Weight 11/30/23 0938 150 lb (68 kg)     Height 11/30/23 0938 5\' 10"  (1.778 m)     Head Circumference --      Peak Flow --      Pain Score 11/30/23 0938 0     Pain Loc --      Pain Education --      Exclude from Growth Chart --     Most recent vital signs: Vitals:   11/30/23 0936  BP: (!) 186/105  Pulse: (!) 54  Resp: 18  Temp: 97.7 F (36.5 C)  SpO2: 97%     General: Awake, no distress.  CV:  Good peripheral perfusion.  Resp:  Normal effort.  Abd:  No distention.  Other:  No blood noted in the left nostril.  Right nostril with tissue noted inside of it.   ED Results / Procedures / Treatments   Labs (all labs ordered are listed, but only abnormal results are displayed) Labs Reviewed  CBC WITH DIFFERENTIAL/PLATELET - Abnormal; Notable for the following components:      Result Value   WBC 2.9 (*)    Platelets 141 (*)    Neutro Abs 1.6 (*)    All other components within normal limits   COMPREHENSIVE METABOLIC PANEL  PROTIME-INR  APTT      PROCEDURES:  Critical Care performed: No  Epistaxis Management  Date/Time: 11/30/2023 11:52 AM  Performed by: Concha Se, MD Authorized by: Concha Se, MD   Consent:    Consent obtained:  Verbal   Consent given by:  Patient   Risks, benefits, and alternatives were discussed: yes     Risks discussed:  Bleeding, infection, nasal injury and pain   Alternatives discussed:  No treatment Universal protocol:    Patient identity confirmed:  Verbally with patient Anesthesia:    Anesthesia method:  None Procedure details:    Treatment site:  R anterior Post-procedure details:    Assessment:  Bleeding stopped   Procedure completion:  Tolerated well, no immediate complications Comments:     TXA and afrin soaked gauze applied to the right nostril     MEDICATIONS ORDERED IN ED: Medications - No data to display   IMPRESSION / MDM /  ASSESSMENT AND PLAN / ED COURSE  I reviewed the triage vital signs and the nursing notes.   Patient's presentation is most consistent with acute presentation with potential threat to life or bodily function.   HR slightly bradycardiac but similar to priot on visit 2/13. No syncope. Prior EKG on 10/28 showed sinus bradycardia.   Slightly hypertensive but patient states he just took medications before coming in.  Discussed following this up outpatient with his PCP after nosebleeds resolved as he may need additional adjustment but he states that in the afternoons his blood pressure goes down to the 110s therefore I would be hesitant to adding another agent due to the risk of lowering blood pressure too much  Differential includes epistaxis secondary to nasal congestion, dry nasal passages, coagulopathy  Coags are normal CBC shows stable white count hemoglobin stable platelets are similar to prior.  CMP reassuring  TXA Afrin soaked gauze resolved some bleeding.  Patient was monitored  afterwards and bleeding did not restart.  Provided patient Afrin, clamp and discussed how to use.  We discussed trying to limit Afrin to use due to risk for elevated blood pressures he expressed understanding.  Patient reports already having an ENT doctor to call and we will make a follow-up for next week.  We discussed return precautions but at this time after discussion and resolution of bleeding have opted to hold off on packing.    FINAL CLINICAL IMPRESSION(S) / ED DIAGNOSES   Final diagnoses:  Epistaxis     Rx / DC Orders   ED Discharge Orders     None        Note:  This document was prepared using Dragon voice recognition software and may include unintentional dictation errors.   Concha Se, MD 11/30/23 1155

## 2023-11-30 NOTE — Discharge Instructions (Addendum)
Please call your ENT doctor to make an appointment for next week.  Also call your primary doctor to make an appointment to recheck your blood pressure.  If the bleeding restarts and then use the Afrin and the nasal clamp for 15 minutes at the bleeding has not stopped and return to the ER.  Use a humidifer at nighttime and do not blow your nose.  Return to the ER for any other concerns

## 2023-11-30 NOTE — ED Triage Notes (Addendum)
Patient reports nose bleed X1 week ago for a few minutes and then subsided. Reports traces of blood from nose when awakening since the nose bleed last week.  This morning the right side of his nose started bleeding. Patient has tissue in right nostril at this time and bleeding is controlled.  Takes aspirin daily.   Denies headache, vision changes, or chest pain

## 2023-12-04 DIAGNOSIS — R040 Epistaxis: Secondary | ICD-10-CM | POA: Diagnosis not present

## 2023-12-13 ENCOUNTER — Ambulatory Visit (INDEPENDENT_AMBULATORY_CARE_PROVIDER_SITE_OTHER): Payer: Medicare Other | Admitting: Pulmonary Disease

## 2023-12-13 ENCOUNTER — Encounter: Payer: Self-pay | Admitting: Pulmonary Disease

## 2023-12-13 VITALS — BP 154/76 | HR 68 | Temp 97.6°F | Ht 70.0 in | Wt 153.8 lb

## 2023-12-13 DIAGNOSIS — J4550 Severe persistent asthma, uncomplicated: Secondary | ICD-10-CM

## 2023-12-13 DIAGNOSIS — R040 Epistaxis: Secondary | ICD-10-CM

## 2023-12-13 DIAGNOSIS — R0602 Shortness of breath: Secondary | ICD-10-CM | POA: Diagnosis not present

## 2023-12-13 MED ORDER — TRELEGY ELLIPTA 200-62.5-25 MCG/ACT IN AEPB: 1.0000 | INHALATION_SPRAY | Freq: Every day | RESPIRATORY_TRACT | 0 refills | Status: DC

## 2023-12-13 NOTE — Progress Notes (Signed)
 Subjective:    Patient ID: Jose Perry, male    DOB: 04/30/1941, 83 y.o.   MRN: [id number]  Patient Care Team: Dale [place], MD as PCP - General (Internal Medicine) Debbe Odea, MD as PCP - Cardiology (Cardiology) [name], MD as Consulting Physician (Pulmonary Disease)  Chief Complaint  Patient presents with   Follow-up    Shortness of breath on exertion. No cough or wheezing.     BACKGROUND/INTERVAL: Patient is an 83 year old lifelong never smoker with medical history as noted below, who presents for follow-up of shortness of breath.  Patient was last seen on 30 July 2023.  He has asthma with eosinophilic and allergic phenotype.  He has poor understanding of his disease process. Nitric oxide on 15 June 2023 was 39 ppb.   HPI Discussed the use of AI scribe software for clinical note transcription with the patient, who gave verbal consent to proceed.  History of Present Illness   Jose Perry is an 83 year old male with asthma who presents with persistent chest tightness and easy fatigability.  He experiences persistent chest tightness and easy fatigability without wheezing. He describes his chest as never feeling 'loose' and gets winded easily. He is currently using Breo for asthma management.  He does not endorse any fevers, chills or sweats.  No cough.  He is not wheezing noted.  He has a history of nasal issues, including a recent episode of epistaxis that required hospital attention. He regularly sees an ear, nose, and throat specialist who cauterized a nasal vessel. He noticed traces of bleeding today, indicating that the procedure might need to be repeated.     DATA 07/11/2018 PFTs: FEV1 0.77 L or 31% predicted, FVC 1.52 L or 47% predicted, FEV1/FVC 51%, there was a 40% net change on FEV1 postbronchodilator.  There was air trapping but no hyperinflation noted on lung volumes.  Consistent with severe obstructive airways disease with significant  bronchodilator response 05/18/2022 coronary CTA: Lung windows nonrevealing. 05/25/2022 chest x-ray PA and lateral: Hyperinflation otherwise no abnormalities. Review of Systems A 10 point review of systems was performed and it is as noted above otherwise negative. 08/03/2022 nitric oxide: 57 ppb 08/25/2022 alpha 1: Phenotype MM, level 124 08/25/2022 IgE,Allergen panel/eosinophils: IgE 325, multiple allergens identified, no eosinophilia 09/12/2022 PFTs: FEV1 1.28 L or 44% predicted, FVC 1.93 L or 47% predicted, FEV1/FVC 66%, no significant bronchodilator response.  Pseudo restriction may be on the basis of air trapping. 06/06/2023 CBC: 900 eosinophils noted. 06/15/2023 nitirc oxide:39 ppb 07/30/2023 nitric oxide: 25 ppb  Review of Systems A 10 point review of systems was performed and it is as noted above otherwise negative.   Patient Active Problem List   Diagnosis Date Noted   Epistaxis 09/17/2023   Swelling of lower extremity 04/13/2023   Allergic rhinitis 02/13/2023   Otitis externa 02/13/2023   Diarrhea 10/30/2021   Iron deficiency 05/05/2020   Hyperplasia of prostate with lower urinary tract symptoms (LUTS) 07/10/2018   Exotropia 07/10/2018   Blepharoconjunctivitis 07/10/2018   Primary open angle glaucoma (POAG) 06/28/2018   B12 deficiency 05/14/2018   Liver hemangioma 01/24/2018   Thrombocytopenia (HCC) 12/19/2017   Change in stool 10/12/2017   Asthma 08/30/2017   Urinary urgency 05/23/2017   Thyroid nodule 06/13/2016   Primary open angle glaucoma (POAG) of right eye, moderate stage 04/10/2016   Shortness of breath 08/30/2015   Bruit 06/14/2015   Health care maintenance 12/13/2014   Glaucoma 09/21/2014   Goiter 06/28/2014  Hypertension 06/28/2014   Leukopenia 06/28/2014   History of colonic polyps 06/28/2014   Enlarged prostate 06/28/2014   GERD (gastroesophageal reflux disease) 06/28/2014   History of adenomatous polyp of colon 06/26/2014   Gastrointestinal ulcer  due to Helicobacter pylori 06/26/2014   Male erectile dysfunction, unspecified 07/24/2012   Incomplete emptying of bladder 07/24/2012   Elevated prostate specific antigen (PSA) 07/24/2012   Benign localized hyperplasia of prostate with urinary obstruction 07/24/2012    Social History   Tobacco Use   Smoking status: Never   Smokeless tobacco: Never  Substance Use Topics   Alcohol use: No    Alcohol/week: 0.0 standard drinks of alcohol    Allergies  Allergen Reactions   Brimonidine     Other reaction(s): Eye Redness   Brimonidine Tartrate     Other reaction(s): Eye redness   Brinzolamide-Brimonidine     Other reaction(s): Eye redness   Other Other (See Comments)    Simbrinza Eye Drops gives pt Eye redness    Current Meds  Medication Sig   albuterol (VENTOLIN HFA) 108 (90 Base) MCG/ACT inhaler TAKE 2 PUFFS BY MOUTH EVERY 6 HOURS AS NEEDED   amLODipine (NORVASC) 2.5 MG tablet Take 1 tablet (2.5 mg total) by mouth 2 (two) times daily.   ASPIRIN 81 PO Take by mouth.   ferrous sulfate 325 (65 FE) MG EC tablet Take 325 mg by mouth daily.   finasteride (PROSCAR) 5 MG tablet Take 1 tablet (5 mg total) by mouth daily.   fluticasone (FLONASE) 50 MCG/ACT nasal spray Place 2 sprays into both nostrils as needed.   Fluticasone-Umeclidin-Vilant (TRELEGY ELLIPTA) 200-62.5-25 MCG/ACT AEPB Inhale 1 puff into the lungs daily.   latanoprost (XALATAN) 0.005 % ophthalmic solution ADMINISTER 1 DROP TO THE RIGHT EYE NIGHTLY.   omeprazole (PRILOSEC) 40 MG capsule TAKE 1 CAPSULE (40 MG TOTAL) BY MOUTH DAILY.   rosuvastatin (CRESTOR) 10 MG tablet Take 1 tablet (10 mg total) by mouth daily.   tamsulosin (FLOMAX) 0.4 MG CAPS capsule Take 1 capsule (0.4 mg total) by mouth daily.   timolol (TIMOPTIC) 0.5 % ophthalmic solution Place 1 drop into the right eye 2 (two) times daily.   valsartan (DIOVAN) 320 MG tablet Take 1 tablet (320 mg total) by mouth at bedtime.   vitamin B-12 (CYANOCOBALAMIN) 1000 MCG  tablet Take by mouth.   [DISCONTINUED] fluticasone furoate-vilanterol (BREO ELLIPTA) 200-25 MCG/ACT AEPB TAKE 1 PUFF BY MOUTH EVERY DAY   [DISCONTINUED] hydrALAZINE (APRESOLINE) 10 MG tablet Take 1 tablet (10 mg total) by mouth 3 (three) times daily.    Immunization History  Administered Date(s) Administered   Fluad Quad(high Dose 65+) 08/27/2020, 07/11/2021, 06/16/2022   Influenza, High Dose Seasonal PF 06/13/2016, 08/20/2017, 07/31/2018, 08/22/2019, 06/15/2023   Influenza,inj,Quad PF,6+ Mos 07/20/2015   PFIZER Comirnaty(Gray Top)Covid-19 Tri-Sucrose Vaccine 02/26/2021, 06/28/2022   PFIZER(Purple Top)SARS-COV-2 Vaccination 10/30/2019, 11/20/2019, 07/05/2020   Pfizer Covid-19 Vaccine Bivalent Booster 6yrs & up 07/01/2021   Pfizer(Comirnaty)Fall Seasonal Vaccine 12 years and older 07/04/2023   Pneumococcal Conjugate-13 06/10/2015   Pneumococcal Polysaccharide-23 06/30/2013   Respiratory Syncytial Virus Vaccine,Recomb Aduvanted(Arexvy) 10/07/2022   Tdap 10/25/2021   Zoster Recombinant(Shingrix) 07/06/2018, 08/15/2021   Zoster, Live 06/30/2013        Objective:     BP (!) 154/76 (BP Location: Right Arm, Patient Position: Sitting, Cuff Size: Normal)   Pulse 68   Temp 97.6 F (36.4 C) (Temporal)   Ht 5\' 10"  (1.778 m)   Wt 153 lb 12.8 oz (69.8 kg)  SpO2 97%   BMI 22.07 kg/m   SpO2: 97 %  GENERAL: Thin, fit appearing gentleman, looks younger than stated age, fully ambulatory, no conversational dyspnea HEAD: Normocephalic, atraumatic.  EYES: Pupils equal, round, reactive to light.  No scleral icterus.  Strabismus with exotropia of right eye. MOUTH: Dentition intact, oral mucosa moist.  No thrush. NECK: Supple. No thyromegaly. Trachea midline. No JVD.  No adenopathy. PULMONARY: Good air entry bilaterally.  No adventitious sounds. CARDIOVASCULAR: S1 and S2. Regular rate and rhythm.  No rubs, murmurs or gallops heard. ABDOMEN: Benign. MUSCULOSKELETAL: No joint deformity, no  clubbing, no edema.  NEUROLOGIC: No overt focal deficit, no gait disturbance, speech is fluent. SKIN: Intact,warm,dry. PSYCH: Mood and behavior normal.   Assessment & Plan:     ICD-10-CM   1. Severe persistent asthma without complication  J45.50     2. SOB (shortness of breath)  R06.02     3. Epistaxis  R04.0      Meds ordered this encounter  Medications   Fluticasone-Umeclidin-Vilant (TRELEGY ELLIPTA) 200-62.5-25 MCG/ACT AEPB    Sig: Inhale 1 puff into the lungs daily.    Dispense:  28 each    Refill:  0    Lot Number?:   [id number]    Expiration Date?:   02/06/2025    Quantity:   2   Discussion:    Asthma Reports no wheezing but experiences persistent chest tightness and easy fatigability. Current management with Virgel Bouquet is suboptimal. Consideration of allergy-related asthma and potential benefit of biologic therapy due to presence of eosinophilia. Suggest trial of Trelegy, for better symptom control. Samples provided to ensure insurance coverage before prescription. - Provide samples of Trelegy for trial use. - Evaluate the effectiveness of Trelegy in 6 to 8 weeks. - Consider biologic therapy if Trelegy does not improve symptoms.  Allergic Rhinitis Symptoms suggestive of allergic rhinitis, potentially contributing to both asthma and nasal issues. Presence of eosinophilia indicates that addressing these allergies could help with both asthma and nasal symptoms. - Consider allergy management as part of asthma treatment plan.  Epistaxis Managed by ENT specialist with previous cauterization of a vessel. Reports traces of blood again, indicating potential recurrence. ENT specialist informed that further cauterization may be necessary. - Continue follow-up with ENT specialist for management of epistaxis.      Advised if symptoms do not improve or worsen, to please contact office for sooner follow up or seek emergency care.    I spent 43 minutes of dedicated to the care of this patient  on the date of this encounter to include pre-visit review of records, face-to-face time with the patient discussing conditions above, post visit ordering of testing, clinical documentation with the electronic health record, making appropriate referrals as documented, and communicating necessary findings to members of the patients care team.     C. Danice Goltz, MD Advanced Bronchoscopy PCCM [facility]    *This note was generated using voice recognition software/Dragon and/or AI transcription program.  Despite best efforts to proofread, errors can occur which can change the meaning. Any transcriptional errors that result from this process are unintentional and may not be fully corrected at the time of dictation.

## 2023-12-13 NOTE — Patient Instructions (Signed)
 VISIT SUMMARY:  Today, we discussed your ongoing asthma symptoms, including persistent chest tightness and easy fatigability, as well as your recent nasal issues and episode of epistaxis. We reviewed your current asthma management and considered adjustments to improve your symptoms. We also discussed the potential link between your allergies and both your asthma and nasal issues.  YOUR PLAN:  -ASTHMA: Asthma is a condition where your airways narrow and swell, making it difficult to breathe. You are experiencing persistent chest tightness and easy fatigability without wheezing. We will try switching your medication to Trelegy 200, 1 puff daily, this is similar to [employer]us [place] [employer] but with an extra ingredient, to see if it provides better symptom control. You will use the samples provided and we will evaluate its effectiveness in 6 to 8 weeks. If your symptoms do not improve, we may consider biologic therapy (shots).  -ALLERGIC RHINITIS: Allergic rhinitis is an allergic reaction that causes sneezing, congestion, and a runny nose. It may be contributing to your asthma and nasal issues. We will consider managing your allergies as part of your asthma treatment plan to help alleviate both conditions.  -EPISTAXIS: Epistaxis is the medical term for a nosebleed. You recently had a nasal vessel cauterized by your ENT specialist, but you have noticed traces of bleeding again. You should continue to follow up with your ENT specialist, as further cauterization may be necessary.  INSTRUCTIONS:  Please use the Trelegy samples provided and monitor your symptoms.  Let us know how you do with the samples so that we can call the medication into your pharmacy.  Do not use the Breo once you start the Trelegy.  We will evaluate the effectiveness of Trelegy in 6 to 8 weeks. Continue to follow up with your ENT specialist for your nasal issues and potential further cauterization.

## 2023-12-20 ENCOUNTER — Other Ambulatory Visit: Payer: Self-pay | Admitting: Internal Medicine

## 2023-12-31 ENCOUNTER — Encounter: Payer: Self-pay | Admitting: Pulmonary Disease

## 2024-01-02 ENCOUNTER — Ambulatory Visit: Payer: Medicare Other | Attending: Cardiology | Admitting: Cardiology

## 2024-01-02 VITALS — BP 130/78 | HR 68 | Ht 70.0 in | Wt 154.0 lb

## 2024-01-02 DIAGNOSIS — I10 Essential (primary) hypertension: Secondary | ICD-10-CM | POA: Diagnosis not present

## 2024-01-02 DIAGNOSIS — I2510 Atherosclerotic heart disease of native coronary artery without angina pectoris: Secondary | ICD-10-CM | POA: Insufficient documentation

## 2024-01-02 DIAGNOSIS — E7800 Pure hypercholesterolemia, unspecified: Secondary | ICD-10-CM | POA: Diagnosis not present

## 2024-01-02 MED ORDER — AMLODIPINE BESYLATE 5 MG PO TABS: 5.0000 mg | ORAL_TABLET | Freq: Two times a day (BID) | ORAL | 3 refills | Status: DC

## 2024-01-02 MED ORDER — VALSARTAN 320 MG PO TABS: 320.0000 mg | ORAL_TABLET | Freq: Every evening | ORAL | 3 refills | Status: AC

## 2024-01-02 NOTE — Progress Notes (Signed)
 Cardiology Office Note:    Date:  01/02/2024   ID:  Jose Perry, Jose Perry, MRN [id number]  PCP:  Dale [place], MD   [facility] Providers Cardiologist:  Debbe Odea, MD     Referring MD: Dale [place], MD   No chief complaint on file.   History of Present Illness:    Jose Perry is a 83 y.o. male with a hx of Mild nonobstructive CAD (proximal LAD, D1, RCA ) coronary CT 8/23, hypertension, hyperlipidemia, GERD who presents for follow-up.    States doing okay, blood pressures become elevated usually in the evenings.  Takes valsartan at bedtime.  Otherwise doing okay, compliant with medications as prescribed.   Prior notes Echo 06/2022 EF 55 to 60% Coronary CT 05/2022 mild nonobstructive CAD in proximal LAD, D1, RCA.  Past Medical History:  Diagnosis Date   Asthma    Dyspnea    Elevated blood pressure    Enlarged prostate    GERD (gastroesophageal reflux disease)    Glaucoma    History of adenomatous polyp of colon    Hx of colonic polyps    Hypertension    Leukopenia    has had an extensive w/up.     Liver hemangioma 01/24/2018   Thyroid goiter     Past Surgical History:  Procedure Laterality Date   BACK SURGERY  2002   ruptured disc   CATARACT EXTRACTION W/PHACO Right 06/30/2020   Procedure: CATARACT EXTRACTION PHACO AND INTRAOCULAR LENS PLACEMENT (IOC) RIGHT, [place] 7.19 01:30.8 7.9%;  Surgeon: Lockie Mola, MD;  Location: [facility];  Service: Ophthalmology;  Laterality: Right;   COLONOSCOPY W/ POLYPECTOMY  04/24/2005, 07/06/2010, 07/08/2014   COLONOSCOPY WITH PROPOFOL N/A 02/01/2018   Procedure: COLONOSCOPY WITH PROPOFOL;  Surgeon: Scot Jun, MD;  Location: [facility];  Service: Endoscopy;  Laterality: N/A;   COLONOSCOPY WITH PROPOFOL N/A 10/09/2023   Procedure: COLONOSCOPY WITH PROPOFOL;  Surgeon: Toledo, Boykin Nearing, MD;  Location: [facility];  Service: Gastroenterology;  Laterality: N/A;    ESOPHAGOGASTRODUODENOSCOPY  04/24/2005, 07/06/2010,   ESOPHAGOGASTRODUODENOSCOPY (EGD) WITH PROPOFOL N/A 05/10/2022   Procedure: ESOPHAGOGASTRODUODENOSCOPY (EGD) WITH PROPOFOL;  Surgeon: Toledo, Boykin Nearing, MD;  Location: [facility];  Service: Gastroenterology;  Laterality: N/A;   EYE SURGERY  2009   relieve pressure from glaucoma   POLYPECTOMY  10/09/2023   Procedure: POLYPECTOMY;  Surgeon: Toledo, Boykin Nearing, MD;  Location: [facility];  Service: Gastroenterology;;   THYROID SURGERY  1968   goiter    Current Medications: Current Meds  Medication Sig   albuterol (VENTOLIN HFA) 108 (90 Base) MCG/ACT inhaler TAKE 2 PUFFS BY MOUTH EVERY 6 HOURS AS NEEDED   ASPIRIN 81 PO Take by mouth.   ferrous sulfate 325 (65 FE) MG EC tablet Take 325 mg by mouth daily.   finasteride (PROSCAR) 5 MG tablet Take 1 tablet (5 mg total) by mouth daily.   fluticasone (FLONASE) 50 MCG/ACT nasal spray Place 2 sprays into both nostrils as needed.   Fluticasone-Umeclidin-Vilant (TRELEGY ELLIPTA) 200-62.5-25 MCG/ACT AEPB Inhale 1 puff into the lungs daily.   hydrALAZINE (APRESOLINE) 10 MG tablet TAKE 1 TABLET BY MOUTH THREE TIMES A DAY   latanoprost (XALATAN) 0.005 % ophthalmic solution ADMINISTER 1 DROP TO THE RIGHT EYE NIGHTLY.   omeprazole (PRILOSEC) 40 MG capsule TAKE 1 CAPSULE (40 MG TOTAL) BY MOUTH DAILY.   rosuvastatin (CRESTOR) 10 MG tablet Take 1 tablet (10 mg total) by mouth daily.   tamsulosin (FLOMAX) 0.4 MG CAPS  capsule Take 1 capsule (0.4 mg total) by mouth daily.   timolol (TIMOPTIC) 0.5 % ophthalmic solution Place 1 drop into the right eye 2 (two) times daily.   vitamin B-12 (CYANOCOBALAMIN) 1000 MCG tablet Take by mouth.   [DISCONTINUED] amLODipine (NORVASC) 2.5 MG tablet Take 1 tablet (2.5 mg total) by mouth 2 (two) times daily.   [DISCONTINUED] valsartan (DIOVAN) 320 MG tablet Take 1 tablet (320 mg total) by mouth at bedtime.     Allergies:   Brimonidine, Brimonidine tartrate,  Brinzolamide-brimonidine, and Other   Social History   Socioeconomic History   Marital status: Married    Spouse name: Not on file   Number of children: Not on file   Years of education: Not on file   Highest education level: Some college, no degree  Occupational History   Not on file  Tobacco Use   Smoking status: Never   Smokeless tobacco: Never  Vaping Use   Vaping status: Never Used  Substance and Sexual Activity   Alcohol use: No    Alcohol/week: 0.0 standard drinks of alcohol   Drug use: No   Sexual activity: Yes  Other Topics Concern   Not on file  Social History Narrative   married   Social Drivers of Corporate investment banker Strain: Low Risk  (10/30/2023)   Overall Financial Resource Strain (CARDIA)    Difficulty of Paying Living Expenses: Not hard at all  Food Insecurity: No Food Insecurity (10/30/2023)   Hunger Vital Sign    Worried About Running Out of Food in the Last Year: Never true    Ran Out of Food in the Last Year: Never true  Transportation Needs: No Transportation Needs (10/30/2023)   PRAPARE - Administrator, Civil Service (Medical): No    Lack of Transportation (Non-Medical): No  Physical Activity: Sufficiently Active (10/30/2023)   Exercise Vital Sign    Days of Exercise per Week: 7 days    Minutes of Exercise per Session: 90 min  Stress: No Stress Concern Present (10/30/2023)   [employer] of Occupational Health - Occupational Stress Questionnaire    Feeling of Stress : Not at all  Social Connections: Moderately Isolated (10/30/2023)   Social Connection and Isolation Panel [NHANES]    Frequency of Communication with Friends and Family: Three times a week    Frequency of Social Gatherings with Friends and Family: Once a week    Attends Religious Services: Never    Database administrator or Organizations: No    Attends Engineer, structural: Not on file    Marital Status: Married     Family History: The patient's  family history includes Alcohol abuse in his father; Heart disease in his mother; Hyperlipidemia in his father; Stroke in his father.  ROS:   Please see the history of present illness.     All other systems reviewed and are negative.  EKGs/Labs/Other Studies Reviewed:    The following studies were reviewed today:        Recent Labs: 09/11/2023: TSH 1.83 11/30/2023: ALT 22; BUN 16; Creatinine, Ser 1.21; Hemoglobin 13.9; Platelets 141; Potassium 4.1; Sodium 137  Recent Lipid Panel    Component Value Date/Time   CHOL 170 09/11/2023 0752   TRIG 42.0 09/11/2023 0752   HDL 77.00 09/11/2023 0752   CHOLHDL 2 09/11/2023 0752   VLDL 8.4 09/11/2023 0752   LDLCALC 85 09/11/2023 0752     Risk Assessment/Calculations:  Physical Exam:    VS:  BP 130/78 (BP Location: Left Arm)   Pulse 68   Ht 5\' 10"  (1.778 m)   Wt 154 lb (69.9 kg)   SpO2 98%   BMI 22.10 kg/m     Wt Readings from Last 3 Encounters:  01/02/24 154 lb (69.9 kg)  12/13/23 153 lb 12.8 oz (69.8 kg)  11/30/23 150 lb (68 kg)     GEN:  Well nourished, well developed in no acute distress HEENT: Normal NECK: No JVD; No carotid bruits CARDIAC: RRR, no murmurs, rubs, gallops RESPIRATORY:  Clear to auscultation without rales, wheezing or rhonchi  ABDOMEN: Soft, non-tender, non-distended MUSCULOSKELETAL:  No edema; No deformity  SKIN: Warm and dry NEUROLOGIC:  Alert and oriented x 3 PSYCHIATRIC:  Normal affect   ASSESSMENT:    1. Coronary artery disease involving native heart without angina pectoris, unspecified vessel or lesion type   2. Pure hypercholesterolemia   3. Primary hypertension     PLAN:    In order of problems listed above:  Mild nonobstructive CAD (proximal LAD, D1, RCA ) coronary CT 8/23.  EF 55 to 60%.  Continue aspirin 81 mg daily, Crestor 10 mg daily. Hyperlipidemia, continue Crestor 10 mg. Hypertension, BP controlled.  Continue Norvasc 5 mg in a.m., valsartan 320 mg in the evening..     Follow-up in 6 months      Medication Adjustments/Labs and Tests Ordered: Current medicines are reviewed at length with the patient today.  Concerns regarding medicines are outlined above.  No orders of the defined types were placed in this encounter.  Meds ordered this encounter  Medications   amLODipine (NORVASC) 5 MG tablet    Sig: Take 1 tablet (5 mg total) by mouth 2 (two) times daily.    Dispense:  90 tablet    Refill:  3   valsartan (DIOVAN) 320 MG tablet    Sig: Take 1 tablet (320 mg total) by mouth every evening.    Dispense:  90 tablet    Refill:  3    Patient Instructions  Medication Instructions:  Increase Amlodipine to 5 mg daily   Take Valsartan in the evenings.  *If you need a refill on your cardiac medications before your next appointment, please call your pharmacy*   Follow-Up: At [facility], you and your health needs are our priority.  As part of our continuing mission to provide you with exceptional heart care, we have created designated Provider Care Teams.  These Care Teams include your primary Cardiologist (physician) and Advanced Practice Providers (APPs -  Physician Assistants and Nurse Practitioners) who all work together to provide you with the care you need, when you need it.  We recommend signing up for the patient portal called "MyChart".  Sign up information is provided on this After Visit Summary.  MyChart is used to connect with patients for Virtual Visits (Telemedicine).  Patients are able to view lab/test results, encounter notes, upcoming appointments, etc.  Non-urgent messages can be sent to your provider as well.   To learn more about what you can do with MyChart, go to [email or web address].    Your next appointment:   6 month(s)  Provider:   You may see Debbe Odea, MD or one of the following Advanced Practice Providers on your designated Care Team:   Nicolasa Ducking, NP Eula Listen, PA-C Cadence Fransico Michael, PA-C Charlsie Quest, NP Carlos Levering, NP      Signed, Debbe Odea, MD  01/02/2024  12:29 PM    [facility]

## 2024-01-02 NOTE — Patient Instructions (Signed)
 Medication Instructions:  Increase Amlodipine to 5 mg daily   Take Valsartan in the evenings.  *If you need a refill on your cardiac medications before your next appointment, please call your pharmacy*   Follow-Up: At [facility], you and your health needs are our priority.  As part of our continuing mission to provide you with exceptional heart care, we have created designated Provider Care Teams.  These Care Teams include your primary Cardiologist (physician) and Advanced Practice Providers (APPs -  Physician Assistants and Nurse Practitioners) who all work together to provide you with the care you need, when you need it.  We recommend signing up for the patient portal called "MyChart".  Sign up information is provided on this After Visit Summary.  MyChart is used to connect with patients for Virtual Visits (Telemedicine).  Patients are able to view lab/test results, encounter notes, upcoming appointments, etc.  Non-urgent messages can be sent to your provider as well.   To learn more about what you can do with MyChart, go to [email or web address].    Your next appointment:   6 month(s)  Provider:   You may see Debbe Odea, MD or one of the following Advanced Practice Providers on your designated Care Team:   Nicolasa Ducking, NP Eula Listen, PA-C Cadence Fransico Michael, PA-C Charlsie Quest, NP Carlos Levering, NP

## 2024-01-03 ENCOUNTER — Other Ambulatory Visit: Payer: Self-pay | Admitting: Pulmonary Disease

## 2024-01-03 MED ORDER — TRELEGY ELLIPTA 200-62.5-25 MCG/ACT IN AEPB: 1.0000 | INHALATION_SPRAY | Freq: Every day | RESPIRATORY_TRACT | 11 refills | Status: DC

## 2024-01-03 NOTE — Telephone Encounter (Signed)
 Copied from CRM [id number]. Topic: Clinical - Medication Refill >> Jan 03, 2024  2:39 PM Dimitri Ped wrote: Most Recent Primary Care Visit:  Provider: Dale Jamaica  Department: [facility]  Visit Type: OFFICE VISIT  Date: 11/22/2023  Medication: Fluticasone-Umeclidin-Vilant (TRELEGY ELLIPTA) 200-62.5-25 MCG/ACT AEPB  Has the patient contacted their pharmacy? No first time to fill  (Agent: If no, request that the patient contact the pharmacy for the refill. If patient does not wish to contact the pharmacy document the reason why and proceed with request.) (Agent: If yes, when and what did the pharmacy advise?)  Is this the correct pharmacy for this prescription? Yes If no, delete pharmacy and type the correct one.  This is the patient's preferred pharmacy:  [id number] - [address] [place] [address] Phone: [phone] Fax: [phone]   Has the prescription been filled recently? No first time filling  Is the patient out of the medication? Yes never have received   Has the patient been seen for an appointment in the last year OR does the patient have an upcoming appointment? Yes  Can we respond through MyChart? Yes  Agent: Please be advised that Rx refills may take up to 3 business days. We ask that you follow-up with your pharmacy.  wanted to speak with the nurse about sending in a prescription . [email or web address]

## 2024-01-07 ENCOUNTER — Telehealth: Payer: Self-pay | Admitting: Pulmonary Disease

## 2024-01-07 ENCOUNTER — Other Ambulatory Visit: Payer: Self-pay

## 2024-01-07 MED ORDER — TRELEGY ELLIPTA 200-62.5-25 MCG/ACT IN AEPB: 1.0000 | INHALATION_SPRAY | Freq: Every day | RESPIRATORY_TRACT | 0 refills | Status: DC

## 2024-01-07 MED ORDER — TRELEGY ELLIPTA 200-62.5-25 MCG/ACT IN AEPB: 1.0000 | INHALATION_SPRAY | Freq: Every day | RESPIRATORY_TRACT | 3 refills | Status: DC

## 2024-01-07 NOTE — Telephone Encounter (Signed)
 Pt in lobby checking on getting Trelegy sample until mail order able to get med to him

## 2024-01-07 NOTE — Telephone Encounter (Signed)
 Patient has been given one sample of Trelegy 200mg  to last until his prescription comes in the mail.  Nothing further needed.

## 2024-01-07 NOTE — Progress Notes (Signed)
 Received a fax from [facility] asking for a 90 supply of the Trelegy. I have sent in the prescription.  Nothing further needed.

## 2024-01-18 ENCOUNTER — Encounter: Payer: Self-pay | Admitting: Pulmonary Disease

## 2024-01-18 ENCOUNTER — Ambulatory Visit: Admitting: Pulmonary Disease

## 2024-01-18 VITALS — BP 126/80 | HR 60 | Temp 97.9°F | Ht 70.0 in | Wt 152.2 lb

## 2024-01-18 DIAGNOSIS — J4550 Severe persistent asthma, uncomplicated: Secondary | ICD-10-CM | POA: Diagnosis not present

## 2024-01-18 DIAGNOSIS — J4540 Moderate persistent asthma, uncomplicated: Secondary | ICD-10-CM

## 2024-01-18 LAB — NITRIC OXIDE: Nitric Oxide: 40

## 2024-01-18 MED ORDER — FLUTICASONE FUROATE-VILANTEROL 100-25 MCG/ACT IN AEPB: 1.0000 | INHALATION_SPRAY | Freq: Every day | RESPIRATORY_TRACT | 6 refills | Status: DC

## 2024-01-18 MED ORDER — ALBUTEROL SULFATE HFA 108 (90 BASE) MCG/ACT IN AERS
2.0000 | INHALATION_SPRAY | RESPIRATORY_TRACT | 2 refills | Status: DC | PRN
Start: 2024-01-18 — End: 2024-03-27

## 2024-01-18 NOTE — Progress Notes (Signed)
 Subjective:    Patient ID: Jose Perry, male    DOB: 1941/01/13, 83 y.o.   MRN: [id number]  Patient Care Team: Dale [place], MD as PCP - General (Internal Medicine) Debbe Odea, MD as PCP - Cardiology (Cardiology) [name], MD as Consulting Physician (Pulmonary Disease)  Chief Complaint  Patient presents with   Follow-up    No breathing problems. Weakness and joint pain with trelegy.     BACKGROUND/INTERVAL:Patient is an 83 year old lifelong never smoker with medical history as noted below, who presents for follow-up of shortness of breath.  Patient was last seen on 30 July 2023.  He has asthma with eosinophilic and allergic phenotype.  He has poor understanding of his disease process. Nitric oxide on 15 June 2023 was 39 ppb.   HPI Discussed the use of AI scribe software for clinical note transcription with the patient, who gave verbal consent to proceed.  History of Present Illness   Jose Perry is an 83 year old male with severe persistent asthma who presents with medication management issues.  He has been experiencing medication management issues related to his severe persistent asthma. He was previously on Breo for asthma management without any issues but was switched to Trelegy, which led to joint aches and a decrease in strength. This was particularly noticeable because he works out every day, leading him to stop using Trelegy.  He did not notify us of this.  Currently not on any controller medication.  He has not resumed using Breo since stopping Trelegy. He was not using Breo daily as initially prescribed, which he believes contributed to his current issues. He wants to return to the initial Breo regimen, specifically the Breo 100, and commits to using it daily.  He has poor understanding of the need for asthma controller medication.  He has a rescue inhaler but has not needed to use it recently. He requests to continue having access to the rescue  inhaler, which he identifies as the one in the blue container (Ventolin).  He is not currently receiving any asthma-related injections and has not experienced any exacerbations that would necessitate such treatment.   He does not endorse any fevers, chills or sweats.  No chest pain, no lower extremity edema.  No cough or sputum production.     DATA 07/11/2018 PFTs: FEV1 0.77 L or 31% predicted, FVC 1.52 L or 47% predicted, FEV1/FVC 51%, there was a 40% net change on FEV1 postbronchodilator.  There was air trapping but no hyperinflation noted on lung volumes.  Consistent with severe obstructive airways disease with significant bronchodilator response 05/18/2022 coronary CTA: Lung windows nonrevealing. 05/25/2022 chest x-ray PA and lateral: Hyperinflation otherwise no abnormalities. Review of Systems A 10 point review of systems was performed and it is as noted above otherwise negative. 08/03/2022 nitric oxide: 57 ppb 08/25/2022 alpha 1: Phenotype MM, level 124 08/25/2022 IgE,Allergen panel/eosinophils: IgE 325, multiple allergens identified, no eosinophilia 09/12/2022 PFTs: FEV1 1.28 L or 44% predicted, FVC 1.93 L or 47% predicted, FEV1/FVC 66%, no significant bronchodilator response.  Pseudo restriction may be on the basis of air trapping. 06/06/2023 CBC: 900 eosinophils noted. 06/15/2023 nitirc oxide:39 ppb 07/30/2023 nitric oxide: 25 ppb  Review of Systems A 10 point review of systems was performed and it is as noted above otherwise negative.   Patient Active Problem List   Diagnosis Date Noted   Epistaxis 09/17/2023   Swelling of lower extremity 04/13/2023   Allergic rhinitis 02/13/2023   Otitis externa  02/13/2023   Diarrhea 10/30/2021   Iron deficiency 05/05/2020   Hyperplasia of prostate with lower urinary tract symptoms (LUTS) 07/10/2018   Exotropia 07/10/2018   Blepharoconjunctivitis 07/10/2018   Primary open angle glaucoma (POAG) 06/28/2018   B12 deficiency 05/14/2018    Liver hemangioma 01/24/2018   Thrombocytopenia (HCC) 12/19/2017   Change in stool 10/12/2017   Asthma 08/30/2017   Urinary urgency 05/23/2017   Thyroid nodule 06/13/2016   Primary open angle glaucoma (POAG) of right eye, moderate stage 04/10/2016   Shortness of breath 08/30/2015   Bruit 06/14/2015   Health care maintenance 12/13/2014   Glaucoma 09/21/2014   Goiter 06/28/2014   Hypertension 06/28/2014   Leukopenia 06/28/2014   History of colonic polyps 06/28/2014   Enlarged prostate 06/28/2014   GERD (gastroesophageal reflux disease) 06/28/2014   History of adenomatous polyp of colon 06/26/2014   Gastrointestinal ulcer due to Helicobacter pylori 06/26/2014   Male erectile dysfunction, unspecified 07/24/2012   Incomplete emptying of bladder 07/24/2012   Elevated prostate specific antigen (PSA) 07/24/2012   Benign localized hyperplasia of prostate with urinary obstruction 07/24/2012    Social History   Tobacco Use   Smoking status: Never   Smokeless tobacco: Never  Substance Use Topics   Alcohol use: No    Alcohol/week: 0.0 standard drinks of alcohol    Allergies  Allergen Reactions   Brimonidine     Other reaction(s): Eye Redness   Brimonidine Tartrate     Other reaction(s): Eye redness   Brinzolamide-Brimonidine     Other reaction(s): Eye redness   Other Other (See Comments)    Simbrinza Eye Drops gives pt Eye redness    Current Meds  Medication Sig   amLODipine (NORVASC) 5 MG tablet Take 1 tablet (5 mg total) by mouth 2 (two) times daily.   ASPIRIN 81 PO Take by mouth.   ferrous sulfate 325 (65 FE) MG EC tablet Take 325 mg by mouth daily.   finasteride (PROSCAR) 5 MG tablet Take 1 tablet (5 mg total) by mouth daily.   fluticasone (FLONASE) 50 MCG/ACT nasal spray Place 2 sprays into both nostrils as needed.   fluticasone furoate-vilanterol (BREO ELLIPTA) 100-25 MCG/ACT AEPB Inhale 1 puff into the lungs daily.   hydrALAZINE (APRESOLINE) 10 MG tablet TAKE 1 TABLET  BY MOUTH THREE TIMES A DAY   latanoprost (XALATAN) 0.005 % ophthalmic solution ADMINISTER 1 DROP TO THE RIGHT EYE NIGHTLY.   omeprazole (PRILOSEC) 40 MG capsule TAKE 1 CAPSULE (40 MG TOTAL) BY MOUTH DAILY.   rosuvastatin (CRESTOR) 10 MG tablet Take 1 tablet (10 mg total) by mouth daily.   tamsulosin (FLOMAX) 0.4 MG CAPS capsule Take 1 capsule (0.4 mg total) by mouth daily.   timolol (TIMOPTIC) 0.5 % ophthalmic solution Place 1 drop into the right eye 2 (two) times daily.   valsartan (DIOVAN) 320 MG tablet Take 1 tablet (320 mg total) by mouth every evening.   vitamin B-12 (CYANOCOBALAMIN) 1000 MCG tablet Take by mouth.   [DISCONTINUED] albuterol (VENTOLIN HFA) 108 (90 Base) MCG/ACT inhaler TAKE 2 PUFFS BY MOUTH EVERY 6 HOURS AS NEEDED    Immunization History  Administered Date(s) Administered   Fluad Quad(high Dose 65+) 08/27/2020, 07/11/2021, 06/16/2022   Influenza, High Dose Seasonal PF 06/13/2016, 08/20/2017, 07/31/2018, 08/22/2019, 06/15/2023   Influenza,inj,Quad PF,6+ Mos 07/20/2015   PFIZER Comirnaty(Gray Top)Covid-19 Tri-Sucrose Vaccine 02/26/2021, 06/28/2022   PFIZER(Purple Top)SARS-COV-2 Vaccination 10/30/2019, 11/20/2019, 07/05/2020   Pfizer Covid-19 Vaccine Bivalent Booster 100yrs & up 07/01/2021   Pfizer(Comirnaty)Fall Seasonal  Vaccine 12 years and older 07/04/2023   Pneumococcal Conjugate-13 06/10/2015   Pneumococcal Polysaccharide-23 06/30/2013   Respiratory Syncytial Virus Vaccine,Recomb Aduvanted(Arexvy) 10/07/2022   Tdap 10/25/2021   Zoster Recombinant(Shingrix) 07/06/2018, 08/15/2021   Zoster, Live 06/30/2013        Objective:     BP 126/80 (BP Location: Left Arm, Patient Position: Sitting, Cuff Size: Normal)   Pulse 60   Temp 97.9 F (36.6 C) (Temporal)   Ht 5\' 10"  (1.778 m)   Wt 152 lb 3.2 oz (69 kg)   SpO2 100%   BMI 21.84 kg/m   SpO2: 100 %  GENERAL: Thin, fit appearing gentleman, looks younger than stated age, fully ambulatory, no conversational  dyspnea HEAD: Normocephalic, atraumatic.  EYES: Pupils equal, round, reactive to light.  No scleral icterus.  Strabismus with exotropia of right eye. MOUTH: Dentition intact, oral mucosa moist.  No thrush. NECK: Supple. No thyromegaly. Trachea midline. No JVD.  No adenopathy. PULMONARY: Good air entry bilaterally.  No adventitious sounds. CARDIOVASCULAR: S1 and S2. Regular rate and rhythm.  No rubs, murmurs or gallops heard. ABDOMEN: Benign. MUSCULOSKELETAL: No joint deformity, no clubbing, no edema.  NEUROLOGIC: No overt focal deficit, no gait disturbance, speech is fluent. SKIN: Intact,warm,dry. PSYCH: Mood and behavior normal.  Lab Results  Component Value Date   NITRICOXIDE 40 01/18/2024  Trend:39>>25>>30>>40 *There is evidence of type II inflammation (eosinophilic inflammation)      Assessment & Plan:     ICD-10-CM   1. Severe persistent asthma without complication  J45.50 Nitric oxide    2. Moderate persistent asthma without complication  J45.40 albuterol (VENTOLIN HFA) 108 (90 Base) MCG/ACT inhaler     Orders Placed This Encounter  Procedures   Nitric oxide   Meds ordered this encounter  Medications   fluticasone furoate-vilanterol (BREO ELLIPTA) 100-25 MCG/ACT AEPB    Sig: Inhale 1 puff into the lungs daily.    Dispense:  60 each    Refill:  6    J 45.5   albuterol (VENTOLIN HFA) 108 (90 Base) MCG/ACT inhaler    Sig: Inhale 2 puffs into the lungs every 4 (four) hours as needed for wheezing or shortness of breath.    Dispense:  8.5 each    Refill:  2    DX Code Needed : J 45.5.   Discussion:    Severe persistent asthma He has severe persistent asthma and previously used Trelegy, which he discontinued due to joint aches. He reported better control with Breo, although he was not using it daily as recommended. Inflammation levels have increased, indicating suboptimal asthma control. He prefers to return to [place], which he tolerated well, and understands the  importance of daily use to prevent exacerbations. He has not required a rescue inhaler recently but should have it available for emergencies. He is not on Biologics, managing with inhalers.  He has poor understanding of the need for controller medication and asthma despite repeated counseling in this regard. - Prescribe Breo 100 for daily use. - Ensure availability of a rescue inhaler. - Educate on the importance of daily inhaler use to prevent inflammation and exacerbations. - Monitor inflammation levels to assess asthma control.  Follow-up Monitor to ensure asthma control is maintained with the adjusted medication regimen. - Schedule follow-up appointment in 3 months to assess asthma control and medication efficacy.     Advised if symptoms do not improve or worsen, to please contact office for sooner follow up or seek emergency care.  I spent 32 minutes of dedicated to the care of this patient on the date of this encounter to include pre-visit review of records, face-to-face time with the patient discussing conditions above, post visit ordering of testing, clinical documentation with the electronic health record, making appropriate referrals as documented, and communicating necessary findings to members of the patients care team.     C. Chloe Counter, MD Advanced Bronchoscopy P[facility]M [facility]    *This note was generated using voice recognition software/Dragon and/or AI transcription program.  Despite best efforts to proofread, errors can occur which can change the meaning. Any transcriptional errors that result from this process are unintentional and may not be fully corrected at the time of dictation.

## 2024-01-18 NOTE — Patient Instructions (Signed)
 VISIT SUMMARY:  You came in today to discuss issues with managing your severe persistent asthma medications. You had been using Trelegy but experienced joint aches and decreased strength, especially noticeable during your daily workouts. You stopped using Trelegy and have not resumed using Breo, which you were on previously. You believe not using Breo daily contributed to your current issues and want to return to using it daily.  YOUR PLAN:  -SEVERE PERSISTENT ASTHMA: Severe persistent asthma is a long-term condition where the airways in your lungs are always inflamed and can become even more swollen and narrow when something triggers your symptoms. You will return to using Breo 100 daily, as it helped you before. It's important to use it every day to keep inflammation down and prevent asthma attacks. You will also keep a rescue inhaler on hand for emergencies. We will monitor your inflammation levels to make sure your asthma is under control.  INSTRUCTIONS:  Please schedule a follow-up appointment in 3 months to check on your asthma control and see how the medication is working for you.

## 2024-02-14 ENCOUNTER — Other Ambulatory Visit (INDEPENDENT_AMBULATORY_CARE_PROVIDER_SITE_OTHER): Payer: Medicare Other

## 2024-02-14 DIAGNOSIS — D696 Thrombocytopenia, unspecified: Secondary | ICD-10-CM | POA: Diagnosis not present

## 2024-02-14 DIAGNOSIS — E785 Hyperlipidemia, unspecified: Secondary | ICD-10-CM

## 2024-02-14 DIAGNOSIS — I10 Essential (primary) hypertension: Secondary | ICD-10-CM | POA: Diagnosis not present

## 2024-02-14 LAB — HEPATIC FUNCTION PANEL
ALT: 18 U/L (ref 0–53)
AST: 22 U/L (ref 0–37)
Albumin: 4.4 g/dL (ref 3.5–5.2)
Alkaline Phosphatase: 71 U/L (ref 39–117)
Bilirubin, Direct: 0.2 mg/dL (ref 0.0–0.3)
Total Bilirubin: 0.7 mg/dL (ref 0.2–1.2)
Total Protein: 6.6 g/dL (ref 6.0–8.3)

## 2024-02-14 LAB — BASIC METABOLIC PANEL WITH GFR
BUN: 13 mg/dL (ref 6–23)
CO2: 29 meq/L (ref 19–32)
Calcium: 9.8 mg/dL (ref 8.4–10.5)
Chloride: 101 meq/L (ref 96–112)
Creatinine, Ser: 1.26 mg/dL (ref 0.40–1.50)
GFR: 53.29 mL/min — ABNORMAL LOW (ref >60.00)
Glucose, Bld: 82 mg/dL (ref 70–99)
Potassium: 4.2 meq/L (ref 3.5–5.1)
Sodium: 136 meq/L (ref 135–145)

## 2024-02-14 LAB — LIPID PANEL
Cholesterol: 140 mg/dL (ref 0–200)
HDL: 65.2 mg/dL (ref >39.00)
LDL Cholesterol: 67 mg/dL (ref 0–99)
NonHDL: 74.62
Total CHOL/HDL Ratio: 2
Triglycerides: 39 mg/dL (ref 0.0–149.0)
VLDL: 7.8 mg/dL (ref 0.0–40.0)

## 2024-02-14 LAB — CBC WITH DIFFERENTIAL/PLATELET
Basophils Absolute: 0 10*3/uL (ref 0.0–0.1)
Basophils Relative: 1.2 % (ref 0.0–3.0)
Eosinophils Absolute: 0.1 10*3/uL (ref 0.0–0.7)
Eosinophils Relative: 4.6 % (ref 0.0–5.0)
HCT: 40.5 % (ref 39.0–52.0)
Hemoglobin: 13.1 g/dL (ref 13.0–17.0)
Lymphocytes Relative: 41.2 % (ref 12.0–46.0)
Lymphs Abs: 0.9 10*3/uL (ref 0.7–4.0)
MCHC: 32.4 g/dL (ref 30.0–36.0)
MCV: 86.3 fl (ref 78.0–100.0)
Monocytes Absolute: 0.2 10*3/uL (ref 0.1–1.0)
Monocytes Relative: 10 % (ref 3.0–12.0)
Neutro Abs: 0.9 10*3/uL — ABNORMAL LOW (ref 1.4–7.7)
Neutrophils Relative %: 43 % (ref 43.0–77.0)
Platelets: 128 10*3/uL — ABNORMAL LOW (ref 150.0–400.0)
RBC: 4.69 Mil/uL (ref 4.22–5.81)
RDW: 14.8 % (ref 11.5–15.5)
WBC: 2.1 10*3/uL — ABNORMAL LOW (ref 4.0–10.5)

## 2024-02-19 ENCOUNTER — Ambulatory Visit: Payer: Medicare Other | Admitting: Internal Medicine

## 2024-02-19 ENCOUNTER — Encounter: Payer: Self-pay | Admitting: Internal Medicine

## 2024-02-19 VITALS — BP 122/68 | HR 62 | Temp 97.5°F | Ht 70.0 in | Wt 148.2 lb

## 2024-02-19 DIAGNOSIS — E041 Nontoxic single thyroid nodule: Secondary | ICD-10-CM | POA: Diagnosis not present

## 2024-02-19 DIAGNOSIS — M791 Myalgia, unspecified site: Secondary | ICD-10-CM | POA: Diagnosis not present

## 2024-02-19 DIAGNOSIS — K219 Gastro-esophageal reflux disease without esophagitis: Secondary | ICD-10-CM

## 2024-02-19 DIAGNOSIS — D696 Thrombocytopenia, unspecified: Secondary | ICD-10-CM

## 2024-02-19 DIAGNOSIS — J4520 Mild intermittent asthma, uncomplicated: Secondary | ICD-10-CM

## 2024-02-19 DIAGNOSIS — E785 Hyperlipidemia, unspecified: Secondary | ICD-10-CM | POA: Diagnosis not present

## 2024-02-19 DIAGNOSIS — D72819 Decreased white blood cell count, unspecified: Secondary | ICD-10-CM

## 2024-02-19 DIAGNOSIS — I10 Essential (primary) hypertension: Secondary | ICD-10-CM | POA: Diagnosis not present

## 2024-02-19 LAB — CK: Total CK: 305 U/L — ABNORMAL HIGH (ref 7–232)

## 2024-02-19 LAB — C-REACTIVE PROTEIN: CRP: <1 mg/dL (ref 0.5–20.0)

## 2024-02-19 LAB — SEDIMENTATION RATE: Sed Rate: 1 mm/h (ref 0–20)

## 2024-02-19 NOTE — Progress Notes (Signed)
 Subjective:    Patient ID: Jose Perry, male    DOB: 1941-09-21, 83 y.o.   MRN: [id number]  Patient here for  Chief Complaint  Patient presents with   Medical Management of Chronic Issues    3 month followup    HPI Here for a scheduled follow up - follow up regarding hypertension and asthma. Taking diovan  and amlodipine  2.5mg  q day. Unable to titrate up amlodipine  due to increased swelling. Hydralazine  was added. Had nose bleed 11/2023. Saw ENT - s/p cauterization. No further nose bleeds now. Saw pulmonary 12/13/23 - f/u asthma. Recommended trelegy. Did not tolerate trelegy - joint aches and decreased strength. Had f/u 01/18/24 with pulmonary. Started back on breo. Is feeling better, but still with some aching. Trying to get back in his regular exercise routine. Had f/u with cardiology 01/02/24 - stable. Continue aspirin  and crestor . No chest pain. Eating. No abdominal pain. Blood pressure higher in the am.    Past Medical History:  Diagnosis Date   Asthma    Dyspnea    Elevated blood pressure    Enlarged prostate    GERD (gastroesophageal reflux disease)    Glaucoma    History of adenomatous polyp of colon    Hx of colonic polyps    Hypertension    Leukopenia    has had an extensive w/up.     Liver hemangioma 01/24/2018   Thyroid  goiter    Past Surgical History:  Procedure Laterality Date   BACK SURGERY  2002   ruptured disc   CATARACT EXTRACTION W/PHACO Right 06/30/2020   Procedure: CATARACT EXTRACTION PHACO AND INTRAOCULAR LENS PLACEMENT (IOC) RIGHT, Malyugin 7.19 01:30.8 7.9%;  Surgeon: Annell Kidney, MD;  Location: [facility];  Service: Ophthalmology;  Laterality: Right;   COLONOSCOPY W/ POLYPECTOMY  04/24/2005, 07/06/2010, 07/08/2014   COLONOSCOPY WITH PROPOFOL  N/A 02/01/2018   Procedure: COLONOSCOPY WITH PROPOFOL ;  Surgeon: Cassie Click, MD;  Location: [facility];  Service: Endoscopy;  Laterality: N/A;   COLONOSCOPY WITH PROPOFOL  N/A 10/09/2023    Procedure: COLONOSCOPY WITH PROPOFOL ;  Surgeon: Toledo, Alphonsus Jeans, MD;  Location: [facility] END[facility]COPY;  Service: Gastroenterology;  Laterality: N/A;   ESOPHAGOGASTRODUODENOSCOPY  04/24/2005, 07/06/2010,   ESOPHAGOGASTRODUODENOSCOPY (EGD) WITH PROPOFOL  N/A 05/10/2022   Procedure: ESOPHAGOGASTRODUODENOSCOPY (EGD) WITH PROPOFOL ;  Surgeon: Toledo, Alphonsus Jeans, MD;  Location: [facility] END[facility]COPY;  Service: Gastroenterology;  Laterality: N/A;   EYE SURGERY  2009   relieve pressure from glaucoma   POLYPECTOMY  10/09/2023   Procedure: POLYPECTOMY;  Surgeon: Toledo, Alphonsus Jeans, MD;  Location: [facility];  Service: Gastroenterology;;   THYROID  SURGERY  1968   goiter   Family History  Problem Relation Age of Onset   Heart disease Mother    Alcohol abuse Father    Hyperlipidemia Father    Stroke Father    Social History   Socioeconomic History   Marital status: Married    Spouse name: Not on file   Number of children: Not on file   Years of education: Not on file   Highest education level: Some college, no degree  Occupational History   Not on file  Tobacco Use   Smoking status: Never   Smokeless tobacco: Never  Vaping Use   Vaping status: Never Used  Substance and Sexual Activity   Alcohol use: No    Alcohol/week: 0.0 standard drinks of alcohol   Drug use: No   Sexual activity: Yes  Other Topics Concern   Not on file  Social  History Narrative   married   Social Drivers of Corporate investment banker Strain: Low Risk  (10/30/2023)   Overall Financial Resource Strain (CARDIA)    Difficulty of Paying Living Expenses: Not hard at all  Food Insecurity: No Food Insecurity (10/30/2023)   Hunger Vital Sign    Worried About Running Out of Food in the Last Year: Never true    Ran Out of Food in the Last Year: Never true  Transportation Needs: No Transportation Needs (10/30/2023)   PRAPARE - Administrator, Civil Service (Medical): No    Lack of Transportation (Non-Medical): No  Physical  Activity: Sufficiently Active (10/30/2023)   Exercise Vital Sign    Days of Exercise per Week: 7 days    Minutes of Exercise per Session: 90 min  Stress: No Stress Concern Present (10/30/2023)   [employer] of Occupational Health - Occupational Stress Questionnaire    Feeling of Stress : Not at all  Social Connections: Moderately Isolated (10/30/2023)   Social Connection and Isolation Panel [NHANES]    Frequency of Communication with Friends and Family: Three times a week    Frequency of Social Gatherings with Friends and Family: Once a week    Attends Religious Services: Never    Database administrator or Organizations: No    Attends Engineer, structural: Not on file    Marital Status: Married     Review of Systems  Constitutional:  Negative for appetite change and unexpected weight change.  HENT:  Negative for congestion and sinus pressure.   Respiratory:  Negative for cough, chest tightness and shortness of breath.   Cardiovascular:  Negative for chest pain, palpitations and leg swelling.  Gastrointestinal:  Negative for abdominal pain, diarrhea, nausea and vomiting.  Genitourinary:  Negative for difficulty urinating and dysuria.  Musculoskeletal:  Positive for myalgias. Negative for joint swelling.  Skin:  Negative for color change and rash.  Neurological:  Negative for dizziness and headaches.  Psychiatric/Behavioral:  Negative for agitation and dysphoric mood.        Objective:     BP 122/68   Pulse 62   Temp (!) 97.5 F (36.4 C) (Oral)   Ht 5\' 10"  (1.778 m)   Wt 148 lb 3.2 oz (67.2 kg)   SpO2 97%   BMI 21.26 kg/m  Wt Readings from Last 3 Encounters:  02/19/24 148 lb 3.2 oz (67.2 kg)  01/18/24 152 lb 3.2 oz (69 kg)  01/02/24 154 lb (69.9 kg)    Physical Exam Vitals reviewed.  Constitutional:      General: He is not in acute distress.    Appearance: Normal appearance. He is well-developed.  HENT:     Head: Normocephalic and atraumatic.      Right Ear: External ear normal.     Left Ear: External ear normal.     Mouth/Throat:     Pharynx: No oropharyngeal exudate or posterior oropharyngeal erythema.  Eyes:     General: No scleral icterus.       Right eye: No discharge.        Left eye: No discharge.     Conjunctiva/sclera: Conjunctivae normal.  Cardiovascular:     Rate and Rhythm: Normal rate and regular rhythm.  Pulmonary:     Effort: Pulmonary effort is normal. No respiratory distress.     Breath sounds: Normal breath sounds.  Abdominal:     General: Bowel sounds are normal.     Palpations: Abdomen  is soft.     Tenderness: There is no abdominal tenderness.  Musculoskeletal:        General: No swelling or tenderness.     Cervical back: Neck supple. No tenderness.  Lymphadenopathy:     Cervical: No cervical adenopathy.  Skin:    Findings: No erythema or rash.  Neurological:     Mental Status: He is alert.  Psychiatric:        Mood and Affect: Mood normal.        Behavior: Behavior normal.         Outpatient Encounter Medications as of 02/19/2024  Medication Sig   albuterol  (VENTOLIN  HFA) 108 (90 Base) MCG/ACT inhaler Inhale 2 puffs into the lungs every 4 (four) hours as needed for wheezing or shortness of breath.   amLODipine  (NORVASC ) 5 MG tablet Take 1 tablet (5 mg total) by mouth 2 (two) times daily.   ASPIRIN  81 PO Take by mouth.   ferrous sulfate 325 (65 FE) MG EC tablet Take 325 mg by mouth daily.   finasteride  (PROSCAR ) 5 MG tablet Take 1 tablet (5 mg total) by mouth daily.   fluticasone  (FLONASE) 50 MCG/ACT nasal spray Place 2 sprays into both nostrils as needed.   fluticasone  furoate-vilanterol (BREO ELLIPTA ) 100-25 MCG/ACT AEPB Inhale 1 puff into the lungs daily.   hydrALAZINE  (APRESOLINE ) 10 MG tablet TAKE 1 TABLET BY MOUTH THREE TIMES A DAY   latanoprost (XALATAN) 0.005 % ophthalmic solution ADMINISTER 1 DROP TO THE RIGHT EYE NIGHTLY.   omeprazole  (PRILOSEC) 40 MG capsule TAKE 1 CAPSULE (40 MG  TOTAL) BY MOUTH DAILY.   rosuvastatin  (CRESTOR ) 10 MG tablet Take 1 tablet (10 mg total) by mouth daily.   tamsulosin  (FLOMAX ) 0.4 MG CAPS capsule Take 1 capsule (0.4 mg total) by mouth daily.   timolol  (TIMOPTIC ) 0.5 % ophthalmic solution Place 1 drop into the right eye 2 (two) times daily.   valsartan  (DIOVAN ) 320 MG tablet Take 1 tablet (320 mg total) by mouth every evening.   vitamin B-12 (CYANOCOBALAMIN ) 1000 MCG tablet Take by mouth.   No facility-administered encounter medications on file as of 02/19/2024.     Lab Results  Component Value Date   WBC 2.1 (L) 02/14/2024   HGB 13.1 02/14/2024   HCT 40.5 02/14/2024   PLT 128.0 (L) 02/14/2024   GLUCOSE 82 02/14/2024   CHOL 140 02/14/2024   TRIG 39.0 02/14/2024   HDL 65.20 02/14/2024   LDLCALC 67 02/14/2024   ALT 18 02/14/2024   AST 22 02/14/2024   NA 136 02/14/2024   K 4.2 02/14/2024   CL 101 02/14/2024   CREATININE 1.26 02/14/2024   BUN 13 02/14/2024   CO2 29 02/14/2024   TSH 1.83 09/11/2023   PSA 2.86 07/11/2021   INR 1.0 11/30/2023    No results found.     Assessment & Plan:  Muscle ache -     Sedimentation rate -     C-reactive protein -     CK  Primary hypertension Assessment & Plan: Currently on diovan  320mg  q day.  Taking amlodipine  2.5mg  bid at times as outlined. Had swelling with increased dose of amlodipine . Responds well to amlodipine , but cannot tolerate increasing the dose.  Discussed medication options. He wants to avoid diuretics due to increased urinary frequency. Have discussed trial of spironolactone. Still would like to avoid due to above. Avoid beta blockers due to pulse rate average 50-60. Started hydralazine  10mg  tid last visit. Still with increased am pressures.  Discussed spacing of medications. Follow pressures.     Hyperlipidemia, unspecified hyperlipidemia type  Leukopenia, unspecified type Assessment & Plan: Has been previously evaluated by hematology. Follow.  Stable - 2.1. follow.    Orders: -     CBC with Differential/Platelet; Future  Thyroid  nodule Assessment & Plan:  Followed by endocrinology - 10/2021.  Recommended f/u thyroid  ultrasound in one year.  Ultrasound 10/2022 - stable.  Recommended f/u in 2 years. Follow tsh.    Thrombocytopenia (HCC) Assessment & Plan: Follow cbc. Overall stable.    Mild intermittent asthma, unspecified whether complicated Assessment & Plan: Back on breo.   Breathing overall stable.  No increased cough.  No significant acid reflux reported.  Follow.     Gastroesophageal reflux disease, unspecified whether esophagitis present Assessment & Plan: No upper symptoms. Continue prilosec.       Dellar Fenton, MD

## 2024-02-20 ENCOUNTER — Ambulatory Visit: Payer: Self-pay | Admitting: Internal Medicine

## 2024-02-20 ENCOUNTER — Telehealth: Payer: Self-pay

## 2024-02-20 ENCOUNTER — Other Ambulatory Visit: Payer: Self-pay | Admitting: Internal Medicine

## 2024-02-20 DIAGNOSIS — D72819 Decreased white blood cell count, unspecified: Secondary | ICD-10-CM

## 2024-02-20 DIAGNOSIS — I10 Essential (primary) hypertension: Secondary | ICD-10-CM

## 2024-02-20 DIAGNOSIS — M791 Myalgia, unspecified site: Secondary | ICD-10-CM

## 2024-02-20 NOTE — Telephone Encounter (Signed)
 Noted. Per note, rash was just localized to his shoulder, was itchy and now resolved.  Per previous note instructed to hold cholesterol medication. Let me know if any changes and call with update over the next 1-2 weeks.

## 2024-02-20 NOTE — Progress Notes (Signed)
Order placed for f/u labs.  

## 2024-02-20 NOTE — Telephone Encounter (Signed)
 Copied from CRM [id number]. Topic: General - Other >> Feb 20, 2024  7:59 AM Freya Jesus wrote: Reason for CRM: Patient stated that he is requesting a call back from Dr. Thayne Fine nurse. Stated he came in yesterday and was complaining about achy joints and she asked if he had a rash and he stated no until he got home and saw the rash in the mirror.

## 2024-02-20 NOTE — Telephone Encounter (Signed)
 Pt.notified

## 2024-02-22 DIAGNOSIS — R040 Epistaxis: Secondary | ICD-10-CM | POA: Diagnosis not present

## 2024-02-22 DIAGNOSIS — H6123 Impacted cerumen, bilateral: Secondary | ICD-10-CM | POA: Diagnosis not present

## 2024-02-25 ENCOUNTER — Encounter: Payer: Self-pay | Admitting: Internal Medicine

## 2024-02-25 NOTE — Assessment & Plan Note (Signed)
 Back on breo.   Breathing overall stable.  No increased cough.  No significant acid reflux reported.  Follow.

## 2024-02-25 NOTE — Assessment & Plan Note (Signed)
 Has been previously evaluated by hematology. Follow.  Stable - 2.1. follow.

## 2024-02-25 NOTE — Assessment & Plan Note (Signed)
 Currently on diovan  320mg  q day.  Taking amlodipine  2.5mg  bid at times as outlined. Had swelling with increased dose of amlodipine . Responds well to amlodipine , but cannot tolerate increasing the dose.  Discussed medication options. He wants to avoid diuretics due to increased urinary frequency. Have discussed trial of spironolactone. Still would like to avoid due to above. Avoid beta blockers due to pulse rate average 50-60. Started hydralazine  10mg  tid last visit. Still with increased am pressures. Discussed spacing of medications. Follow pressures.

## 2024-02-25 NOTE — Assessment & Plan Note (Signed)
No upper symptoms.  Continue prilosec.  

## 2024-02-25 NOTE — Assessment & Plan Note (Addendum)
 Followed by endocrinology - 10/2021.  Recommended f/u thyroid ultrasound in one year.  Ultrasound 10/2022 - stable.  Recommended f/u in 2 years. Follow tsh.

## 2024-02-25 NOTE — Assessment & Plan Note (Signed)
Follow cbc.  Overall stable.

## 2024-02-28 DIAGNOSIS — M25812 Other specified joint disorders, left shoulder: Secondary | ICD-10-CM | POA: Diagnosis not present

## 2024-02-28 DIAGNOSIS — M25811 Other specified joint disorders, right shoulder: Secondary | ICD-10-CM | POA: Diagnosis not present

## 2024-03-05 ENCOUNTER — Other Ambulatory Visit (INDEPENDENT_AMBULATORY_CARE_PROVIDER_SITE_OTHER)

## 2024-03-05 ENCOUNTER — Telehealth: Payer: Self-pay | Admitting: Internal Medicine

## 2024-03-05 DIAGNOSIS — D72819 Decreased white blood cell count, unspecified: Secondary | ICD-10-CM | POA: Diagnosis not present

## 2024-03-05 DIAGNOSIS — I10 Essential (primary) hypertension: Secondary | ICD-10-CM | POA: Diagnosis not present

## 2024-03-05 DIAGNOSIS — M791 Myalgia, unspecified site: Secondary | ICD-10-CM

## 2024-03-05 NOTE — Telephone Encounter (Signed)
 FYI.Jose AasAaron AasPt drop off paper work for his medication Methylprenisolone 4 mg dose pack from [facility] and placed in folder.

## 2024-03-06 LAB — CBC WITH DIFFERENTIAL/PLATELET
Basophils Absolute: 0 10*3/uL (ref 0.0–0.1)
Basophils Relative: 1.3 % (ref 0.0–3.0)
Eosinophils Absolute: 0 10*3/uL (ref 0.0–0.7)
Eosinophils Relative: 0.4 % (ref 0.0–5.0)
HCT: 40.2 % (ref 39.0–52.0)
Hemoglobin: 13.1 g/dL (ref 13.0–17.0)
Lymphocytes Relative: 22.5 % (ref 12.0–46.0)
Lymphs Abs: 0.8 10*3/uL (ref 0.7–4.0)
MCHC: 32.5 g/dL (ref 30.0–36.0)
MCV: 85 fl (ref 78.0–100.0)
Monocytes Absolute: 0.3 10*3/uL (ref 0.1–1.0)
Monocytes Relative: 10.2 % (ref 3.0–12.0)
Neutro Abs: 2.2 10*3/uL (ref 1.4–7.7)
Neutrophils Relative %: 65.6 % (ref 43.0–77.0)
Platelets: 137 10*3/uL — ABNORMAL LOW (ref 150.0–400.0)
RBC: 4.72 Mil/uL (ref 4.22–5.81)
RDW: 14.8 % (ref 11.5–15.5)
WBC: 3.3 10*3/uL — ABNORMAL LOW (ref 4.0–10.5)

## 2024-03-06 LAB — BASIC METABOLIC PANEL WITH GFR
BUN: 15 mg/dL (ref 6–23)
CO2: 30 meq/L (ref 19–32)
Calcium: 9.5 mg/dL (ref 8.4–10.5)
Chloride: 102 meq/L (ref 96–112)
Creatinine, Ser: 1.22 mg/dL (ref 0.40–1.50)
GFR: 55.37 mL/min — ABNORMAL LOW (ref >60.00)
Glucose, Bld: 85 mg/dL (ref 70–99)
Potassium: 4.3 meq/L (ref 3.5–5.1)
Sodium: 137 meq/L (ref 135–145)

## 2024-03-06 LAB — CK: Total CK: 247 U/L — ABNORMAL HIGH (ref 7–232)

## 2024-03-06 NOTE — Telephone Encounter (Signed)
 FYI for you- called patient to confirm nothing needed from our office. He just wanted us  to be aware that he is on prednisone  and seeing ortho and PT for his shoulder. Nothing further needed at this time.

## 2024-03-08 ENCOUNTER — Ambulatory Visit: Payer: Self-pay | Admitting: Internal Medicine

## 2024-03-08 ENCOUNTER — Other Ambulatory Visit: Payer: Self-pay | Admitting: Internal Medicine

## 2024-03-08 DIAGNOSIS — M791 Myalgia, unspecified site: Secondary | ICD-10-CM

## 2024-03-11 DIAGNOSIS — H401123 Primary open-angle glaucoma, left eye, severe stage: Secondary | ICD-10-CM | POA: Diagnosis not present

## 2024-03-16 ENCOUNTER — Other Ambulatory Visit: Payer: Self-pay | Admitting: Internal Medicine

## 2024-03-18 DIAGNOSIS — H401112 Primary open-angle glaucoma, right eye, moderate stage: Secondary | ICD-10-CM | POA: Diagnosis not present

## 2024-03-18 DIAGNOSIS — H2512 Age-related nuclear cataract, left eye: Secondary | ICD-10-CM | POA: Diagnosis not present

## 2024-03-18 DIAGNOSIS — Z961 Presence of intraocular lens: Secondary | ICD-10-CM | POA: Diagnosis not present

## 2024-03-18 DIAGNOSIS — Z0100 Encounter for examination of eyes and vision without abnormal findings: Secondary | ICD-10-CM | POA: Diagnosis not present

## 2024-03-18 DIAGNOSIS — H401123 Primary open-angle glaucoma, left eye, severe stage: Secondary | ICD-10-CM | POA: Diagnosis not present

## 2024-03-26 DIAGNOSIS — M25511 Pain in right shoulder: Secondary | ICD-10-CM | POA: Diagnosis not present

## 2024-03-26 DIAGNOSIS — M25512 Pain in left shoulder: Secondary | ICD-10-CM | POA: Diagnosis not present

## 2024-03-27 ENCOUNTER — Other Ambulatory Visit: Payer: Self-pay | Admitting: Pulmonary Disease

## 2024-03-27 DIAGNOSIS — J4540 Moderate persistent asthma, uncomplicated: Secondary | ICD-10-CM

## 2024-03-31 DIAGNOSIS — M25512 Pain in left shoulder: Secondary | ICD-10-CM | POA: Diagnosis not present

## 2024-03-31 DIAGNOSIS — M25511 Pain in right shoulder: Secondary | ICD-10-CM | POA: Diagnosis not present

## 2024-04-03 DIAGNOSIS — M25511 Pain in right shoulder: Secondary | ICD-10-CM | POA: Diagnosis not present

## 2024-04-03 DIAGNOSIS — M25512 Pain in left shoulder: Secondary | ICD-10-CM | POA: Diagnosis not present

## 2024-04-06 ENCOUNTER — Other Ambulatory Visit: Payer: Self-pay | Admitting: Internal Medicine

## 2024-04-07 DIAGNOSIS — M25511 Pain in right shoulder: Secondary | ICD-10-CM | POA: Diagnosis not present

## 2024-04-07 DIAGNOSIS — M25512 Pain in left shoulder: Secondary | ICD-10-CM | POA: Diagnosis not present

## 2024-04-09 DIAGNOSIS — M25511 Pain in right shoulder: Secondary | ICD-10-CM | POA: Diagnosis not present

## 2024-04-09 DIAGNOSIS — M25512 Pain in left shoulder: Secondary | ICD-10-CM | POA: Diagnosis not present

## 2024-04-14 ENCOUNTER — Other Ambulatory Visit (INDEPENDENT_AMBULATORY_CARE_PROVIDER_SITE_OTHER)

## 2024-04-14 DIAGNOSIS — D72819 Decreased white blood cell count, unspecified: Secondary | ICD-10-CM

## 2024-04-14 LAB — CBC WITH DIFFERENTIAL/PLATELET
Basophils Absolute: 0 K/uL (ref 0.0–0.1)
Basophils Relative: 1.2 % (ref 0.0–3.0)
Eosinophils Absolute: 0.2 K/uL (ref 0.0–0.7)
Eosinophils Relative: 6.1 % — ABNORMAL HIGH (ref 0.0–5.0)
HCT: 40.5 % (ref 39.0–52.0)
Hemoglobin: 13.3 g/dL (ref 13.0–17.0)
Lymphocytes Relative: 28.9 % (ref 12.0–46.0)
Lymphs Abs: 0.9 K/uL (ref 0.7–4.0)
MCHC: 32.8 g/dL (ref 30.0–36.0)
MCV: 84.2 fl (ref 78.0–100.0)
Monocytes Absolute: 0.3 K/uL (ref 0.1–1.0)
Monocytes Relative: 9.1 % (ref 3.0–12.0)
Neutro Abs: 1.7 K/uL (ref 1.4–7.7)
Neutrophils Relative %: 54.7 % (ref 43.0–77.0)
Platelets: 132 K/uL — ABNORMAL LOW (ref 150.0–400.0)
RBC: 4.81 Mil/uL (ref 4.22–5.81)
RDW: 15 % (ref 11.5–15.5)
WBC: 3.1 K/uL — ABNORMAL LOW (ref 4.0–10.5)

## 2024-04-15 ENCOUNTER — Other Ambulatory Visit

## 2024-04-21 DIAGNOSIS — M25512 Pain in left shoulder: Secondary | ICD-10-CM | POA: Diagnosis not present

## 2024-04-21 DIAGNOSIS — M25511 Pain in right shoulder: Secondary | ICD-10-CM | POA: Diagnosis not present

## 2024-04-22 ENCOUNTER — Telehealth: Payer: Self-pay

## 2024-04-22 ENCOUNTER — Telehealth: Payer: Self-pay | Admitting: Pharmacist

## 2024-04-22 ENCOUNTER — Ambulatory Visit (INDEPENDENT_AMBULATORY_CARE_PROVIDER_SITE_OTHER): Admitting: Pulmonary Disease

## 2024-04-22 ENCOUNTER — Encounter: Payer: Self-pay | Admitting: Pulmonary Disease

## 2024-04-22 VITALS — BP 120/80 | HR 56 | Temp 97.8°F | Ht 70.0 in | Wt 152.0 lb

## 2024-04-22 DIAGNOSIS — J4550 Severe persistent asthma, uncomplicated: Secondary | ICD-10-CM

## 2024-04-22 DIAGNOSIS — J8283 Eosinophilic asthma: Secondary | ICD-10-CM

## 2024-04-22 DIAGNOSIS — Z9109 Other allergy status, other than to drugs and biological substances: Secondary | ICD-10-CM

## 2024-04-22 DIAGNOSIS — J3089 Other allergic rhinitis: Secondary | ICD-10-CM | POA: Diagnosis not present

## 2024-04-22 DIAGNOSIS — R0602 Shortness of breath: Secondary | ICD-10-CM

## 2024-04-22 LAB — NITRIC OXIDE: Nitric Oxide: 31

## 2024-04-22 NOTE — Telephone Encounter (Signed)
 Patient seen in office today. Dr. Tamea will start patient on Fasenra . Patient completed assistant forms. Forms faxed to pharmacy team.

## 2024-04-22 NOTE — Progress Notes (Unsigned)
 Subjective:    Patient ID: Jose Perry, male    DOB: 04-17-1941, 83 y.o.   MRN: [id number]  Patient Care Team: Glendia Shad, MD as PCP - General (Internal Medicine) Darliss Rogue, MD as PCP - Cardiology (Cardiology) Tamea Dedra CROME, MD as Consulting Physician (Pulmonary Disease)  Chief Complaint  Patient presents with  . Follow-up    Shortness of breath on exertion.     BACKGROUND/INTERVAL:  HPI  DATA 07/11/2018 PFTs: FEV1 0.77 L or 31% predicted, FVC 1.52 L or 47% predicted, FEV1/FVC 51%, there was a 40% net change on FEV1 postbronchodilator.  There was air trapping but no hyperinflation noted on lung volumes.  Consistent with severe obstructive airways disease with significant bronchodilator response 05/18/2022 coronary CTA: Lung windows nonrevealing. 05/25/2022 chest x-ray PA and lateral: Hyperinflation otherwise no abnormalities. Review of Systems A 10 point review of systems was performed and it is as noted above otherwise negative. 08/03/2022 nitric oxide : 57 ppb 08/25/2022 alpha 1: Phenotype MM, level 124 08/25/2022 IgE,Allergen panel/eosinophils: IgE 325, multiple allergens identified, no eosinophilia 09/12/2022 PFTs: FEV1 1.28 L or 44% predicted, FVC 1.93 L or 47% predicted, FEV1/FVC 66%, no significant bronchodilator response.  Pseudo restriction may be on the basis of air trapping. 06/06/2023 CBC: 900 eosinophils noted. 06/15/2023 nitirc oxide:39 ppb 07/30/2023 nitric oxide : 25 ppb  Review of Systems A 10 point review of systems was performed and it is as noted above otherwise negative.   Patient Active Problem List   Diagnosis Date Noted  . Muscle ache 02/19/2024  . Epistaxis 09/17/2023  . Swelling of lower extremity 04/13/2023  . Allergic rhinitis 02/13/2023  . Otitis externa 02/13/2023  . Diarrhea 10/30/2021  . Iron deficiency 05/05/2020  . Hyperplasia of prostate with lower urinary tract symptoms (LUTS) 07/10/2018  . Exotropia 07/10/2018  .  Blepharoconjunctivitis 07/10/2018  . Primary open angle glaucoma (POAG) 06/28/2018  . B12 deficiency 05/14/2018  . Liver hemangioma 01/24/2018  . Thrombocytopenia (HCC) 12/19/2017  . Change in stool 10/12/2017  . Asthma 08/30/2017  . Urinary urgency 05/23/2017  . Thyroid  nodule 06/13/2016  . Primary open angle glaucoma (POAG) of right eye, moderate stage 04/10/2016  . Shortness of breath 08/30/2015  . Bruit 06/14/2015  . Health care maintenance 12/13/2014  . Glaucoma 09/21/2014  . Goiter 06/28/2014  . Hypertension 06/28/2014  . Leukopenia 06/28/2014  . History of colonic polyps 06/28/2014  . Enlarged prostate 06/28/2014  . GERD (gastroesophageal reflux disease) 06/28/2014  . History of adenomatous polyp of colon 06/26/2014  . Gastrointestinal ulcer due to Helicobacter pylori 06/26/2014  . Male erectile dysfunction, unspecified 07/24/2012  . Incomplete emptying of bladder 07/24/2012  . Elevated prostate specific antigen (PSA) 07/24/2012  . Benign localized hyperplasia of prostate with urinary obstruction 07/24/2012    Social History   Tobacco Use  . Smoking status: Never  . Smokeless tobacco: Never  Substance Use Topics  . Alcohol use: No    Alcohol/week: 0.0 standard drinks of alcohol    Allergies  Allergen Reactions  . Brimonidine     Other reaction(s): Eye Redness  . Brimonidine Tartrate     Other reaction(s): Eye redness  . Brinzolamide-Brimonidine     Other reaction(s): Eye redness  . Other Other (See Comments)    Simbrinza Eye Drops gives pt Eye redness    Current Meds  Medication Sig  . albuterol  (VENTOLIN  HFA) 108 (90 Base) MCG/ACT inhaler INHALE 2 PUFFS INTO THE LUNGS EVERY 4 HOURS AS NEEDED FOR WHEEZING OR  SHORTNESS OF BREATH.  . amLODipine  (NORVASC ) 5 MG tablet Take 1 tablet (5 mg total) by mouth 2 (two) times daily.  . ASPIRIN  81 PO Take by mouth.  . ferrous sulfate 325 (65 FE) MG EC tablet Take 325 mg by mouth daily.  . finasteride  (PROSCAR ) 5 MG  tablet Take 1 tablet (5 mg total) by mouth daily.  . fluticasone  (FLONASE) 50 MCG/ACT nasal spray Place 2 sprays into both nostrils as needed.  . fluticasone  furoate-vilanterol (BREO ELLIPTA ) 100-25 MCG/ACT AEPB Inhale 1 puff into the lungs daily.  . hydrALAZINE  (APRESOLINE ) 10 MG tablet TAKE 1 TABLET BY MOUTH THREE TIMES A DAY  . latanoprost (XALATAN) 0.005 % ophthalmic solution ADMINISTER 1 DROP TO THE RIGHT EYE NIGHTLY.  . omeprazole  (PRILOSEC) 40 MG capsule TAKE 1 CAPSULE (40 MG TOTAL) BY MOUTH DAILY.  . rosuvastatin  (CRESTOR ) 10 MG tablet Take 1 tablet (10 mg total) by mouth daily.  . tamsulosin  (FLOMAX ) 0.4 MG CAPS capsule Take 1 capsule (0.4 mg total) by mouth daily.  . timolol  (TIMOPTIC ) 0.5 % ophthalmic solution Place 1 drop into the right eye 2 (two) times daily.  . valsartan  (DIOVAN ) 320 MG tablet Take 1 tablet (320 mg total) by mouth every evening.  . vitamin B-12 (CYANOCOBALAMIN ) 1000 MCG tablet Take by mouth.    Immunization History  Administered Date(s) Administered  . Fluad Quad(high Dose 65+) 08/27/2020, 07/11/2021, 06/16/2022  . Influenza, High Dose Seasonal PF 06/13/2016, 08/20/2017, 07/31/2018, 08/22/2019, 06/15/2023  . Influenza,inj,Quad PF,6+ Mos 07/20/2015  . PFIZER Comirnaty(Gray Top)Covid-19 Tri-Sucrose Vaccine 02/26/2021, 06/28/2022  . PFIZER(Purple Top)SARS-COV-2 Vaccination 10/30/2019, 11/20/2019, 07/05/2020  . Pfizer Covid-19 Vaccine Bivalent Booster 3yrs & up 07/01/2021  . Pfizer(Comirnaty)Fall Seasonal Vaccine 12 years and older 07/04/2023  . Pneumococcal Conjugate-13 06/10/2015  . Pneumococcal Polysaccharide-23 06/30/2013  . Respiratory Syncytial Virus Vaccine,Recomb Aduvanted(Arexvy) 10/07/2022  . Tdap 10/25/2021  . Zoster Recombinant(Shingrix) 07/06/2018, 08/15/2021  . Zoster, Live 06/30/2013        Objective:     BP 120/80 (BP Location: Left Arm, Patient Position: Sitting, Cuff Size: Normal)   Pulse (!) 56   Temp 97.8 F (36.6 C) (Oral)   Ht  5' 10 (1.778 m)   Wt 152 lb (68.9 kg)   SpO2 98%   BMI 21.81 kg/m   SpO2: 98 %  GENERAL: Thin, fit appearing gentleman, looks younger than stated age, fully ambulatory, no conversational dyspnea HEAD: Normocephalic, atraumatic.  EYES: Pupils equal, round, reactive to light.  No scleral icterus.  Strabismus with exotropia of right eye. MOUTH: Dentition intact, oral mucosa moist.  No thrush. NECK: Supple. No thyromegaly. Trachea midline. No JVD.  No adenopathy. PULMONARY: Good air entry bilaterally.  No adventitious sounds. CARDIOVASCULAR: S1 and S2. Regular rate and rhythm.  No rubs, murmurs or gallops heard. ABDOMEN: Benign. MUSCULOSKELETAL: No joint deformity, no clubbing, no edema.  NEUROLOGIC: No overt focal deficit, no gait disturbance, speech is fluent. SKIN: Intact,warm,dry. PSYCH: Mood and behavior normal.  Lab Results  Component Value Date   NITRICOXIDE 31 04/22/2024  *Trend:39>>25>>30>>40>>31       Assessment & Plan:     ICD-10-CM   1. Severe persistent asthma without complication  J45.50 Nitric oxide     2. Eosinophilic asthma  G17.16     3. Allergic rhinitis due to other allergic trigger, unspecified seasonality  J30.89     4. Environmental allergies  Z91.09       Orders Placed This Encounter  Procedures  . Nitric oxide     No  orders of the defined types were placed in this encounter.      Advised if symptoms do not improve or worsen, to please contact office for sooner follow up or seek emergency care.    I spent xxx minutes of dedicated to the care of this patient on the date of this encounter to include pre-visit review of records, face-to-face time with the patient discussing conditions above, post visit ordering of testing, clinical documentation with the electronic health record, making appropriate referrals as documented, and communicating necessary findings to members of the patients care team.     C. Leita Sanders, MD Advanced  Bronchoscopy PCCM [facility]    *This note was generated using voice recognition software/Dragon and/or AI transcription program.  Despite best efforts to proofread, errors can occur which can change the meaning. Any transcriptional errors that result from this process are unintentional and may not be fully corrected at the time of dictation.

## 2024-04-22 NOTE — Telephone Encounter (Signed)
 Received Fasenra  new start paperwork via Onbase  SAVEd a Prior Authorization request to [facility] for FASENRA  via CoverMyMeds. Will update once we receive a response. Submission pending OV note to be signed  Key: [id number]   Sherry Pennant, PharmD, MPH, BCPS, CPP Clinical Pharmacist (Rheumatology and Pulmonology)

## 2024-04-22 NOTE — Telephone Encounter (Signed)
 Received Fasenra  new start paperwork via Onbase. We will work on benefits investgation

## 2024-04-22 NOTE — Patient Instructions (Addendum)
 VISIT SUMMARY:  You came in today for a follow-up visit regarding your severe persistent asthma. Your breathing is well-controlled on your current medication, and you are not experiencing any nighttime symptoms. We discussed your recent aches, which have improved with physical therapy.  YOUR PLAN:  -SEVERE PERSISTENT ASTHMA: Severe persistent asthma is a long-term condition where the airways in your lungs are always inflamed and can become narrowed, making it hard to breathe.  You are can currently on Breo 100 mcg daily and have been unable to tolerate higher doses. We will start you on Fasenra  injections, which can help reduce inflammation and control your asthma symptoms. You will receive these injections monthly for the first few months, then every two months. We will coordinate with a specialty pharmacy for the medication, and you will be educated on how to use the Fasenra  auto pen. If needed, you have the option to receive the injections at an infusion center.  INSTRUCTIONS:  Continue taking Breo 100 mcg daily. You will start Fasenra  injections monthly for the first few months, then every two months. We will coordinate with a specialty pharmacy for the medication, and you will be educated on how to use the Fasenra  auto pen. If needed, you have the option to receive the injections at an infusion center.  We will see in follow-up in 2 months time call sooner should any new problems arise.

## 2024-04-23 MED ORDER — BENRALIZUMAB 30 MG/ML ~~LOC~~ SOAJ: SUBCUTANEOUS | Status: DC

## 2024-04-25 NOTE — Telephone Encounter (Signed)
 Submitted a Prior Authorization request to [facility] for FASENRA  via CoverMyMeds. Will update once we receive a response.  Key: CREOLA Sherry Pennant, PharmD, MPH, BCPS, CPP Clinical Pharmacist (Rheumatology and Pulmonology)

## 2024-04-30 ENCOUNTER — Encounter: Payer: Self-pay | Admitting: Hematology and Oncology

## 2024-04-30 ENCOUNTER — Other Ambulatory Visit (HOSPITAL_COMMUNITY): Payer: Self-pay

## 2024-04-30 NOTE — Telephone Encounter (Incomplete)
 Received notification from [facility] regarding a prior authorization for FASENRA . Authorization has been APPROVED from 03/28/2024 to 10/24/2024. Approval letter sent to scan center.  Per test claim, copay for 28 days supply is [phone]  Patient must fill through [facility]: [phone]  Phone # [phone]  Patient appears to be eligible for copay card but will likely need to call to get enrolled  Sherry Pennant, PharmD, MPH, BCPS, CPP Clinical Pharmacist (Rheumatology and Pulmonology)

## 2024-05-06 NOTE — Telephone Encounter (Signed)
 ATC patient to schedule Fasenra  new start at [facility] office on 05/16/2024. Unable to reach. Left VM requesting return call

## 2024-05-07 NOTE — Telephone Encounter (Signed)
 Called patient regarding Fasenra  new start appointment. Patient expressed concern with starting the medication stating I don't want to take medication I may not need. Patient did not have any questions regarding Fasenra . Advised patient to give us  a call if he decides to move forward with Fasenra . Patient has clinic's phone number.   Deleta Colt PharmD Candidate [phone]  [facility]

## 2024-05-09 DIAGNOSIS — M25511 Pain in right shoulder: Secondary | ICD-10-CM | POA: Diagnosis not present

## 2024-05-09 DIAGNOSIS — M25512 Pain in left shoulder: Secondary | ICD-10-CM | POA: Diagnosis not present

## 2024-05-12 ENCOUNTER — Telehealth: Payer: Self-pay

## 2024-05-12 DIAGNOSIS — M25512 Pain in left shoulder: Secondary | ICD-10-CM | POA: Diagnosis not present

## 2024-05-12 DIAGNOSIS — M25511 Pain in right shoulder: Secondary | ICD-10-CM | POA: Diagnosis not present

## 2024-05-12 NOTE — Telephone Encounter (Unsigned)
 Copied from CRM #[id number]. Topic: Clinical - Prescription Issue >> May 12, 2024  1:20 PM Chantha C wrote: Reason for CRM: [facility] [phone] states patietn tried to get med refill at another place, however benralizumab  (FASENRA  PEN) 30 MG/ML prefilled autoinjector has to be filled with their company for insurance coverage. Please send medication and call back if need.   [facility], [place] - [address] [place] [address] Phone:[phone]:[phone]

## 2024-05-13 NOTE — Telephone Encounter (Signed)
 Copied from CRM [id number]. Topic: Appointments - Scheduling Inquiry for Clinic >> May 12, 2024  4:27 PM Isabell A wrote: Reason for CRM: Patient would like to schedule an appointment for his allergy shots.   Callback number: [phone]

## 2024-05-13 NOTE — Telephone Encounter (Addendum)
 We are aware. Patient did not want to start Fasenra . Reached back out to patient - he is ready to start. He will be out of town through 05/22/2024  Scheduled for Fasenra  new start on 05/27/24. Will use sample  Braydn Carneiro, PharmD, MPH, BCPS, CPP Clinical Pharmacist (Rheumatology and Pulmonology)

## 2024-05-23 NOTE — Progress Notes (Deleted)
 HPI Patient presents today to [facility] to see pharmacy team for [facility] new start.  Past medical history includes severe persistent asthma (eosinophilic type), allergic rhinitis, GERD, and HTN.   Last ED visit for respiratory symptoms was in Jan 2025. Unable to tolerate higher doses of Breo inhaler. Elevated allergy cells and positive allergen panel suggest potential benefit from biologic therapy.   Respiratory Medications Current regimen:  - Breo Ellipta 128mcg-25mcg/act Inhale 1 puff daily  - Ventolin HFA 144mcg/act Inhale 2 puffs every 4 hours PRN wheezing, shortness of breath   Tried in past: Trelegy (side effects: joint aches) Patient reports {Adherence challenges yes no:[id number]::adherence challenges,no known adherence challenges}  OBJECTIVE Allergies  Allergen Reactions   Brimonidine     Other reaction(s): Eye Redness   Brimonidine Tartrate     Other reaction(s): Eye redness   Brinzolamide-Brimonidine     Other reaction(s): Eye redness   Other Other (See Comments)    Simbrinza Eye Drops gives pt Eye redness    Outpatient Encounter Medications as of 05/27/2024  Medication Sig   albuterol (VENTOLIN HFA) 108 (90 Base) MCG/ACT inhaler INHALE 2 PUFFS INTO THE LUNGS EVERY 4 HOURS AS NEEDED FOR WHEEZING OR SHORTNESS OF BREATH.   amLODipine (NORVASC) 5 MG tablet Take 1 tablet (5 mg total) by mouth 2 (two) times daily.   ASPIRIN 81 PO Take by mouth.   benralizumab (FASENRA PEN) 30 MG/ML prefilled autoinjector 30 mg SQ every 4 weeks x 3 doses then 30 mg SQ every 8 weeks.  For severe eosinophilic asthma.   ferrous sulfate 325 (65 FE) MG EC tablet Take 325 mg by mouth daily.   finasteride (PROSCAR) 5 MG tablet Take 1 tablet (5 mg total) by mouth daily.   fluticasone (FLONASE) 50 MCG/ACT nasal spray Place 2 sprays into both nostrils as needed.   fluticasone furoate-vilanterol (BREO ELLIPTA) 100-25 MCG/ACT AEPB Inhale 1 puff into the lungs daily.   hydrALAZINE (APRESOLINE)  10 MG tablet TAKE 1 TABLET BY MOUTH THREE TIMES A DAY   latanoprost (XALATAN) 0.005 % ophthalmic solution ADMINISTER 1 DROP TO THE RIGHT EYE NIGHTLY.   omeprazole (PRILOSEC) 40 MG capsule TAKE 1 CAPSULE (40 MG TOTAL) BY MOUTH DAILY.   rosuvastatin (CRESTOR) 10 MG tablet Take 1 tablet (10 mg total) by mouth daily.   tamsulosin (FLOMAX) 0.4 MG CAPS capsule Take 1 capsule (0.4 mg total) by mouth daily.   timolol (TIMOPTIC) 0.5 % ophthalmic solution Place 1 drop into the right eye 2 (two) times daily.   valsartan (DIOVAN) 320 MG tablet Take 1 tablet (320 mg total) by mouth every evening.   vitamin B-12 (CYANOCOBALAMIN) 1000 MCG tablet Take by mouth.   No facility-administered encounter medications on file as of 05/27/2024.     Immunization History  Administered Date(s) Administered   Fluad Quad(high Dose 65+) 08/27/2020, 07/11/2021, 06/16/2022   Influenza, High Dose Seasonal PF 06/13/2016, 08/20/2017, 07/31/2018, 08/22/2019, 06/15/2023   Influenza,inj,Quad PF,6+ Mos 07/20/2015   PFIZER Comirnaty(Gray Top)Covid-19 Tri-Sucrose Vaccine 02/26/2021, 06/28/2022   PFIZER(Purple Top)SARS-COV-2 Vaccination 10/30/2019, 11/20/2019, 07/05/2020   Pfizer Covid-19 Vaccine Bivalent Booster 57yrs & up 07/01/2021   Pfizer(Comirnaty)Fall Seasonal Vaccine 12 years and older 07/04/2023   Pneumococcal Conjugate-13 06/10/2015   Pneumococcal Polysaccharide-23 06/30/2013   Respiratory Syncytial Virus Vaccine,Recomb Aduvanted(Arexvy) 10/07/2022   Tdap 10/25/2021   Zoster Recombinant(Shingrix) 07/06/2018, 08/15/2021   Zoster, Live 06/30/2013     PFTs    Latest Ref Rng & Units 09/12/2022    2:54 PM  PFT  Results  FVC-Pre L 1.93   FVC-Predicted Pre % 47   FVC-Post L 1.95   FVC-Predicted Post % 48   Pre FEV1/FVC % % 66   Post FEV1/FCV % % 65   FEV1-Pre L 1.28   FEV1-Predicted Pre % 44   FEV1-Post L 1.28   DLCO uncorrected ml/min/mmHg 20.15   DLCO UNC% % 82   DLVA Predicted % 115   TLC L 5.27   TLC %  Predicted % 74   RV % Predicted % 119      Eosinophils Most recent blood eosinophil count was 200 cells/microL taken on 04/14/24.   IgE: 325 on 08/25/22   Assessment   Biologics training for benralizumab  (Fasenra )  Goals of therapy: Mechanism of action: humanized afucosylated, monoclonal antibody (IgG1, kappa) that binds to the alpha subunit of the interleukin-5 receptor. IL-5 is the major cytokine responsible for the growth and differentiation, recruitment, activation, and survival of eosinophils (a cell type associated with inflammation and an important component in the pathogenesis of asthma) Reviewed that Fasenra  is add-on medication and patient must continue maintenance inhaler regimen. Response to therapy: may take 4 months to determine efficacy. Discussed that patients generally feel improvement sooner than 4 months.  Side effects: antibody development (13%), headache (8%), pharyngitis (5%), fever (3%), injection site reaction (2.2%)  Dose: 30 mg subcutaneously at Week 0, Week 4, Week 8, then 30mg  every 8 weeks thereafter  Administration/Storage:   Reviewed administration sites of thigh or abdomen (at least 2-3 inches away from abdomen). Reviewed the upper arm is only appropriate if caregiver is administering injection  Do not shake the pen/syringe as this could lead to product foaming or precipitation.  Reviewed storage of medication in refrigerator. Reviewed that Fasenra  can be stored at room temperature in unopened carton for up to 14 days.   Access: Approval of Fasenra  through: insurance Patient enrolled into copay card program *** to help with copay assistance.   Patient self-administered Fasenra  30mg /mL in {injsitedsg:28167} using sample Fasenra  30mg /mL autoinjector pen NDC: *** Lot: *** Expiration: ***  Patient monitored for 30 minutes for adverse reaction.  Patient tolerated ***.  Injection site noted. {injectionreaction:30756}  Medication Reconciliation  A  drug regimen assessment was performed, including review of allergies, interactions, disease-state management, dosing and immunization history. Medications were reviewed with the patient, including name, instructions, indication, goals of therapy, potential side effects, importance of adherence, and safe use.  Drug interaction(s): none noted  Immunizations  Patient is indicated for the influenzae, pneumonia, and shingles vaccinations. Patient has received *** COVID19 vaccines.   PLAN Continue Fasenra  30mg  at Week 4 on *** and Week 8 on ***. Then continue Fasenra  30mg  every 8 weeks thereafter starting on ***. [facility]: [phone]. Patient provided with pharmacy phone number and advised to call later this week to schedule shipment to home. Patient provided with copay card information to provide to pharmacy if quoted copay exceeds $5 per month. Continue maintenance asthma regimen of: Breo Ellipta  18mcg-25mcg/act Inhale 1 puff daily, Ventolin  HFA 140mcg/act Inhale 2 puffs every 4 hours PRN wheezing, shortness of breath   All questions encouraged and answered.  Instructed patient to reach out with any further questions or concerns.  Thank you for allowing pharmacy to participate in this patient's care.  This appointment required *** minutes of patient care (this includes precharting, chart review, review of results, face-to-face care, etc.).

## 2024-05-26 DIAGNOSIS — M25512 Pain in left shoulder: Secondary | ICD-10-CM | POA: Diagnosis not present

## 2024-05-26 DIAGNOSIS — M25511 Pain in right shoulder: Secondary | ICD-10-CM | POA: Diagnosis not present

## 2024-05-27 ENCOUNTER — Other Ambulatory Visit: Admitting: Pharmacist

## 2024-05-28 DIAGNOSIS — M25511 Pain in right shoulder: Secondary | ICD-10-CM | POA: Diagnosis not present

## 2024-05-28 DIAGNOSIS — M25512 Pain in left shoulder: Secondary | ICD-10-CM | POA: Diagnosis not present

## 2024-06-02 NOTE — Telephone Encounter (Signed)
 Appt with pharmacy team for new start Fasenra  on 05/27/24 was cancelled (patient). ATC patient to reschedule. LVM with contact number to return call.   Aleck Puls, PharmD, BCPS Clinical Pharmacist  [facility]

## 2024-06-12 ENCOUNTER — Other Ambulatory Visit: Payer: Self-pay | Admitting: Pulmonary Disease

## 2024-06-12 DIAGNOSIS — J4540 Moderate persistent asthma, uncomplicated: Secondary | ICD-10-CM

## 2024-06-18 ENCOUNTER — Other Ambulatory Visit: Payer: Self-pay | Admitting: Cardiology

## 2024-06-23 ENCOUNTER — Other Ambulatory Visit: Payer: Self-pay | Admitting: Internal Medicine

## 2024-06-23 ENCOUNTER — Ambulatory Visit: Payer: Self-pay | Admitting: Internal Medicine

## 2024-06-23 ENCOUNTER — Ambulatory Visit (INDEPENDENT_AMBULATORY_CARE_PROVIDER_SITE_OTHER)

## 2024-06-23 ENCOUNTER — Ambulatory Visit: Admitting: Pulmonary Disease

## 2024-06-23 ENCOUNTER — Other Ambulatory Visit (INDEPENDENT_AMBULATORY_CARE_PROVIDER_SITE_OTHER)

## 2024-06-23 ENCOUNTER — Encounter: Payer: Self-pay | Admitting: Pulmonary Disease

## 2024-06-23 ENCOUNTER — Telehealth: Payer: Self-pay

## 2024-06-23 VITALS — BP 122/74 | HR 55 | Temp 97.6°F | Ht 70.0 in | Wt 153.2 lb

## 2024-06-23 DIAGNOSIS — J4550 Severe persistent asthma, uncomplicated: Secondary | ICD-10-CM | POA: Diagnosis not present

## 2024-06-23 DIAGNOSIS — M791 Myalgia, unspecified site: Secondary | ICD-10-CM

## 2024-06-23 DIAGNOSIS — J3089 Other allergic rhinitis: Secondary | ICD-10-CM

## 2024-06-23 DIAGNOSIS — I10 Essential (primary) hypertension: Secondary | ICD-10-CM | POA: Diagnosis not present

## 2024-06-23 DIAGNOSIS — J8283 Eosinophilic asthma: Secondary | ICD-10-CM

## 2024-06-23 LAB — BASIC METABOLIC PANEL WITH GFR
BUN: 16 mg/dL (ref 6–23)
CO2: 29 meq/L (ref 19–32)
Calcium: 10 mg/dL (ref 8.4–10.5)
Chloride: 103 meq/L (ref 96–112)
Creatinine, Ser: 1.25 mg/dL (ref 0.40–1.50)
GFR: 53.66 mL/min — ABNORMAL LOW (ref >60.00)
Glucose, Bld: 84 mg/dL (ref 70–99)
Potassium: 4.6 meq/L (ref 3.5–5.1)
Sodium: 139 meq/L (ref 135–145)

## 2024-06-23 LAB — HEPATIC FUNCTION PANEL
ALT: 16 U/L (ref 0–53)
AST: 23 U/L (ref 0–37)
Albumin: 4.3 g/dL (ref 3.5–5.2)
Alkaline Phosphatase: 69 U/L (ref 39–117)
Bilirubin, Direct: 0.1 mg/dL (ref 0.0–0.3)
Total Bilirubin: 0.5 mg/dL (ref 0.2–1.2)
Total Protein: 6.3 g/dL (ref 6.0–8.3)

## 2024-06-23 LAB — NITRIC OXIDE: Nitric Oxide: 37

## 2024-06-23 LAB — LIPID PANEL
Cholesterol: 184 mg/dL (ref 0–200)
HDL: 72 mg/dL (ref >39.00)
LDL Cholesterol: 106 mg/dL — ABNORMAL HIGH (ref 0–99)
NonHDL: 111.86
Total CHOL/HDL Ratio: 3
Triglycerides: 31 mg/dL (ref 0.0–149.0)
VLDL: 6.2 mg/dL (ref 0.0–40.0)

## 2024-06-23 LAB — TSH: TSH: 2.03 u[IU]/mL (ref 0.35–5.50)

## 2024-06-23 LAB — CK: Total CK: 361 U/L — ABNORMAL HIGH (ref 17–232)

## 2024-06-23 NOTE — Telephone Encounter (Signed)
 Patient seen in office today. Patient is requesting to restart Fasenra  process. Pharmacy team, please reach out to him.   Thank you,

## 2024-06-23 NOTE — Patient Instructions (Signed)
 VISIT SUMMARY:  You came in today for a follow-up visit regarding your severe persistent asthma and allergic rhinitis. We discussed your current treatment plan and the next steps to better manage your conditions.  YOUR PLAN:  -SEVERE PERSISTENT ASTHMA: Severe persistent asthma is a long-term condition where your airways are always inflamed, making it hard to breathe. You are currently managing it with Breo, you have declined Fasenra . We discussed restarting the Fasenra  shot process to help control your allergen response and prevent further airway damage. You will need to schedule your first Fasenra  shot with observation in [place], and we will educate you on how to self-administer the subsequent shots. We will follow up in two months to see how you are doing.  -ALLERGIC RHINITIS: Allergic rhinitis is an allergic reaction that causes sneezing, congestion, and a runny nose. It is contributing to your asthma flare-ups. We will manage this with allergy shots as part of your asthma treatment plan to help reduce these symptoms and prevent asthma exacerbations.  INSTRUCTIONS:  Please restart the allergy shot process with the pharmacy team and schedule your first allergy shot with observation in [place]. We will educate you on how to self-administer the subsequent shots. Follow up in two months to assess your progress.

## 2024-06-23 NOTE — Progress Notes (Signed)
 Subjective:    Patient ID: Jose Perry, male    DOB: March 22, 1941, 83 y.o.   MRN: [id number]  Patient Care Team: Glendia Shad, MD as PCP - General (Internal Medicine) Darliss Rogue, MD as PCP - Cardiology (Cardiology) Tamea Dedra CROME, MD as Consulting Physician (Pulmonary Disease)  Chief Complaint  Patient presents with   Asthma    Shortness of breath on exertion.     BACKGROUND/INTERVAL:Patient is an 83 year old lifelong never smoker with medical history as noted below, who presents for follow-up of shortness of breath.  Patient was last seen on 22 April 2024.  He has asthma with eosinophilic and allergic phenotype.  He has poor understanding of his disease process.  This is a scheduled follow-up visit.   HPI Discussed the use of AI scribe software for clinical note transcription with the patient, who gave verbal consent to proceed.  History of Present Illness   Jose Perry is an 83 year old male with severe persistent asthma who presents for follow-up.  He manages his asthma with Breo and as needed albuterol . He has opted against Fasenra , as he only requires additional medication during ragweed season every two to three years, and only for a day or two.  He recalls that increasing the dose of Breo previously caused him difficulties, though he did not specify the nature of these difficulties. He humorously describes his extensive allergies as being 'allergic to the world'.  He was initially set up to start Fasenra  on August 19th, but did not proceed as planned.  We had long discussion about airway remodeling effects from poorly controlled asthma.  Despite inhalers he still has intermediate level of type II inflammation present.  He is now more open to initiate Fasenra .    DATA 07/11/2018 PFTs: FEV1 0.77 L or 31% predicted, FVC 1.52 L or 47% predicted, FEV1/FVC 51%, there was a 40% net change on FEV1 postbronchodilator.  There was air trapping but no hyperinflation noted  on lung volumes.  Consistent with severe obstructive airways disease with significant bronchodilator response 05/18/2022 coronary CTA: Lung windows nonrevealing. 05/25/2022 chest x-ray PA and lateral: Hyperinflation otherwise no abnormalities. Review of Systems A 10 point review of systems was performed and it is as noted above otherwise negative. 08/03/2022 nitric oxide : 57 ppb 08/25/2022 alpha 1: Phenotype MM, level 124 08/25/2022 IgE,Allergen panel/eosinophils: IgE 325, multiple allergens identified, no eosinophilia 09/12/2022 PFTs: FEV1 1.28 L or 44% predicted, FVC 1.93 L or 47% predicted, FEV1/FVC 66%, no significant bronchodilator response.  Pseudo restriction may be on the basis of air trapping. 06/06/2023 CBC: 900 eosinophils noted. 06/15/2023 nitirc oxide:39 ppb 07/30/2023 nitric oxide : 25 ppb 09/13/2023 nitric oxide : 30 ppb 01/18/2024 nitric oxide : 40 ppb  Review of Systems A 10 point review of systems was performed and it is as noted above otherwise negative.   Patient Active Problem List   Diagnosis Date Noted   Elevated CK 06/26/2024   Muscle weakness 06/26/2024   Muscle ache 02/19/2024   Epistaxis 09/17/2023   Swelling of lower extremity 04/13/2023   Allergic rhinitis 02/13/2023   Otitis externa 02/13/2023   Diarrhea 10/30/2021   Iron deficiency 05/05/2020   Hyperplasia of prostate with lower urinary tract symptoms (LUTS) 07/10/2018   Exotropia 07/10/2018   Blepharoconjunctivitis 07/10/2018   Primary open angle glaucoma (POAG) 06/28/2018   B12 deficiency 05/14/2018   Liver hemangioma 01/24/2018   Thrombocytopenia 12/19/2017   Change in stool 10/12/2017   Asthma 08/30/2017   Urinary urgency 05/23/2017  Thyroid  nodule 06/13/2016   Primary open angle glaucoma (POAG) of right eye, moderate stage 04/10/2016   Shortness of breath 08/30/2015   Bruit 06/14/2015   Health care maintenance 12/13/2014   Glaucoma 09/21/2014   Goiter 06/28/2014   Hypertension 06/28/2014    Leukopenia 06/28/2014   History of colonic polyps 06/28/2014   Enlarged prostate 06/28/2014   GERD (gastroesophageal reflux disease) 06/28/2014   History of adenomatous polyp of colon 06/26/2014   Gastrointestinal ulcer due to Helicobacter pylori 06/26/2014   Male erectile dysfunction, unspecified 07/24/2012   Incomplete emptying of bladder 07/24/2012   Elevated prostate specific antigen (PSA) 07/24/2012   Benign localized hyperplasia of prostate with urinary obstruction 07/24/2012    Social History   Tobacco Use   Smoking status: Never   Smokeless tobacco: Never  Substance Use Topics   Alcohol use: No    Alcohol/week: 0.0 standard drinks of alcohol    Allergies  Allergen Reactions   Brimonidine     Other reaction(s): Eye Redness   Brimonidine Tartrate     Other reaction(s): Eye redness   Brinzolamide-Brimonidine     Other reaction(s): Eye redness   Other Other (See Comments)    Simbrinza Eye Drops gives pt Eye redness    Current Meds  Medication Sig   albuterol  (VENTOLIN  HFA) 108 (90 Base) MCG/ACT inhaler INHALE 2 PUFFS INTO THE LUNGS EVERY 4 HOURS AS NEEDED FOR WHEEZING OR SHORTNESS OF BREATH.   amLODipine  (NORVASC ) 5 MG tablet TAKE 1 TABLET BY MOUTH TWICE A DAY   ASPIRIN  81 PO Take by mouth.   finasteride  (PROSCAR ) 5 MG tablet Take 1 tablet (5 mg total) by mouth daily.   fluticasone  (FLONASE) 50 MCG/ACT nasal spray Place 2 sprays into both nostrils as needed. (Patient not taking: Reported on 07/01/2024)   fluticasone  furoate-vilanterol (BREO ELLIPTA ) 100-25 MCG/ACT AEPB Inhale 1 puff into the lungs daily.   hydrALAZINE  (APRESOLINE ) 10 MG tablet TAKE 1 TABLET BY MOUTH THREE TIMES A DAY   latanoprost (XALATAN) 0.005 % ophthalmic solution ADMINISTER 1 DROP TO THE RIGHT EYE NIGHTLY.   omeprazole  (PRILOSEC) 40 MG capsule TAKE 1 CAPSULE (40 MG TOTAL) BY MOUTH DAILY.   tamsulosin  (FLOMAX ) 0.4 MG CAPS capsule Take 1 capsule (0.4 mg total) by mouth daily.   timolol   (TIMOPTIC ) 0.5 % ophthalmic solution Place 1 drop into the right eye 2 (two) times daily.   valsartan  (DIOVAN ) 320 MG tablet Take 1 tablet (320 mg total) by mouth every evening.   vitamin B-12 (CYANOCOBALAMIN ) 1000 MCG tablet Take by mouth.   [DISCONTINUED] benralizumab  (FASENRA  PEN) 30 MG/ML prefilled autoinjector 30 mg SQ every 4 weeks x 3 doses then 30 mg SQ every 8 weeks.  For severe eosinophilic asthma.   [DISCONTINUED] ferrous sulfate 325 (65 FE) MG EC tablet Take 325 mg by mouth daily.   [DISCONTINUED] rosuvastatin  (CRESTOR ) 10 MG tablet Take 1 tablet (10 mg total) by mouth daily.    Immunization History  Administered Date(s) Administered   Fluad Quad(high Dose 65+) 08/27/2020, 07/11/2021, 06/16/2022   INFLUENZA, HIGH DOSE SEASONAL PF 06/13/2016, 08/20/2017, 07/31/2018, 08/22/2019, 06/15/2023   Influenza,inj,Quad PF,6+ Mos 07/20/2015   PFIZER Comirnaty(Gray Top)Covid-19 Tri-Sucrose Vaccine 02/26/2021, 06/28/2022   PFIZER(Purple Top)SARS-COV-2 Vaccination 10/30/2019, 11/20/2019, 07/05/2020   Pfizer Covid-19 Vaccine Bivalent Booster 36yrs & up 07/01/2021   Pfizer(Comirnaty)Fall Seasonal Vaccine 12 years and older 07/04/2023   Pneumococcal Conjugate-13 06/10/2015   Pneumococcal Polysaccharide-23 06/30/2013   Respiratory Syncytial Virus Vaccine,Recomb Aduvanted(Arexvy) 10/07/2022   Tdap 10/25/2021  Zoster Recombinant(Shingrix) 07/06/2018, 08/15/2021   Zoster, Live 06/30/2013        Objective:     BP 122/74   Pulse (!) 55   Temp 97.6 F (36.4 C) (Temporal)   Ht 5' 10 (1.778 m)   Wt 153 lb 3.2 oz (69.5 kg)   SpO2 98%   BMI 21.98 kg/m   SpO2: 98 %  GENERAL: HEAD: Normocephalic, atraumatic.  EYES: Pupils equal, round, reactive to light.  No scleral icterus.  MOUTH:  NECK: Supple. No thyromegaly. Trachea midline. No JVD.  No adenopathy. PULMONARY: Good air entry bilaterally.  No adventitious sounds. CARDIOVASCULAR: S1 and S2. Regular rate and rhythm.  ABDOMEN:GENERAL:  Thin, fit appearing gentleman, looks younger than stated age, fully ambulatory, no conversational dyspnea HEAD: Normocephalic, atraumatic.  EYES: Pupils equal, round, reactive to light.  No scleral icterus.  Strabismus with exotropia of right eye. MOUTH: Dentition intact, oral mucosa moist.  No thrush. NECK: Supple. No thyromegaly. Trachea midline. No JVD.  No adenopathy. PULMONARY: Good air entry bilaterally.  No adventitious sounds. CARDIOVASCULAR: S1 and S2. Regular rate and rhythm.  No rubs, murmurs or gallops heard. ABDOMEN: Benign. MUSCULOSKELETAL: No joint deformity, no clubbing, no edema.  NEUROLOGIC: No overt focal deficit, no gait disturbance, speech is fluent. SKIN: Intact,warm,dry. PSYCH: Mood and behavior normal. MUSCULOSKELETAL: No joint deformity, no clubbing, no edema.  NEUROLOGIC:  SKIN: Intact,warm,dry. PSYCH:  Lab Results  Component Value Date   NITRICOXIDE 37 06/23/2024  *Intermediate level of type I inflammation present *Trend:39>>25>>30>>40>>31 >>37 ppb      Assessment & Plan:     ICD-10-CM   1. Severe persistent asthma without complication  J45.50 Nitric oxide     2. Eosinophilic asthma  G17.16     3. Allergic rhinitis due to other allergic trigger, unspecified seasonality  J30.89       Orders Placed This Encounter  Procedures   Nitric oxide     Discussion:    Severe persistent asthma Severe persistent asthma with high airway inflammation. Current treatment includes Breo and as needed albuterol . Previous attempt to increase Breo dosage caused difficulties.  Biologic injection was initially declined but reconsidered due to persistent inflammation and risk of airway remodeling leading to COPD-like symptoms.  Biologics are expected to help control allergen response and prevent airway remodeling. - Restart Fasenra  administration process with pharmacy team - Schedule first allergy shot with observation in [facility] - Educate on self-administration of  subsequent shots - Follow up in two months to assess progress  Allergic rhinitis Allergic rhinitis with elevated eosinophil. Allergic to multiple environmental allergens, contributing to asthma exacerbations.  Biologic are part of the management plan to control allergic rhinitis and reduce asthma exacerbations. - Manage allergic rhinitis with allergy shots as part of asthma treatment     Advised if symptoms do not improve or worsen, to please contact office for sooner follow up or seek emergency care.    I spent 32 minutes of dedicated to the care of this patient on the date of this encounter to include pre-visit review of records, face-to-face time with the patient discussing conditions above, post visit ordering of testing, clinical documentation with the electronic health record, making appropriate referrals as documented, and communicating necessary findings to members of the patients care team.     C. Leita Sanders, MD Advanced Bronchoscopy PCCM [facility]    *This note was generated using voice recognition software/Dragon and/or AI transcription program.  Despite best efforts to proofread, errors can occur which can  change the meaning. Any transcriptional errors that result from this process are unintentional and may not be fully corrected at the time of dictation.

## 2024-06-23 NOTE — Progress Notes (Signed)
Order placed for add on lab.  °

## 2024-06-24 ENCOUNTER — Encounter: Payer: Self-pay | Admitting: Hematology and Oncology

## 2024-06-24 ENCOUNTER — Other Ambulatory Visit (HOSPITAL_COMMUNITY): Payer: Self-pay

## 2024-06-24 NOTE — Telephone Encounter (Signed)
 Enrolled patient in Fasenra  copay card. Processing info is:  BIN: [id number] PCN: [id number] Group: [id number] ID: [id number]  Test claim still comes back as $1644.25 for a 28 day supply. There is a phone number on the claim to call [employer] to obtain additional benefit. Called this number with the patient on the line but they stated the patient's plan is not enrolled to receive [employer] benefit.   I advised the patient our other route is for him to call Fasenra  back and have them convert his copay card to a debit card. He said he is occupied at the moment but will reach out to them later and keep me updated.

## 2024-06-24 NOTE — Telephone Encounter (Signed)
 Patient scheduled for new start Fasenra  on 07/01/24 at [facility]. Location. After most recent OV with Dr. Tamea, he is agreeable to start medication.   Aleck Puls, PharmD, BCPS Clinical Pharmacist  [facility]

## 2024-06-24 NOTE — Telephone Encounter (Signed)
 Updated communication in separate thread. See specialty med biv (Fasenra ) thread.   Aleck Puls, PharmD, BCPS Clinical Pharmacist  [facility]

## 2024-06-24 NOTE — Telephone Encounter (Signed)
 Attempted to enroll the patient in Fasenra  copay card online but they stated he is already enrolled in their system. Spoke to the patient and advised him to call Fasenra  [phone] to get the card info. Gave him my direct line to call back with the processing info.

## 2024-06-25 ENCOUNTER — Other Ambulatory Visit: Payer: Self-pay

## 2024-06-25 ENCOUNTER — Other Ambulatory Visit (HOSPITAL_COMMUNITY): Payer: Self-pay

## 2024-06-25 MED ORDER — BENRALIZUMAB 30 MG/ML ~~LOC~~ SOAJ
SUBCUTANEOUS | 0 refills | Status: DC
Start: 2024-06-25 — End: 2024-06-26

## 2024-06-25 NOTE — Progress Notes (Signed)
 Specialty Pharmacy Initial Fill Coordination Note  Jose Perry is a 83 y.o. male contacted today regarding initial fill of specialty medication(s) Benralizumab  (FASENRA  PEN)   Patient requested Courier to Provider Office   Delivery date: 06/30/24   Verified address: [address], [place], [place] [address]   Medication will be filled on 9/18.   Patient is aware of $0 copayment.

## 2024-06-25 NOTE — Telephone Encounter (Signed)
 Order for Fasenra  entered.

## 2024-06-25 NOTE — Addendum Note (Signed)
 Addended by: Lacy Sofia L on: 06/25/2024 03:27 PM   Modules accepted: Orders

## 2024-06-25 NOTE — Telephone Encounter (Signed)
 Patient reached out to Fasenra  and they were able to change it on their end so the copay card picked up the copay. Test claim went through for $0.

## 2024-06-26 ENCOUNTER — Ambulatory Visit (INDEPENDENT_AMBULATORY_CARE_PROVIDER_SITE_OTHER): Admitting: Internal Medicine

## 2024-06-26 ENCOUNTER — Other Ambulatory Visit (HOSPITAL_COMMUNITY): Payer: Self-pay

## 2024-06-26 ENCOUNTER — Other Ambulatory Visit: Payer: Self-pay

## 2024-06-26 VITALS — BP 122/74 | HR 65 | Resp 16 | Ht 70.0 in | Wt 151.8 lb

## 2024-06-26 DIAGNOSIS — E041 Nontoxic single thyroid nodule: Secondary | ICD-10-CM | POA: Diagnosis not present

## 2024-06-26 DIAGNOSIS — J4520 Mild intermittent asthma, uncomplicated: Secondary | ICD-10-CM

## 2024-06-26 DIAGNOSIS — I10 Essential (primary) hypertension: Secondary | ICD-10-CM

## 2024-06-26 DIAGNOSIS — K219 Gastro-esophageal reflux disease without esophagitis: Secondary | ICD-10-CM

## 2024-06-26 DIAGNOSIS — R748 Abnormal levels of other serum enzymes: Secondary | ICD-10-CM

## 2024-06-26 DIAGNOSIS — Z860100 Personal history of colon polyps, unspecified: Secondary | ICD-10-CM | POA: Diagnosis not present

## 2024-06-26 DIAGNOSIS — D696 Thrombocytopenia, unspecified: Secondary | ICD-10-CM | POA: Diagnosis not present

## 2024-06-26 DIAGNOSIS — M6281 Muscle weakness (generalized): Secondary | ICD-10-CM | POA: Diagnosis not present

## 2024-06-26 DIAGNOSIS — E611 Iron deficiency: Secondary | ICD-10-CM

## 2024-06-26 LAB — CBC WITH DIFFERENTIAL/PLATELET
Basophils Absolute: 0 K/uL (ref 0.0–0.1)
Basophils Relative: 1.1 % (ref 0.0–3.0)
Eosinophils Absolute: 0.2 K/uL (ref 0.0–0.7)
Eosinophils Relative: 7.2 % — ABNORMAL HIGH (ref 0.0–5.0)
HCT: 41.9 % (ref 39.0–52.0)
Hemoglobin: 13.4 g/dL (ref 13.0–17.0)
Lymphocytes Relative: 30.1 % (ref 12.0–46.0)
Lymphs Abs: 0.9 K/uL (ref 0.7–4.0)
MCHC: 32 g/dL (ref 30.0–36.0)
MCV: 86.4 fl (ref 78.0–100.0)
Monocytes Absolute: 0.3 K/uL (ref 0.1–1.0)
Monocytes Relative: 8.5 % (ref 3.0–12.0)
Neutro Abs: 1.6 K/uL (ref 1.4–7.7)
Neutrophils Relative %: 53.1 % (ref 43.0–77.0)
Platelets: 133 K/uL — ABNORMAL LOW (ref 150.0–400.0)
RBC: 4.85 Mil/uL (ref 4.22–5.81)
RDW: 14.5 % (ref 11.5–15.5)
WBC: 2.9 K/uL — ABNORMAL LOW (ref 4.0–10.5)

## 2024-06-26 LAB — CK: Total CK: 385 U/L — ABNORMAL HIGH (ref 17–232)

## 2024-06-26 LAB — SEDIMENTATION RATE: Sed Rate: 2 mm/h (ref 0–20)

## 2024-06-26 LAB — C-REACTIVE PROTEIN: CRP: <1 mg/dL (ref 0.5–20.0)

## 2024-06-26 MED ORDER — BENRALIZUMAB 30 MG/ML ~~LOC~~ SOAJ
SUBCUTANEOUS | 0 refills | Status: DC
Start: 2024-06-26 — End: 2024-07-01
  Filled 2024-06-26: qty 1, 28d supply, fill #0

## 2024-06-26 NOTE — Progress Notes (Signed)
 Subjective:    Patient ID: Jose Perry, male    DOB: 1940/11/10, 83 y.o.   MRN: [id number]  Patient here for  Chief Complaint  Patient presents with   Medical Management of Chronic Issues    HPI Here for a scheduled follow up - follow up regarding hypertension and asthma. Taking diovan  and amlodipine  2.5mg  q day. Unable to titrate up amlodipine  due to increased swelling. Hydralazine  was added. Has been having increased shoulder pain. Has seen ortho and going to PT. Recent labs - elevated CK. Discussed. He reports he has always stayed active and exercises. States 4 months ago, would do 240 push ups. Now states he is only able to do 50-60 push ups. Also unable to do as many squats. Also has noticed his walk is slower and feels his balance may not be as good. Also has noticed he is unable to lift as much weight, stating previous weight - 70-80lbs, now 40-50lbs. Decreased number of curls.  No fever. No rash. No headache. Is off crestor .  Had f/u with Dr Tamea 06/23/24 - f/u severe persistent asthma. Recommended to continue Breo and start fasenra .    Past Medical History:  Diagnosis Date   Asthma    Dyspnea    Elevated blood pressure    Enlarged prostate    GERD (gastroesophageal reflux disease)    Glaucoma    History of adenomatous polyp of colon    Hx of colonic polyps    Hypertension    Leukopenia    has had an extensive w/up.     Liver hemangioma 01/24/2018   Thyroid  goiter    Past Surgical History:  Procedure Laterality Date   BACK SURGERY  2002   ruptured disc   CATARACT EXTRACTION W/PHACO Right 06/30/2020   Procedure: CATARACT EXTRACTION PHACO AND INTRAOCULAR LENS PLACEMENT (IOC) RIGHT, M[place]ugin 7.19 01:30.8 7.9%;  Surgeon: Mittie Gaskin, MD;  Location: [facility];  Service: Ophthalmology;  Laterality: Right;   COLONOSCOPY W/ POLYPECTOMY  04/24/2005, 07/06/2010, 07/08/2014   COLONOSCOPY WITH PROPOFOL  N/A 02/01/2018   Procedure: COLONOSCOPY WITH PROPOFOL ;   Surgeon: Viktoria Lamar DASEN, MD;  Location: [facility];  Service: Endoscopy;  Laterality: N/A;   COLONOSCOPY WITH PROPOFOL  N/A 10/09/2023   Procedure: COLONOSCOPY WITH PROPOFOL ;  Surgeon: Toledo, Ladell POUR, MD;  Location: [facility] ENDOSCOPY;  Service: Gastroenterology;  Laterality: N/A;   ESOPHAGOGASTRODUODENOSCOPY  04/24/2005, 07/06/2010,   ESOPHAGOGASTRODUODENOSCOPY (EGD) WITH PROPOFOL  N/A 05/10/2022   Procedure: ESOPHAGOGASTRODUODENOSCOPY (EGD) WITH PROPOFOL ;  Surgeon: Toledo, Ladell POUR, MD;  Location: [facility] ENDOSCOPY;  Service: Gastroenterology;  Laterality: N/A;   EYE SURGERY  2009   relieve pressure from glaucoma   POLYPECTOMY  10/09/2023   Procedure: POLYPECTOMY;  Surgeon: Toledo, Ladell POUR, MD;  Location: [facility] ENDOSCOPY;  Service: Gastroenterology;;   THYROID  SURGERY  1968   goiter   Family History  Problem Relation Age of Onset   Heart disease Mother    Alcohol abuse Father    Hyperlipidemia Father    Stroke Father    Social History   Socioeconomic History   Marital status: Married    Spouse name: Not on file   Number of children: Not on file   Years of education: Not on file   Highest education level: 12th grade  Occupational History   Not on file  Tobacco Use   Smoking status: Never   Smokeless tobacco: Never  Vaping Use   Vaping status: Never Used  Substance and Sexual Activity   Alcohol  use: No    Alcohol/week: 0.0 standard drinks of alcohol   Drug use: No   Sexual activity: Yes  Other Topics Concern   Not on file  Social History Narrative   married   Social Drivers of Corporate investment banker Strain: Low Risk  (06/22/2024)   Overall Financial Resource Strain (CARDIA)    Difficulty of Paying Living Expenses: Not hard at all  Food Insecurity: No Food Insecurity (06/22/2024)   Hunger Vital Sign    Worried About Running Out of Food in the Last Year: Never true    Ran Out of Food in the Last Year: Never true  Transportation Needs: No Transportation Needs  (06/22/2024)   PRAPARE - Administrator, Civil Service (Medical): No    Lack of Transportation (Non-Medical): No  Physical Activity: Sufficiently Active (10/30/2023)   Exercise Vital Sign    Days of Exercise per Week: 7 days    Minutes of Exercise per Session: 90 min  Stress: No Stress Concern Present (10/30/2023)   [employer] of Occupational Health - Occupational Stress Questionnaire    Feeling of Stress : Not at all  Social Connections: Unknown (06/22/2024)   Social Connection and Isolation Panel    Frequency of Communication with Friends and Family: Twice a week    Frequency of Social Gatherings with Friends and Family: Never    Attends Religious Services: Patient declined    Database administrator or Organizations: Patient declined    Attends Engineer, structural: Not on file    Marital Status: Married     Review of Systems  Constitutional:  Negative for appetite change and unexpected weight change.  HENT:  Negative for congestion and sinus pressure.   Respiratory:  Negative for cough, chest tightness and shortness of breath.   Cardiovascular:  Negative for chest pain, palpitations and leg swelling.  Gastrointestinal:  Negative for abdominal pain, diarrhea, nausea and vomiting.  Genitourinary:  Negative for difficulty urinating and dysuria.  Musculoskeletal:        Aching - as outlined.   Skin:  Negative for color change and rash.  Neurological:  Negative for dizziness and headaches.  Psychiatric/Behavioral:  Negative for agitation and dysphoric mood.        Objective:     BP 122/74   Pulse 65   Resp 16   Ht 5' 10 (1.778 m)   Wt 151 lb 12.8 oz (68.9 kg)   SpO2 99%   BMI 21.78 kg/m  Wt Readings from Last 3 Encounters:  06/26/24 151 lb 12.8 oz (68.9 kg)  06/23/24 153 lb 3.2 oz (69.5 kg)  04/22/24 152 lb (68.9 kg)    Physical Exam Vitals reviewed.  Constitutional:      General: He is not in acute distress.    Appearance: Normal  appearance. He is well-developed.  HENT:     Head: Normocephalic and atraumatic.     Right Ear: External ear normal.     Left Ear: External ear normal.     Mouth/Throat:     Pharynx: No oropharyngeal exudate or posterior oropharyngeal erythema.  Eyes:     General: No scleral icterus.       Right eye: No discharge.        Left eye: No discharge.     Conjunctiva/sclera: Conjunctivae normal.  Cardiovascular:     Rate and Rhythm: Normal rate and regular rhythm.  Pulmonary:     Effort: Pulmonary effort is normal. No  respiratory distress.     Breath sounds: Normal breath sounds.  Abdominal:     General: Bowel sounds are normal.     Palpations: Abdomen is soft.     Tenderness: There is no abdominal tenderness.  Musculoskeletal:        General: No swelling or tenderness.     Cervical back: Neck supple. No tenderness.  Lymphadenopathy:     Cervical: No cervical adenopathy.  Skin:    Findings: No erythema or rash.  Neurological:     Mental Status: He is alert.  Psychiatric:        Mood and Affect: Mood normal.        Behavior: Behavior normal.         Outpatient Encounter Medications as of 06/26/2024  Medication Sig   albuterol  (VENTOLIN  HFA) 108 (90 Base) MCG/ACT inhaler INHALE 2 PUFFS INTO THE LUNGS EVERY 4 HOURS AS NEEDED FOR WHEEZING OR SHORTNESS OF BREATH.   amLODipine  (NORVASC ) 5 MG tablet TAKE 1 TABLET BY MOUTH TWICE A DAY   ASPIRIN  81 PO Take by mouth.   benralizumab  (FASENRA  PEN) 30 MG/ML prefilled autoinjector Inject 30mg  in the skin on week 0 in clinic, and again at week 4 and week 8. Then inject 30mg  every 8 weeks thereafter.  Courier to pulm: [address], [address], [place] [address]. Appt on 07/01/24   ferrous sulfate 325 (65 FE) MG EC tablet Take 325 mg by mouth daily.   finasteride  (PROSCAR ) 5 MG tablet Take 1 tablet (5 mg total) by mouth daily.   fluticasone  (FLONASE) 50 MCG/ACT nasal spray Place 2 sprays into both nostrils as needed.   fluticasone   furoate-vilanterol (BREO ELLIPTA ) 100-25 MCG/ACT AEPB Inhale 1 puff into the lungs daily.   hydrALAZINE  (APRESOLINE ) 10 MG tablet TAKE 1 TABLET BY MOUTH THREE TIMES A DAY   latanoprost (XALATAN) 0.005 % ophthalmic solution ADMINISTER 1 DROP TO THE RIGHT EYE NIGHTLY.   omeprazole  (PRILOSEC) 40 MG capsule TAKE 1 CAPSULE (40 MG TOTAL) BY MOUTH DAILY.   rosuvastatin  (CRESTOR ) 10 MG tablet Take 1 tablet (10 mg total) by mouth daily.   tamsulosin  (FLOMAX ) 0.4 MG CAPS capsule Take 1 capsule (0.4 mg total) by mouth daily.   timolol  (TIMOPTIC ) 0.5 % ophthalmic solution Place 1 drop into the right eye 2 (two) times daily.   valsartan  (DIOVAN ) 320 MG tablet Take 1 tablet (320 mg total) by mouth every evening.   vitamin B-12 (CYANOCOBALAMIN ) 1000 MCG tablet Take by mouth.   [DISCONTINUED] benralizumab  (FASENRA  PEN) 30 MG/ML prefilled autoinjector Inject contents of one pen (30 mg) in the skin at week 0 in clinic, and again at week 4 and week 8. Then inject contents of one pen (30mg ) every 8 weeks thereafter. Courier to pulm: [address], [address], [place] [address]. Appt on 07/01/24.   No facility-administered encounter medications on file as of 06/26/2024.     Lab Results  Component Value Date   WBC 2.9 (L) 06/26/2024   HGB 13.4 06/26/2024   HCT 41.9 06/26/2024   PLT 133.0 (L) 06/26/2024   GLUCOSE 84 06/23/2024   CHOL 184 06/23/2024   TRIG 31.0 06/23/2024   HDL 72.00 06/23/2024   LDLCALC 106 (H) 06/23/2024   ALT 16 06/23/2024   AST 23 06/23/2024   NA 139 06/23/2024   K 4.6 06/23/2024   CL 103 06/23/2024   CREATININE 1.25 06/23/2024   BUN 16 06/23/2024   CO2 29 06/23/2024   TSH  2.03 06/23/2024   PSA 2.86 07/11/2021   INR 1.0 11/30/2023       Assessment & Plan:  Elevated CK Assessment & Plan: Unclear etiology. Noticed change in his work out as outlined. No rash. No headache. Previous respiratory infections, but no respiratory symptoms now. No joint swelling. Off crestor . Will  recheck ck to confirm stable. Also check esr and crp. Further w/up pending results. Discussed possible f/u with rheumatology.   Orders: -     Sedimentation rate -     C-reactive protein -     CK -     CBC with Differential/Platelet  Muscle weakness Assessment & Plan: No focal weakness noted on exam. Work out change as outlined. Aching. Off crestor . Check esr and crp. Recheck ck. Further w/up pending results.   Orders: -     Sedimentation rate -     C-reactive protein -     CBC with Differential/Platelet  Mild intermittent asthma, unspecified whether complicated Assessment & Plan:  Had f/u with Dr Tamea 06/23/24 - f/u severe persistent asthma. Recommended to continue Breo and start fasenra .    Gastroesophageal reflux disease, unspecified whether esophagitis present Assessment & Plan: No upper symptoms. Continue prilosec.    History of colonic polyps Assessment & Plan: Colonoscopy 10/09/23 - pathology - [facility]  Colon Polyp - TUBULAR ADENOMA, INFLAMED (MULTIPLE FRAGMENTS). NEGATIVE FOR HIGH-GRADE DYSPLASIA OR MALIGNANCY    Primary hypertension Assessment & Plan: Currently on diovan  320mg  q day.  Taking amlodipine  2.5mg  bid at times as outlined. Had swelling with increased dose of amlodipine . Responds well to amlodipine , but cannot tolerate increasing the dose. Also taking and tolerating hydralazine . Follow pressures. Follow metabolic panel.      Iron deficiency Assessment & Plan: Saw GI. Colonoscopy 2024 as outlined.  Follow cbc and iron studies.    Thyroid  nodule Assessment & Plan:  Followed by endocrinology - 10/2021.  Recommended f/u thyroid  ultrasound in one year.  Ultrasound 10/2022 - stable.  Recommended f/u in 2 years. Follow tsh.    Thrombocytopenia (HCC) Assessment & Plan: Follow cbc. Overall stable.       Allena Hamilton, MD

## 2024-06-26 NOTE — Addendum Note (Signed)
 Addended by: Alison Breeding L on: 06/26/2024 09:28 AM   Modules accepted: Orders

## 2024-06-27 ENCOUNTER — Other Ambulatory Visit: Payer: Self-pay

## 2024-06-29 ENCOUNTER — Ambulatory Visit: Payer: Self-pay | Admitting: Internal Medicine

## 2024-06-29 ENCOUNTER — Encounter: Payer: Self-pay | Admitting: Internal Medicine

## 2024-06-29 DIAGNOSIS — R748 Abnormal levels of other serum enzymes: Secondary | ICD-10-CM

## 2024-06-29 DIAGNOSIS — M6281 Muscle weakness (generalized): Secondary | ICD-10-CM

## 2024-06-29 NOTE — Assessment & Plan Note (Signed)
 No focal weakness noted on exam. Work out change as outlined. Aching. Off crestor . Check esr and crp. Recheck ck. Further w/up pending results.

## 2024-06-29 NOTE — Assessment & Plan Note (Signed)
 Saw GI. Colonoscopy 2024 as outlined.  Follow cbc and iron studies.

## 2024-06-29 NOTE — Assessment & Plan Note (Signed)
Follow cbc.  Overall stable.

## 2024-06-29 NOTE — Assessment & Plan Note (Signed)
 Currently on diovan  320mg  q day.  Taking amlodipine  2.5mg  bid at times as outlined. Had swelling with increased dose of amlodipine . Responds well to amlodipine , but cannot tolerate increasing the dose. Also taking and tolerating hydralazine . Follow pressures. Follow metabolic panel.

## 2024-06-29 NOTE — Assessment & Plan Note (Signed)
 Colonoscopy 10/09/23 - pathology - [facility]ending  Colon Polyp - TUBULAR ADENOMA, INFLAMED (MULTIPLE FRAGMENTS). NEGATIVE FOR HIGH-GRADE DYSPLASIA OR MALIGNANCY

## 2024-06-29 NOTE — Assessment & Plan Note (Signed)
 Followed by endocrinology - 10/2021.  Recommended f/u thyroid ultrasound in one year.  Ultrasound 10/2022 - stable.  Recommended f/u in 2 years. Follow tsh.

## 2024-06-29 NOTE — Assessment & Plan Note (Signed)
 Unclear etiology. Noticed change in his work out as outlined. No rash. No headache. Previous respiratory infections, but no respiratory symptoms now. No joint swelling. Off crestor . Will recheck ck to confirm stable. Also check esr and crp. Further w/up pending results. Discussed possible f/u with rheumatology.

## 2024-06-29 NOTE — Assessment & Plan Note (Signed)
 Had f/u with Dr Tamea 06/23/24 - f/u severe persistent asthma. Recommended to continue Breo and start fasenra .

## 2024-06-29 NOTE — Assessment & Plan Note (Signed)
No upper symptoms.  Continue prilosec.  

## 2024-06-30 NOTE — Telephone Encounter (Signed)
Order placed for rheumatology referral.

## 2024-07-01 ENCOUNTER — Ambulatory Visit (INDEPENDENT_AMBULATORY_CARE_PROVIDER_SITE_OTHER)

## 2024-07-01 ENCOUNTER — Other Ambulatory Visit: Payer: Self-pay

## 2024-07-01 DIAGNOSIS — J8283 Eosinophilic asthma: Secondary | ICD-10-CM

## 2024-07-01 DIAGNOSIS — Z7189 Other specified counseling: Secondary | ICD-10-CM

## 2024-07-01 MED ORDER — BENRALIZUMAB 30 MG/ML ~~LOC~~ SOAJ
SUBCUTANEOUS | 0 refills | Status: DC
  Filled 2024-07-01: qty 3, fill #0
  Filled 2024-07-17: qty 1, 28d supply, fill #0

## 2024-07-01 NOTE — Progress Notes (Signed)
 Patient started Fasenra  in clinic on 07/01/24. Able to self-administer. Accompanied by spouse.   PLAN Continue Fasenra  30mg  at Week 4 on 07/29/24 and Week 8 on 08/26/24. Then continue Fasenra  30mg  every 8 weeks thereafter starting on 10/21/24. Rx sent to: [facility]: [phone] . Patient provided with pharmacy phone number. Continue maintenance asthma regimen of: Breo Ellipta  100/33mcg/act (Inhale 1 puff into the lungs daily)    All questions encouraged and answered.  Instructed patient to reach out with any further questions or concerns.  Aleck Puls, PharmD, BCPS Clinical Pharmacist  [facility]

## 2024-07-01 NOTE — Patient Instructions (Signed)
 Your next Fasenra  dose is due on Week 4: 07/29/24, Week 8: 08/26/24, and every 8 weeks thereafter starting on 10/21/24.   CONTINUE Breo Ellipta  100/81mcg/act (Inhale 1 puff into the lungs daily)   Your prescription will be shipped from [facility]. Their phone number is [phone]. Someone will call to schedule shipment and confirm address. They will mail your medication to your home.  Your copay should be affordable. You have been enrolled in the Fasenra  Copay Card.  BIN: [id number] PCN: [id number] Group: [id number] ID: [id number]  You will need to be seen by your provider in 3 to 4 months to assess how Fasenra  is working for you. You have a follow-up appointment scheduled with Dr. Tamea on 09/09/24.   Stay up to date on all routine vaccines: influenza, pneumonia, COVID19, Shingles  How to manage an injection site reaction: Remember the 5 C's: COUNTER - leave on the counter at least 30 minutes but up to overnight to bring medication to room temperature. This may help prevent stinging with injection  COLD - place something cold (like an ice gel pack or cold water bottle) on the injection site just before cleansing with alcohol. This may help reduce pain CLARITIN - use Claritin (generic name is loratadine) for the first two weeks of treatment or the day of, the day before, and the day after injecting. This will help to minimize injection site reactions CORTISONE CREAM - apply if injection site is irritated and itching CALL ME - if injection site reaction is bigger than the size of your fist, looks infected, blisters, or if you develop hives

## 2024-07-01 NOTE — Progress Notes (Signed)
 HPI Patient presents today to [facility] to see pharmacy team for Fasenra  new start.  Patient has severe persistent asthma. Followed by Dr. Tamea.   Respiratory Medications Current regimen: Breo Ellipta  100/67mcg/act (Inhale 1 puff into the lungs daily)   Patient reports no known adherence challenges  OBJECTIVE Allergies  Allergen Reactions   Brimonidine     Other reaction(s): Eye Redness   Brimonidine Tartrate     Other reaction(s): Eye redness   Brinzolamide-Brimonidine     Other reaction(s): Eye redness   Other Other (See Comments)    Simbrinza Eye Drops gives pt Eye redness    Outpatient Encounter Medications as of 07/01/2024  Medication Sig   albuterol  (VENTOLIN  HFA) 108 (90 Base) MCG/ACT inhaler INHALE 2 PUFFS INTO THE LUNGS EVERY 4 HOURS AS NEEDED FOR WHEEZING OR SHORTNESS OF BREATH.   amLODipine  (NORVASC ) 5 MG tablet TAKE 1 TABLET BY MOUTH TWICE A DAY   ASPIRIN  81 PO Take by mouth.   benralizumab  (FASENRA  PEN) 30 MG/ML prefilled autoinjector Inject 30mg  in the skin on week 0 in clinic, and again at week 4 and week 8. Then inject 30mg  every 8 weeks thereafter.  Courier to pulm: [address], [address], [place] [address]. Appt on 07/01/24   finasteride  (PROSCAR ) 5 MG tablet Take 1 tablet (5 mg total) by mouth daily.   fluticasone  (FLONASE) 50 MCG/ACT nasal spray Place 2 sprays into both nostrils as needed.   fluticasone  furoate-vilanterol (BREO ELLIPTA ) 100-25 MCG/ACT AEPB Inhale 1 puff into the lungs daily.   hydrALAZINE  (APRESOLINE ) 10 MG tablet TAKE 1 TABLET BY MOUTH THREE TIMES A DAY   latanoprost (XALATAN) 0.005 % ophthalmic solution ADMINISTER 1 DROP TO THE RIGHT EYE NIGHTLY.   omeprazole  (PRILOSEC) 40 MG capsule TAKE 1 CAPSULE (40 MG TOTAL) BY MOUTH DAILY.   tamsulosin  (FLOMAX ) 0.4 MG CAPS capsule Take 1 capsule (0.4 mg total) by mouth daily.   timolol  (TIMOPTIC ) 0.5 % ophthalmic solution Place 1 drop into the right eye 2 (two) times daily.    valsartan  (DIOVAN ) 320 MG tablet Take 1 tablet (320 mg total) by mouth every evening.   vitamin B-12 (CYANOCOBALAMIN ) 1000 MCG tablet Take by mouth.   No facility-administered encounter medications on file as of 07/01/2024.     Immunization History  Administered Date(s) Administered   Fluad Quad(high Dose 65+) 08/27/2020, 07/11/2021, 06/16/2022   INFLUENZA, HIGH DOSE SEASONAL PF 06/13/2016, 08/20/2017, 07/31/2018, 08/22/2019, 06/15/2023   Influenza,inj,Quad PF,6+ Mos 07/20/2015   PFIZER Comirnaty(Gray Top)Covid-19 Tri-Sucrose Vaccine 02/26/2021, 06/28/2022   PFIZER(Purple Top)SARS-COV-2 Vaccination 10/30/2019, 11/20/2019, 07/05/2020   Pfizer Covid-19 Vaccine Bivalent Booster 29yrs & up 07/01/2021   Pfizer(Comirnaty)Fall Seasonal Vaccine 12 years and older 07/04/2023   Pneumococcal Conjugate-13 06/10/2015   Pneumococcal Polysaccharide-23 06/30/2013   Respiratory Syncytial Virus Vaccine,Recomb Aduvanted(Arexvy) 10/07/2022   Tdap 10/25/2021   Zoster Recombinant(Shingrix) 07/06/2018, 08/15/2021   Zoster, Live 06/30/2013     PFTs    Latest Ref Rng & Units 09/12/2022    2:54 PM  PFT Results  FVC-Pre L 1.93   FVC-Predicted Pre % 47   FVC-Post L 1.95   FVC-Predicted Post % 48   Pre FEV1/FVC % % 66   Post FEV1/FCV % % 65   FEV1-Pre L 1.28   FEV1-Predicted Pre % 44   FEV1-Post L 1.28   DLCO uncorrected ml/min/mmHg 20.15   DLCO UNC% % 82   DLVA Predicted % 115   TLC L 5.27   TLC % Predicted %  74   RV % Predicted % 119      Eosinophils Most recent blood eosinophil count was 200 cells/microL taken on 06/26/24.   IgE: 325 on 08/25/22  Assessment   Biologics training for benralizumab  (Fasenra )  Goals of therapy: Mechanism of action: humanized afucosylated, monoclonal antibody (IgG1, kappa) that binds to the alpha subunit of the interleukin-5 receptor. IL-5 is the major cytokine responsible for the growth and differentiation, recruitment, activation, and survival of eosinophils (a  cell type associated with inflammation and an important component in the pathogenesis of asthma) Reviewed that Fasenra  is add-on medication and patient must continue maintenance inhaler regimen. Response to therapy: may take 4 months to determine efficacy. Discussed that patients generally feel improvement sooner than 4 months.  Side effects: antibody development (13%), headache (8%), pharyngitis (5%), injection site reaction (2.2%)  Dose: 30 mg subcutaneously at Week 0, Week 4, Week 8, then 30mg  every 8 weeks thereafter  Administration/Storage:   Reviewed administration sites of thigh or abdomen (at least 2-3 inches away from belly button). Reviewed the upper arm is only appropriate if caregiver is administering injection  Do not shake the pen/syringe as this could lead to product foaming or precipitation.  Reviewed storage of medication in refrigerator. Reviewed that Fasenra  can be stored at room temperature in unopened carton for up to 14 days.  Side effects: headache (19%), injection site reaction (7-15%), antibody development (6%), backache (5%), fatigue (5%)  Access: Approval of Fasenra  through: insurance Patient was enrolled in Fasenra  copay card.  BIN: [id number] PCN: PDMI Group: [id number] ID: [id number]   Patient self-administered Fasenra  30mg /mL in right lower abdomen using sample Fasenra  30mg /mL autoinjector pen NDC: 9689-8169-69 Lot: [id number] Expiration: 2027-11  Patient monitored for 30 minutes for adverse reaction.  Patient tolerated well.  Injection site noted. No swelling or redness noted.    Medication Reconciliation  A drug regimen assessment was performed, including review of allergies, interactions, disease-state management, dosing and immunization history. Medications were reviewed with the patient, including name, instructions, indication, goals of therapy, potential side effects, importance of adherence, and safe use.  Drug interaction(s): none  noted  PLAN Continue Fasenra  30mg  at Week 4 on 07/29/24 and Week 8 on 08/26/24. Then continue Fasenra  30mg  every 8 weeks thereafter starting on 10/21/24. Rx sent to: [facility]: [phone] . Patient provided with pharmacy phone number. Continue maintenance asthma regimen of: Breo Ellipta  100/37mcg/act (Inhale 1 puff into the lungs daily)   All questions encouraged and answered.  Instructed patient to reach out with any further questions or concerns.  Thank you for allowing pharmacy to participate in this patient's care.  This appointment required 45 minutes of patient care (this includes precharting, chart review, review of results, face-to-face care, etc.).

## 2024-07-03 NOTE — Telephone Encounter (Unsigned)
 Copied from CRM [id number]. Topic: Referral - Question >> Jul 03, 2024  1:23 PM Jose Perry wrote: Reason for CRM: Patient requesting a callback in ref to a referral the doctor is going to submit.

## 2024-07-04 NOTE — Progress Notes (Signed)
 Return call to patient and informed him that the referral to Rheumatology has been placed. Patient states he has changed his mind and does not want the referral any longer, but he does want to know if it is safe for him to received Flu and other vaccines since he is declining the Rheumatologist. Please advise?

## 2024-07-17 ENCOUNTER — Other Ambulatory Visit: Payer: Self-pay

## 2024-07-17 ENCOUNTER — Other Ambulatory Visit: Payer: Self-pay | Admitting: Pharmacy Technician

## 2024-07-17 NOTE — Progress Notes (Signed)
 Specialty Pharmacy Refill Coordination Note  Jose Perry is a 83 y.o. male contacted today regarding refills of specialty medication(s) Benralizumab  (FASENRA  PEN)   Patient requested Delivery   Delivery date: 07/25/24   Verified address: [address]  [place]   Medication will be filled on 07/24/24.

## 2024-07-24 ENCOUNTER — Other Ambulatory Visit: Payer: Self-pay

## 2024-07-24 ENCOUNTER — Telehealth: Payer: Self-pay

## 2024-07-24 ENCOUNTER — Other Ambulatory Visit (HOSPITAL_COMMUNITY): Payer: Self-pay

## 2024-07-24 DIAGNOSIS — J8283 Eosinophilic asthma: Secondary | ICD-10-CM

## 2024-07-24 MED ORDER — BENRALIZUMAB 30 MG/ML ~~LOC~~ SOAJ
SUBCUTANEOUS | 0 refills | Status: AC
Start: 2024-07-24 — End: ?

## 2024-07-24 NOTE — Telephone Encounter (Signed)
 Received notification via specialty pharmacy encounter that pt must fill through [facility]. New rx has been sent, will disenroll pt from specialty pharmacy services. Called pt to notify him, will send him a message with [facility] phone number so he can reach out to schedule shipment.

## 2024-07-24 NOTE — Progress Notes (Signed)
 Rx has been sent to [facility]. Pt has been notified and disenrolled from specialty pharmacy services

## 2024-07-24 NOTE — Progress Notes (Signed)
 Rx for Fasenra  sent to [facility] due to insurance requirement.  Aleck Puls, PharmD, BCPS, CPP Clinical Pharmacist  [facility]

## 2024-07-24 NOTE — Progress Notes (Signed)
 Pt must fill through [facility]. Rx has been sent to them and pt has been notified to contact them.

## 2024-07-25 NOTE — Telephone Encounter (Signed)
 Received a call from the pt who said [facility] and [facility]'t [facility]. [facility] who stated they did [facility] rx but it is currently being reviewed by the pharmacist and takes 24-48 hours to process. She said once it's reviewed they will reach out to the pt to schedule shipment.  Called the pt back to advise him of above, he said he is due for his dose on Wednesday. I told him if he doesn't hear anything from the pharmacy by Tuesday to reach out to me or [facility].

## 2024-07-28 ENCOUNTER — Encounter: Payer: Self-pay | Admitting: Cardiology

## 2024-07-28 ENCOUNTER — Ambulatory Visit: Attending: Cardiology | Admitting: Cardiology

## 2024-07-28 ENCOUNTER — Other Ambulatory Visit: Payer: Self-pay

## 2024-07-28 VITALS — BP 140/78 | HR 45 | Ht 70.0 in | Wt 152.6 lb

## 2024-07-28 DIAGNOSIS — E7800 Pure hypercholesterolemia, unspecified: Secondary | ICD-10-CM | POA: Diagnosis not present

## 2024-07-28 DIAGNOSIS — I2510 Atherosclerotic heart disease of native coronary artery without angina pectoris: Secondary | ICD-10-CM | POA: Insufficient documentation

## 2024-07-28 DIAGNOSIS — I10 Essential (primary) hypertension: Secondary | ICD-10-CM | POA: Insufficient documentation

## 2024-07-28 MED ORDER — ROSUVASTATIN CALCIUM 20 MG PO TABS: 20.0000 mg | ORAL_TABLET | Freq: Every day | ORAL | 3 refills | Status: AC

## 2024-07-28 NOTE — Progress Notes (Signed)
 Cardiology Office Note:    Date:  07/28/2024   ID:  Jose, Perry [birth year], MRN [id number]  PCP:  Glendia Shad, MD   [facility] Providers Cardiologist:  Redell Cave, MD     Referring MD: Glendia Shad, MD   Chief Complaint  Patient presents with   Follow-up    6 month follow up pt has been doing well with no complaints of chest pain, chest pressure or SOB, medciation reviewed verbally with patient    History of Present Illness:    Jose Perry is a 83 y.o. male with a hx of Mild nonobstructive CAD (proximal LAD, D1, RCA ) coronary CT 8/23, hypertension, hyperlipidemia, GERD who presents for follow-up.    Feels well, denies chest pain or shortness of breath.  Takes aspirin  as prescribed, Crestor  was stopped by PCP.  Denies any statin intolerances.  Has no cardiac concerns at this time   Prior notes Echo 06/2022 EF 55 to 60% Coronary CT 05/2022 mild nonobstructive CAD in proximal LAD, D1, RCA.  Past Medical History:  Diagnosis Date   Asthma    Dyspnea    Elevated blood pressure    Enlarged prostate    GERD (gastroesophageal reflux disease)    Glaucoma    History of adenomatous polyp of colon    Hx of colonic polyps    Hypertension    Leukopenia    has had an extensive w/up.     Liver hemangioma 01/24/2018   Thyroid  goiter     Past Surgical History:  Procedure Laterality Date   BACK SURGERY  2002   ruptured disc   CATARACT EXTRACTION W/PHACO Right 06/30/2020   Procedure: CATARACT EXTRACTION PHACO AND INTRAOCULAR LENS PLACEMENT (IOC) RIGHT, Malyugin 7.19 01:30.8 7.9%;  Surgeon: Mittie Gaskin, MD;  Location: [facility];  Service: Ophthalmology;  Laterality: Right;   COLONOSCOPY W/ POLYPECTOMY  04/24/2005, 07/06/2010, 07/08/2014   COLONOSCOPY WITH PROPOFOL  N/A 02/01/2018   Procedure: COLONOSCOPY WITH PROPOFOL ;  Surgeon: Viktoria Lamar DASEN, MD;  Location: [facility] ENDOSCOPY;  Service: Endoscopy;  Laterality: N/A;   COLONOSCOPY WITH  PROPOFOL  N/A 10/09/2023   Procedure: COLONOSCOPY WITH PROPOFOL ;  Surgeon: Toledo, Ladell POUR, MD;  Location: [facility] ENDOSCOPY;  Service: Gastroenterology;  Laterality: N/A;   ESOPHAGOGASTRODUODENOSCOPY  04/24/2005, 07/06/2010,   ESOPHAGOGASTRODUODENOSCOPY (EGD) WITH PROPOFOL  N/A 05/10/2022   Procedure: ESOPHAGOGASTRODUODENOSCOPY (EGD) WITH PROPOFOL ;  Surgeon: Toledo, Ladell POUR, MD;  Location: [facility] ENDOSCOPY;  Service: Gastroenterology;  Laterality: N/A;   EYE SURGERY  2009   relieve pressure from glaucoma   POLYPECTOMY  10/09/2023   Procedure: POLYPECTOMY;  Surgeon: Toledo, Ladell POUR, MD;  Location: [facility] ENDOSCOPY;  Service: Gastroenterology;;   THYROID  SURGERY  1968   goiter    Current Medications: Current Meds  Medication Sig   albuterol  (VENTOLIN  HFA) 108 (90 Base) MCG/ACT inhaler INHALE 2 PUFFS INTO THE LUNGS EVERY 4 HOURS AS NEEDED FOR WHEEZING OR SHORTNESS OF BREATH.   amLODipine  (NORVASC ) 5 MG tablet TAKE 1 TABLET BY MOUTH TWICE A DAY   ASPIRIN  81 PO Take by mouth.   benralizumab  (FASENRA  PEN) 30 MG/ML prefilled autoinjector Inject 30mg  in the skin at week 4 on 07/29/24 and week 8 on 08/26/24, then every 8 weeks thereafter.   finasteride  (PROSCAR ) 5 MG tablet Take 1 tablet (5 mg total) by mouth daily.   fluticasone  (FLONASE) 50 MCG/ACT nasal spray Place 2 sprays into both nostrils as needed.   fluticasone  furoate-vilanterol (BREO ELLIPTA ) 100-25 MCG/ACT AEPB Inhale 1 puff  into the lungs daily.   hydrALAZINE  (APRESOLINE ) 10 MG tablet TAKE 1 TABLET BY MOUTH THREE TIMES A DAY   latanoprost (XALATAN) 0.005 % ophthalmic solution ADMINISTER 1 DROP TO THE RIGHT EYE NIGHTLY.   omeprazole  (PRILOSEC) 40 MG capsule TAKE 1 CAPSULE (40 MG TOTAL) BY MOUTH DAILY.   rosuvastatin  (CRESTOR ) 20 MG tablet Take 1 tablet (20 mg total) by mouth daily.   tamsulosin  (FLOMAX ) 0.4 MG CAPS capsule Take 1 capsule (0.4 mg total) by mouth daily.   timolol  (TIMOPTIC ) 0.5 % ophthalmic solution Place 1 drop into the right  eye 2 (two) times daily.   valsartan  (DIOVAN ) 320 MG tablet Take 1 tablet (320 mg total) by mouth every evening.   vitamin B-12 (CYANOCOBALAMIN ) 1000 MCG tablet Take by mouth.     Allergies:   Brimonidine, Brimonidine tartrate, Brinzolamide-brimonidine, and Other   Social History   Socioeconomic History   Marital status: Married    Spouse name: Not on file   Number of children: Not on file   Years of education: Not on file   Highest education level: 12th grade  Occupational History   Not on file  Tobacco Use   Smoking status: Never   Smokeless tobacco: Never  Vaping Use   Vaping status: Never Used  Substance and Sexual Activity   Alcohol use: No    Alcohol/week: 0.0 standard drinks of alcohol   Drug use: No   Sexual activity: Yes  Other Topics Concern   Not on file  Social History Narrative   married   Social Drivers of Corporate investment banker Strain: Low Risk  (06/22/2024)   Overall Financial Resource Strain (CARDIA)    Difficulty of Paying Living Expenses: Not hard at all  Food Insecurity: No Food Insecurity (06/22/2024)   Hunger Vital Sign    Worried About Running Out of Food in the Last Year: Never true    Ran Out of Food in the Last Year: Never true  Transportation Needs: No Transportation Needs (06/22/2024)   PRAPARE - Administrator, Civil Service (Medical): No    Lack of Transportation (Non-Medical): No  Physical Activity: Sufficiently Active (10/30/2023)   Exercise Vital Sign    Days of Exercise per Week: 7 days    Minutes of Exercise per Session: 90 min  Stress: No Stress Concern Present (10/30/2023)   [employer] of Occupational Health - Occupational Stress Questionnaire    Feeling of Stress : Not at all  Social Connections: Unknown (06/22/2024)   Social Connection and Isolation Panel    Frequency of Communication with Friends and Family: Twice a week    Frequency of Social Gatherings with Friends and Family: Never    Attends Religious  Services: Patient declined    Database administrator or Organizations: Patient declined    Attends Engineer, structural: Not on file    Marital Status: Married     Family History: The patient's family history includes Alcohol abuse in his father; Heart disease in his mother; Hyperlipidemia in his father; Stroke in his father.  ROS:   Please see the history of present illness.     All other systems reviewed and are negative.  EKGs/Labs/Other Studies Reviewed:    The following studies were reviewed today:   EKG Interpretation Date/Time:  Monday July 28 2024 14:21:37 EDT Ventricular Rate:  45 PR Interval:  170 QRS Duration:  82 QT Interval:  388 QTC Calculation: 335 R Axis:   -30  Text Interpretation: Sinus bradycardia with sinus arrhythmia Left axis deviation Confirmed by Darliss Rogue [phone]) on 07/28/2024 2:55:36 PM    Recent Labs: 06/23/2024: ALT 16; BUN 16; Creatinine, Ser 1.25; Potassium 4.6; Sodium 139; TSH 2.03 06/26/2024: Hemoglobin 13.4; Platelets 133.0  Recent Lipid Panel    Component Value Date/Time   CHOL 184 06/23/2024 1434   TRIG 31.0 06/23/2024 1434   HDL 72.00 06/23/2024 1434   CHOLHDL 3 06/23/2024 1434   VLDL 6.2 06/23/2024 1434   LDLCALC 106 (H) 06/23/2024 1434     Risk Assessment/Calculations:          Physical Exam:    VS:  BP (!) 140/78 (BP Location: Left Arm, Patient Position: Sitting, Cuff Size: Normal)   Pulse (!) 45   Ht 5' 10 (1.778 m)   Wt 152 lb 9.6 oz (69.2 kg)   SpO2 98%   BMI 21.90 kg/m     Wt Readings from Last 3 Encounters:  07/28/24 152 lb 9.6 oz (69.2 kg)  06/26/24 151 lb 12.8 oz (68.9 kg)  06/23/24 153 lb 3.2 oz (69.5 kg)     GEN:  Well nourished, well developed in no acute distress HEENT: Normal NECK: No JVD; No carotid bruits CARDIAC: RRR, no murmurs, rubs, gallops RESPIRATORY:  Clear to auscultation without rales, wheezing or rhonchi  ABDOMEN: Soft, non-tender, non-distended MUSCULOSKELETAL:   No edema; No deformity  SKIN: Warm and dry NEUROLOGIC:  Alert and oriented x 3 PSYCHIATRIC:  Normal affect   ASSESSMENT:    1. Coronary artery disease involving native heart without angina pectoris, unspecified vessel or lesion type   2. Primary hypertension   3. Pure hypercholesterolemia     PLAN:    In order of problems listed above:  Mild nonobstructive CAD (proximal LAD, D1, RCA ) coronary CT 8/23.  EF 55 to 60%.  Denies chest pain.  Continue aspirin  81 mg daily, restart Crestor  at 20 mg daily. Hyperlipidemia, continue Crestor  20 mg daily as above. Hypertension, BP slightly elevated today, usually controlled.  Continue Norvasc  5 mg in a.m., valsartan  320 mg in the evening..    Follow-up in 12 months      Medication Adjustments/Labs and Tests Ordered: Current medicines are reviewed at length with the patient today.  Concerns regarding medicines are outlined above.  Orders Placed This Encounter  Procedures   EKG 12-Lead   Meds ordered this encounter  Medications   rosuvastatin  (CRESTOR ) 20 MG tablet    Sig: Take 1 tablet (20 mg total) by mouth daily.    Dispense:  90 tablet    Refill:  3    Patient Instructions  Medication Instructions:  Your physician recommends the following medication changes.   START TAKING: Crestor  20 mg    *If you need a refill on your cardiac medications before your next appointment, please call your pharmacy*  Lab Work: No labs ordered today  If you have labs (blood work) drawn today and your tests are completely normal, you will receive your results only by: MyChart Message (if you have MyChart) OR A paper copy in the mail If you have any lab test that is abnormal or we need to change your treatment, we will call you to review the results.  Testing/Procedures: No test ordered today   Follow-Up: At [facility], you and your health needs are our priority.  As part of our continuing mission to provide you with exceptional  heart care, our providers are all part of one team.  This team includes your primary Cardiologist (physician) and Advanced Practice Providers or APPs (Physician Assistants and Nurse Practitioners) who all work together to provide you with the care you need, when you need it.  Your next appointment:   1 year(s)  Provider:   You may see Redell Cave, MD or one of the following Advanced Practice Providers on your designated Care Team:   Lonni Meager, NP Lesley Maffucci, PA-C Bernardino Bring, PA-C Cadence [place], PA-C Tylene Lunch, NP Barnie Hila, NP             Signed, Redell Cave, MD  07/28/2024 3:14 PM    [facility]

## 2024-07-28 NOTE — Patient Instructions (Signed)
 Medication Instructions:  Your physician recommends the following medication changes.   START TAKING: Crestor  20 mg    *If you need a refill on your cardiac medications before your next appointment, please call your pharmacy*  Lab Work: No labs ordered today  If you have labs (blood work) drawn today and your tests are completely normal, you will receive your results only by: MyChart Message (if you have MyChart) OR A paper copy in the mail If you have any lab test that is abnormal or we need to change your treatment, we will call you to review the results.  Testing/Procedures: No test ordered today   Follow-Up: At [facility], you and your health needs are our priority.  As part of our continuing mission to provide you with exceptional heart care, our providers are all part of one team.  This team includes your primary Cardiologist (physician) and Advanced Practice Providers or APPs (Physician Assistants and Nurse Practitioners) who all work together to provide you with the care you need, when you need it.  Your next appointment:   1 year(s)  Provider:   You may see Redell Cave, MD or one of the following Advanced Practice Providers on your designated Care Team:   Lonni Meager, NP Lesley Maffucci, PA-C Bernardino Bring, PA-C Cadence [place], PA-C Tylene Lunch, NP Barnie Hila, NP

## 2024-07-28 NOTE — Progress Notes (Unsigned)
 Verbal Rx for Fasenra  entered.

## 2024-07-28 NOTE — Telephone Encounter (Signed)
 [facility] reports [facility] did not receive Rx. Provided verbal Rx.   Aleck Puls, PharmD, BCPS, CPP Clinical Pharmacist  [facility]

## 2024-07-29 ENCOUNTER — Other Ambulatory Visit: Payer: Self-pay

## 2024-07-29 ENCOUNTER — Encounter: Payer: Self-pay | Admitting: Hematology and Oncology

## 2024-07-30 NOTE — Telephone Encounter (Signed)
 Copied from CRM [id number]. Topic: Clinical - Prescription Issue >> Jul 30, 2024 11:11 AM Rozanna MATSU wrote: Reason for CRM: Reena with [facility] [phone] in regards to the patients benralizumab  (FASENRA  PEN) 30 MG/ML prefilled autoinjector. Stated they need to know if patient has had week zero (0) for the Fasnera, stated the prescription they is is for week 4 and week 8. They are needing clarity on the prescription. Stated they faxed a form for completion about this on 10/20 and stated it can be retrieved and faxed back with signature date and title. The fax number is [phone].

## 2024-07-30 NOTE — Telephone Encounter (Signed)
 Reached out to [facility] [phone] and confirmed with the pharmacist that the pt received his first dose on 9/23. She stated she will escalate the case to try to ship the medication out today or overnight so he will receive it tomorrow. She updated the instructions on the rx and removed the dates since the pt will be a day or 2 behind. New instructions will say to inject on Week 4, Week 8, and every 8 weeks thereafter. She stated she will have a pharmacist reach out to go over the correct injection dates with the pt.

## 2024-07-31 ENCOUNTER — Other Ambulatory Visit: Payer: Self-pay | Admitting: Internal Medicine

## 2024-07-31 ENCOUNTER — Other Ambulatory Visit: Payer: Self-pay | Admitting: Urology

## 2024-08-01 NOTE — Telephone Encounter (Signed)
 Received another call from the pt stating he got a text from [facility] a couple days ago saying our office refused the prescription. [facility] and they stated that all issues were resolved and they just needed to get in touch with the patient for scheduling. They were calling the wrong phone number to reach the pt, I provided them with the correct phone number. They called the pt with me still on the line and we were able to confirm his order has been placed and he should receive it tomorrow. Pt will inject on 10/25, next dose will be on 11/22 and the dose after that will be on 1/17. I advised him to [facility] out to [facility] a week before he is due for his next dose and to call me if he hasn't received it by Monday.

## 2024-08-03 ENCOUNTER — Other Ambulatory Visit: Payer: Self-pay | Admitting: Urology

## 2024-08-03 DIAGNOSIS — N138 Other obstructive and reflux uropathy: Secondary | ICD-10-CM

## 2024-08-06 ENCOUNTER — Ambulatory Visit: Payer: Self-pay | Admitting: Urology

## 2024-08-06 VITALS — BP 142/74 | HR 93 | Ht 70.0 in | Wt 150.0 lb

## 2024-08-06 DIAGNOSIS — N401 Enlarged prostate with lower urinary tract symptoms: Secondary | ICD-10-CM

## 2024-08-06 DIAGNOSIS — N138 Other obstructive and reflux uropathy: Secondary | ICD-10-CM | POA: Diagnosis not present

## 2024-08-06 LAB — BLADDER SCAN AMB NON-IMAGING: Scan Result: 143

## 2024-08-06 NOTE — Progress Notes (Unsigned)
 08/06/2024 11:49 AM   Jose Perry 1941/06/18 [id number]  Referring provider: Glendia Shad, MD [address] [place],  [place] [address]  Chief Complaint  Patient presents with   Benign Prostatic Hypertrophy   Urologic history: 1.  BPH with lower urinary tract symptoms Tamsulosin  daily Finasteride  added October 2023 due to increasing PVR 200 mL.  HPI: Jose Perry is a 83 y.o. male who dents for annual follow-up  Doing well since last visit No bothersome LUTS Remains on tamsulosin  and finasteride  Denies dysuria, gross hematuria Denies flank, abdominal or pelvic pain  PMH: Past Medical History:  Diagnosis Date   Asthma    Dyspnea    Elevated blood pressure    Enlarged prostate    GERD (gastroesophageal reflux disease)    Glaucoma    History of adenomatous polyp of colon    Hx of colonic polyps    Hypertension    Leukopenia    has had an extensive w/up.     Liver hemangioma 01/24/2018   Thyroid  goiter     Surgical History: Past Surgical History:  Procedure Laterality Date   BACK SURGERY  2002   ruptured disc   CATARACT EXTRACTION W/PHACO Right 06/30/2020   Procedure: CATARACT EXTRACTION PHACO AND INTRAOCULAR LENS PLACEMENT (IOC) RIGHT, Malyugin 7.19 01:30.8 7.9%;  Surgeon: Mittie Gaskin, MD;  Location: [facility];  Service: Ophthalmology;  Laterality: Right;   COLONOSCOPY W/ POLYPECTOMY  04/24/2005, 07/06/2010, 07/08/2014   COLONOSCOPY WITH PROPOFOL  N/A 02/01/2018   Procedure: COLONOSCOPY WITH PROPOFOL ;  Surgeon: Viktoria Jose DASEN, MD;  Location: [facility] ENDOSCOPY;  Service: Endoscopy;  Laterality: N/A;   COLONOSCOPY WITH PROPOFOL  N/A 10/09/2023   Procedure: COLONOSCOPY WITH PROPOFOL ;  Surgeon: Toledo, Ladell POUR, MD;  Location: [facility] ENDOSCOPY;  Service: Gastroenterology;  Laterality: N/A;   ESOPHAGOGASTRODUODENOSCOPY  04/24/2005, 07/06/2010,   ESOPHAGOGASTRODUODENOSCOPY (EGD) WITH PROPOFOL  N/A 05/10/2022   Procedure:  ESOPHAGOGASTRODUODENOSCOPY (EGD) WITH PROPOFOL ;  Surgeon: Toledo, Ladell POUR, MD;  Location: [facility] ENDOSCOPY;  Service: Gastroenterology;  Laterality: N/A;   EYE SURGERY  2009   relieve pressure from glaucoma   POLYPECTOMY  10/09/2023   Procedure: POLYPECTOMY;  Surgeon: Toledo, Teodoro K, MD;  Location: [facility] ENDOSCOPY;  Service: Gastroenterology;;   THYROID  SURGERY  1968   goiter    Home Medications:  Allergies as of 08/06/2024       Reactions   Brimonidine    Other reaction(s): Eye Redness   Brimonidine Tartrate    Other reaction(s): Eye redness   Brinzolamide-brimonidine    Other reaction(s): Eye redness   Other Other (See Comments)   Simbrinza Eye Drops gives pt Eye redness        Medication List        Accurate as of August 06, 2024 11:49 AM. If you have any questions, ask your nurse or doctor.          albuterol  108 (90 Base) MCG/ACT inhaler Commonly known as: VENTOLIN  HFA INHALE 2 PUFFS INTO THE LUNGS EVERY 4 HOURS AS NEEDED FOR WHEEZING OR SHORTNESS OF BREATH.   amLODipine  5 MG tablet Commonly known as: NORVASC  TAKE 1 TABLET BY MOUTH TWICE A DAY   ASPIRIN  81 PO Take by mouth.   benralizumab  30 MG/ML prefilled autoinjector Commonly known as: FASENRA  PEN Inject 30mg  in the skin at week 4 on 07/29/24 and week 8 on 08/26/24, then every 8 weeks thereafter.   cyanocobalamin  1000 MCG tablet Commonly known as: VITAMIN B12 Take by mouth.   finasteride   5 MG tablet Commonly known as: PROSCAR  TAKE 1 TABLET (5 MG TOTAL) BY MOUTH DAILY.   fluticasone  50 MCG/ACT nasal spray Commonly known as: FLONASE Place 2 sprays into both nostrils as needed.   fluticasone  furoate-vilanterol 100-25 MCG/ACT Aepb Commonly known as: Breo Ellipta  Inhale 1 puff into the lungs daily.   hydrALAZINE  10 MG tablet Commonly known as: APRESOLINE  TAKE 1 TABLET BY MOUTH THREE TIMES A DAY   latanoprost 0.005 % ophthalmic solution Commonly known as: XALATAN ADMINISTER 1 DROP TO THE  RIGHT EYE NIGHTLY.   omeprazole  40 MG capsule Commonly known as: PRILOSEC TAKE 1 CAPSULE (40 MG TOTAL) BY MOUTH DAILY.   rosuvastatin  20 MG tablet Commonly known as: CRESTOR  Take 1 tablet (20 mg total) by mouth daily.   tamsulosin  0.4 MG Caps capsule Commonly known as: FLOMAX  TAKE 1 CAPSULE BY MOUTH EVERY DAY   timolol  0.5 % ophthalmic solution Commonly known as: TIMOPTIC  Place 1 drop into the right eye 2 (two) times daily.   valsartan  320 MG tablet Commonly known as: DIOVAN  Take 1 tablet (320 mg total) by mouth every evening.        Allergies:  Allergies  Allergen Reactions   Brimonidine     Other reaction(s): Eye Redness   Brimonidine Tartrate     Other reaction(s): Eye redness   Brinzolamide-Brimonidine     Other reaction(s): Eye redness   Other Other (See Comments)    Simbrinza Eye Drops gives pt Eye redness    Family History: Family History  Problem Relation Age of Onset   Heart disease Mother    Alcohol abuse Father    Hyperlipidemia Father    Stroke Father     Social History:  reports that he has never smoked. He has never used smokeless tobacco. He reports that he does not drink alcohol and does not use drugs.   Physical Exam: BP (!) 142/74   Pulse 93   Ht 5' 10 (1.778 m)   Wt 150 lb (68 kg)   BMI 21.52 kg/m   Constitutional:  Alert, No acute distress. HEENT: [place] AT Respiratory: Normal respiratory effort, no increased work of breathing. Psychiatric: Normal mood and affect.   Assessment & Plan:    1. BPH with lower urinary tract symptoms Stable on tamsulosin /finasteride  PVR today 142 mL Continue annual follow-up with PVR; call earlier for worsening voiding symptoms   Glendia JAYSON Barba, MD  [facility] [address] [place], [place] [address] [phone]

## 2024-08-07 ENCOUNTER — Encounter: Payer: Self-pay | Admitting: Urology

## 2024-08-11 ENCOUNTER — Ambulatory Visit (INDEPENDENT_AMBULATORY_CARE_PROVIDER_SITE_OTHER): Admitting: Nurse Practitioner

## 2024-08-11 ENCOUNTER — Ambulatory Visit: Payer: Self-pay

## 2024-08-11 ENCOUNTER — Encounter: Payer: Self-pay | Admitting: Nurse Practitioner

## 2024-08-11 VITALS — BP 146/96 | HR 60 | Temp 97.6°F | Ht 70.0 in | Wt 155.0 lb

## 2024-08-11 DIAGNOSIS — J4551 Severe persistent asthma with (acute) exacerbation: Secondary | ICD-10-CM

## 2024-08-11 DIAGNOSIS — J3089 Other allergic rhinitis: Secondary | ICD-10-CM

## 2024-08-11 DIAGNOSIS — J45901 Unspecified asthma with (acute) exacerbation: Secondary | ICD-10-CM | POA: Diagnosis not present

## 2024-08-11 DIAGNOSIS — R0602 Shortness of breath: Secondary | ICD-10-CM

## 2024-08-11 LAB — NITRIC OXIDE: Nitric Oxide: 36

## 2024-08-11 LAB — POC COVID19 BINAXNOW: SARS Coronavirus 2 Ag: NEGATIVE

## 2024-08-11 MED ORDER — PREDNISONE 10 MG PO TABS: ORAL_TABLET | ORAL | 0 refills | Status: DC

## 2024-08-11 MED ORDER — BENZONATATE 200 MG PO CAPS: 200.0000 mg | ORAL_CAPSULE | Freq: Three times a day (TID) | ORAL | 1 refills | Status: DC | PRN

## 2024-08-11 NOTE — Progress Notes (Signed)
 @Patient  ID: Jose Perry, male    DOB: 1941/07/26, 83 y.o.   MRN: [id number]  Chief Complaint  Patient presents with   Cough    Cough and shortness of breath, started yesterday.     Referring provider: Glendia Shad, MD  HPI: 83 year old male, never smoker followed for severe asthma.  He is patient Dr. Tamea and last seen in office 06/23/2024.  Past medical history significant for environmental allergies, hypertension, liver hemangioma, GERD, thyroid  goiter, BPH, open-angle glaucoma, ED.  TEST/EVENTS:  09/12/2017 CT chest without contrast: Enlarged left thyroid  gland.  Lungs are clear.  CAD. 05/2022 cardiac CT: Included lung fields are clear.  Small pericardial effusion.  Benign liver hemangioma 07/2022 FeNo 57 ppb 08/2022 positive RAST to grasses, dust, dust mites, trees, cat dander, mold; IgE 325; eos 300 08/2022: A1AT 124, MM 09/12/2022 PFT: FVC 47, FEV1 44, ratio 65, TLC 74, DLCOcor 71  06/23/2024: OV with Dr. Tamea. Severe asthma with persistently high airway inflammation. Current tx with Breo. Previous attempt to increase Breo caused difficulties. Biologic therapy was initially declined but reconsidered due to risks of airway remodeling and persistent symptoms. Will start Fasenra . Advised to follow up with allergy for allergy shots.   08/11/2024: Today - acute Discussed the use of AI scribe software for clinical note transcription with the patient, who gave verbal consent to proceed.  History of Present Illness Jose Perry is an 83 year old male with asthma who presents with increased shortness of breath and dry cough.  He has experienced increased shortness of breath and a dry cough since yesterday. Wheezing or chest tightness are present. He uses his rescue inhaler approximately twice a day and feels he could use it more frequently.  He denies sinus congestion or drainage but notes some mucus in his throat. No fevers or chills are reported. He continues to use his  Breo inhaler. Not using Flonase nasal spray. He is also continuing with his Fasenra  injections. No hemoptysis, chest pain, low oxygen  levels.  No recent illness in others around him and no new symptoms aside from those mentioned.  Lab Results  Component Value Date   NITRICOXIDE 36 08/11/2024   This result suggests intermediate (25-49) Type 2 (T2) airway inflammation; clinical correlation required.     Allergies  Allergen Reactions   Brimonidine     Other reaction(s): Eye Redness   Brimonidine Tartrate     Other reaction(s): Eye redness   Brinzolamide-Brimonidine     Other reaction(s): Eye redness   Other Other (See Comments)    Simbrinza Eye Drops gives pt Eye redness    Immunization History  Administered Date(s) Administered   Fluad Quad(high Dose 65+) 08/27/2020, 07/11/2021, 06/16/2022   INFLUENZA, HIGH DOSE SEASONAL PF 06/13/2016, 08/20/2017, 07/31/2018, 08/22/2019, 06/15/2023, 07/17/2024   Influenza,inj,Quad PF,6+ Mos 07/20/2015   PFIZER Comirnaty(Gray Top)Covid-19 Tri-Sucrose Vaccine 02/26/2021, 06/28/2022   PFIZER(Purple Top)SARS-COV-2 Vaccination 10/30/2019, 11/20/2019, 07/05/2020   Pfizer Covid-19 Vaccine Bivalent Booster 13yrs & up 07/01/2021   Pneumococcal Conjugate-13 06/10/2015   Pneumococcal Polysaccharide-23 06/30/2013   Respiratory Syncytial Virus Vaccine,Recomb Aduvanted(Arexvy) 10/07/2022   Tdap 10/25/2021   Zoster Recombinant(Shingrix) 07/06/2018, 08/15/2021   Zoster, Live 06/30/2013    Past Medical History:  Diagnosis Date   Asthma    Dyspnea    Elevated blood pressure    Enlarged prostate    GERD (gastroesophageal reflux disease)    Glaucoma    History of adenomatous polyp of colon    Hx of colonic polyps  Hypertension    Leukopenia    has had an extensive w/up.     Liver hemangioma 01/24/2018   Thyroid  goiter     Tobacco History: Social History   Tobacco Use  Smoking Status Never  Smokeless Tobacco Never   Counseling given: Not  Answered   Outpatient Medications Prior to Visit  Medication Sig Dispense Refill   albuterol  (VENTOLIN  HFA) 108 (90 Base) MCG/ACT inhaler INHALE 2 PUFFS INTO THE LUNGS EVERY 4 HOURS AS NEEDED FOR WHEEZING OR SHORTNESS OF BREATH. 8 g 11   amLODipine  (NORVASC ) 5 MG tablet TAKE 1 TABLET BY MOUTH TWICE A DAY 180 tablet 1   ASPIRIN  81 PO Take by mouth.     benralizumab  (FASENRA  PEN) 30 MG/ML prefilled autoinjector Inject 30mg  in the skin at week 4 on 07/29/24 and week 8 on 08/26/24, then every 8 weeks thereafter. 3 mL 0   finasteride  (PROSCAR ) 5 MG tablet TAKE 1 TABLET (5 MG TOTAL) BY MOUTH DAILY. 90 tablet 3   fluticasone  (FLONASE) 50 MCG/ACT nasal spray Place 2 sprays into both nostrils as needed.     fluticasone  furoate-vilanterol (BREO ELLIPTA ) 100-25 MCG/ACT AEPB Inhale 1 puff into the lungs daily. 60 each 6   hydrALAZINE  (APRESOLINE ) 10 MG tablet TAKE 1 TABLET BY MOUTH THREE TIMES A DAY 270 tablet 1   latanoprost (XALATAN) 0.005 % ophthalmic solution ADMINISTER 1 DROP TO THE RIGHT EYE NIGHTLY.     omeprazole  (PRILOSEC) 40 MG capsule TAKE 1 CAPSULE (40 MG TOTAL) BY MOUTH DAILY. 90 capsule 1   rosuvastatin  (CRESTOR ) 20 MG tablet Take 1 tablet (20 mg total) by mouth daily. 90 tablet 3   tamsulosin  (FLOMAX ) 0.4 MG CAPS capsule TAKE 1 CAPSULE BY MOUTH EVERY DAY 90 capsule 3   timolol  (TIMOPTIC ) 0.5 % ophthalmic solution Place 1 drop into the right eye 2 (two) times daily.     valsartan  (DIOVAN ) 320 MG tablet Take 1 tablet (320 mg total) by mouth every evening. 90 tablet 3   vitamin B-12 (CYANOCOBALAMIN ) 1000 MCG tablet Take by mouth.     No facility-administered medications prior to visit.     Review of Systems: as above    Physical Exam:  BP (!) 146/96   Pulse 60   Temp 97.6 F (36.4 C) (Temporal)   Ht 5' 10 (1.778 m)   Wt 155 lb (70.3 kg)   SpO2 99%   BMI 22.24 kg/m   GEN: Pleasant, interactive, well-appearing; in no acute distress HEENT:  Normocephalic and atraumatic.  PERRLA. Sclera white. Nasal turbinates erythematous, moist and patent bilaterally. Clear rhinorrhea present. Oropharynx pink and moist, without exudate or edema. No lesions, ulcerations, or postnasal drip.  NECK:  Supple w/ fair ROM. No JVD present. Cervical lymphadenopathy.   CV: RRR, no m/r/g, no peripheral edema. Pulses intact, +2 bilaterally. No cyanosis, pallor or clubbing. PULMONARY:  Unlabored, regular breathing. Minimal end expiratory wheezes bilaterally A&P. Bronchitic cough. No accessory muscle use.  GI: BS present and normoactive. Soft, non-tender to palpation.  MSK: No erythema, warmth or tenderness. Cap refil <2 sec all extrem.  Neuro: A/Ox3. No focal deficits noted.   Skin: Warm, no lesions or rashe Psych: Normal affect and behavior. Judgement and thought content appropriate.     Lab Results:  CBC    Component Value Date/Time   WBC 2.9 (L) 06/26/2024 1052   RBC 4.85 06/26/2024 1052   HGB 13.4 06/26/2024 1052   HGB 13.0 08/25/2022 1428   HCT 41.9 06/26/2024 1052  HCT 39.8 08/25/2022 1428   PLT 133.0 (L) 06/26/2024 1052   PLT 152 08/25/2022 1428   MCV 86.4 06/26/2024 1052   MCV 85 08/25/2022 1428   MCV 84 04/16/2014 1927   MCH 27.6 11/30/2023 1034   MCHC 32.0 06/26/2024 1052   RDW 14.5 06/26/2024 1052   RDW 13.5 08/25/2022 1428   RDW 15.1 (H) 04/16/2014 1927   LYMPHSABS 0.9 06/26/2024 1052   LYMPHSABS 0.8 08/25/2022 1428   LYMPHSABS 0.6 (L) 03/22/2014 1827   MONOABS 0.3 06/26/2024 1052   MONOABS 0.6 03/22/2014 1827   EOSABS 0.2 06/26/2024 1052   EOSABS 0.3 08/25/2022 1428   EOSABS 0.1 03/22/2014 1827   BASOSABS 0.0 06/26/2024 1052   BASOSABS 0.0 08/25/2022 1428   BASOSABS 0.0 03/22/2014 1827    BMET    Component Value Date/Time   NA 139 06/23/2024 1434   NA 130 (L) 04/16/2014 1927   K 4.6 06/23/2024 1434   K 3.8 04/16/2014 1927   CL 103 06/23/2024 1434   CL 95 (L) 04/16/2014 1927   CO2 29 06/23/2024 1434   CO2 26 04/16/2014 1927   GLUCOSE 84  06/23/2024 1434   GLUCOSE 117 (H) 04/16/2014 1927   BUN 16 06/23/2024 1434   BUN 7 (L) 08/22/2018 1440   BUN 16 04/16/2014 1927   CREATININE 1.25 06/23/2024 1434   CREATININE 1.60 (H) 04/16/2014 1927   CALCIUM  10.0 06/23/2024 1434   CALCIUM  9.8 04/16/2014 1927   GFRNONAA >60 11/30/2023 1034   GFRNONAA 42 (L) 04/16/2014 1927   GFRAA 68 08/22/2018 1440   GFRAA 49 (L) 04/16/2014 1927    BNP    Component Value Date/Time   BNP 60.0 08/21/2017 0136     Imaging:  No results found.  Administration History     None          Latest Ref Rng & Units 09/12/2022    2:54 PM  PFT Results  FVC-Pre L 1.93   FVC-Predicted Pre % 47   FVC-Post L 1.95   FVC-Predicted Post % 48   Pre FEV1/FVC % % 66   Post FEV1/FCV % % 65   FEV1-Pre L 1.28   FEV1-Predicted Pre % 44   FEV1-Post L 1.28   DLCO uncorrected ml/min/mmHg 20.15   DLCO UNC% % 82   DLVA Predicted % 115   TLC L 5.27   TLC % Predicted % 74   RV % Predicted % 119     Lab Results  Component Value Date   NITRICOXIDE 36 08/11/2024        Assessment & Plan:   Severe asthma with acute exacerbation Severe asthmatic on biologic therapy. Mild acute exacerbation with elevated exhaled nitric oxide  testing, consistent with type II inflammation, likely related to allergies or viral URI. Will treat him with prednisone  taper and cough control regimen. COVID testing negative in office today. Continue aggressive maintenance regimen. Optimize bronchodilator support with nebulizer therapies. Action plan in place. Strict return/ED precautions. Close follow up.  Patient Instructions  Continue Breo 1 puff daily. Brush tongue and rinse mouth afterwards Continue Albuterol  inhaler 2 puffs or 3 mL neb every 6 hours as needed for shortness of breath or wheezing. Notify if symptoms persist despite rescue inhaler/neb use.  Restart flonase nasal spray   Prednisone  taper. 4 tabs for 2 days, then 3 tabs for 2 days, 2 tabs for 2 days, then 1 tab  for 2 days, then stop. Take in AM with food  Benzonatate 1  capsule Three times a day as needed for coughing  Delsym or similar cough syrup over the counter as needed for coughing   COVID test was negative    Follow up in 4 weeks with Dr. Joelle. If symptoms do not improve or worsen, please contact office for sooner follow up or seek emergency care.    Allergic rhinitis Advised to resume intranasal steroid and continue allergy regimen with allergist.      I spent 35 minutes of dedicated to the care of this patient on the date of this encounter to include pre-visit review of records, face-to-face time with the patient discussing conditions above, post visit ordering of testing, clinical documentation with the electronic health record, making appropriate referrals as documented, and communicating necessary findings to members of the patients care team.  Comer LULLA Rouleau, NP 08/11/2024  Pt aware and understands NP's role.

## 2024-08-11 NOTE — Patient Instructions (Signed)
 Continue Breo 1 puff daily. Brush tongue and rinse mouth afterwards Continue Albuterol  inhaler 2 puffs or 3 mL neb every 6 hours as needed for shortness of breath or wheezing. Notify if symptoms persist despite rescue inhaler/neb use.  Restart flonase nasal spray   Prednisone  taper. 4 tabs for 2 days, then 3 tabs for 2 days, 2 tabs for 2 days, then 1 tab for 2 days, then stop. Take in AM with food  Benzonatate 1 capsule Three times a day as needed for coughing  Delsym or similar cough syrup over the counter as needed for coughing   COVID test was negative    Follow up in 4 weeks with Dr. Joelle. If symptoms do not improve or worsen, please contact office for sooner follow up or seek emergency care.

## 2024-08-11 NOTE — Assessment & Plan Note (Signed)
 Advised to resume intranasal steroid and continue allergy regimen with allergist.

## 2024-08-11 NOTE — Assessment & Plan Note (Addendum)
 Severe asthmatic on biologic therapy. Mild acute exacerbation with elevated exhaled nitric oxide  testing, consistent with type II inflammation, likely related to allergies or viral URI. Will treat him with prednisone  taper and cough control regimen. COVID testing negative in office today. Continue aggressive maintenance regimen. Optimize bronchodilator support with nebulizer therapies. Action plan in place. Strict return/ED precautions. Close follow up.  Patient Instructions  Continue Breo 1 puff daily. Brush tongue and rinse mouth afterwards Continue Albuterol  inhaler 2 puffs or 3 mL neb every 6 hours as needed for shortness of breath or wheezing. Notify if symptoms persist despite rescue inhaler/neb use.  Restart flonase nasal spray   Prednisone  taper. 4 tabs for 2 days, then 3 tabs for 2 days, 2 tabs for 2 days, then 1 tab for 2 days, then stop. Take in AM with food  Benzonatate 1 capsule Three times a day as needed for coughing  Delsym or similar cough syrup over the counter as needed for coughing   COVID test was negative    Follow up in 4 weeks with Dr. Joelle. If symptoms do not improve or worsen, please contact office for sooner follow up or seek emergency care.

## 2024-08-11 NOTE — Telephone Encounter (Signed)
 Noted, NFN

## 2024-08-11 NOTE — Telephone Encounter (Signed)
 FYI Only or Action Required?: Action required by provider: request for appointment.  Patient was last seen in primary care on 06/26/2024 by Glendia Shad, MD.  Called Nurse Triage reporting Cough.  Symptoms began yesterday.  Interventions attempted: Rest, hydration, or home remedies.  Symptoms are: gradually worsening. Pt. Requests pulmonary appointment.   Triage Disposition: See HCP Within 4 Hours (Or PCP Triage)  Patient/caregiver understands and will follow disposition?: Yes     Copied from CRM #[id number]. Topic: Clinical - Red Word Triage >> Aug 11, 2024  8:23 AM Benton KIDD wrote: [facility] that prompted transfer to Nurse Triage: coughing and tightness in chest  Dr tamea Monroe Reason for Disposition  [1] MILD difficulty breathing (e.g., minimal/no SOB at rest, SOB with walking, pulse < 100) AND [2] still present when not coughing  Answer Assessment - Initial Assessment Questions 1. ONSET: When did the cough begin?      yesterday 2. SEVERITY: How bad is the cough today?      moderate 3. SPUTUM: Describe the color of your sputum (e.g., none, dry cough; clear, white, yellow, green)     no 4. HEMOPTYSIS: Are you coughing up any blood? If Yes, ask: How much? (e.g., flecks, streaks, tablespoons, etc.)     no 5. DIFFICULTY BREATHING: Are you having difficulty breathing? If Yes, ask: How bad is it? (e.g., mild, moderate, severe)      mild 6. FEVER: Do you have a fever? If Yes, ask: What is your temperature, how was it measured, and when did it start?     no 7. CARDIAC HISTORY: Do you have any history of heart disease? (e.g., heart attack, congestive heart failure)      no 8. LUNG HISTORY: Do you have any history of lung disease?  (e.g., pulmonary embolus, asthma, emphysema)     asthma 9. PE RISK FACTORS: Do you have a history of blood clots? (or: recent major surgery, recent prolonged travel, bedridden)     no 10. OTHER SYMPTOMS: Do you have any  other symptoms? (e.g., runny nose, wheezing, chest pain)       Chest tightness 11. PREGNANCY: Is there any chance you are pregnant? When was your last menstrual period?       N/a 12. TRAVEL: Have you traveled out of the country in the last month? (e.g., travel history, exposures)       no  Protocols used: Cough - Acute Non-Productive-A-AH

## 2024-08-12 ENCOUNTER — Other Ambulatory Visit: Payer: Self-pay | Admitting: Pulmonary Disease

## 2024-08-15 NOTE — Telephone Encounter (Signed)
 Received refill request for loading dose of Fasenra  from [facility]. Called pharmacy to ensure they have Rx for maintenance dose to avoid confusion. [facility] representative advised the refill request was an error and the maintenance Rx is on file. NFN

## 2024-08-20 ENCOUNTER — Other Ambulatory Visit: Payer: Self-pay

## 2024-08-20 ENCOUNTER — Ambulatory Visit: Payer: Self-pay

## 2024-08-20 NOTE — Telephone Encounter (Signed)
 FYI Only or Action Required?: Action required by provider: update on patient condition.  Patient is followed in Pulmonology for SOB, last seen on 08/11/2024 by Malachy Comer GAILS, NP.  Called Nurse Triage reporting Shortness of Breath.  Symptoms began today.  Triage Disposition: Go to ED Now (Notify PCP)  Patient/caregiver understands and will follow disposition?: No, wishes to speak with PCP           Copied from CRM #[id number]. Topic: Clinical - Red Word Triage >> Aug 20, 2024 10:04 AM Ismael A wrote: Red Word that prompted transfer to Nurse Triage: having trouble breathing, coughing, chest tightness Reason for Disposition  [1] MODERATE difficulty breathing (e.g., speaks in phrases, SOB even at rest, pulse 100-120) AND [2] NEW-onset or WORSE than normal  Answer Assessment - Initial Assessment Questions This RN recommends pt goes to ED but pt refused. This RN notified [facility] of pt refusal of ED disposition.  Contact Information [phone]   Difficulty breathing (moderate per pt), coughing, chest tightness (middle to right in location, 5/10) Onset: this morning Constant  Protocols used: Breathing Difficulty-A-AH

## 2024-08-20 NOTE — Telephone Encounter (Signed)
 He needs to be seen in the emergency room.  He had a recent visit here and was instructed that if his symptoms persisted or worsen he needed ED visit.  He may need imaging to exclude pneumonia or other issues.

## 2024-08-20 NOTE — Telephone Encounter (Signed)
 Spoke with pt to relay recommendation per Dr. Tamea and he said he will think about it further and if he feels he needs it, he will go to the ER.  NFN.

## 2024-08-22 DIAGNOSIS — H6123 Impacted cerumen, bilateral: Secondary | ICD-10-CM | POA: Diagnosis not present

## 2024-08-22 DIAGNOSIS — H938X9 Other specified disorders of ear, unspecified ear: Secondary | ICD-10-CM | POA: Diagnosis not present

## 2024-08-27 ENCOUNTER — Telehealth: Payer: Self-pay

## 2024-08-27 NOTE — Telephone Encounter (Signed)
 error

## 2024-08-27 NOTE — Telephone Encounter (Signed)
 Submitted a Prior Authorization RENEWAL request to [facility] for FASENRA  via CoverMyMeds. Will update once we receive a response.  Key: [id number]

## 2024-08-29 NOTE — Telephone Encounter (Signed)
 Received notification from [facility] FEP regarding a prior authorization for FASENRA . Authorization has been APPROVED from 07/28/2024 to 02/23/25. Approval letter sent to scan center.  Phone # [phone]

## 2024-09-02 ENCOUNTER — Other Ambulatory Visit: Payer: Self-pay | Admitting: Internal Medicine

## 2024-09-09 ENCOUNTER — Ambulatory Visit: Admitting: Pulmonary Disease

## 2024-09-09 ENCOUNTER — Encounter: Payer: Self-pay | Admitting: Pulmonary Disease

## 2024-09-09 VITALS — BP 106/76 | HR 65 | Temp 97.9°F | Ht 70.0 in | Wt 153.4 lb

## 2024-09-09 DIAGNOSIS — J3089 Other allergic rhinitis: Secondary | ICD-10-CM

## 2024-09-09 DIAGNOSIS — J4551 Severe persistent asthma with (acute) exacerbation: Secondary | ICD-10-CM

## 2024-09-09 DIAGNOSIS — J8283 Eosinophilic asthma: Secondary | ICD-10-CM | POA: Diagnosis not present

## 2024-09-09 LAB — NITRIC OXIDE: Nitric Oxide: 44

## 2024-09-09 MED ORDER — ALBUTEROL SULFATE (2.5 MG/3ML) 0.083% IN NEBU: 2.5000 mg | INHALATION_SOLUTION | Freq: Four times a day (QID) | RESPIRATORY_TRACT | 2 refills | Status: AC | PRN

## 2024-09-09 MED ORDER — PREDNISONE 20 MG PO TABS: 20.0000 mg | ORAL_TABLET | Freq: Every day | ORAL | 0 refills | Status: AC

## 2024-09-09 NOTE — Progress Notes (Unsigned)
 Subjective:    Patient ID: Jose Perry, male    DOB: [birth year], 83 y.o.   MRN: [id number]  Patient Care Team: Glendia Shad, MD as PCP - General (Internal Medicine) Darliss Rogue, MD as PCP - Cardiology (Cardiology) Tamea Dedra CROME, MD as Consulting Physician (Pulmonary Disease)  Chief Complaint  Patient presents with   Asthma    Cough and chest congestion.  Shortness of breath on exertion.     BACKGROUND/INTERVAL::Patient is an 83 year old lifelong never smoker with medical history as noted below, who presents for follow-up of shortness of breath.  Patient was last seen on 23 June 2024.  He has asthma with eosinophilic and allergic phenotype.  He has been on Fasenra .  This is a scheduled follow-up visit.   HPI Discussed the use of AI scribe software for clinical note transcription with the patient, who gave verbal consent to proceed.  History of Present Illness   Jose Perry is an 83 year old male with eosinophilic asthma who presents for management of his condition.  The patient reports that the weather has been hard on him. He is currently on Fasenra  and Breo 100 for asthma management. He previously tried a higher dose of Breo but did not tolerate it well, and he is doing better on his current regimen.  Recently, he experienced a day where he felt he might have an asthma attack and considered going to the emergency room but managed to avoid it. During this episode, he used his emergency inhaler, which provided relief. He does not currently have a nebulizer machine at home but wants one for emergency situations to avoid hospital visits.     DATA/EVENTS 07/11/2018 PFTs: FEV1 0.77 L or 31% predicted, FVC 1.52 L or 47% predicted, FEV1/FVC 51%, there was a 40% net change on FEV1 postbronchodilator.  There was air trapping but no hyperinflation noted on lung volumes.  Consistent with severe obstructive airways disease with significant bronchodilator  response 05/18/2022 coronary CTA: Lung windows nonrevealing. 05/25/2022 chest x-ray PA and lateral: Hyperinflation otherwise no abnormalities. Review of Systems A 10 point review of systems was performed and it is as noted above otherwise negative. 08/03/2022 nitric oxide : 57 ppb 08/25/2022 alpha 1: Phenotype MM, level 124 08/25/2022 IgE,Allergen panel/eosinophils: IgE 325, multiple allergens identified, no eosinophilia 09/12/2022 PFTs: FEV1 1.28 L or 44% predicted, FVC 1.93 L or 47% predicted, FEV1/FVC 66%, no significant bronchodilator response.  Pseudo restriction may be on the basis of air trapping. 06/06/2023 CBC: 900 eosinophils noted. 06/15/2023 nitirc oxide:39 ppb 07/01/2024 Fasenra  initiated 07/30/2023 nitric oxide : 25 ppb 09/13/2023 nitric oxide : 30 ppb 01/18/2024 nitric oxide : 40 ppb  Review of Systems A 10 point review of systems was performed and it is as noted above otherwise negative.   Patient Active Problem List   Diagnosis Date Noted   Elevated CK 06/26/2024   Muscle weakness 06/26/2024   Muscle ache 02/19/2024   Epistaxis 09/17/2023   Swelling of lower extremity 04/13/2023   Allergic rhinitis 02/13/2023   Otitis externa 02/13/2023   Diarrhea 10/30/2021   Iron deficiency 05/05/2020   Hyperplasia of prostate with lower urinary tract symptoms (LUTS) 07/10/2018   Exotropia 07/10/2018   Blepharoconjunctivitis 07/10/2018   Primary open angle glaucoma (POAG) 06/28/2018   B12 deficiency 05/14/2018   Liver hemangioma 01/24/2018   Thrombocytopenia 12/19/2017   Change in stool 10/12/2017   Severe asthma with acute exacerbation 08/30/2017   Urinary urgency 05/23/2017   Thyroid  nodule 06/13/2016   Primary open angle  glaucoma (POAG) of right eye, moderate stage 04/10/2016   Shortness of breath 08/30/2015   Bruit 06/14/2015   Health care maintenance 12/13/2014   Glaucoma 09/21/2014   Goiter 06/28/2014   Hypertension 06/28/2014   Leukopenia 06/28/2014   History of  colonic polyps 06/28/2014   Enlarged prostate 06/28/2014   GERD (gastroesophageal reflux disease) 06/28/2014   History of adenomatous polyp of colon 06/26/2014   Gastrointestinal ulcer due to Helicobacter pylori 06/26/2014   Male erectile dysfunction, unspecified 07/24/2012   Incomplete emptying of bladder 07/24/2012   Elevated prostate specific antigen (PSA) 07/24/2012   Benign localized hyperplasia of prostate with urinary obstruction 07/24/2012    Social History   Tobacco Use   Smoking status: Never   Smokeless tobacco: Never  Substance Use Topics   Alcohol use: No    Alcohol/week: 0.0 standard drinks of alcohol    Allergies  Allergen Reactions   Brimonidine     Other reaction(s): Eye Redness   Brimonidine Tartrate     Other reaction(s): Eye redness   Brinzolamide-Brimonidine     Other reaction(s): Eye redness   Other Other (See Comments)    Simbrinza Eye Drops gives pt Eye redness    Current Meds  Medication Sig   albuterol  (PROVENTIL ) (2.5 MG/3ML) 0.083% nebulizer solution Take 3 mLs (2.5 mg total) by nebulization every 6 (six) hours as needed for wheezing or shortness of breath.   albuterol  (VENTOLIN  HFA) 108 (90 Base) MCG/ACT inhaler INHALE 2 PUFFS INTO THE LUNGS EVERY 4 HOURS AS NEEDED FOR WHEEZING OR SHORTNESS OF BREATH.   amLODipine  (NORVASC ) 5 MG tablet TAKE 1 TABLET BY MOUTH TWICE A DAY   ASPIRIN  81 PO Take by mouth.   benralizumab  (FASENRA  PEN) 30 MG/ML prefilled autoinjector Inject 30mg  in the skin at week 4 on 07/29/24 and week 8 on 08/26/24, then every 8 weeks thereafter.   benzonatate  (TESSALON ) 200 MG capsule Take 1 capsule (200 mg total) by mouth 3 (three) times daily as needed.   finasteride  (PROSCAR ) 5 MG tablet TAKE 1 TABLET (5 MG TOTAL) BY MOUTH DAILY.   fluticasone  (FLONASE) 50 MCG/ACT nasal spray Place 2 sprays into both nostrils as needed.   fluticasone  furoate-vilanterol (BREO ELLIPTA ) 100-25 MCG/ACT AEPB Inhale 1 puff into the lungs daily.    hydrALAZINE  (APRESOLINE ) 10 MG tablet TAKE 1 TABLET BY MOUTH THREE TIMES A DAY   latanoprost (XALATAN) 0.005 % ophthalmic solution ADMINISTER 1 DROP TO THE RIGHT EYE NIGHTLY.   omeprazole  (PRILOSEC) 40 MG capsule TAKE 1 CAPSULE (40 MG TOTAL) BY MOUTH DAILY.   predniSONE  (DELTASONE ) 20 MG tablet Take 1 tablet (20 mg total) by mouth daily with breakfast for 5 days.   rosuvastatin  (CRESTOR ) 20 MG tablet Take 1 tablet (20 mg total) by mouth daily.   tamsulosin  (FLOMAX ) 0.4 MG CAPS capsule TAKE 1 CAPSULE BY MOUTH EVERY DAY   timolol  (TIMOPTIC ) 0.5 % ophthalmic solution Place 1 drop into the right eye 2 (two) times daily.   valsartan  (DIOVAN ) 320 MG tablet Take 1 tablet (320 mg total) by mouth every evening.   vitamin B-12 (CYANOCOBALAMIN ) 1000 MCG tablet Take by mouth.    Immunization History  Administered Date(s) Administered   Fluad Quad(high Dose 65+) 08/27/2020, 07/11/2021, 06/16/2022   INFLUENZA, HIGH DOSE SEASONAL PF 06/13/2016, 08/20/2017, 07/31/2018, 08/22/2019, 06/15/2023, 07/17/2024   Influenza,inj,Quad PF,6+ Mos 07/20/2015   PFIZER Comirnaty(Gray Top)Covid-19 Tri-Sucrose Vaccine 02/26/2021, 06/28/2022   PFIZER(Purple Top)SARS-COV-2 Vaccination 10/30/2019, 11/20/2019, 07/05/2020   Pfizer Covid-19 Vaccine Bivalent Booster 55yrs &  up 07/01/2021   Pneumococcal Conjugate-13 06/10/2015   Pneumococcal Polysaccharide-23 06/30/2013   Respiratory Syncytial Virus Vaccine,Recomb Aduvanted(Arexvy) 10/07/2022   Tdap 10/25/2021   Zoster Recombinant(Shingrix) 07/06/2018, 08/15/2021   Zoster, Live 06/30/2013        Objective:     Vitals:   09/09/24 1540  BP: 106/76  Pulse: 65  Temp: 97.9 F (36.6 C)  Height: 5' 10 (1.778 m)  Weight: 153 lb 6.4 oz (69.6 kg)  SpO2: 99%  TempSrc: Temporal  BMI (Calculated): 22.01     SpO2: 99 %  GENERAL: Thin, fit appearing gentleman, looks younger than stated age, fully ambulatory, no conversational dyspnea HEAD: Normocephalic, atraumatic.  EYES:  Pupils equal, round, reactive to light.  No scleral icterus.  Strabismus with exotropia of right eye. MOUTH: Dentition intact, oral mucosa moist.  No thrush. NECK: Supple. No thyromegaly. Trachea midline. No JVD.  No adenopathy. PULMONARY: Good air entry bilaterally.  Rare end expiratory wheeze. CARDIOVASCULAR: S1 and S2. Regular rate and rhythm.  No rubs, murmurs or gallops heard. ABDOMEN: Benign. MUSCULOSKELETAL: No joint deformity, no clubbing, no edema.  NEUROLOGIC: No overt focal deficit, no gait disturbance, speech is fluent. SKIN: Intact,warm,dry. PSYCH: Mood and behavior normal.   Lab Results  Component Value Date   NITRICOXIDE 44 09/09/2024  This result suggests intermediate (25-49) Type 2 (T2) airway inflammation; clinical correlation required.      Assessment & Plan:     ICD-10-CM   1. Severe persistent asthma with acute exacerbation (HCC)  J45.51 Nitric oxide     AMB REFERRAL FOR DME    2. Eosinophilic asthma  G17.16 AMB REFERRAL FOR DME    3. Allergic rhinitis due to other allergic trigger, unspecified seasonality  J30.89 AMB REFERRAL FOR DME      Orders Placed This Encounter  Procedures   AMB REFERRAL FOR DME    Referral Priority:   Routine    Referral Type:   Durable Medical Equipment Purchase    Number of Visits Requested:   1   Nitric oxide     Meds ordered this encounter  Medications   predniSONE  (DELTASONE ) 20 MG tablet    Sig: Take 1 tablet (20 mg total) by mouth daily with breakfast for 5 days.    Dispense:  5 tablet    Refill:  0   albuterol  (PROVENTIL ) (2.5 MG/3ML) 0.083% nebulizer solution    Sig: Take 3 mLs (2.5 mg total) by nebulization every 6 (six) hours as needed for wheezing or shortness of breath.    Dispense:  75 mL    Refill:  2   Discussion:    Eosinophilic asthma Chronic eosinophilic asthma with mildly elevated nitric oxide  levels, likely exacerbated by recent weather changes. Current treatment includes Fasenra  and Breo.  Previous trial of higher dose Breo was not well tolerated. Recent episode of near asthma attack managed with emergency inhaler. No nebulizer at home, which he desires for emergency use. - Prescribed 5-day course of prednisone  20 mg/day to manage acute exacerbation. - Continue Fasenra  and Breo as current treatment regimen. - Provided nebulizer machine for home use. - Sent nebulizer medication prescription to pharmacy.  Shortness of breath secondary to eosinophilic asthma Shortness of breath associated with eosinophilic asthma, exacerbated by recent weather changes. Managed with emergency inhaler during recent near asthma attack. - Ensure availability of nebulizer for acute management of shortness of breath.    Advised if symptoms do not improve or worsen, to please contact office for sooner follow up or seek  emergency care.    I spent 32 minutes of dedicated to the care of this patient on the date of this encounter to include pre-visit review of records, face-to-face time with the patient discussing conditions above, post visit ordering of testing, clinical documentation with the electronic health record, making appropriate referrals as documented, and communicating necessary findings to members of the patients care team.     C. Leita Sanders, MD Advanced Bronchoscopy P[facility]    *This note was generated using voice recognition software/Dragon and/or AI transcription program.  Despite best efforts to proofread, errors can occur which can change the meaning. Any transcriptional errors that result from this process are unintentional and may not be fully corrected at the time of dictation.

## 2024-09-09 NOTE — Patient Instructions (Signed)
 VISIT SUMMARY:  Today, we discussed the management of your eosinophilic asthma, especially in light of the recent weather changes that have been affecting you. We reviewed your current medications and addressed your recent episode where you almost had an asthma attack.  YOUR PLAN:  -EOSINOPHILIC ASTHMA: Eosinophilic asthma is a type of asthma characterized by high levels of eosinophils, a type of white blood cell that causes inflammation in the airways. You will continue your current medications, Fasenra  and Breo, as they are helping manage your condition. We have prescribed a 5-day course of prednisone  to help with the recent exacerbation. Additionally, we have provided you with a nebulizer machine for home use and sent a prescription for the nebulizer medication to your pharmacy.  -SHORTNESS OF BREATH SECONDARY TO EOSINOPHILIC ASTHMA: Your shortness of breath is a symptom of your eosinophilic asthma, which has been worsened by the recent weather changes. To help manage this, we have ensured that you have a nebulizer at home for acute situations.  INSTRUCTIONS:  Please follow the 5-day course of prednisone  as prescribed. Continue using Fasenra  and Breo as directed. Use the nebulizer machine and medication as needed for acute shortness of breath. If you experience another severe episode or if your symptoms worsen, seek medical attention immediately.

## 2024-09-10 ENCOUNTER — Encounter: Payer: Self-pay | Admitting: Internal Medicine

## 2024-09-10 DIAGNOSIS — J8283 Eosinophilic asthma: Secondary | ICD-10-CM | POA: Insufficient documentation

## 2024-09-15 DIAGNOSIS — H2512 Age-related nuclear cataract, left eye: Secondary | ICD-10-CM | POA: Diagnosis not present

## 2024-09-15 DIAGNOSIS — H401112 Primary open-angle glaucoma, right eye, moderate stage: Secondary | ICD-10-CM | POA: Diagnosis not present

## 2024-09-15 DIAGNOSIS — H401123 Primary open-angle glaucoma, left eye, severe stage: Secondary | ICD-10-CM | POA: Diagnosis not present

## 2024-09-15 DIAGNOSIS — Z961 Presence of intraocular lens: Secondary | ICD-10-CM | POA: Diagnosis not present

## 2024-09-20 ENCOUNTER — Other Ambulatory Visit: Payer: Self-pay | Admitting: Internal Medicine

## 2024-09-21 ENCOUNTER — Other Ambulatory Visit: Payer: Self-pay | Admitting: *Deleted

## 2024-09-21 DIAGNOSIS — I10 Essential (primary) hypertension: Secondary | ICD-10-CM

## 2024-09-21 DIAGNOSIS — M6281 Muscle weakness (generalized): Secondary | ICD-10-CM

## 2024-09-21 DIAGNOSIS — R748 Abnormal levels of other serum enzymes: Secondary | ICD-10-CM

## 2024-09-26 ENCOUNTER — Other Ambulatory Visit

## 2024-09-26 DIAGNOSIS — I10 Essential (primary) hypertension: Secondary | ICD-10-CM | POA: Diagnosis not present

## 2024-09-26 DIAGNOSIS — M6281 Muscle weakness (generalized): Secondary | ICD-10-CM | POA: Diagnosis not present

## 2024-09-26 DIAGNOSIS — R748 Abnormal levels of other serum enzymes: Secondary | ICD-10-CM

## 2024-09-26 LAB — LIPID PANEL
Cholesterol: 144 mg/dL (ref 28–200)
HDL: 73.9 mg/dL
LDL Cholesterol: 63 mg/dL (ref 10–99)
NonHDL: 70.38
Total CHOL/HDL Ratio: 2
Triglycerides: 37 mg/dL (ref 10.0–149.0)
VLDL: 7.4 mg/dL (ref 0.0–40.0)

## 2024-09-26 LAB — CBC WITH DIFFERENTIAL/PLATELET
Basophils Absolute: 0 K/uL (ref 0.0–0.1)
Basophils Relative: 0.4 % (ref 0.0–3.0)
Eosinophils Absolute: 0 K/uL (ref 0.0–0.7)
Eosinophils Relative: 0 % (ref 0.0–5.0)
HCT: 39.9 % (ref 39.0–52.0)
Hemoglobin: 12.9 g/dL — ABNORMAL LOW (ref 13.0–17.0)
Lymphocytes Relative: 30.1 % (ref 12.0–46.0)
Lymphs Abs: 0.7 K/uL (ref 0.7–4.0)
MCHC: 32.4 g/dL (ref 30.0–36.0)
MCV: 85.2 fl (ref 78.0–100.0)
Monocytes Absolute: 0.3 K/uL (ref 0.1–1.0)
Monocytes Relative: 11.5 % (ref 3.0–12.0)
Neutro Abs: 1.4 K/uL (ref 1.4–7.7)
Neutrophils Relative %: 58 % (ref 43.0–77.0)
Platelets: 110 K/uL — ABNORMAL LOW (ref 150.0–400.0)
RBC: 4.68 Mil/uL (ref 4.22–5.81)
RDW: 14.9 % (ref 11.5–15.5)
WBC: 2.5 K/uL — ABNORMAL LOW (ref 4.0–10.5)

## 2024-09-26 LAB — BASIC METABOLIC PANEL WITH GFR
BUN: 13 mg/dL (ref 6–23)
CO2: 30 meq/L (ref 19–32)
Calcium: 9.9 mg/dL (ref 8.4–10.5)
Chloride: 102 meq/L (ref 96–112)
Creatinine, Ser: 1.28 mg/dL (ref 0.40–1.50)
GFR: 52.06 mL/min — ABNORMAL LOW
Glucose, Bld: 84 mg/dL (ref 70–99)
Potassium: 4.1 meq/L (ref 3.5–5.1)
Sodium: 138 meq/L (ref 135–145)

## 2024-09-26 LAB — HEPATIC FUNCTION PANEL
ALT: 16 U/L (ref 3–53)
AST: 22 U/L (ref 5–37)
Albumin: 4.3 g/dL (ref 3.5–5.2)
Alkaline Phosphatase: 63 U/L (ref 39–117)
Bilirubin, Direct: 0.2 mg/dL (ref 0.1–0.3)
Total Bilirubin: 0.7 mg/dL (ref 0.2–1.2)
Total Protein: 6.3 g/dL (ref 6.0–8.3)

## 2024-09-26 LAB — CK: Total CK: 205 U/L (ref 17–232)

## 2024-09-28 ENCOUNTER — Ambulatory Visit: Payer: Self-pay | Admitting: Internal Medicine

## 2024-09-30 ENCOUNTER — Ambulatory Visit (INDEPENDENT_AMBULATORY_CARE_PROVIDER_SITE_OTHER): Admitting: Internal Medicine

## 2024-09-30 ENCOUNTER — Encounter: Payer: Self-pay | Admitting: Internal Medicine

## 2024-09-30 VITALS — BP 136/82 | HR 64 | Temp 97.9°F | Ht 68.5 in | Wt 151.8 lb

## 2024-09-30 DIAGNOSIS — E611 Iron deficiency: Secondary | ICD-10-CM | POA: Diagnosis not present

## 2024-09-30 DIAGNOSIS — E041 Nontoxic single thyroid nodule: Secondary | ICD-10-CM | POA: Diagnosis not present

## 2024-09-30 DIAGNOSIS — J8283 Eosinophilic asthma: Secondary | ICD-10-CM | POA: Diagnosis not present

## 2024-09-30 DIAGNOSIS — D696 Thrombocytopenia, unspecified: Secondary | ICD-10-CM

## 2024-09-30 DIAGNOSIS — D72819 Decreased white blood cell count, unspecified: Secondary | ICD-10-CM | POA: Diagnosis not present

## 2024-09-30 DIAGNOSIS — E7800 Pure hypercholesterolemia, unspecified: Secondary | ICD-10-CM | POA: Diagnosis not present

## 2024-09-30 DIAGNOSIS — I10 Essential (primary) hypertension: Secondary | ICD-10-CM | POA: Diagnosis not present

## 2024-09-30 DIAGNOSIS — R748 Abnormal levels of other serum enzymes: Secondary | ICD-10-CM

## 2024-09-30 DIAGNOSIS — M6281 Muscle weakness (generalized): Secondary | ICD-10-CM

## 2024-09-30 DIAGNOSIS — R0602 Shortness of breath: Secondary | ICD-10-CM | POA: Diagnosis not present

## 2024-09-30 DIAGNOSIS — K219 Gastro-esophageal reflux disease without esophagitis: Secondary | ICD-10-CM | POA: Diagnosis not present

## 2024-09-30 DIAGNOSIS — Z860100 Personal history of colon polyps, unspecified: Secondary | ICD-10-CM | POA: Diagnosis not present

## 2024-09-30 MED ORDER — HYDRALAZINE HCL 10 MG PO TABS: ORAL_TABLET | ORAL | 1 refills | Status: AC

## 2024-09-30 NOTE — Progress Notes (Signed)
 "  Subjective:    Patient ID: Jose Perry, male    DOB: 1940-11-08, 83 y.o.   MRN: [id number]  Patient here for a scheduled follow up.   HPI Here for a scheduled follow up - follow up regarding hypertension and asthma. Taking diovan  and amlodipine  2.5mg  q day. Unable to titrate up amlodipine  due to increased swelling. Saw cardiology 07/28/24 - recommended to restart crestor . Had f/u with Dr Stoiorff 08/06/24 - recommended to continue tamsulosin /finasteride . Had f/u with Dr Tamea 09/09/24 - eosinophilic asthma. Recommended to continue fasenra  and breo. Recommended nebulizer - home use. Prednisone . Reports his breathing is ok. Overall improved. Still notices some change, but overall improved. Energy/fatigue has improved. Able to do more exercise - improving. No chest pain reported. No abdominal pain or bowel change reported.    Past Medical History:  Diagnosis Date   Asthma 1945   Cataract 2018   Dyspnea    Elevated blood pressure    Enlarged prostate    GERD (gastroesophageal reflux disease) 2015   Glaucoma 2018   History of adenomatous polyp of colon    Hx of colonic polyps    Hypertension 2018   Leukopenia    has had an extensive w/up.     Liver hemangioma 01/24/2018   Thyroid  goiter    Past Surgical History:  Procedure Laterality Date   BACK SURGERY  2002   ruptured disc   CATARACT EXTRACTION W/PHACO Right 06/30/2020   Procedure: CATARACT EXTRACTION PHACO AND INTRAOCULAR LENS PLACEMENT (IOC) RIGHT, [place] 7.19 01:30.8 7.9%;  Surgeon: Mittie Gaskin, MD;  Location: [facility];  Service: Ophthalmology;  Laterality: Right;   COLONOSCOPY W/ POLYPECTOMY  04/24/2005, 07/06/2010, 07/08/2014   COLONOSCOPY WITH PROPOFOL  N/A 02/01/2018   Procedure: COLONOSCOPY WITH PROPOFOL ;  Surgeon: Viktoria Lamar DASEN, MD;  Location: [facility];  Service: Endoscopy;  Laterality: N/A;   COLONOSCOPY WITH PROPOFOL  N/A 10/09/2023   Procedure: COLONOSCOPY WITH PROPOFOL ;  Surgeon: Toledo,  Ladell POUR, MD;  Location: [facility] ENDOSCOPY;  Service: Gastroenterology;  Laterality: N/A;   ESOPHAGOGASTRODUODENOSCOPY  04/24/2005, 07/06/2010,   ESOPHAGOGASTRODUODENOSCOPY (EGD) WITH PROPOFOL  N/A 05/10/2022   Procedure: ESOPHAGOGASTRODUODENOSCOPY (EGD) WITH PROPOFOL ;  Surgeon: Toledo, Ladell POUR, MD;  Location: [facility] ENDOSCOPY;  Service: Gastroenterology;  Laterality: N/A;   EYE SURGERY  2009   relieve pressure from glaucoma   POLYPECTOMY  10/09/2023   Procedure: POLYPECTOMY;  Surgeon: Toledo, Ladell POUR, MD;  Location: [facility] ENDOSCOPY;  Service: Gastroenterology;;   THYROID  SURGERY  1968   goiter   Family History  Problem Relation Age of Onset   Heart disease Mother    Alcohol abuse Father    Hyperlipidemia Father    Stroke Father    Social History   Socioeconomic History   Marital status: Married    Spouse name: Not on file   Number of children: Not on file   Years of education: Not on file   Highest education level: 12th grade  Occupational History   Not on file  Tobacco Use   Smoking status: Never   Smokeless tobacco: Never  Vaping Use   Vaping status: Never Used  Substance and Sexual Activity   Alcohol use: No    Alcohol/week: 0.0 standard drinks of alcohol   Drug use: No   Sexual activity: Yes  Other Topics Concern   Not on file  Social History Narrative   married   Social Drivers of Health   Tobacco Use: Low Risk (10/06/2024)   Patient History  Smoking Tobacco Use: Never    Smokeless Tobacco Use: Never    Passive Exposure: Not on file  Financial Resource Strain: Low Risk (09/26/2024)   Overall Financial Resource Strain (CARDIA)    Difficulty of Paying Living Expenses: Not hard at all  Food Insecurity: No Food Insecurity (09/26/2024)   Epic    Worried About Programme Researcher, Broadcasting/film/video in the Last Year: Never true    Ran Out of Food in the Last Year: Never true  Transportation Needs: No Transportation Needs (09/26/2024)   Epic    Lack of Transportation (Medical): No     Lack of Transportation (Non-Medical): No  Physical Activity: Sufficiently Active (09/26/2024)   Exercise Vital Sign    Days of Exercise per Week: 7 days    Minutes of Exercise per Session: 70 min  Stress: No Stress Concern Present (09/26/2024)   [employer] of Occupational Health - Occupational Stress Questionnaire    Feeling of Stress: Not at all  Social Connections: Unknown (09/26/2024)   Social Connection and Isolation Panel    Frequency of Communication with Friends and Family: Patient declined    Frequency of Social Gatherings with Friends and Family: Patient declined    Attends Religious Services: Patient declined    Active Member of Clubs or Organizations: No    Attends Engineer, Structural: Not on file    Marital Status: Married  Depression (PHQ2-9): Low Risk (06/26/2024)   Depression (PHQ2-9)    PHQ-2 Score: 0  Alcohol Screen: Low Risk (10/30/2023)   Alcohol Screen    Last Alcohol Screening Score (AUDIT): 0  Housing: High Risk (09/26/2024)   Epic    Unable to Pay for Housing in the Last Year: Yes    Number of Times Moved in the Last Year: 0    Homeless in the Last Year: No  Utilities: Not At Risk (10/30/2023)   [employer] Utilities    Threatened with loss of utilities: No  Health Literacy: Adequate Health Literacy (10/30/2023)   B1300 Health Literacy    Frequency of need for help with medical instructions: Never     Review of Systems  Constitutional:  Negative for appetite change and unexpected weight change.  HENT:  Negative for congestion and sinus pressure.   Respiratory:  Negative for cough, chest tightness and shortness of breath.   Cardiovascular:  Negative for chest pain, palpitations and leg swelling.  Gastrointestinal:  Negative for abdominal pain, diarrhea, nausea and vomiting.  Genitourinary:  Negative for difficulty urinating and dysuria.  Musculoskeletal:  Negative for joint swelling and myalgias.  Skin:  Negative for color change and rash.   Neurological:  Negative for dizziness and headaches.  Psychiatric/Behavioral:  Negative for agitation and dysphoric mood.        Objective:     BP 136/82   Pulse 64   Temp 97.9 F (36.6 C) (Oral)   Ht 5' 8.5 (1.74 m)   Wt 151 lb 12.8 oz (68.9 kg)   SpO2 98%   BMI 22.75 kg/m  Wt Readings from Last 3 Encounters:  09/30/24 151 lb 12.8 oz (68.9 kg)  09/09/24 153 lb 6.4 oz (69.6 kg)  08/11/24 155 lb (70.3 kg)    Physical Exam Vitals reviewed.  Constitutional:      General: He is not in acute distress.    Appearance: Normal appearance. He is well-developed.  HENT:     Head: Normocephalic and atraumatic.     Right Ear: External ear normal.  Left Ear: External ear normal.     Mouth/Throat:     Pharynx: No oropharyngeal exudate or posterior oropharyngeal erythema.  Eyes:     General: No scleral icterus.       Right eye: No discharge.        Left eye: No discharge.     Conjunctiva/sclera: Conjunctivae normal.  Cardiovascular:     Rate and Rhythm: Normal rate and regular rhythm.  Pulmonary:     Effort: Pulmonary effort is normal. No respiratory distress.     Breath sounds: Normal breath sounds.  Abdominal:     General: Bowel sounds are normal.     Palpations: Abdomen is soft.     Tenderness: There is no abdominal tenderness.  Musculoskeletal:        General: No swelling or tenderness.     Cervical back: Neck supple. No tenderness.  Lymphadenopathy:     Cervical: No cervical adenopathy.  Skin:    Findings: No erythema or rash.  Neurological:     Mental Status: He is alert.  Psychiatric:        Mood and Affect: Mood normal.        Behavior: Behavior normal.         Outpatient Encounter Medications as of 09/30/2024  Medication Sig   albuterol  (PROVENTIL ) (2.5 MG/3ML) 0.083% nebulizer solution Take 3 mLs (2.5 mg total) by nebulization every 6 (six) hours as needed for wheezing or shortness of breath.   albuterol  (VENTOLIN  HFA) 108 (90 Base) MCG/ACT inhaler  INHALE 2 PUFFS INTO THE LUNGS EVERY 4 HOURS AS NEEDED FOR WHEEZING OR SHORTNESS OF BREATH.   amLODipine  (NORVASC ) 5 MG tablet TAKE 1 TABLET BY MOUTH TWICE A DAY   ASPIRIN  81 PO Take by mouth.   benralizumab  (FASENRA  PEN) 30 MG/ML prefilled autoinjector Inject 30mg  in the skin at week 4 on 07/29/24 and week 8 on 08/26/24, then every 8 weeks thereafter.   finasteride  (PROSCAR ) 5 MG tablet TAKE 1 TABLET (5 MG TOTAL) BY MOUTH DAILY.   fluticasone  furoate-vilanterol (BREO ELLIPTA ) 100-25 MCG/ACT AEPB Inhale 1 puff into the lungs daily.   latanoprost (XALATAN) 0.005 % ophthalmic solution ADMINISTER 1 DROP TO THE RIGHT EYE NIGHTLY.   omeprazole  (PRILOSEC) 40 MG capsule TAKE 1 CAPSULE (40 MG TOTAL) BY MOUTH DAILY.   rosuvastatin  (CRESTOR ) 20 MG tablet Take 1 tablet (20 mg total) by mouth daily.   tamsulosin  (FLOMAX ) 0.4 MG CAPS capsule TAKE 1 CAPSULE BY MOUTH EVERY DAY   timolol  (TIMOPTIC ) 0.5 % ophthalmic solution Place 1 drop into the right eye 2 (two) times daily.   valsartan  (DIOVAN ) 320 MG tablet Take 1 tablet (320 mg total) by mouth every evening.   vitamin B-12 (CYANOCOBALAMIN ) 1000 MCG tablet Take by mouth.   [DISCONTINUED] fluticasone  (FLONASE) 50 MCG/ACT nasal spray Place 2 sprays into both nostrils as needed.   hydrALAZINE  (APRESOLINE ) 10 MG tablet One tablet in am and one tablet midday prn and two tablets in pm.   [DISCONTINUED] benzonatate  (TESSALON ) 200 MG capsule Take 1 capsule (200 mg total) by mouth 3 (three) times daily as needed.   [DISCONTINUED] hydrALAZINE  (APRESOLINE ) 10 MG tablet TAKE 1 TABLET BY MOUTH THREE TIMES A DAY   No facility-administered encounter medications on file as of 09/30/2024.     Lab Results  Component Value Date   WBC 2.5 (L) 09/26/2024   HGB 12.9 (L) 09/26/2024   HCT 39.9 09/26/2024   PLT 110.0 (L) 09/26/2024   GLUCOSE 84 09/26/2024  CHOL 144 09/26/2024   TRIG 37.0 09/26/2024   HDL 73.90 09/26/2024   LDLCALC 63 09/26/2024   ALT 16 09/26/2024   AST  22 09/26/2024   NA 138 09/26/2024   K 4.1 09/26/2024   CL 102 09/26/2024   CREATININE 1.28 09/26/2024   BUN 13 09/26/2024   CO2 30 09/26/2024   TSH 2.03 06/23/2024   PSA 2.86 07/11/2021   INR 1.0 11/30/2023       Assessment & Plan:  Leukopenia, unspecified type Assessment & Plan: Has been previously evaluated by hematology. Recent wbc count 2-9. Also found to have decreased platelet count and slightly decreased hgb. Discussed f/u with hematology. Wants to hold at this time. Follow cbc.   Orders: -     CBC with Differential/Platelet; Future -     Vitamin B12; Future  Elevated CK  Muscle weakness Assessment & Plan: CK has normalized. Muscle weakness improved. Follow. Is seeing rheumatology.    Primary hypertension Assessment & Plan: Currently on diovan  320mg  q day.  Taking amlodipine  2.5mg  bid at times as outlined. Had swelling with increased dose of amlodipine . Responds well to amlodipine , but cannot tolerate increasing the dose. Also taking and tolerating hydralazine . Pressures elevated in am and trend down in pm. Discussed adjusting timing he takes hydralazine  - can double up pm dose. Follow pressures. Follow metabolic panel.   Orders: -     Basic metabolic panel with GFR; Future  Hypercholesterolemia Assessment & Plan: Follow lipid panel. Continue crestor .   Orders: -     Hepatic function panel; Future -     Lipid panel; Future  Thyroid  nodule Assessment & Plan:  Followed by endocrinology - 10/2021.  Recommended f/u thyroid  ultrasound in one year.  Ultrasound 10/2022 - stable.  Recommended f/u in 2 years. Follow tsh.    Thrombocytopenia Assessment & Plan: Recent platelet coung 110. Also noticed to have decreased white blood cell count 2.9 and hgb 12.9. has seen hematology previously. Discussed f/u. Wants to hold at this time. Follow cbc. Contact hematology to determine of any further w/up at this time.   Orders: -     Vitamin B12; Future  Shortness of  breath Assessment & Plan: Has seen cardiology with w/up as outlined.  Had CTA - mild non obstructive CAD.   Saw cardiology 07/28/24 - recommended to restart crestor . Had f/u with Dr Stoiorff 08/06/24 - recommended to continue tamsulosin /finasteride . Had f/u with Dr Tamea 09/09/24 - eosinophilic asthma. Recommended to continue fasenra  and breo. Recommended nebulizer - home use. Prednisone . Reports his breathing is ok. Continue f/u with pulmonary and cardiology.    Iron deficiency Assessment & Plan: Saw GI. Colonoscopy 2024 as outlined.  Follow cbc and iron studies.   Orders: -     Vitamin B12; Future  History of colonic polyps Assessment & Plan: Colonoscopy 10/09/23 - pathology - Ascending  Colon Polyp - TUBULAR ADENOMA, INFLAMED (MULTIPLE FRAGMENTS). NEGATIVE FOR HIGH-GRADE DYSPLASIA OR MALIGNANCY    Gastroesophageal reflux disease, unspecified whether esophagitis present Assessment & Plan: Continues on prilosec. No upper symptoms reported.    Eosinophilic asthma Assessment & Plan: Had f/u with Dr Tamea 09/09/24 - eosinophilic asthma. Recommended to continue fasenra  and breo. Recommended nebulizer - home use. Prednisone . Reports his breathing is ok. Overall improved.    Other orders -     hydrALAZINE  HCl; One tablet in am and one tablet midday prn and two tablets in pm.  Dispense: 360 tablet; Refill: 1     Allena Hamilton,  MD "

## 2024-10-06 ENCOUNTER — Encounter: Payer: Self-pay | Admitting: Internal Medicine

## 2024-10-06 NOTE — Assessment & Plan Note (Signed)
 CK has normalized. Muscle weakness improved. Follow. Is seeing rheumatology.

## 2024-10-06 NOTE — Assessment & Plan Note (Signed)
 Recent platelet coung 110. Also noticed to have decreased white blood cell count 2.9 and hgb 12.9. has seen hematology previously. Discussed f/u. Wants to hold at this time. Follow cbc. Contact hematology to determine of any further w/up at this time.

## 2024-10-06 NOTE — Assessment & Plan Note (Signed)
 Continues on prilosec. No upper symptoms reported.

## 2024-10-06 NOTE — Assessment & Plan Note (Signed)
Follow lipid panel. Continue crestor.  

## 2024-10-06 NOTE — Assessment & Plan Note (Signed)
 Colonoscopy 10/09/23 - pathology - [facility]ending  Colon Polyp - TUBULAR ADENOMA, INFLAMED (MULTIPLE FRAGMENTS). NEGATIVE FOR HIGH-GRADE DYSPLASIA OR MALIGNANCY

## 2024-10-06 NOTE — Assessment & Plan Note (Signed)
 Saw GI. Colonoscopy 2024 as outlined.  Follow cbc and iron studies.

## 2024-10-06 NOTE — Assessment & Plan Note (Signed)
 Currently on diovan  320mg  q day.  Taking amlodipine  2.5mg  bid at times as outlined. Had swelling with increased dose of amlodipine . Responds well to amlodipine , but cannot tolerate increasing the dose. Also taking and tolerating hydralazine . Pressures elevated in am and trend down in pm. Discussed adjusting timing he takes hydralazine  - can double up pm dose. Follow pressures. Follow metabolic panel.

## 2024-10-06 NOTE — Assessment & Plan Note (Signed)
 Has been previously evaluated by hematology. Recent wbc count 2-9. Also found to have decreased platelet count and slightly decreased hgb. Discussed f/u with hematology. Wants to hold at this time. Follow cbc.

## 2024-10-06 NOTE — Assessment & Plan Note (Signed)
 Had f/u with Dr Tamea 09/09/24 - eosinophilic asthma. Recommended to continue fasenra  and breo. Recommended nebulizer - home use. Prednisone . Reports his breathing is ok. Overall improved.

## 2024-10-06 NOTE — Assessment & Plan Note (Signed)
 Followed by endocrinology - 10/2021.  Recommended f/u thyroid ultrasound in one year.  Ultrasound 10/2022 - stable.  Recommended f/u in 2 years. Follow tsh.

## 2024-10-06 NOTE — Assessment & Plan Note (Signed)
 Has seen cardiology with w/up as outlined.  Had CTA - mild non obstructive CAD.   Saw cardiology 07/28/24 - recommended to restart crestor . Had f/u with Dr Stoiorff 08/06/24 - recommended to continue tamsulosin /finasteride . Had f/u with Dr Tamea 09/09/24 - eosinophilic asthma. Recommended to continue fasenra  and breo. Recommended nebulizer - home use. Prednisone . Reports his breathing is ok. Continue f/u with pulmonary and cardiology.

## 2024-11-03 ENCOUNTER — Ambulatory Visit: Payer: Medicare Other

## 2024-11-03 VITALS — Ht 68.5 in | Wt 155.0 lb

## 2024-11-03 DIAGNOSIS — Z0000 Encounter for general adult medical examination without abnormal findings: Secondary | ICD-10-CM

## 2024-11-03 NOTE — Progress Notes (Signed)
 "  Chief Complaint  Patient presents with   Medicare Wellness     Subjective:   Jose Perry is a 84 y.o. male who presents for a Medicare Annual Wellness Visit.  Visit info / Clinical Intake: Medicare Wellness Visit Type:: Subsequent Annual Wellness Visit Persons participating in visit and providing information:: patient Medicare Wellness Visit Mode:: Telephone If telephone:: video declined Since this visit was completed virtually, some vitals may be partially provided or unavailable. Missing vitals are due to the limitations of the virtual format.: Unable to obtain vitals - no equipment If Telephone or Video please confirm:: I connected with patient using audio/video enable telemedicine. I verified patient identity with two identifiers, discussed telehealth limitations, and patient agreed to proceed. Patient Location:: Home Provider Location:: Office/Home Interpreter Needed?: No Pre-visit prep was completed: yes AWV questionnaire completed by patient prior to visit?: no Living arrangements:: lives with spouse/significant other Patient's Overall Health Status Rating: good Typical amount of pain: none Does pain affect daily life?: no Are you currently prescribed opioids?: no  Dietary Habits and Nutritional Risks How many meals a day?: 2 Eats fruit and vegetables daily?: yes Most meals are obtained by: preparing own meals In the last 2 weeks, have you had any of the following?: none Diabetic:: no  Functional Status Activities of Daily Living (to include ambulation/medication): Independent Ambulation: Independent Medication Administration: Independent Home Management (perform basic housework or laundry): Independent Manage your own finances?: yes Primary transportation is: driving Concerns about vision?: no *vision screening is required for WTM* Concerns about hearing?: no  Fall Screening Falls in the past year?: 0 Number of falls in past year: 0 Was there an injury  with Fall?: 0 Fall Risk Category Calculator: 0 Patient Fall Risk Level: Low Fall Risk  Fall Risk Patient at Risk for Falls Due to: No Fall Risks Fall risk Follow up: Falls evaluation completed; Falls prevention discussed  Home and Transportation Safety: All rugs have non-skid backing?: N/A, no rugs All stairs or steps have railings?: yes Grab bars in the bathtub or shower?: (!) no Have non-skid surface in bathtub or shower?: (!) no Good home lighting?: yes Regular seat belt use?: yes Hospital stays in the last year:: no  Cognitive Assessment Difficulty concentrating, remembering, or making decisions? : no Will 6CIT or Mini Cog be Completed: yes What year is it?: 0 points What month is it?: 0 points Give patient an address phrase to remember (5 components): [address] [place] About what time is it?: 0 points Count backwards from 20 to 1: 0 points Say the months of the year in reverse: 0 points Repeat the address phrase from earlier: 0 points 6 CIT Score: 0 points  Advance Directives (For Healthcare) Does Patient Have a Medical Advance Directive?: No Would patient like information on creating a medical advance directive?: -- (Patient has the paperwork and working on it)  Reviewed/Updated  Reviewed/Updated: Reviewed All (Medical, Surgical, Family, Medications, Allergies, Care Teams, Patient Goals)    Allergies (verified) Brimonidine, Brimonidine tartrate, Brinzolamide-brimonidine, and Other   Current Medications (verified) Outpatient Encounter Medications as of 11/03/2024  Medication Sig   albuterol  (PROVENTIL ) (2.5 MG/3ML) 0.083% nebulizer solution Take 3 mLs (2.5 mg total) by nebulization every 6 (six) hours as needed for wheezing or shortness of breath.   albuterol  (VENTOLIN  HFA) 108 (90 Base) MCG/ACT inhaler INHALE 2 PUFFS INTO THE LUNGS EVERY 4 HOURS AS NEEDED FOR WHEEZING OR SHORTNESS OF BREATH.   amLODipine  (NORVASC ) 5 MG tablet TAKE 1  TABLET BY MOUTH TWICE A  DAY (Patient taking differently: Take 5 mg by mouth daily.)   ASPIRIN  81 PO Take by mouth.   benralizumab  (FASENRA  PEN) 30 MG/ML prefilled autoinjector Inject 30mg  in the skin at week 4 on 07/29/24 and week 8 on 08/26/24, then every 8 weeks thereafter.   finasteride  (PROSCAR ) 5 MG tablet TAKE 1 TABLET (5 MG TOTAL) BY MOUTH DAILY.   fluticasone  furoate-vilanterol (BREO ELLIPTA ) 100-25 MCG/ACT AEPB Inhale 1 puff into the lungs daily.   hydrALAZINE  (APRESOLINE ) 10 MG tablet One tablet in am and one tablet midday prn and two tablets in pm.   latanoprost (XALATAN) 0.005 % ophthalmic solution ADMINISTER 1 DROP TO THE RIGHT EYE NIGHTLY.   omeprazole  (PRILOSEC) 40 MG capsule TAKE 1 CAPSULE (40 MG TOTAL) BY MOUTH DAILY.   rosuvastatin  (CRESTOR ) 20 MG tablet Take 1 tablet (20 mg total) by mouth daily.   tamsulosin  (FLOMAX ) 0.4 MG CAPS capsule TAKE 1 CAPSULE BY MOUTH EVERY DAY   timolol  (TIMOPTIC ) 0.5 % ophthalmic solution Place 1 drop into the right eye 2 (two) times daily.   valsartan  (DIOVAN ) 320 MG tablet Take 1 tablet (320 mg total) by mouth every evening.   vitamin B-12 (CYANOCOBALAMIN ) 1000 MCG tablet Take by mouth.   No facility-administered encounter medications on file as of 11/03/2024.    History: Past Medical History:  Diagnosis Date   Asthma 1945   Cataract 2018   Dyspnea    Elevated blood pressure    Enlarged prostate    GERD (gastroesophageal reflux disease) 2015   Glaucoma 2018   History of adenomatous polyp of colon    Hx of colonic polyps    Hypertension 2018   Leukopenia    has had an extensive w/up.     Liver hemangioma 01/24/2018   Thyroid  goiter    Past Surgical History:  Procedure Laterality Date   BACK SURGERY  2002   ruptured disc   CATARACT EXTRACTION W/PHACO Right 06/30/2020   Procedure: CATARACT EXTRACTION PHACO AND INTRAOCULAR LENS PLACEMENT (IOC) RIGHT, [place] 7.19 01:30.8 7.9%;  Surgeon: Mittie Gaskin, MD;  [facility];  Service:  Ophthalmology;  Laterality: Right;   COLONOSCOPY W/ POLYPECTOMY  04/24/2005, 07/06/2010, 07/08/2014   COLONOSCOPY WITH PROPOFOL  N/A 02/01/2018   Procedure: COLONOSCOPY WITH PROPOFOL ;  Surgeon: Viktoria Lamar DASEN, MD;  Location: [facility];  Service: Endoscopy;  Laterality: N/A;   COLONOSCOPY WITH PROPOFOL  N/A 10/09/2023   Procedure: COLONOSCOPY WITH PROPOFOL ;  Surgeon: Toledo, Ladell POUR, MD;  Location: [facility] ENDOSCOPY;  Service: Gastroenterology;  Laterality: N/A;   ESOPHAGOGASTRODUODENOSCOPY  04/24/2005, 07/06/2010,   ESOPHAGOGASTRODUODENOSCOPY (EGD) WITH PROPOFOL  N/A 05/10/2022   Procedure: ESOPHAGOGASTRODUODENOSCOPY (EGD) WITH PROPOFOL ;  Surgeon: Toledo, Ladell POUR, MD;  Location: [facility] END[facility]COPY;  Service: Gastroenterology;  Laterality: N/A;   EYE SURGERY  2009   relieve pressure from glaucoma   POLYPECTOMY  10/09/2023   Procedure: POLYPECTOMY;  Surgeon: Toledo, Ladell POUR, MD;  Location: [facility] ENDOSCOPY;  Service: Gastroenterology;;   THYROID  SURGERY  1968   goiter   Family History  Problem Relation Age of Onset   Heart disease Mother    Alcohol abuse Father    Hyperlipidemia Father    Stroke Father    Social History   Occupational History   Not on file  Tobacco Use   Smoking status: Never   Smokeless tobacco: Never  Vaping Use   Vaping status: Never Used  Substance and Sexual Activity   Alcohol use: No  Alcohol/week: 0.0 standard drinks of alcohol   Drug use: No   Sexual activity: Yes   Tobacco Counseling Counseling given: Not Answered  SDOH Screenings   Food Insecurity: No Food Insecurity (11/03/2024)  Housing: Low Risk (11/03/2024)  Recent Concern: Housing - High Risk (09/26/2024)  Transportation Needs: No Transportation Needs (11/03/2024)  Utilities: Not At Risk (10/30/2023)  Alcohol Screen: Low Risk (11/03/2024)  Depression (PHQ2-9): Low Risk (11/03/2024)  Financial Resource Strain: Low Risk (11/03/2024)  Physical Activity: Sufficiently Active (11/03/2024)  Social  Connections: Moderately Isolated (11/03/2024)  Stress: No Stress Concern Present (11/03/2024)  Tobacco Use: Low Risk (11/03/2024)  Health Literacy: Adequate Health Literacy (11/03/2024)   See flowsheets for full screening details  Depression Screen PHQ 2 & 9 Depression Scale- Over the past 2 weeks, how often have you been bothered by any of the following problems? Little interest or pleasure in doing things: 0 Feeling down, depressed, or hopeless (PHQ Adolescent also includes...irritable): 0 PHQ-2 Total Score: 0 Trouble falling or staying asleep, or sleeping too much: 0 Feeling tired or having little energy: 0 Poor appetite or overeating (PHQ Adolescent also includes...weight loss): 0 Feeling bad about yourself - or that you are a failure or have let yourself or your family down: 0 Trouble concentrating on things, such as reading the newspaper or watching television (PHQ Adolescent also includes...like school work): 0 Moving or speaking so slowly that other people could have noticed. Or the opposite - being so fidgety or restless that you have been moving around a lot more than usual: 0 Thoughts that you would be better off dead, or of hurting yourself in some way: 0 PHQ-9 Total Score: 0 If you checked off any problems, how difficult have these problems made it for you to do your work, take care of things at home, or get along with other people?: Not difficult at all     Goals Addressed             This Visit's Progress    Patient Stated       Wants to continue to exercise daily             Objective:    Today's Vitals   11/03/24 1306  Weight: 155 lb (70.3 kg)  Height: 5' 8.5 (1.74 m)   Body mass index is 23.22 kg/m.  Hearing/Vision screen Hearing Screening - Comments:: No issues Vision Screening - Comments:: Glasses, [place] [facility], up to date Immunizations and Health Maintenance Health Maintenance  Topic Date Due   COVID-19 Vaccine (7 - 2025-26 season) 06/09/2024    Medicare Annual Wellness (AWV)  11/03/2025   Colonoscopy  10/08/2028   DTaP/Tdap/Td (2 - Td or Tdap) 10/26/2031   Pneumococcal Vaccine: 50+ Years  Completed   Influenza Vaccine  Completed   Zoster Vaccines- Shingrix  Completed   Meningococcal B Vaccine  Aged Out   Hepatitis C Screening  Discontinued        Assessment/Plan:  This is a routine wellness examination for Rainer.  Patient Care Team: Glendia Shad, MD as PCP - General (Internal Medicine) Darliss Rogue, MD as PCP - Cardiology (Cardiology) Tamea Dedra CROME, MD as Consulting Physician (Pulmonary Disease) Toledo, Teodoro K, MD as Consulting Physician (Gastroenterology)  I have personally reviewed and noted the following in the patients chart:   Medical and social history Use of alcohol, tobacco or illicit drugs  Current medications and supplements including opioid prescriptions. Functional ability and status Nutritional status Physical activity Advanced directives List of  other physicians Hospitalizations, surgeries, and ER visits in previous 12 months Vitals Screenings to include cognitive, depression, and falls Referrals and appointments  No orders of the defined types were placed in this encounter.  In addition, I have reviewed and discussed with patient certain preventive protocols, quality metrics, and best practice recommendations. A written personalized care plan for preventive services as well as general preventive health recommendations were provided to patient.   Angeline Fredericks, LPN   8/73/7973   Return in 1 year (on 11/03/2025).  After Visit Summary: (MyChart) Due to this being a telephonic visit, the after visit summary with patients personalized plan was offered to patient via MyChart   Nurse Notes: Patient declines covid vaccine.  "

## 2024-11-03 NOTE — Patient Instructions (Signed)
 [name],  Thank you for taking the time for your Medicare Wellness Visit. I appreciate your continued commitment to your health goals. Please review the care plan we discussed, and feel free to reach out if I can assist you further.  Please note that Annual Wellness Visits do not include a physical exam. Some assessments may be limited, especially if the visit was conducted virtually. If needed, we may recommend an in-person follow-up with your provider.  Ongoing Care Seeing your primary care provider every 3 to 6 months helps us  monitor your health and provide consistent, personalized care.  Consider updating your covid vaccine.  Referrals If a referral was made during today's visit and you haven't received any updates within two weeks, please contact the referred provider directly to check on the status.  Recommended Screenings:  Health Maintenance  Topic Date Due   COVID-19 Vaccine (7 - 2025-26 season) 06/09/2024   Medicare Annual Wellness Visit  11/03/2025   Colon Cancer Screening  10/08/2028   DTaP/Tdap/Td vaccine (2 - Td or Tdap) 10/26/2031   Pneumococcal Vaccine for age over 76  Completed   Flu Shot  Completed   Zoster (Shingles) Vaccine  Completed   Meningitis B Vaccine  Aged Out   Hepatitis C Screening  Discontinued       11/03/2024    1:13 PM  Advanced Directives  Does Patient Have a Medical Advance Directive? No  Would patient like information on creating a medical advance directive? --    Vision: Annual vision screenings are recommended for early detection of glaucoma, cataracts, and diabetic retinopathy. These exams can also reveal signs of chronic conditions such as diabetes and high blood pressure.  Dental: Annual dental screenings help detect early signs of oral cancer, gum disease, and other conditions linked to overall health, including heart disease and diabetes.  Please see the attached documents for additional preventive care recommendations.

## 2024-11-13 ENCOUNTER — Encounter: Payer: Self-pay | Admitting: Pulmonary Disease

## 2024-11-13 ENCOUNTER — Ambulatory Visit: Admitting: Pulmonary Disease

## 2024-11-13 VITALS — BP 106/76 | HR 60 | Temp 97.6°F | Ht 68.5 in | Wt 157.0 lb

## 2024-11-13 DIAGNOSIS — Z9109 Other allergy status, other than to drugs and biological substances: Secondary | ICD-10-CM

## 2024-11-13 DIAGNOSIS — J8283 Eosinophilic asthma: Secondary | ICD-10-CM

## 2024-11-13 DIAGNOSIS — J4550 Severe persistent asthma, uncomplicated: Secondary | ICD-10-CM

## 2024-11-13 DIAGNOSIS — R0602 Shortness of breath: Secondary | ICD-10-CM

## 2024-11-13 LAB — NITRIC OXIDE: Nitric Oxide: 58

## 2024-11-13 NOTE — Patient Instructions (Addendum)
 VISIT SUMMARY:  During your visit, we discussed your asthma symptoms, particularly how they worsen in cold weather. We reviewed your current medications and made adjustments to help manage your symptoms more effectively.  YOUR PLAN:  -ASTHMA: Asthma is a condition where your airways become inflamed and narrow, making it difficult to breathe. Your symptoms are worsened by cold, dry air. We have provided you with a sample of Airsupra to use as an alternative rescue inhaler to manage inflammation more effectively.  Please let us  know how you do with the Airsupra versus the albuterol  alone.  Continue using Breo as prescribed. We will follow up in three months to assess your progress.  INSTRUCTIONS:  Please continue using Breo as prescribed and try the Airsupra sample as your rescue inhaler. We will see you again in three months for a follow-up appointment.

## 2024-11-13 NOTE — Progress Notes (Unsigned)
 "  Subjective:    Patient ID: Jose Perry, male    DOB: January 05, 1941, 84 y.o.   MRN: [id number]  Patient Care Team: Glendia Shad, MD as PCP - General (Internal Medicine) Darliss Rogue, MD as PCP - Cardiology (Cardiology) Tamea Dedra CROME, MD as Consulting Physician (Pulmonary Disease) [place], Teodoro K, MD as Consulting Physician (Gastroenterology)  Chief Complaint  Patient presents with   Asthma    Cough and shortness of breath, worse in cold air.     BACKGROUND/INTERVAL:Patient is an 84 year old lifelong never smoker with medical history as noted below, who presents for follow-up of shortness of breath.  Patient was last seen on 09 September 2024.  He has asthma with eosinophilic and allergic phenotype.  He is on Fasenra .  This is a scheduled follow-up visit.   HPI Discussed the use of AI scribe software for clinical note transcription with the patient, who gave verbal consent to proceed.  History of Present Illness   Jose Perry is an 84 year old male with asthma who presents with increased difficulty breathing in cold weather.  He experiences increased difficulty breathing in cold weather, which he attributes to the dry air exacerbating his asthma symptoms. He does not wear a scarf or mask to warm the air when outside, which may contribute to his symptoms.  He is currently on Breo for asthma management and uses his rescue inhaler approximately once every four to five days. He mentions that he is due for his next Fasenra  shot, though he is unsure of the exact timing due to the long intervals between doses.  He prefers to walk from the parking lot rather than using valet parking, which may also impact his breathing.     DATA/EVENTS 07/11/2018 PFTs: FEV1 0.77 L or 31% predicted, FVC 1.52 L or 47% predicted, FEV1/FVC 51%, there was a 40% net change on FEV1 postbronchodilator.  There was air trapping but no hyperinflation noted on lung volumes.  Consistent with severe  obstructive airways disease with significant bronchodilator response 05/18/2022 coronary CTA: Lung windows nonrevealing. 05/25/2022 chest x-ray PA and lateral: Hyperinflation otherwise no abnormalities. Review of Systems A 10 point review of systems was performed and it is as noted above otherwise negative. 08/03/2022 nitric oxide : 57 ppb 08/25/2022 alpha 1: Phenotype MM, level 124 08/25/2022 IgE,Allergen panel/eosinophils: IgE 325, multiple allergens identified, no eosinophilia 09/12/2022 PFTs: FEV1 1.28 L or 44% predicted, FVC 1.93 L or 47% predicted, FEV1/FVC 66%, no significant bronchodilator response.  Pseudo restriction may be on the basis of air trapping. 06/06/2023 CBC: 900 eosinophils noted. 06/15/2023 nitirc oxide:39 ppb 07/01/2024 Fasenra  initiated 07/30/2023 nitric oxide : 25 ppb 09/13/2023 nitric oxide : 30 ppb 01/18/2024 nitric oxide : 40 ppb 09/09/2024 nitric oxide : 44 ppb  Review of Systems A 10 point review of systems was performed and it is as noted above otherwise negative.   Patient Active Problem List   Diagnosis Date Noted   Hypercholesterolemia 09/30/2024   Eosinophilic asthma 09/10/2024   Elevated CK 06/26/2024   Muscle weakness 06/26/2024   Muscle ache 02/19/2024   Epistaxis 09/17/2023   Swelling of lower extremity 04/13/2023   Allergic rhinitis 02/13/2023   Otitis externa 02/13/2023   Diarrhea 10/30/2021   Iron deficiency 05/05/2020   Hyperplasia of prostate with lower urinary tract symptoms (LUTS) 07/10/2018   Exotropia 07/10/2018   Blepharoconjunctivitis 07/10/2018   Primary open angle glaucoma (POAG) 06/28/2018   B12 deficiency 05/14/2018   Liver hemangioma 01/24/2018   Thrombocytopenia 12/19/2017   Change in  stool 10/12/2017   Severe asthma with acute exacerbation 08/30/2017   Urinary urgency 05/23/2017   Thyroid  nodule 06/13/2016   Primary open angle glaucoma (POAG) of right eye, moderate stage 04/10/2016   Shortness of breath 08/30/2015    Bruit 06/14/2015   Health care maintenance 12/13/2014   Glaucoma 09/21/2014   Goiter 06/28/2014   Hypertension 06/28/2014   Leukopenia 06/28/2014   History of colonic polyps 06/28/2014   Enlarged prostate 06/28/2014   GERD (gastroesophageal reflux disease) 06/28/2014   History of adenomatous polyp of colon 06/26/2014   Gastrointestinal ulcer due to Helicobacter pylori 06/26/2014   Male erectile dysfunction, unspecified 07/24/2012   Incomplete emptying of bladder 07/24/2012   Elevated prostate specific antigen (PSA) 07/24/2012   Benign localized hyperplasia of prostate with urinary obstruction 07/24/2012    Social History   Tobacco Use   Smoking status: Never   Smokeless tobacco: Never  Substance Use Topics   Alcohol use: No    Alcohol/week: 0.0 standard drinks of alcohol    Allergies[1]  Active Medications[2]  Immunization History  Administered Date(s) Administered   Fluad Quad(high Dose 65+) 08/27/2020, 07/11/2021, 06/16/2022   INFLUENZA, HIGH DOSE SEASONAL PF 06/13/2016, 08/20/2017, 07/31/2018, 08/22/2019, 06/15/2023, 07/17/2024   Influenza,inj,Quad PF,6+ Mos 07/20/2015   PFIZER Comirnaty(Gray Top)Covid-19 Tri-Sucrose Vaccine 02/26/2021, 06/28/2022   PFIZER(Purple Top)SARS-COV-2 Vaccination 10/30/2019, 11/20/2019, 07/05/2020   Pfizer Covid-19 Vaccine Bivalent Booster 47yrs & up 07/01/2021   Pneumococcal Conjugate-13 06/10/2015   Pneumococcal Polysaccharide-23 06/30/2013   Respiratory Syncytial Virus Vaccine,Recomb Aduvanted(Arexvy) 10/07/2022   Tdap 10/25/2021   Zoster Recombinant(Shingrix) 07/06/2018, 08/15/2021, 10/25/2021   Zoster, Live 06/30/2013        Objective:     Vitals:   11/13/24 1403  BP: 106/76  Pulse: 60  Temp: 97.6 F (36.4 C)  Height: 5' 8.5 (1.74 m)  Weight: 157 lb (71.2 kg)  SpO2: 99%  TempSrc: Temporal  BMI (Calculated): 23.52     SpO2: 99 %  GENERAL: Thin, fit appearing gentleman, looks younger than stated age, fully  ambulatory, no conversational dyspnea, no respiratory distress. HEAD: Normocephalic, atraumatic.  EYES: Pupils equal, round, reactive to light.  No scleral icterus.  Strabismus with exotropia of right eye. MOUTH: Dentition intact, oral mucosa moist.  No thrush. NECK: Supple. No thyromegaly. Trachea midline. No JVD.  No adenopathy. PULMONARY: Good air entry bilaterally.  No adventitious sounds. CARDIOVASCULAR: S1 and S2. Regular rate and rhythm.  No rubs, murmurs or gallops heard. ABDOMEN: Benign. MUSCULOSKELETAL: No joint deformity, no clubbing, no edema.  NEUROLOGIC: No overt focal deficit, no gait disturbance, speech is fluent. SKIN: Intact,warm,dry. PSYCH: Mood and behavior normal.  Lab Results  Component Value Date   NITRICOXIDE 58 11/13/2024  This result suggests high (>/= 50) Type 2 (T2) airway inflammation. This supports inhaled corticosteroid responsiveness and potential eligibility for biologic therapies targeting Type 2 inflammation.       Assessment & Plan:     ICD-10-CM   1. Severe persistent asthma without complication (HCC)  J45.50 Nitric oxide     2. Eosinophilic asthma  G17.16     3. Environmental allergies  Z91.09       Orders Placed This Encounter  Procedures   Nitric oxide    Discussion:    Severe persistent asthma triggered by environmental factors  Exacerbated by cold, dry air. Increased inflammation likely due to environmental factors. No wheezing on examination. Current treatment includes Breo and rescue inhaler use approximately once every four to five days. Difficulty increasing Breo dosage previously noted. -  Provided sample of Airsupra as an alternative rescue inhaler to manage inflammation more effectively. - Continue Breo as prescribed. - Scheduled follow-up appointment in three months.      Advised if symptoms do not improve or worsen, to please contact office for sooner follow up or seek emergency care.    I spent 32 minutes of dedicated to  the care of this patient on the date of this encounter to include pre-visit review of records, face-to-face time with the patient discussing conditions above, post visit ordering of testing, clinical documentation with the electronic health record, making appropriate referrals as documented, and communicating necessary findings to members of the patients care team.     C. Leita Sanders, MD Advanced Bronchoscopy P[facility]    *This note was generated using voice recognition software/Dragon and/or AI transcription program.  Despite best efforts to proofread, errors can occur which can change the meaning. Any transcriptional errors that result from this process are unintentional and may not be fully corrected at the time of dictation.     [1]  Allergies Allergen Reactions   Brimonidine     Other reaction(s): Eye Redness   Brimonidine Tartrate     Other reaction(s): Eye redness   Brinzolamide-Brimonidine     Other reaction(s): Eye redness   Other Other (See Comments)    Simbrinza Eye Drops gives pt Eye redness  [2]  Current Meds  Medication Sig   albuterol  (PROVENTIL ) (2.5 MG/3ML) 0.083% nebulizer solution Take 3 mLs (2.5 mg total) by nebulization every 6 (six) hours as needed for wheezing or shortness of breath.   albuterol  (VENTOLIN  HFA) 108 (90 Base) MCG/ACT inhaler INHALE 2 PUFFS INTO THE LUNGS EVERY 4 HOURS AS NEEDED FOR WHEEZING OR SHORTNESS OF BREATH.   amLODipine  (NORVASC ) 5 MG tablet TAKE 1 TABLET BY MOUTH TWICE A DAY (Patient taking differently: Take 5 mg by mouth daily.)   ASPIRIN  81 PO Take by mouth.   benralizumab  (FASENRA  PEN) 30 MG/ML prefilled autoinjector Inject 30mg  in the skin at week 4 on 07/29/24 and week 8 on 08/26/24, then every 8 weeks thereafter.   finasteride  (PROSCAR ) 5 MG tablet TAKE 1 TABLET (5 MG TOTAL) BY MOUTH DAILY.   fluticasone  furoate-vilanterol (BREO ELLIPTA ) 100-25 MCG/ACT AEPB Inhale 1 puff into the lungs daily.    hydrALAZINE  (APRESOLINE ) 10 MG tablet One tablet in am and one tablet midday prn and two tablets in pm.   latanoprost (XALATAN) 0.005 % ophthalmic solution ADMINISTER 1 DROP TO THE RIGHT EYE NIGHTLY.   omeprazole  (PRILOSEC) 40 MG capsule TAKE 1 CAPSULE (40 MG TOTAL) BY MOUTH DAILY.   rosuvastatin  (CRESTOR ) 20 MG tablet Take 1 tablet (20 mg total) by mouth daily.   tamsulosin  (FLOMAX ) 0.4 MG CAPS capsule TAKE 1 CAPSULE BY MOUTH EVERY DAY   timolol  (TIMOPTIC ) 0.5 % ophthalmic solution Place 1 drop into the right eye 2 (two) times daily.   valsartan  (DIOVAN ) 320 MG tablet Take 1 tablet (320 mg total) by mouth every evening.   vitamin B-12 (CYANOCOBALAMIN ) 1000 MCG tablet Take by mouth.   "

## 2024-11-14 ENCOUNTER — Encounter: Payer: Self-pay | Admitting: Internal Medicine

## 2024-11-14 ENCOUNTER — Encounter: Payer: Self-pay | Admitting: Pulmonary Disease

## 2024-11-14 DIAGNOSIS — J45909 Unspecified asthma, uncomplicated: Secondary | ICD-10-CM | POA: Insufficient documentation

## 2024-12-31 ENCOUNTER — Other Ambulatory Visit

## 2025-01-05 ENCOUNTER — Ambulatory Visit: Admitting: Internal Medicine

## 2025-02-10 ENCOUNTER — Ambulatory Visit: Admitting: Pulmonary Disease

## 2025-08-07 ENCOUNTER — Ambulatory Visit: Admitting: Urology

## 2025-11-04 ENCOUNTER — Ambulatory Visit
